# Patient Record
Sex: Female | Born: 1935
Health system: Southern US, Community
[De-identification: ages and names within clinical notes are randomized; demographics above are authoritative.]

## PROBLEM LIST (undated history)

## (undated) ENCOUNTER — Emergency Department (HOSPITAL_COMMUNITY): Payer: Medicare Other | Source: Home / Self Care

## (undated) DIAGNOSIS — N183 Chronic kidney disease, stage 3 unspecified: Secondary | ICD-10-CM

## (undated) DIAGNOSIS — I10 Essential (primary) hypertension: Secondary | ICD-10-CM

## (undated) DIAGNOSIS — I6523 Occlusion and stenosis of bilateral carotid arteries: Secondary | ICD-10-CM

## (undated) DIAGNOSIS — G3184 Mild cognitive impairment, so stated: Secondary | ICD-10-CM

## (undated) DIAGNOSIS — I2541 Coronary artery aneurysm: Secondary | ICD-10-CM

## (undated) DIAGNOSIS — I495 Sick sinus syndrome: Secondary | ICD-10-CM

## (undated) DIAGNOSIS — I6529 Occlusion and stenosis of unspecified carotid artery: Secondary | ICD-10-CM

## (undated) DIAGNOSIS — E039 Hypothyroidism, unspecified: Secondary | ICD-10-CM

## (undated) DIAGNOSIS — I739 Peripheral vascular disease, unspecified: Secondary | ICD-10-CM

## (undated) DIAGNOSIS — Z9889 Other specified postprocedural states: Secondary | ICD-10-CM

## (undated) DIAGNOSIS — Z953 Presence of xenogenic heart valve: Secondary | ICD-10-CM

## (undated) DIAGNOSIS — Z950 Presence of cardiac pacemaker: Secondary | ICD-10-CM

## (undated) DIAGNOSIS — I129 Hypertensive chronic kidney disease with stage 1 through stage 4 chronic kidney disease, or unspecified chronic kidney disease: Secondary | ICD-10-CM

## (undated) DIAGNOSIS — J4520 Mild intermittent asthma, uncomplicated: Secondary | ICD-10-CM

## (undated) DIAGNOSIS — E785 Hyperlipidemia, unspecified: Secondary | ICD-10-CM

## (undated) DIAGNOSIS — Z45010 Encounter for checking and testing of cardiac pacemaker pulse generator [battery]: Secondary | ICD-10-CM

## (undated) DIAGNOSIS — Z8679 Personal history of other diseases of the circulatory system: Secondary | ICD-10-CM

## (undated) HISTORY — DX: Encounter for checking and testing of cardiac pacemaker pulse generator (battery): Z45.010

## (undated) HISTORY — DX: Mild cognitive impairment, so stated: G31.84

## (undated) HISTORY — DX: Hypothyroidism, unspecified: E03.9

## (undated) HISTORY — DX: Sick sinus syndrome: I49.5

## (undated) HISTORY — DX: Chronic kidney disease, stage 3 (moderate): N18.3

## (undated) HISTORY — DX: Presence of cardiac pacemaker: Z95.0

## (undated) HISTORY — DX: Other specified postprocedural states: Z98.890

## (undated) HISTORY — DX: Personal history of other diseases of the circulatory system: Z86.79

## (undated) HISTORY — DX: Hypertensive chronic kidney disease with stage 1 through stage 4 chronic kidney disease, or unspecified chronic kidney disease: I12.9

## (undated) HISTORY — DX: Coronary artery aneurysm: I25.41

## (undated) HISTORY — DX: Peripheral vascular disease, unspecified: I73.9

## (undated) HISTORY — DX: Hyperlipidemia, unspecified: E78.5

## (undated) HISTORY — DX: Occlusion and stenosis of unspecified carotid artery: I65.29

## (undated) HISTORY — DX: Occlusion and stenosis of bilateral carotid arteries: I65.23

## (undated) HISTORY — DX: Presence of xenogenic heart valve: Z95.3

## (undated) HISTORY — DX: Mild intermittent asthma, uncomplicated: J45.20

## (undated) HISTORY — DX: Chronic kidney disease, stage 3 unspecified: N18.30

---
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
## 1957-02-13 HISTORY — PX: KIDNEY STONE SURGERY: SHX686

## 1964-02-14 HISTORY — PX: ABDOMINAL HYSTERECTOMY: SHX81

## 1987-12-15 HISTORY — PX: BREAST FIBROADENOMA SURGERY: SHX580

## 1997-08-19 ENCOUNTER — Ambulatory Visit (HOSPITAL_COMMUNITY): Admission: RE | Admit: 1997-08-19 | Discharge: 1997-08-19 | Payer: Self-pay | Admitting: Cardiology

## 1997-08-27 ENCOUNTER — Ambulatory Visit (HOSPITAL_COMMUNITY): Admission: RE | Admit: 1997-08-27 | Discharge: 1997-08-27 | Payer: Self-pay | Admitting: Cardiothoracic Surgery

## 1997-08-28 ENCOUNTER — Ambulatory Visit (HOSPITAL_COMMUNITY): Admission: RE | Admit: 1997-08-28 | Discharge: 1997-08-28 | Payer: Self-pay | Admitting: Cardiothoracic Surgery

## 1997-09-22 HISTORY — PX: CORONARY ARTERY BYPASS GRAFT: SHX141

## 1997-09-22 HISTORY — PX: CARDIAC SURGERY: SHX584

## 1997-09-22 HISTORY — PX: ASCENDING AORTIC ANEURYSM REPAIR: SHX1191

## 1999-09-11 ENCOUNTER — Emergency Department (HOSPITAL_COMMUNITY): Admission: EM | Admit: 1999-09-11 | Discharge: 1999-09-12 | Payer: Self-pay | Admitting: Emergency Medicine

## 1999-09-11 ENCOUNTER — Encounter: Payer: Self-pay | Admitting: Emergency Medicine

## 2001-08-26 ENCOUNTER — Encounter: Payer: Self-pay | Admitting: Cardiology

## 2001-08-26 ENCOUNTER — Ambulatory Visit (HOSPITAL_COMMUNITY): Admission: RE | Admit: 2001-08-26 | Discharge: 2001-08-27 | Payer: Self-pay | Admitting: Cardiology

## 2001-08-26 DIAGNOSIS — Z950 Presence of cardiac pacemaker: Secondary | ICD-10-CM

## 2001-08-26 HISTORY — DX: Presence of cardiac pacemaker: Z95.0

## 2001-08-26 HISTORY — PX: PACEMAKER INSERTION: SHX728

## 2001-08-27 ENCOUNTER — Encounter: Payer: Self-pay | Admitting: Cardiology

## 2002-03-25 ENCOUNTER — Ambulatory Visit (HOSPITAL_COMMUNITY): Admission: RE | Admit: 2002-03-25 | Discharge: 2002-03-25 | Payer: Self-pay | Admitting: Internal Medicine

## 2002-03-25 ENCOUNTER — Encounter: Payer: Self-pay | Admitting: Internal Medicine

## 2002-12-22 ENCOUNTER — Emergency Department (HOSPITAL_COMMUNITY): Admission: EM | Admit: 2002-12-22 | Discharge: 2002-12-22 | Payer: Self-pay | Admitting: Emergency Medicine

## 2002-12-27 ENCOUNTER — Emergency Department (HOSPITAL_COMMUNITY): Admission: EM | Admit: 2002-12-27 | Discharge: 2002-12-27 | Payer: Self-pay | Admitting: Emergency Medicine

## 2005-07-29 ENCOUNTER — Emergency Department (HOSPITAL_COMMUNITY): Admission: EM | Admit: 2005-07-29 | Discharge: 2005-07-30 | Payer: Self-pay | Admitting: Emergency Medicine

## 2008-04-28 DIAGNOSIS — I6529 Occlusion and stenosis of unspecified carotid artery: Secondary | ICD-10-CM

## 2008-04-28 HISTORY — DX: Occlusion and stenosis of unspecified carotid artery: I65.29

## 2008-05-05 ENCOUNTER — Encounter: Admission: RE | Admit: 2008-05-05 | Discharge: 2008-05-05 | Payer: Self-pay | Admitting: Internal Medicine

## 2008-11-18 ENCOUNTER — Ambulatory Visit (HOSPITAL_COMMUNITY): Admission: RE | Admit: 2008-11-18 | Discharge: 2008-11-18 | Payer: Self-pay | Admitting: Cardiology

## 2008-11-18 DIAGNOSIS — Z45010 Encounter for checking and testing of cardiac pacemaker pulse generator [battery]: Secondary | ICD-10-CM

## 2008-11-18 HISTORY — PX: PACEMAKER GENERATOR CHANGE: SHX5998

## 2008-11-18 HISTORY — DX: Encounter for checking and testing of cardiac pacemaker pulse generator (battery): Z45.010

## 2010-03-07 ENCOUNTER — Ambulatory Visit (HOSPITAL_COMMUNITY)
Admission: RE | Admit: 2010-03-07 | Discharge: 2010-03-08 | Payer: Self-pay | Source: Home / Self Care | Attending: Surgery | Admitting: Surgery

## 2010-03-07 LAB — COMPREHENSIVE METABOLIC PANEL
ALT: 13 U/L (ref 0–35)
AST: 17 U/L (ref 0–37)
Albumin: 3.4 g/dL — ABNORMAL LOW (ref 3.5–5.2)
Alkaline Phosphatase: 70 U/L (ref 39–117)
BUN: 24 mg/dL — ABNORMAL HIGH (ref 6–23)
CO2: 26 mEq/L (ref 19–32)
Calcium: 9.4 mg/dL (ref 8.4–10.5)
Chloride: 111 mEq/L (ref 96–112)
Creatinine, Ser: 1.5 mg/dL — ABNORMAL HIGH (ref 0.4–1.2)
GFR calc Af Amer: 40 mL/min — ABNORMAL LOW (ref >60)
GFR calc non Af Amer: 33 mL/min — ABNORMAL LOW (ref >60)
Glucose, Bld: 108 mg/dL — ABNORMAL HIGH (ref 70–99)
Potassium: 5 mEq/L (ref 3.5–5.1)
Sodium: 142 mEq/L (ref 135–145)
Total Bilirubin: 0.5 mg/dL (ref 0.3–1.2)
Total Protein: 6.4 g/dL (ref 6.0–8.3)

## 2010-03-07 LAB — SURGICAL PCR SCREEN
MRSA, PCR: NEGATIVE
Staphylococcus aureus: NEGATIVE

## 2010-03-07 LAB — DIFFERENTIAL
Basophils Absolute: 0 10*3/uL (ref 0.0–0.1)
Basophils Relative: 0 % (ref 0–1)
Eosinophils Absolute: 0 10*3/uL (ref 0.0–0.7)
Eosinophils Relative: 1 % (ref 0–5)
Lymphocytes Relative: 28 % (ref 12–46)
Lymphs Abs: 2 10*3/uL (ref 0.7–4.0)
Monocytes Absolute: 1 10*3/uL (ref 0.1–1.0)
Monocytes Relative: 14 % — ABNORMAL HIGH (ref 3–12)
Neutro Abs: 4 10*3/uL (ref 1.7–7.7)
Neutrophils Relative %: 57 % (ref 43–77)

## 2010-03-07 LAB — CBC
HCT: 34.3 % — ABNORMAL LOW (ref 36.0–46.0)
Hemoglobin: 10.8 g/dL — ABNORMAL LOW (ref 12.0–15.0)
MCH: 30 pg (ref 26.0–34.0)
MCHC: 31.5 g/dL (ref 30.0–36.0)
MCV: 95.3 fL (ref 78.0–100.0)
Platelets: 198 10*3/uL (ref 150–400)
RBC: 3.6 MIL/uL — ABNORMAL LOW (ref 3.87–5.11)
RDW: 13.2 % (ref 11.5–15.5)
WBC: 7 10*3/uL (ref 4.0–10.5)

## 2010-03-11 NOTE — Discharge Summary (Addendum)
  Holly Bolton, Holly Bolton                 ACCOUNT NO.:  [id number]  MEDICAL RECORD NO.:  [id number]          PATIENT TYPE:  INP  LOCATION:  5121                         FACILITY:  [facility]  PHYSICIAN:  Fenton Malling. Lucia Gaskins, M.D.  DATE OF BIRTH:  1922-07-06  DATE OF ADMISSION:  03/07/2010 DATE OF DISCHARGE:  08 Mar 2010                              DISCHARGE SUMMARY  DISCHARGE DIAGNOSES: 1. Right femoral hernia. 2. History of prior heart surgery with a pacemaker. 3. Hypertension. 4. On Ativan. 5. History of some dizziness.  OPERATIONS PERFORMED:  The patient had open repair of right femoral hernia on March 07, 2010 by Dr. Keturah Barre. Vella Colquitt.  HISTORY OF ILLNESS:  Ms. Stringfield is an 77 year old white female, patient of Dr. Jani Gravel.  From a cardiac standpoint, she was followed by Dr. Roe Rutherford until he retired and now seeing Dr. Elisabeth Cara of [facility].  She has had, I think, a mitral valve replacement in 1999, has had pacemakers twice. She has felt over the last several months some right groin pain with a bulge in the groin.  I saw her for this mass  which is  a right inguinal/femoral hernia.  On the day of admission, she was taken to the operating room where she underwent an opne right femoral hernia repair.  Because of her age and history of cardiac issues, I kept her overnight for observation, in which she did well.  She is now 1 day postop, eating, and ready for discharge.  DISCHARGE INSTRUCTIONS:   No driving for about 5 days.   She has no restrictions for her diet.   She can start showering tomorrow.   She is to call my office, and I would like to see her back in 2-3 weeks.   I am going to send her home with some Vicodin for pain, and she can resume her home medications which included Bystolic, then she has some eyedrops, some Tylenol, Benicar 40 mg daily, and then she takes lorazepam as needed.  Her discharge condition is good.    Fenton Malling. Lucia Gaskins, M.D.,  FACS   DHN/MEDQ  D:  03/08/2010  T:  03/08/2010  Job:  [id number]  cc:   Arlyss Repress, MD Tery Sanfilippo, MD  Electronically Signed by Alphonsa Overall M.D. on 03/11/2010 09:13:30 AM

## 2010-03-11 NOTE — Op Note (Addendum)
  NAMECHAMIRA, GOEGLEIN                 ACCOUNT NO.:  [id number]  MEDICAL RECORD NO.:  [id number]          PATIENT TYPE:  INP  LOCATION:  2550                         FACILITY:  [facility]  PHYSICIAN:  Fenton Malling. Lucia Gaskins, M.D.  DATE OF BIRTH:  1922/07/13  DATE OF PROCEDURE:  03/07/2010                              OPERATIVE REPORT   PREOPERATIVE DIAGNOSIS:  Right inguinal hernia.  POSTOPERATIVE DIAGNOSIS:  Right femoral hernia.  PROCEDURE:  Open repair of right femoral hernia with mesh.  SURGEON:  Fenton Malling. Lucia Gaskins, MD  FIRST ASSISTANT:  None.  ANESTHESIA:  General LMA with 30 mL of 0.25% Marcaine.  COMPLICATIONS:  None.  INDICATION FOR PROCEDURE:  Ms. Sittner is an 77 year old white female who sees Dr. Jani Gravel as her primary medical doctor, has seen Dr. Elisabeth Cara from [facility] from cardiac standpoint.  She has had increasing discomfort in the right groin with a mass that she has noticed for 6 months to a year.  This is consistent with a right inguinal hernia.  I discussed with her about repairing this hernia.  Discussed the indications and potential complications.  The potential complications include, but are not limited to, bleeding, infection, nerve injury, and recurrence of the hernia.  OPERATIVE NOTE:  The patient was placed in the supine position, given a general LMA anesthesia in room #2 at [facility].  Her abdomen was prepped with ChloraPrep, she was shaved and sterilely draped.  A time- out was held and the surgical checklist run.  I went through a right inguinal incision with sharp dissection and opened the external oblique fascia.  She did not have much of a round ligament to remove, but what she did have was a femoral hernia that protruded inferiorly through the pubo-iliac tract.  This hernia was probably protruded about 4-5 cm.  I reduced this hernia and imbricated it using 2-0 Vicryl suture.  I then carried out inguinal floor repair with a piece of  Ultrapro mesh that covers the left groin area.  I sewed this in place with interrupted 0 Novafil sutures.  The mesh was sewn to the pubic tubercle medially, the shelving edge  of the inguinal ligament inferiorly, and the internal oblique fascia superiorly.  The mesh lay flat.  The hernia defect was covered.  I then closed the external oblique fascia with interrupted 3-0 Vicryl suture.  I infiltrated the deep fascial layers, the superficial fascial layers, the subcutaneous layers with about 30 mL of 0.25% Marcaine.  I closed the subcutaneous tissues with 3-0 Vicryl sutures, the skin with a 5-0 Vicryl suture, painted with tincture, Benzoin, and steri-stripped it.  The patient tolerated the procedure well, was transported to the recovery room in good condition.  Sponge and needle counts were correct at the end of the case.   Fenton Malling. Lucia Gaskins, M.D., FACS   DHN/MEDQ  D:  03/07/2010  T:  03/07/2010  Job:  [id number]  cc:   Arlyss Repress, MD Tery Sanfilippo, MD  Electronically Signed by Alphonsa Overall M.D. on 03/11/2010 09:10:23 AM

## 2010-05-19 LAB — CBC
HCT: 31.6 % — ABNORMAL LOW (ref 36.0–46.0)
Hemoglobin: 10.8 g/dL — ABNORMAL LOW (ref 12.0–15.0)
MCHC: 34 g/dL (ref 30.0–36.0)
MCV: 95.9 fL (ref 78.0–100.0)
Platelets: 164 10*3/uL (ref 150–400)
RBC: 3.3 MIL/uL — ABNORMAL LOW (ref 3.87–5.11)
RDW: 13.8 % (ref 11.5–15.5)
WBC: 5.5 10*3/uL (ref 4.0–10.5)

## 2010-05-19 LAB — BASIC METABOLIC PANEL
BUN: 18 mg/dL (ref 6–23)
CO2: 26 mEq/L (ref 19–32)
Calcium: 8.7 mg/dL (ref 8.4–10.5)
Chloride: 109 mEq/L (ref 96–112)
Creatinine, Ser: 1.42 mg/dL — ABNORMAL HIGH (ref 0.4–1.2)
GFR calc Af Amer: 43 mL/min — ABNORMAL LOW (ref >60)
GFR calc non Af Amer: 35 mL/min — ABNORMAL LOW (ref >60)
Glucose, Bld: 101 mg/dL — ABNORMAL HIGH (ref 70–99)
Potassium: 4.2 mEq/L (ref 3.5–5.1)
Sodium: 141 mEq/L (ref 135–145)

## 2010-05-19 LAB — PROTIME-INR
INR: 1.14 (ref 0.00–1.49)
Prothrombin Time: 14.5 seconds (ref 11.6–15.2)

## 2010-05-19 LAB — APTT: aPTT: 30 seconds (ref 24–37)

## 2010-07-01 NOTE — Procedures (Signed)
[place]  Patient:    Holly Bolton, Holly Bolton Visit Number: [id number] MRN: [id number]          Service Type: CAT Location: 2000 2011 01 Attending Physician:  Lisbeth Renshaw Dictated by:   Minette Brine. Glade Lloyd, M.D. Proc. Date: 08/26/01 Admit Date:  08/26/2001 Discharge Date: 08/27/2001   CC:         Kyung Rudd C. Francina Ames., M.D.  Cardiac Catheterization Laboratory   Procedure Report  REFERRING PHYSICIAN: Theodoro Parma. Francina Ames., M.D.  PROCEDURE: Insertion of dual-chamber permanent transvenous pacemaker, DDD mode.  INDICATIONS FOR PROCEDURE: Sick sinus syndrome with tachycardia-bradycardia syndrome and syncope, pauses greater than 5 seconds.  DESCRIPTION OF PROCEDURE: After signing an informed consent, the patient was premedicated with 50 mg of Benadryl intravenously and brought to the cardiac catheterization lab.  Her right anterior chest and base of neck were prepped and draped in a sterile fashion and a right transverse subclavicular plane was anesthetized locally with 1% lidocaine.  An incision was made in this anesthetized plane with the incision being deepened into the fascial layer overlying the pectoralis muscle.  A pocket was then formed in this facial layer for later insertion of the pulse generator. Two 8 French Cook introducer sheaths were inserted percutaneously into the right subclavian vein.  A [employer] ventricular pacing lead was selected 1336T, serial #[id number] and after proper preparation inserted through the first [facility] introducer sheath. A second St. Jude lead was then selected. A screw-in lead for the atrial placement, model #1488T, serial [id number]. After properly preparing the screw-in lead, it was inserted through the second 8 French sheath in the usual fashion. Both sheaths were removed in the usual fashion. The ventricular lead was then advanced into the right atrium and right ventricle where the time  was positioned in the apex of the right ventricle. Very good pacing parameters were obtained with a minimum voltage threshold of 0.5 volts utilizing 0.9 mA of current. The resistance measured 514 ohms and the R wave sensitivity measured 5.4 mV. After obtaining these pacing parameters in the ventricle, the screw-in lead was advanced into the right atrium and a J-tip guide wire was inserted into this lead and the tip was then positioned medially in the right atrium. The appendage was no longer present as a consequence of her coronary artery bypass graft surgery. The lead was secured properly with the screw-in mechanism and good pacing parameters were obtained with a minimum voltage threshold of 1.2 volts utilizing 3.0 mA of current. The resistance measured 491 ohms, and the P wave sensitivity measured 2.5 mV. After obtaining these pacing parameters, both leads were secured properly at their insertion site. The wound was lavaged profusely with the kanamycin solution. We then selected a Medtronic pulse generator, model Kappa, model [id number], serial [id number]. After properly analyzing the pulse generator, it was attached to the pacing electrodes in the usual fashion. The pulse generator was then placed within the previously formed pocket and the wound was closed in layers using 2-0 Dexon. Final skin closure was obtained with a cutaneous layer of Steri-Strips. The patient tolerated the procedure well and no complications were noted. At the end of the procedure, a sterile bulky dressing was applied to the wound and she was returned to her wound in satisfactory condition.  MEDICATIONS GIVEN: None. The pacemaker was noted to be functioning normally the DDD mode. Dictated by:   Minette Brine. Glade Lloyd, M.D. Attending Physician:  Lisbeth Renshaw DD:  08/26/01 TD:  08/28/01 Job: [id number]

## 2011-02-23 DIAGNOSIS — E559 Vitamin D deficiency, unspecified: Secondary | ICD-10-CM | POA: Diagnosis not present

## 2011-03-02 DIAGNOSIS — I495 Sick sinus syndrome: Secondary | ICD-10-CM | POA: Diagnosis not present

## 2011-03-02 DIAGNOSIS — I10 Essential (primary) hypertension: Secondary | ICD-10-CM | POA: Diagnosis not present

## 2011-03-02 DIAGNOSIS — R079 Chest pain, unspecified: Secondary | ICD-10-CM | POA: Diagnosis not present

## 2011-03-16 DIAGNOSIS — I2510 Atherosclerotic heart disease of native coronary artery without angina pectoris: Secondary | ICD-10-CM | POA: Diagnosis not present

## 2011-03-30 DIAGNOSIS — I10 Essential (primary) hypertension: Secondary | ICD-10-CM | POA: Diagnosis not present

## 2011-04-12 DIAGNOSIS — Z45018 Encounter for adjustment and management of other part of cardiac pacemaker: Secondary | ICD-10-CM | POA: Diagnosis not present

## 2011-05-31 DIAGNOSIS — H40019 Open angle with borderline findings, low risk, unspecified eye: Secondary | ICD-10-CM | POA: Diagnosis not present

## 2011-06-01 ENCOUNTER — Emergency Department (HOSPITAL_COMMUNITY): Payer: Medicare Other

## 2011-06-01 ENCOUNTER — Encounter (HOSPITAL_COMMUNITY): Payer: Self-pay | Admitting: Emergency Medicine

## 2011-06-01 ENCOUNTER — Emergency Department (HOSPITAL_COMMUNITY)
Admission: EM | Admit: 2011-06-01 | Discharge: 2011-06-01 | Disposition: A | Discharge status: Home / Self Care | Payer: Medicare Other | Attending: Emergency Medicine | Admitting: Emergency Medicine

## 2011-06-01 DIAGNOSIS — I10 Essential (primary) hypertension: Secondary | ICD-10-CM | POA: Insufficient documentation

## 2011-06-01 DIAGNOSIS — S1093XA Contusion of unspecified part of neck, initial encounter: Secondary | ICD-10-CM | POA: Insufficient documentation

## 2011-06-01 DIAGNOSIS — R51 Headache: Secondary | ICD-10-CM | POA: Diagnosis not present

## 2011-06-01 DIAGNOSIS — S61409A Unspecified open wound of unspecified hand, initial encounter: Secondary | ICD-10-CM | POA: Insufficient documentation

## 2011-06-01 DIAGNOSIS — S0003XA Contusion of scalp, initial encounter: Secondary | ICD-10-CM | POA: Insufficient documentation

## 2011-06-01 DIAGNOSIS — Y9229 Other specified public building as the place of occurrence of the external cause: Secondary | ICD-10-CM | POA: Insufficient documentation

## 2011-06-01 DIAGNOSIS — W1809XA Striking against other object with subsequent fall, initial encounter: Secondary | ICD-10-CM | POA: Insufficient documentation

## 2011-06-01 DIAGNOSIS — M25449 Effusion, unspecified hand: Secondary | ICD-10-CM | POA: Diagnosis not present

## 2011-06-01 DIAGNOSIS — S60222A Contusion of left hand, initial encounter: Secondary | ICD-10-CM

## 2011-06-01 DIAGNOSIS — S41009A Unspecified open wound of unspecified shoulder, initial encounter: Secondary | ICD-10-CM | POA: Diagnosis not present

## 2011-06-01 DIAGNOSIS — T148XXA Other injury of unspecified body region, initial encounter: Secondary | ICD-10-CM | POA: Diagnosis not present

## 2011-06-01 DIAGNOSIS — M79609 Pain in unspecified limb: Secondary | ICD-10-CM | POA: Diagnosis not present

## 2011-06-01 DIAGNOSIS — S60229A Contusion of unspecified hand, initial encounter: Secondary | ICD-10-CM | POA: Insufficient documentation

## 2011-06-01 DIAGNOSIS — S0083XA Contusion of other part of head, initial encounter: Secondary | ICD-10-CM | POA: Diagnosis not present

## 2011-06-01 DIAGNOSIS — J45909 Unspecified asthma, uncomplicated: Secondary | ICD-10-CM | POA: Diagnosis not present

## 2011-06-01 DIAGNOSIS — G319 Degenerative disease of nervous system, unspecified: Secondary | ICD-10-CM | POA: Diagnosis not present

## 2011-06-01 DIAGNOSIS — I6789 Other cerebrovascular disease: Secondary | ICD-10-CM | POA: Diagnosis not present

## 2011-06-01 DIAGNOSIS — M7989 Other specified soft tissue disorders: Secondary | ICD-10-CM | POA: Diagnosis not present

## 2011-06-01 DIAGNOSIS — S0093XA Contusion of unspecified part of head, initial encounter: Secondary | ICD-10-CM

## 2011-06-01 DIAGNOSIS — M25539 Pain in unspecified wrist: Secondary | ICD-10-CM | POA: Diagnosis not present

## 2011-06-01 DIAGNOSIS — S6990XA Unspecified injury of unspecified wrist, hand and finger(s), initial encounter: Secondary | ICD-10-CM | POA: Diagnosis not present

## 2011-06-01 DIAGNOSIS — I6350 Cerebral infarction due to unspecified occlusion or stenosis of unspecified cerebral artery: Secondary | ICD-10-CM | POA: Diagnosis not present

## 2011-06-01 HISTORY — DX: Essential (primary) hypertension: I10

## 2011-06-01 NOTE — ED Provider Notes (Addendum)
History     CSN: [id number]  Arrival date & time 06/01/11  1700   First MD Initiated Contact with Patient 06/01/11 1710      Chief Complaint  Patient presents with  . Fall    (Consider location/radiation/quality/duration/timing/severity/associated sxs/prior treatment) HPI Comments: Patient presents after a fall at the [employer] car she started.  Patient was reaching in the freezer to get a package of [employer]ond[employer] bars and lost her balance and hit her head and her left hand.  She's been ambulating since that time.  She had no loss of consciousness.  Patient is on an aspirin but no other antiplatelet or anticoagulation agents.  She notes only mild pain at this time and does not wish to have even Tylenol for pain.  Her tetanus immunization is up-to-date.  Her primary pain is in her left hand where she does have a skin tear.  No chest pain or shortness of breath.  No palpitations.  No lightheadedness or dizziness.  Patient is a 78 y.o. female presenting with fall. The history is provided by the patient. No language interpreter was used.  Fall The accident occurred less than 1 hour ago. The fall occurred while walking. She landed on a hard floor. The volume of blood lost was minimal. The pain is mild. She was ambulatory at the scene. Associated symptoms include headaches. Pertinent negatives include no fever, no abdominal pain, no nausea and no vomiting.    Past Medical History  Diagnosis Date  . Hypertension   . Asthma     Past Surgical History  Procedure Date  . Cardiac surgery   . Pacemaker insertion     No family history on file.  History  Substance Use Topics  . Smoking status: Not on file  . Smokeless tobacco: Not on file  . Alcohol Use:     OB History    Grav Para Term Preterm Abortions TAB SAB Ect Mult Living                  Review of Systems  Constitutional: Negative.  Negative for fever and chills.  Eyes: Negative.  Negative for discharge and redness.    Respiratory: Negative.  Negative for cough and shortness of breath.   Cardiovascular: Negative.  Negative for chest pain.  Gastrointestinal: Negative.  Negative for nausea, vomiting, abdominal pain and diarrhea.  Genitourinary: Negative.  Negative for dysuria and vaginal discharge.  Musculoskeletal: Negative for back pain.  Skin: Positive for wound. Negative for color change and rash.  Neurological: Positive for headaches. Negative for syncope.  Hematological: Negative.  Negative for adenopathy.  Psychiatric/Behavioral: Negative.  Negative for confusion.  All other systems reviewed and are negative.    Allergies  Review of patient's allergies indicates no known allergies.  Home Medications  No current outpatient prescriptions on file.  BP 182/68  Pulse 100  Temp 97.7 F (36.5 C)  Resp 20  SpO2 100%  Physical Exam  Nursing note and vitals reviewed. Constitutional: She is oriented to person, place, and time. She appears well-developed and well-nourished.  Non-toxic appearance. She does not have a sickly appearance.  HENT:  Head: Normocephalic and atraumatic.    Eyes: Conjunctivae, EOM and lids are normal. Pupils are equal, round, and reactive to light. No scleral icterus.  Neck: Trachea normal and normal range of motion. Neck supple.  Cardiovascular: Normal rate, regular rhythm and normal heart sounds.   Pulmonary/Chest: Effort normal and breath sounds normal. No respiratory distress. She has  no wheezes. She has no rales. She exhibits no tenderness.  Abdominal: Soft. Normal appearance. There is no tenderness. There is no rebound, no guarding and no CVA tenderness.  Musculoskeletal: Normal range of motion. She exhibits tenderness.       Arms:      Cap refill and left fingertips is less than 2 seconds.  Sensation to light touch is intact.  Neurological: She is alert and oriented to person, place, and time. She has normal strength.  Skin: Skin is warm, dry and intact. No rash  noted.  Psychiatric: She has a normal mood and affect. Her behavior is normal. Judgment and thought content normal.    ED Course  Procedures (including critical care time)  Results for orders placed during the hospital encounter of 03/07/10  DIFFERENTIAL      Component Value Range   Neutrophils Relative 57  43 - 77 (%)   Neutro Abs 4.0  1.7 - 7.7 (K/uL)   Lymphocytes Relative 28  12 - 46 (%)   Lymphs Abs 2.0  0.7 - 4.0 (K/uL)   Monocytes Relative 14 (*) 3 - 12 (%)   Monocytes Absolute 1.0  0.1 - 1.0 (K/uL)   Eosinophils Relative 1  0 - 5 (%)   Eosinophils Absolute 0.0  0.0 - 0.7 (K/uL)   Basophils Relative 0  0 - 1 (%)   Basophils Absolute 0.0  0.0 - 0.1 (K/uL)  CBC      Component Value Range   WBC 7.0  4.0 - 10.5 (K/uL)   RBC 3.60 (*) 3.87 - 5.11 (MIL/uL)   Hemoglobin 10.8 (*) 12.0 - 15.0 (g/dL)   HCT 34.3 (*) 36.0 - 46.0 (%)   MCV 95.3  78.0 - 100.0 (fL)   MCH 30.0  26.0 - 34.0 (pg)   MCHC 31.5  30.0 - 36.0 (g/dL)   RDW 13.2  11.5 - 15.5 (%)   Platelets 198  150 - 400 (K/uL)  COMPREHENSIVE METABOLIC PANEL      Component Value Range   Sodium 142  135 - 145 (mEq/L)   Potassium 5.0  3.5 - 5.1 (mEq/L)   Chloride 111  96 - 112 (mEq/L)   CO2 26  19 - 32 (mEq/L)   Glucose, Bld 108 (*) 70 - 99 (mg/dL)   BUN 24 (*) 6 - 23 (mg/dL)   Creatinine, Ser 1.50 (*) 0.4 - 1.2 (mg/dL)   Calcium 9.4  8.4 - 10.5 (mg/dL)   Total Protein 6.4  6.0 - 8.3 (g/dL)   Albumin 3.4 (*) 3.5 - 5.2 (g/dL)   AST 17  0 - 37 (U/L)   ALT 13  0 - 35 (U/L)   Alkaline Phosphatase 70  39 - 117 (U/L)   Total Bilirubin 0.5  0.3 - 1.2 (mg/dL)   GFR calc non Af Amer 33 (*) >60 (mL/min)   GFR calc Af Amer   (*) >60 (mL/min)   Value: 40            The eGFR has been calculated     using the MDRD equation.     This calculation has not been     validated in all clinical     situations.     eGFR's persistently     <60 mL/min signify     possible Chronic Kidney Disease.  SURGICAL PCR SCREEN      Component  Value Range   MRSA, PCR NEGATIVE  NEGATIVE    Staphylococcus aureus  NEGATIVE    Value: NEGATIVE            The Xpert SA Assay (FDA     approved for NASAL specimens     only), is one component of     a comprehensive surveillance     program.  It is not intended     to diagnose infection nor to     guide or monitor treatment.   Dg Wrist Complete Left  06/01/2011  *RADIOLOGY REPORT*  Clinical Data: Fall.  Bruising and pain along the ulnar side of the wrist.  LEFT WRIST - COMPLETE 3+ VIEW  Comparison: None.  Findings: Mild soft tissue swelling along the ulnar wrist noted, without cortical discontinuity to suggest fracture.  Degenerative spurring at the first carpometacarpal articulation noted.  IMPRESSION:  1.  Mild degenerative findings at the first carpal metacarpal articulation.  No wrist fracture is observed. 2.  Mild ulnar sided soft tissue swelling.  Original Report Authenticated By: Carron Curie, M.D.   Ct Head Wo Contrast  06/01/2011  *RADIOLOGY REPORT*  Clinical Data: Fall.  Small contusion to left forehead.  The patient is on aspirin.  CT HEAD WITHOUT CONTRAST  Technique:  Contiguous axial images were obtained from the base of the skull through the vertex without contrast.  Comparison: CT of 05/05/2008  Findings: Remote infarct in the right frontal lobe is stable. Chronic microvascular ischemic changes and cerebral atrophy are stable.  Remote lacunar infarction in the right caudate head is stable.  The ventricles are normal in size.  Negative for hemorrhage, mass effect, mass lesion, or evidence of acute cortically based infarction.  The visualized paranasal sinuses, mastoid air cells, and middle ears are clear.  The skull is intact.  Soft tissues of the scalp are symmetric.  No discrete hematoma of the scalp is seen.  IMPRESSION:  1.  No acute intracranial abnormality. 2.  Remote right frontal lobe infarction and right caudate head lacunar infarction and chronic microvascular  ischemic changes.  Original Report Authenticated By: Curlene Dolphin, M.D.   Dg Hand Complete Left  06/01/2011  *RADIOLOGY REPORT*  Clinical Data: Fall.  Pain in hand.  LEFT HAND - COMPLETE 3+ VIEW  Comparison: None.  Findings: Osteoarthritis noted.  Soft tissue swelling projects over the dorsal metacarpal phalangeal joints.  No fracture is observed. Possible bone island noted in terminal phalanx of third finger.  IMPRESSION:  1.  Osteoarthritis. 2.  Dorsal soft tissue swelling overlying the metacarpal phalangeal joints.  Original Report Authenticated By: Carron Curie, M.D.     MDM  Patient presents with mechanical fall with no loss of consciousness.  Given patient's elevated age and aspirin use I will obtain a CT head since she has a very small contusion to her left upper for head.  I will obtain a left hand and wrist x-ray due to her pain, bruising and tenderness to palpation.  Her tetanus immunization is up-to-date.  Patient is declining pain medication at this time.  Lezlie Octave, MD 06/01/11 1723  Patient with no acute fractures on her x-rays.  She is in her normal mental status.  She had no loss of consciousness.  No signs of further acute injuries and I believe is safe for discharge home.  Lezlie Octave, MD 06/01/11 1840

## 2011-06-01 NOTE — ED Notes (Signed)
[id number] Expected date:06/01/11<BR> Expected time:<BR> Means of arrival:<BR> Comments:<BR> EMS 40 GC - fall/skin tear

## 2011-06-01 NOTE — Discharge Instructions (Signed)

## 2011-06-01 NOTE — ED Notes (Signed)
Pt. Reports losing her balance at [employer] and fell. Brought by [facility]. Pt. Has skin tear on left hand and bruising around. States pain in a 2 all in her R arm. Pt. Reports hitting her head and knee on the left side. Pt. Takes aspirin.

## 2011-06-30 DIAGNOSIS — Z45018 Encounter for adjustment and management of other part of cardiac pacemaker: Secondary | ICD-10-CM | POA: Diagnosis not present

## 2011-06-30 DIAGNOSIS — I2510 Atherosclerotic heart disease of native coronary artery without angina pectoris: Secondary | ICD-10-CM | POA: Diagnosis not present

## 2011-06-30 DIAGNOSIS — E782 Mixed hyperlipidemia: Secondary | ICD-10-CM | POA: Diagnosis not present

## 2011-06-30 DIAGNOSIS — Z951 Presence of aortocoronary bypass graft: Secondary | ICD-10-CM | POA: Diagnosis not present

## 2011-07-17 DIAGNOSIS — M542 Cervicalgia: Secondary | ICD-10-CM | POA: Diagnosis not present

## 2011-07-17 DIAGNOSIS — J45909 Unspecified asthma, uncomplicated: Secondary | ICD-10-CM | POA: Diagnosis not present

## 2011-07-17 DIAGNOSIS — I10 Essential (primary) hypertension: Secondary | ICD-10-CM | POA: Diagnosis not present

## 2011-10-10 DIAGNOSIS — Z45018 Encounter for adjustment and management of other part of cardiac pacemaker: Secondary | ICD-10-CM | POA: Diagnosis not present

## 2011-10-10 DIAGNOSIS — I495 Sick sinus syndrome: Secondary | ICD-10-CM | POA: Diagnosis not present

## 2011-10-18 DIAGNOSIS — I359 Nonrheumatic aortic valve disorder, unspecified: Secondary | ICD-10-CM | POA: Diagnosis not present

## 2011-10-18 DIAGNOSIS — Z954 Presence of other heart-valve replacement: Secondary | ICD-10-CM | POA: Diagnosis not present

## 2011-10-18 DIAGNOSIS — I059 Rheumatic mitral valve disease, unspecified: Secondary | ICD-10-CM | POA: Diagnosis not present

## 2011-10-18 DIAGNOSIS — Z951 Presence of aortocoronary bypass graft: Secondary | ICD-10-CM | POA: Diagnosis not present

## 2011-11-14 DIAGNOSIS — I10 Essential (primary) hypertension: Secondary | ICD-10-CM | POA: Diagnosis not present

## 2011-11-17 DIAGNOSIS — M542 Cervicalgia: Secondary | ICD-10-CM | POA: Diagnosis not present

## 2011-11-17 DIAGNOSIS — I10 Essential (primary) hypertension: Secondary | ICD-10-CM | POA: Diagnosis not present

## 2011-11-17 DIAGNOSIS — D649 Anemia, unspecified: Secondary | ICD-10-CM | POA: Diagnosis not present

## 2011-11-17 DIAGNOSIS — J45909 Unspecified asthma, uncomplicated: Secondary | ICD-10-CM | POA: Diagnosis not present

## 2011-11-30 DIAGNOSIS — Z1212 Encounter for screening for malignant neoplasm of rectum: Secondary | ICD-10-CM | POA: Diagnosis not present

## 2011-12-14 DIAGNOSIS — H409 Unspecified glaucoma: Secondary | ICD-10-CM | POA: Diagnosis not present

## 2011-12-14 DIAGNOSIS — H4011X0 Primary open-angle glaucoma, stage unspecified: Secondary | ICD-10-CM | POA: Diagnosis not present

## 2012-01-23 DIAGNOSIS — I495 Sick sinus syndrome: Secondary | ICD-10-CM | POA: Diagnosis not present

## 2012-01-23 DIAGNOSIS — Z951 Presence of aortocoronary bypass graft: Secondary | ICD-10-CM | POA: Diagnosis not present

## 2012-01-23 DIAGNOSIS — I10 Essential (primary) hypertension: Secondary | ICD-10-CM | POA: Diagnosis not present

## 2012-01-23 DIAGNOSIS — I2510 Atherosclerotic heart disease of native coronary artery without angina pectoris: Secondary | ICD-10-CM | POA: Diagnosis not present

## 2012-01-26 ENCOUNTER — Other Ambulatory Visit (HOSPITAL_COMMUNITY): Payer: Self-pay | Admitting: Cardiovascular Disease

## 2012-01-26 DIAGNOSIS — I739 Peripheral vascular disease, unspecified: Secondary | ICD-10-CM

## 2012-02-01 ENCOUNTER — Encounter (HOSPITAL_COMMUNITY): Payer: BLUE CROSS/BLUE SHIELD

## 2012-02-14 LAB — HM COLONOSCOPY

## 2012-02-15 DIAGNOSIS — I10 Essential (primary) hypertension: Secondary | ICD-10-CM | POA: Diagnosis not present

## 2012-02-15 DIAGNOSIS — D649 Anemia, unspecified: Secondary | ICD-10-CM | POA: Diagnosis not present

## 2012-02-15 DIAGNOSIS — N390 Urinary tract infection, site not specified: Secondary | ICD-10-CM | POA: Diagnosis not present

## 2012-02-15 DIAGNOSIS — E538 Deficiency of other specified B group vitamins: Secondary | ICD-10-CM | POA: Diagnosis not present

## 2012-02-20 DIAGNOSIS — I2510 Atherosclerotic heart disease of native coronary artery without angina pectoris: Secondary | ICD-10-CM | POA: Diagnosis not present

## 2012-02-20 DIAGNOSIS — Z23 Encounter for immunization: Secondary | ICD-10-CM | POA: Diagnosis not present

## 2012-02-20 DIAGNOSIS — D649 Anemia, unspecified: Secondary | ICD-10-CM | POA: Diagnosis not present

## 2012-02-20 DIAGNOSIS — I10 Essential (primary) hypertension: Secondary | ICD-10-CM | POA: Diagnosis not present

## 2012-02-28 DIAGNOSIS — I10 Essential (primary) hypertension: Secondary | ICD-10-CM | POA: Diagnosis not present

## 2012-02-28 DIAGNOSIS — N390 Urinary tract infection, site not specified: Secondary | ICD-10-CM | POA: Diagnosis not present

## 2012-04-23 DIAGNOSIS — I2510 Atherosclerotic heart disease of native coronary artery without angina pectoris: Secondary | ICD-10-CM | POA: Diagnosis not present

## 2012-04-23 DIAGNOSIS — I10 Essential (primary) hypertension: Secondary | ICD-10-CM | POA: Diagnosis not present

## 2012-04-23 DIAGNOSIS — Z951 Presence of aortocoronary bypass graft: Secondary | ICD-10-CM | POA: Diagnosis not present

## 2012-05-05 ENCOUNTER — Encounter: Payer: Self-pay | Admitting: *Deleted

## 2012-06-03 ENCOUNTER — Encounter: Payer: Self-pay | Admitting: Cardiovascular Disease

## 2012-07-25 ENCOUNTER — Other Ambulatory Visit: Payer: Self-pay | Admitting: Cardiovascular Disease

## 2012-07-25 DIAGNOSIS — I495 Sick sinus syndrome: Secondary | ICD-10-CM | POA: Diagnosis not present

## 2012-07-25 LAB — PACEMAKER DEVICE OBSERVATION

## 2012-08-02 DIAGNOSIS — T1500XA Foreign body in cornea, unspecified eye, initial encounter: Secondary | ICD-10-CM | POA: Diagnosis not present

## 2012-08-02 DIAGNOSIS — H5710 Ocular pain, unspecified eye: Secondary | ICD-10-CM | POA: Diagnosis not present

## 2012-08-09 ENCOUNTER — Encounter: Payer: Self-pay | Admitting: *Deleted

## 2012-08-09 LAB — REMOTE PACEMAKER DEVICE
AL AMPLITUDE: 1.3 mv
AL IMPEDENCE PM: 380 Ohm
AL THRESHOLD: 0.625 V
ATRIAL PACING PM: 96
BAMS-0001: 170 {beats}/min
BAMS-0003: 70 {beats}/min
BATTERY VOLTAGE: 2.93 V
DEVICE MODEL PM: 2298006
RV LEAD AMPLITUDE: 2.8 mv
RV LEAD IMPEDENCE PM: 490 Ohm
RV LEAD THRESHOLD: 0.625 V
VENTRICULAR PACING PM: 49

## 2012-09-02 DIAGNOSIS — H409 Unspecified glaucoma: Secondary | ICD-10-CM | POA: Diagnosis not present

## 2012-09-02 DIAGNOSIS — H4011X0 Primary open-angle glaucoma, stage unspecified: Secondary | ICD-10-CM | POA: Diagnosis not present

## 2012-09-18 DIAGNOSIS — Z954 Presence of other heart-valve replacement: Secondary | ICD-10-CM | POA: Diagnosis not present

## 2012-09-18 DIAGNOSIS — I079 Rheumatic tricuspid valve disease, unspecified: Secondary | ICD-10-CM | POA: Diagnosis not present

## 2012-09-18 DIAGNOSIS — I369 Nonrheumatic tricuspid valve disorder, unspecified: Secondary | ICD-10-CM | POA: Diagnosis not present

## 2012-09-18 DIAGNOSIS — I359 Nonrheumatic aortic valve disorder, unspecified: Secondary | ICD-10-CM | POA: Diagnosis not present

## 2012-09-18 DIAGNOSIS — I2789 Other specified pulmonary heart diseases: Secondary | ICD-10-CM | POA: Diagnosis not present

## 2012-10-28 ENCOUNTER — Other Ambulatory Visit: Payer: Self-pay | Admitting: Cardiovascular Disease

## 2012-10-28 DIAGNOSIS — I495 Sick sinus syndrome: Secondary | ICD-10-CM

## 2012-10-28 LAB — PACEMAKER DEVICE OBSERVATION

## 2012-11-05 ENCOUNTER — Encounter: Payer: Self-pay | Admitting: *Deleted

## 2012-11-07 LAB — REMOTE PACEMAKER DEVICE
AL AMPLITUDE: 0.9 mv
AL IMPEDENCE PM: 360 Ohm
AL THRESHOLD: 0.625 V
ATRIAL PACING PM: 97
BAMS-0001: 170 {beats}/min
BAMS-0003: 70 {beats}/min
BATTERY VOLTAGE: 2.93 V
DEVICE MODEL PM: 2298006
RV LEAD AMPLITUDE: 2.2 mv
RV LEAD IMPEDENCE PM: 480 Ohm
RV LEAD THRESHOLD: 0.625 V
VENTRICULAR PACING PM: 57

## 2012-11-14 DIAGNOSIS — Z23 Encounter for immunization: Secondary | ICD-10-CM | POA: Diagnosis not present

## 2012-11-19 ENCOUNTER — Telehealth: Payer: Self-pay | Admitting: *Deleted

## 2012-11-19 DIAGNOSIS — M79661 Pain in right lower leg: Secondary | ICD-10-CM

## 2012-11-19 NOTE — Telephone Encounter (Signed)
Patient states that she D/C'd her Amlodipine due to swelling of her lower extremities. Patient states that she has had this issue before with Norvasc, and did not realize that this was the same med. The swelling has since subsided.   Patient would also like to have the lower extremity arterial doppler that was ordered last year if it is still recommended. Will defer to [facility].

## 2012-11-20 NOTE — Telephone Encounter (Signed)
Increase Bystolic to 20 mg daily. Record BP and send [place] readings. Order the arterial Dopplers please. Follow up in 4-6 weeks, PA/ NP ok if schedule too full.

## 2012-11-22 NOTE — Telephone Encounter (Signed)
I informed pt of MC's recommendations. Patient voiced understanding, and agreed to proceed. Scheduling of tests and follow up will be deferred to schedulers.

## 2012-12-06 ENCOUNTER — Ambulatory Visit (HOSPITAL_COMMUNITY)
Admission: RE | Admit: 2012-12-06 | Discharge: 2012-12-06 | Disposition: A | Discharge status: Home / Self Care | Payer: Medicare Other | Source: Ambulatory Visit | Attending: Cardiovascular Disease | Admitting: Cardiovascular Disease

## 2012-12-06 DIAGNOSIS — M79609 Pain in unspecified limb: Secondary | ICD-10-CM | POA: Diagnosis not present

## 2012-12-06 DIAGNOSIS — M79661 Pain in right lower leg: Secondary | ICD-10-CM

## 2012-12-06 DIAGNOSIS — I70209 Unspecified atherosclerosis of native arteries of extremities, unspecified extremity: Secondary | ICD-10-CM | POA: Insufficient documentation

## 2012-12-06 NOTE — Progress Notes (Signed)
Lower Extremity Arterial Duplex Completed. °Brianna L Mazza,RVT °

## 2012-12-12 ENCOUNTER — Telehealth: Payer: Self-pay | Admitting: *Deleted

## 2012-12-12 DIAGNOSIS — I771 Stricture of artery: Secondary | ICD-10-CM

## 2012-12-12 NOTE — Telephone Encounter (Signed)
Order placed for yearly lower extremity bilateral doppler.

## 2012-12-12 NOTE — Telephone Encounter (Signed)
Message copied by Tressa Busman on Thu Dec 12, 2012  8:30 AM ------      Message from: Sanda Klein      Created: Thu Dec 12, 2012  7:53 AM       50-70% narrowing in left common iliac artery, does not need angioplasty/stent yet unless she has claudication. Recheck Doppler in 1 year ------

## 2013-01-02 ENCOUNTER — Telehealth: Payer: Self-pay | Admitting: Cardiovascular Disease

## 2013-01-02 MED ORDER — NEBIVOLOL HCL 10 MG PO TABS: 20.0000 mg | ORAL_TABLET | Freq: Every day | ORAL | Status: DC

## 2013-01-02 NOTE — Telephone Encounter (Signed)
Patient was placed on Bystolic 10 mg 2 daily.  Was given enough samples for 3 weeks.  Her pharmacy still does not have this medication in stock.  Do we have more samples for her?

## 2013-01-02 NOTE — Telephone Encounter (Signed)
Returned call and pt informed samples left at front desk.  Pt verbalized understanding and agreed w/ plan.    

## 2013-03-06 DIAGNOSIS — H409 Unspecified glaucoma: Secondary | ICD-10-CM | POA: Diagnosis not present

## 2013-03-06 DIAGNOSIS — H4011X0 Primary open-angle glaucoma, stage unspecified: Secondary | ICD-10-CM | POA: Diagnosis not present

## 2013-03-21 DIAGNOSIS — R059 Cough, unspecified: Secondary | ICD-10-CM | POA: Diagnosis not present

## 2013-03-21 DIAGNOSIS — I10 Essential (primary) hypertension: Secondary | ICD-10-CM | POA: Diagnosis not present

## 2013-03-21 DIAGNOSIS — R05 Cough: Secondary | ICD-10-CM | POA: Diagnosis not present

## 2013-04-16 DIAGNOSIS — I10 Essential (primary) hypertension: Secondary | ICD-10-CM | POA: Diagnosis not present

## 2013-05-06 DIAGNOSIS — I10 Essential (primary) hypertension: Secondary | ICD-10-CM | POA: Diagnosis not present

## 2013-05-07 DIAGNOSIS — I10 Essential (primary) hypertension: Secondary | ICD-10-CM | POA: Diagnosis not present

## 2013-05-23 ENCOUNTER — Encounter: Payer: Self-pay | Admitting: *Deleted

## 2013-06-02 ENCOUNTER — Telehealth: Payer: Self-pay | Admitting: *Deleted

## 2013-06-02 NOTE — Telephone Encounter (Signed)
Pt is confused to why she has not had a pacer check since December. She received a letter from [facility] that she has not had it checked. She has stated that is she confused.  [place]

## 2013-06-02 NOTE — Telephone Encounter (Signed)
Message forwarded to [place], CMA to advise

## 2013-06-02 NOTE — Telephone Encounter (Signed)
Informed patient of why letter was sent. Patient voiced opinion and understanding.

## 2013-07-04 ENCOUNTER — Ambulatory Visit (INDEPENDENT_AMBULATORY_CARE_PROVIDER_SITE_OTHER): Payer: Medicare Other | Admitting: Cardiovascular Disease

## 2013-07-04 ENCOUNTER — Encounter: Payer: Self-pay | Admitting: Cardiovascular Disease

## 2013-07-04 VITALS — BP 188/80 | HR 64 | Resp 16 | Ht 63.0 in | Wt 121.4 lb

## 2013-07-04 DIAGNOSIS — I495 Sick sinus syndrome: Secondary | ICD-10-CM | POA: Diagnosis not present

## 2013-07-04 DIAGNOSIS — I2510 Atherosclerotic heart disease of native coronary artery without angina pectoris: Secondary | ICD-10-CM

## 2013-07-04 DIAGNOSIS — E785 Hyperlipidemia, unspecified: Secondary | ICD-10-CM

## 2013-07-04 DIAGNOSIS — I10 Essential (primary) hypertension: Secondary | ICD-10-CM

## 2013-07-04 DIAGNOSIS — Z950 Presence of cardiac pacemaker: Secondary | ICD-10-CM

## 2013-07-04 DIAGNOSIS — Z9889 Other specified postprocedural states: Secondary | ICD-10-CM | POA: Diagnosis not present

## 2013-07-04 DIAGNOSIS — I441 Atrioventricular block, second degree: Secondary | ICD-10-CM

## 2013-07-04 LAB — PACEMAKER DEVICE OBSERVATION

## 2013-07-04 NOTE — Patient Instructions (Addendum)
Remote monitoring is used to monitor your pacemaker from home. This monitoring reduces the number of office visits required to check your device to one time per year. It allows [place] to keep an eye on the functioning of your device to ensure it is working properly. You are scheduled for a device check from home on 10-06-2013. You may send your transmission at any time that day. If you have a wireless device, the transmission will be sent automatically. After your physician reviews your transmission, you will receive a postcard with your next transmission date.  Your physician recommends that you schedule a follow-up appointment in: 12 months with Dr.Croitoru

## 2013-07-08 ENCOUNTER — Encounter: Payer: Self-pay | Admitting: Cardiovascular Disease

## 2013-07-08 DIAGNOSIS — Z9889 Other specified postprocedural states: Secondary | ICD-10-CM | POA: Insufficient documentation

## 2013-07-08 DIAGNOSIS — I495 Sick sinus syndrome: Secondary | ICD-10-CM | POA: Insufficient documentation

## 2013-07-08 DIAGNOSIS — Z950 Presence of cardiac pacemaker: Secondary | ICD-10-CM | POA: Insufficient documentation

## 2013-07-08 DIAGNOSIS — I2510 Atherosclerotic heart disease of native coronary artery without angina pectoris: Secondary | ICD-10-CM | POA: Insufficient documentation

## 2013-07-08 DIAGNOSIS — E785 Hyperlipidemia, unspecified: Secondary | ICD-10-CM | POA: Insufficient documentation

## 2013-07-08 DIAGNOSIS — I10 Essential (primary) hypertension: Secondary | ICD-10-CM | POA: Insufficient documentation

## 2013-07-08 DIAGNOSIS — I441 Atrioventricular block, second degree: Secondary | ICD-10-CM | POA: Insufficient documentation

## 2013-07-08 NOTE — Assessment & Plan Note (Signed)
Normal device function. No permanent reprogramming changes. I am concerned about the thinness of the skin overlying the device but at this point immediate repositioning of degenerative does not appear to be necessary. There is some evidence of collateral venous circulation overlying her right shoulder suggesting that the right subclavian vein may be occluded. It may be wise to reposition the generator if there is any evidence of further thinning of the skin.

## 2013-07-08 NOTE — Assessment & Plan Note (Signed)
No symptoms of aortic stenosis or insufficiency. Last echocardiogram performed in 2011 and we'll plan to reevaluate no later than next year, sooner if she develops any complaints.

## 2013-07-08 NOTE — Assessment & Plan Note (Signed)
Blood pressure is unusually high today. Typically at home her blood pressure is much better. No changes are made today. She'll keep in on her blood pressure and report elevations.

## 2013-07-08 NOTE — Progress Notes (Signed)
Patient ID: Holly Bolton, female   DOB: January 02, 1923, 80 y.o.   MRN: [id number]     Reason for office visit CAD status post CABG, s/p AVR & ascending aortic aneurysm repair, pacemaker  Holly Bolton is an 80 year old woman with multiple cardiovascular problems. She underwent aortic root replacement with a Medtronic Freestyle root, with coronary reimplantation and a single vessel LIMA to LAD bypass in 1999 (for bicuspid valve with severe stenosis and ascending aortic aneurysm). She received a dual-chamber permanent pacemaker for sinus node dysfunction and intermittent high-grade AV block. She had a pacemaker generator change in 2010 ([address] Jude accent). She has hyperlipidemia but prefers not to take statin medication.  She has not had any serious problems since her last appointment in March of 2014. Interrogation of her pacemaker today shows normal device function with an estimated longevity of almost 7 years. There have been no major arrhythmic events. There is 96% atrial pacing and 52% ventricular pacing. Rare episodes of very brief atrial tachycardia (maximum 8 seconds) have been reported.  Does not been troubled by chest pain, dyspnea, palpitations, syncope, edema or intermittent claudication.  The skin over her pacemaker is very thin and almost translucent but slides readily over the device without suggestion of imminent skin breakdown.   Allergies  Allergen Reactions  . Accupril [Quinapril Hcl]   . Amlodipine Swelling  . Benadryl [Diphenhydramine Hcl]   . Biaxin [Clarithromycin]   . Ciprofloxacin   . Codeine   . Diovan [Valsartan]   . Medrol [Methylprednisolone]   . Morphine And Related   . Neurontin [Gabapentin]   . Penicillins     Current Outpatient Prescriptions  Medication Sig Dispense Refill  . aspirin 81 MG tablet Take 81 mg by mouth daily.      . cholecalciferol (VITAMIN D) 1000 UNITS tablet Take 2,000 Units by mouth 2 (two) times daily.      . CO ENZYME Q-10 PO Take 1 capsule  by mouth daily.      . Magnesium 250 MG TABS Take 250 mg by mouth daily.      . nebivolol (BYSTOLIC) 10 MG tablet Take 2 tablets (20 mg total) by mouth daily.  56 tablet  0  . olmesartan (BENICAR) 40 MG tablet Take 40 mg by mouth daily.      . vitamin B-12 (CYANOCOBALAMIN) 1000 MCG tablet Take 1,000 mcg by mouth daily.       No current facility-administered medications for this visit.    Past Medical History  Diagnosis Date  . Hypertension   . Asthma   . Aneurysm (arteriovenous) of coronary vessels     ascending aorta requiring bypass of the LAD  . Presence of permanent cardiac pacemaker 08/26/01    Sinus node dysfunction-St.Jude  . Pacemaker generator end of life 11/18/08    Intermittent high-grade atrioventricular block  . Hyperlipemia   . Carotid stenosis 04/28/08    Doppler: <40% stenosis bilateral    Past Surgical History  Procedure Laterality Date  . Cardiac surgery    . Pacemaker insertion  08/26/01    St.Jude  . Pacemaker generator change  11/18/08    St.Jude  . Ascending aortic aneurysm repair  09/22/97    porcine aortic root  . Coronary artery bypass graft  09/22/97    LIMA to the LAD  . Abdominal hysterectomy  1966  . Kidney stone surgery  1959    left  . Nephrectomy  1958    right  . Ovarian cyst surgery  1948  . Appendectomy  1943  . Breast fibroadenoma surgery  11/89    Family History  Problem Relation Age of Onset  . Heart attack Mother   . Heart attack Father   . Heart attack Brother   . Hyperlipidemia Sister   . Hypertension Sister     History   Social History  . Marital Status: Widowed    Spouse Name: N/A    Number of Children: N/A  . Years of Education: N/A   Occupational History  . Not on file.   Social History Main Topics  . Smoking status: Never Smoker   . Smokeless tobacco: Not on file  . Alcohol Use: No  . Drug Use:   . Sexual Activity:    Other Topics Concern  . Not on file   Social History Narrative  . No narrative on file     Review of systems: The patient specifically denies any chest pain at rest or with exertion, dyspnea at rest or with exertion, orthopnea, paroxysmal nocturnal dyspnea, syncope, palpitations, focal neurological deficits, intermittent claudication, lower extremity edema, unexplained weight gain, cough, hemoptysis or wheezing.  The patient also denies abdominal pain, nausea, vomiting, dysphagia, diarrhea, constipation, polyuria, polydipsia, dysuria, hematuria, frequency, urgency, abnormal bleeding or bruising, fever, chills, unexpected weight changes, mood swings, change in skin or hair texture, change in voice quality, auditory or visual problems, allergic reactions or rashes, new musculoskeletal complaints other than usual "aches and pains".   PHYSICAL EXAM BP 188/80  Pulse 64  Resp 16  Ht $R'5\' 3"'DO$  (1.6 m)  Wt 121 lb 6.4 oz (55.067 kg)  BMI 21.51 kg/m2  General: Alert, oriented x3, no distress Head: no evidence of trauma, PERRL, EOMI, no exophtalmos or lid lag, no myxedema, no xanthelasma; normal ears, nose and oropharynx Neck: normal jugular venous pulsations and no hepatojugular reflux; brisk carotid pulses without delay and no carotid bruits Chest: clear to auscultation, no signs of consolidation by percussion or palpation, normal fremitus, symmetrical and full respiratory excursions. Pacemaker site in the right subclavian area shows very thin skin but no redness, swelling, warmth or other suggestion of skin breakdown. Healthy sternotomy scar Cardiovascular: normal position and quality of the apical impulse, regular rhythm, normal first and widely split second heart sounds, no rubs or gallops, early peaking grade 2/6 systolic ejection murmur Abdomen: no tenderness or distention, no masses by palpation, no abnormal pulsatility or arterial bruits, normal bowel sounds, no hepatosplenomegaly Extremities: no clubbing, cyanosis or edema; 2+ radial, ulnar and brachial pulses bilaterally; 2+ right  femoral, posterior tibial and dorsalis pedis pulses; 2+ left femoral, posterior tibial and dorsalis pedis pulses; no subclavian or femoral bruits Neurological: grossly nonfocal   EKG:  atrial paced ventricular sensed right bundle branch block  Lipid Panel  October 2013 total cholesterol 192, triglycerides 88, HDL 49, LDL 125    BMET    Component Value Date/Time   NA 142 03/02/2010 1503   K 5.0 03/02/2010 1503   CL 111 03/02/2010 1503   CO2 26 03/02/2010 1503   GLUCOSE 108* 03/02/2010 1503   BUN 24* 03/02/2010 1503   CREATININE 1.50* 03/02/2010 1503   CALCIUM 9.4 03/02/2010 1503   GFRNONAA 33* 03/02/2010 1503   GFRAA  Value: 40        The eGFR has been calculated using the MDRD equation. This calculation has not been validated in all clinical situations. eGFR's persistently <60 mL/min signify possible Chronic Kidney Disease.* 03/02/2010 1503     ASSESSMENT  AND PLAN Pacemaker - dual chamber St. Jude, 2000 Normal device function. No permanent reprogramming changes. I am concerned about the thinness of the skin overlying the device but at this point immediate repositioning of degenerative does not appear to be necessary. There is some evidence of collateral venous circulation overlying her right shoulder suggesting that the right subclavian vein may be occluded. It may be wise to reposition the generator if there is any evidence of further thinning of the skin.  CAD s/p CABG (LIMA to LAD) No angina pectoris. Normal nuclear perfusion study in 2011  Hyperlipidemia No statin per patient preference  Essential hypertension Blood pressure is unusually high today. Typically at home her blood pressure is much better. No changes are made today. She'll keep in on her blood pressure and report elevations.  History of aortic root repairand bioprosthetic AVR No symptoms of aortic stenosis or insufficiency. Last echocardiogram performed in 2011 and we'll plan to reevaluate no later than next year, sooner  if she develops any complaints.   Orders Placed This Encounter  Procedures  . EKG 12-Lead   Meds ordered this encounter  Medications  . aspirin 81 MG tablet    Sig: Take 81 mg by mouth daily.    Holly Bolton  Sanda Klein, MD, [facility] [phone] office [phone] pager

## 2013-07-08 NOTE — Assessment & Plan Note (Signed)
No statin per patient preference

## 2013-07-08 NOTE — Assessment & Plan Note (Signed)
No angina pectoris. Normal nuclear perfusion study in 2011

## 2013-07-10 LAB — MDC_IDC_ENUM_SESS_TYPE_INCLINIC
Battery Remaining Longevity: 81.6 mo
Battery Voltage: 2.92 V
Brady Statistic RA Percent Paced: 96 %
Brady Statistic RV Percent Paced: 52 %
Date Time Interrogation Session: 20150522160200
Implantable Pulse Generator Model: 2110
Implantable Pulse Generator Serial Number: 2298006
Lead Channel Impedance Value: 362.5 Ohm
Lead Channel Impedance Value: 475 Ohm
Lead Channel Pacing Threshold Amplitude: 0.625 V
Lead Channel Pacing Threshold Amplitude: 0.75 V
Lead Channel Pacing Threshold Pulse Width: 0.5 ms
Lead Channel Pacing Threshold Pulse Width: 0.5 ms
Lead Channel Sensing Intrinsic Amplitude: 1.6 mV
Lead Channel Sensing Intrinsic Amplitude: 2.3 mV
Lead Channel Setting Pacing Amplitude: 1 V
Lead Channel Setting Pacing Amplitude: 1.625
Lead Channel Setting Pacing Pulse Width: 0.5 ms
Lead Channel Setting Sensing Sensitivity: 1 mV

## 2013-08-01 ENCOUNTER — Encounter: Payer: Self-pay | Admitting: Cardiovascular Disease

## 2013-08-01 DIAGNOSIS — I10 Essential (primary) hypertension: Secondary | ICD-10-CM | POA: Diagnosis not present

## 2013-08-21 DIAGNOSIS — M810 Age-related osteoporosis without current pathological fracture: Secondary | ICD-10-CM | POA: Diagnosis not present

## 2013-08-21 DIAGNOSIS — I10 Essential (primary) hypertension: Secondary | ICD-10-CM | POA: Diagnosis not present

## 2013-09-04 DIAGNOSIS — H409 Unspecified glaucoma: Secondary | ICD-10-CM | POA: Diagnosis not present

## 2013-09-04 DIAGNOSIS — H4011X0 Primary open-angle glaucoma, stage unspecified: Secondary | ICD-10-CM | POA: Diagnosis not present

## 2013-10-06 ENCOUNTER — Ambulatory Visit (INDEPENDENT_AMBULATORY_CARE_PROVIDER_SITE_OTHER): Payer: Medicare Other | Admitting: *Deleted

## 2013-10-06 DIAGNOSIS — I495 Sick sinus syndrome: Secondary | ICD-10-CM

## 2013-10-06 LAB — MDC_IDC_ENUM_SESS_TYPE_REMOTE
Battery Remaining Longevity: 62 mo
Battery Remaining Percentage: 53 %
Battery Voltage: 2.9 V
Brady Statistic AP VP Percent: 66 %
Brady Statistic AP VS Percent: 31 %
Brady Statistic AS VP Percent: 1 %
Brady Statistic AS VS Percent: 2.4 %
Brady Statistic RA Percent Paced: 96 %
Brady Statistic RV Percent Paced: 66 %
Date Time Interrogation Session: 20150824142649
Implantable Pulse Generator Model: 2110
Implantable Pulse Generator Serial Number: 2298006
Lead Channel Impedance Value: 350 Ohm
Lead Channel Impedance Value: 460 Ohm
Lead Channel Pacing Threshold Amplitude: 0.625 V
Lead Channel Pacing Threshold Amplitude: 0.875 V
Lead Channel Pacing Threshold Pulse Width: 0.5 ms
Lead Channel Pacing Threshold Pulse Width: 0.5 ms
Lead Channel Sensing Intrinsic Amplitude: 0.9 mV
Lead Channel Sensing Intrinsic Amplitude: 2.2 mV
Lead Channel Setting Pacing Amplitude: 1.125
Lead Channel Setting Pacing Amplitude: 1.625
Lead Channel Setting Pacing Pulse Width: 0.5 ms
Lead Channel Setting Sensing Sensitivity: 1 mV

## 2013-10-06 NOTE — Progress Notes (Signed)
Remote pacemaker transmission.   

## 2013-10-14 ENCOUNTER — Encounter: Payer: Self-pay | Admitting: Cardiology

## 2013-10-17 ENCOUNTER — Encounter: Payer: Self-pay | Admitting: Cardiovascular Disease

## 2013-11-07 ENCOUNTER — Encounter (HOSPITAL_COMMUNITY): Payer: Self-pay | Admitting: *Deleted

## 2013-11-20 DIAGNOSIS — Z23 Encounter for immunization: Secondary | ICD-10-CM | POA: Diagnosis not present

## 2013-12-10 ENCOUNTER — Ambulatory Visit (HOSPITAL_COMMUNITY)
Admission: RE | Admit: 2013-12-10 | Discharge: 2013-12-10 | Disposition: A | Discharge status: Home / Self Care | Payer: Medicare Other | Source: Ambulatory Visit | Attending: Cardiovascular Disease | Admitting: Cardiovascular Disease

## 2013-12-10 DIAGNOSIS — I771 Stricture of artery: Secondary | ICD-10-CM

## 2013-12-10 NOTE — Progress Notes (Signed)
Lower Extremity Arterial Duplex Completed. °Brianna L Mazza,RVT °

## 2013-12-16 ENCOUNTER — Telehealth: Payer: Self-pay | Admitting: *Deleted

## 2013-12-16 DIAGNOSIS — I739 Peripheral vascular disease, unspecified: Secondary | ICD-10-CM

## 2013-12-16 NOTE — Telephone Encounter (Signed)
LE doppler results called to patient.  Order placed for repeat study in one year.  Patient voiced understanding.

## 2013-12-16 NOTE — Telephone Encounter (Signed)
-----   Message from Sanda Klein, MD sent at 12/15/2013  4:43 PM EST ----- Iliac artery blockage unchanged. Repeat in 12 months

## 2013-12-31 DIAGNOSIS — I10 Essential (primary) hypertension: Secondary | ICD-10-CM | POA: Diagnosis not present

## 2014-01-05 DIAGNOSIS — H4011X1 Primary open-angle glaucoma, mild stage: Secondary | ICD-10-CM | POA: Diagnosis not present

## 2014-01-05 DIAGNOSIS — H524 Presbyopia: Secondary | ICD-10-CM | POA: Diagnosis not present

## 2014-01-07 ENCOUNTER — Ambulatory Visit (INDEPENDENT_AMBULATORY_CARE_PROVIDER_SITE_OTHER): Payer: Medicare Other | Admitting: *Deleted

## 2014-01-07 DIAGNOSIS — I495 Sick sinus syndrome: Secondary | ICD-10-CM | POA: Diagnosis not present

## 2014-01-07 NOTE — Progress Notes (Signed)
Remote pacemaker transmission.   

## 2014-01-10 LAB — MDC_IDC_ENUM_SESS_TYPE_REMOTE
Battery Remaining Longevity: 56 mo
Battery Remaining Percentage: 48 %
Battery Voltage: 2.89 V
Brady Statistic AP VP Percent: 68 %
Brady Statistic AP VS Percent: 29 %
Brady Statistic AS VP Percent: 1 %
Brady Statistic AS VS Percent: 2.3 %
Brady Statistic RA Percent Paced: 96 %
Brady Statistic RV Percent Paced: 69 %
Date Time Interrogation Session: 20151125152246
Implantable Pulse Generator Model: 2110
Implantable Pulse Generator Serial Number: 2298006
Lead Channel Impedance Value: 340 Ohm
Lead Channel Impedance Value: 440 Ohm
Lead Channel Pacing Threshold Amplitude: 0.5 V
Lead Channel Pacing Threshold Amplitude: 0.875 V
Lead Channel Pacing Threshold Pulse Width: 0.5 ms
Lead Channel Pacing Threshold Pulse Width: 0.5 ms
Lead Channel Sensing Intrinsic Amplitude: 0.9 mV
Lead Channel Sensing Intrinsic Amplitude: 2.2 mV
Lead Channel Setting Pacing Amplitude: 1.125
Lead Channel Setting Pacing Amplitude: 1.5 V
Lead Channel Setting Pacing Pulse Width: 0.5 ms
Lead Channel Setting Sensing Sensitivity: 1 mV

## 2014-01-14 ENCOUNTER — Encounter: Payer: Self-pay | Admitting: Cardiology

## 2014-01-19 ENCOUNTER — Encounter: Payer: Self-pay | Admitting: Cardiovascular Disease

## 2014-02-12 DIAGNOSIS — S0083XA Contusion of other part of head, initial encounter: Secondary | ICD-10-CM | POA: Diagnosis not present

## 2014-02-17 DIAGNOSIS — N390 Urinary tract infection, site not specified: Secondary | ICD-10-CM | POA: Diagnosis not present

## 2014-02-17 DIAGNOSIS — M810 Age-related osteoporosis without current pathological fracture: Secondary | ICD-10-CM | POA: Diagnosis not present

## 2014-02-17 DIAGNOSIS — I10 Essential (primary) hypertension: Secondary | ICD-10-CM | POA: Diagnosis not present

## 2014-02-20 DIAGNOSIS — E0590 Thyrotoxicosis, unspecified without thyrotoxic crisis or storm: Secondary | ICD-10-CM | POA: Diagnosis not present

## 2014-02-20 DIAGNOSIS — D649 Anemia, unspecified: Secondary | ICD-10-CM | POA: Diagnosis not present

## 2014-02-20 DIAGNOSIS — N390 Urinary tract infection, site not specified: Secondary | ICD-10-CM | POA: Diagnosis not present

## 2014-02-20 DIAGNOSIS — I10 Essential (primary) hypertension: Secondary | ICD-10-CM | POA: Diagnosis not present

## 2014-02-27 DIAGNOSIS — N390 Urinary tract infection, site not specified: Secondary | ICD-10-CM | POA: Diagnosis not present

## 2014-03-16 DIAGNOSIS — N390 Urinary tract infection, site not specified: Secondary | ICD-10-CM | POA: Diagnosis not present

## 2014-03-16 DIAGNOSIS — I10 Essential (primary) hypertension: Secondary | ICD-10-CM | POA: Diagnosis not present

## 2014-04-13 ENCOUNTER — Ambulatory Visit (INDEPENDENT_AMBULATORY_CARE_PROVIDER_SITE_OTHER): Payer: Medicare Other | Admitting: *Deleted

## 2014-04-13 DIAGNOSIS — I495 Sick sinus syndrome: Secondary | ICD-10-CM

## 2014-04-13 DIAGNOSIS — E0590 Thyrotoxicosis, unspecified without thyrotoxic crisis or storm: Secondary | ICD-10-CM | POA: Diagnosis not present

## 2014-04-13 LAB — MDC_IDC_ENUM_SESS_TYPE_REMOTE
Battery Remaining Longevity: 61 mo
Battery Remaining Percentage: 53 %
Battery Voltage: 2.9 V
Brady Statistic AP VP Percent: 55 %
Brady Statistic AP VS Percent: 36 %
Brady Statistic AS VP Percent: 1 %
Brady Statistic AS VS Percent: 8.4 %
Brady Statistic RA Percent Paced: 90 %
Brady Statistic RV Percent Paced: 55 %
Date Time Interrogation Session: 20160229153006
Implantable Pulse Generator Model: 2110
Implantable Pulse Generator Serial Number: 2298006
Lead Channel Impedance Value: 340 Ohm
Lead Channel Impedance Value: 450 Ohm
Lead Channel Pacing Threshold Amplitude: 0.5 V
Lead Channel Pacing Threshold Amplitude: 0.875 V
Lead Channel Pacing Threshold Pulse Width: 0.5 ms
Lead Channel Pacing Threshold Pulse Width: 0.5 ms
Lead Channel Sensing Intrinsic Amplitude: 0.9 mV
Lead Channel Sensing Intrinsic Amplitude: 1.9 mV
Lead Channel Setting Pacing Amplitude: 1.125
Lead Channel Setting Pacing Amplitude: 1.5 V
Lead Channel Setting Pacing Pulse Width: 0.5 ms
Lead Channel Setting Sensing Sensitivity: 1 mV

## 2014-04-14 NOTE — Progress Notes (Signed)
Remote pacemaker transmission.   

## 2014-04-23 ENCOUNTER — Encounter: Payer: Self-pay | Admitting: *Deleted

## 2014-04-24 ENCOUNTER — Encounter: Payer: Self-pay | Admitting: Cardiovascular Disease

## 2014-05-18 DIAGNOSIS — I10 Essential (primary) hypertension: Secondary | ICD-10-CM | POA: Diagnosis not present

## 2014-05-18 DIAGNOSIS — E0590 Thyrotoxicosis, unspecified without thyrotoxic crisis or storm: Secondary | ICD-10-CM | POA: Diagnosis not present

## 2014-05-21 DIAGNOSIS — E0590 Thyrotoxicosis, unspecified without thyrotoxic crisis or storm: Secondary | ICD-10-CM | POA: Diagnosis not present

## 2014-05-21 DIAGNOSIS — I10 Essential (primary) hypertension: Secondary | ICD-10-CM | POA: Diagnosis not present

## 2014-06-25 DIAGNOSIS — H3531 Nonexudative age-related macular degeneration: Secondary | ICD-10-CM | POA: Diagnosis not present

## 2014-07-07 ENCOUNTER — Encounter: Payer: Self-pay | Admitting: Cardiovascular Disease

## 2014-07-07 ENCOUNTER — Ambulatory Visit (INDEPENDENT_AMBULATORY_CARE_PROVIDER_SITE_OTHER): Payer: Medicare Other | Admitting: Cardiovascular Disease

## 2014-07-07 VITALS — BP 132/68 | HR 68 | Resp 16 | Ht 63.0 in | Wt 113.4 lb

## 2014-07-07 DIAGNOSIS — Z954 Presence of other heart-valve replacement: Secondary | ICD-10-CM | POA: Diagnosis not present

## 2014-07-07 DIAGNOSIS — I495 Sick sinus syndrome: Secondary | ICD-10-CM

## 2014-07-07 DIAGNOSIS — Z952 Presence of prosthetic heart valve: Secondary | ICD-10-CM

## 2014-07-07 DIAGNOSIS — I2510 Atherosclerotic heart disease of native coronary artery without angina pectoris: Secondary | ICD-10-CM

## 2014-07-07 DIAGNOSIS — I441 Atrioventricular block, second degree: Secondary | ICD-10-CM

## 2014-07-07 DIAGNOSIS — Z950 Presence of cardiac pacemaker: Secondary | ICD-10-CM

## 2014-07-07 LAB — CUP PACEART INCLINIC DEVICE CHECK
Battery Remaining Longevity: 67.2 mo
Battery Voltage: 2.89 V
Brady Statistic RA Percent Paced: 88 %
Brady Statistic RV Percent Paced: 52 %
Date Time Interrogation Session: 20160524134657
Lead Channel Impedance Value: 362.5 Ohm
Lead Channel Impedance Value: 475 Ohm
Lead Channel Pacing Threshold Amplitude: 0.5 V
Lead Channel Pacing Threshold Amplitude: 0.625 V
Lead Channel Pacing Threshold Pulse Width: 0.5 ms
Lead Channel Pacing Threshold Pulse Width: 0.5 ms
Lead Channel Sensing Intrinsic Amplitude: 1.5 mV
Lead Channel Sensing Intrinsic Amplitude: 2.2 mV
Lead Channel Setting Pacing Amplitude: 0.875
Lead Channel Setting Pacing Amplitude: 1.5 V
Lead Channel Setting Pacing Pulse Width: 0.5 ms
Lead Channel Setting Sensing Sensitivity: 1 mV
Pulse Gen Model: 2110
Pulse Gen Serial Number: 2298006

## 2014-07-07 NOTE — Patient Instructions (Addendum)
   Your physician has requested that you have an echocardiogram. Echocardiography is a painless test that uses sound waves to create images of your heart. It provides your doctor with information about the size and shape of your heart and how well your heart's chambers and valves are working. This procedure takes approximately one hour. There are no restrictions for this procedure.    Remote monitoring is used to monitor your Pacemaker or ICD from home. This monitoring reduces the number of office visits required to check your device to one time per year. It allows [place] to monitor the functioning of your device to ensure it is working properly. You are scheduled for a device check from home on October 08, 2014. You may send your transmission at any time that day. If you have a wireless device, the transmission will be sent automatically. After your physician reviews your transmission, you will receive a postcard with your next transmission date.  Dr. Sallyanne Kuster recommends that you schedule a follow-up appointment in: ONE YEAR.

## 2014-07-07 NOTE — Progress Notes (Signed)
Patient ID: Holly Bolton, female   DOB: 02-Jan-1923, 81 y.o.   MRN: [id number]     Cardiology Office Note   Date:  07/07/2014   ID:  Holly Bolton, DOB 1923/02/11, MRN [id number]  PCP:  Jani Gravel, MD  Cardiologist:   Sanda Klein, MD   Chief Complaint  Patient presents with  . Follow-up    No complaints of chest pain, SOB, edema or dizziness.  Cough x 2 months with clear sputum.        History of Present Illness: Holly Bolton is a 81 y.o. female who presents for CAD status post CABG, s/p AVR & ascending aortic aneurysm repair, pacemaker for SSS  Holly Bolton is an 81 year old woman with multiple cardiovascular problems. She underwent aortic root replacement with a Medtronic Freestyle root, with coronary reimplantation and a single vessel LIMA to LAD bypass in 1999 (for bicuspid valve with severe stenosis and ascending aortic aneurysm). She received a dual-chamber permanent pacemaker for sinus node dysfunction and intermittent high-grade AV block. She had a pacemaker generator change in 2010 ([address] Jude accent). She has hyperlipidemia but prefers not to take statin medication.  She had been following up with her surgeon at [facility] yearly, but has not seen him or had an echo for the last 2 years.  She has not had any serious problems since her last appointment in May of 2015, but has slowly lost weight and complains of a pain across her upper abdomen that sometimes wakes her at night. It is not exertional. Has not been troubled by dyspnea, palpitations, syncope, edema or intermittent claudication. Generally does not feel well.  Interrogation of her pacemaker today shows normal device function with an estimated longevity of 5.5 years. There have been no major arrhythmic events. There is 88% atrial pacing and 52% ventricular pacing. Rare episodes of very brief atrial tachycardia (mostly 4-8 seconds, a single episode lasting 2-3 minutes in February) have been recorded. As before, she has rather  low voltage R waves (2.0-2.5 mV).  The skin over her pacemaker is very thin and almost translucent but slides readily over the device without suggestion of imminent skin breakdown.    Past Medical History  Diagnosis Date  . Hypertension   . Asthma   . Aneurysm (arteriovenous) of coronary vessels     ascending aorta requiring bypass of the LAD  . Presence of permanent cardiac pacemaker 08/26/01    Sinus node dysfunction-St.Jude  . Pacemaker generator end of life 11/18/08    Intermittent high-grade atrioventricular block  . Hyperlipemia   . Carotid stenosis 04/28/08    Doppler: <40% stenosis bilateral    Past Surgical History  Procedure Laterality Date  . Cardiac surgery    . Pacemaker insertion  08/26/01    St.Jude  . Pacemaker generator change  11/18/08    St.Jude  . Ascending aortic aneurysm repair  09/22/97    porcine aortic root  . Coronary artery bypass graft  09/22/97    LIMA to the LAD  . Abdominal hysterectomy  1966  . Kidney stone surgery  1959    left  . Nephrectomy  1958    right  . Ovarian cyst surgery  1948  . Appendectomy  1943  . Breast fibroadenoma surgery  11/89     Current Outpatient Prescriptions  Medication Sig Dispense Refill  . aspirin 81 MG tablet Take 81 mg by mouth daily.    . cholecalciferol (VITAMIN D) 1000 UNITS tablet Take 2,000 Units by  mouth 2 (two) times daily.    . CO ENZYME Q-10 PO Take 1 capsule by mouth daily.    Marland Kitchen losartan (COZAAR) 50 MG tablet Take 50 mg by mouth daily.  3  . Magnesium 250 MG TABS Take 250 mg by mouth daily.    . metoprolol tartrate (LOPRESSOR) 25 MG tablet Take 25 mg by mouth daily.  3  . vitamin B-12 (CYANOCOBALAMIN) 1000 MCG tablet Take 1,000 mcg by mouth daily.     No current facility-administered medications for this visit.    Allergies:   Accupril; Amlodipine; Benadryl; Biaxin; Ciprofloxacin; Codeine; Diovan; Medrol; Morphine and related; Neurontin; and Penicillins    Social History:  The patient  reports  that she has never smoked. She does not have any smokeless tobacco history on file. She reports that she does not drink alcohol.   Family History:  The patient's family history includes Heart attack in her brother, father, and mother; Hyperlipidemia in her sister; Hypertension in her sister.    ROS:  Please see the history of present illness.    Otherwise, review of systems positive for muscle cramps.   All other systems are reviewed and negative.    PHYSICAL EXAM: VS:  BP 132/68 mmHg  Pulse 68  Resp 16  Ht 5\' 3"  (1.6 m)  Wt 51.438 kg (113 lb 6.4 oz)  BMI 20.09 kg/m2 , BMI Body mass index is 20.09 kg/(m^2).  General: Alert, oriented x3, no distress Head: no evidence of trauma, PERRL, EOMI, no exophtalmos or lid lag, no myxedema, no xanthelasma; normal ears, nose and oropharynx Neck: normal jugular venous pulsations and no hepatojugular reflux; brisk carotid pulses without delay and no carotid bruits Chest: clear to auscultation, no signs of consolidation by percussion or palpation, normal fremitus, symmetrical and full respiratory excursions. Pacemaker site in the right subclavian area shows very thin skin but no redness, swelling, warmth or other suggestion of skin breakdown. Healthy sternotomy scar Cardiovascular: normal position and quality of the apical impulse, regular rhythm, normal first and widely split second heart sounds, no rubs or gallops, early peaking grade 2/6 systolic ejection murmur Abdomen: no tenderness or distention, no masses by palpation, no abnormal pulsatility or arterial bruits, normal bowel sounds, no hepatosplenomegaly Extremities: no clubbing, cyanosis or edema; 2+ radial, ulnar and brachial pulses bilaterally; 2+ right femoral, posterior tibial and dorsalis pedis pulses; 2+ left femoral, posterior tibial and dorsalis pedis pulses; no subclavian or femoral bruits Neurological: grossly nonfocal Psych: euthymic mood, full affect  EKG:  EKG is ordered today. The  ekg ordered today demonstrates NSR/atrial paced, long AV delay, RBBB, LAFB    Wt Readings from Last 3 Encounters:  07/07/14 51.438 kg (113 lb 6.4 oz)  07/04/13 55.067 kg (121 lb 6.4 oz)     ASSESSMENT AND PLAN:  Pacemaker - dual chamber St. Jude, gen change 2010 Normal device function. No permanent reprogramming changes. She is not pacemaker dependent. Not much change at the surgical site. I am concerned about the thinness of the skin overlying the device but at this point immediate repositioning of the generator does not appear to be necessary. There is some evidence of collateral venous circulation overlying her right shoulder suggesting that the right subclavian vein may be occluded. It may be wise to reposition the generator if there is any evidence of further thinning of the skin.  CAD s/p CABG (LIMA to LAD) She is now 17 years status post single-vessel LIMA to LAD. No clear angina pectoris. Normal  nuclear perfusion study in 2011, no evaluation since. I'm not sure whether her epigastric discomfort is anginal. It occurs at rest. She is reluctant to undergo any perfusion study since she had such a bad experience with dipyridamole 5 years ago. I advised that we are now using a different agent with a lower side effect profile but she is still reluctant. Will agree to undergo perfusion study if her echocardiogram shows regional wall motion abnormalities or depressed left ventricular ejection fraction  Hyperlipidemia No statin per patient preference.  Essential hypertension Blood pressure is unusually high today when she first checked in, but normalized by the time I examined her. No changes are made today. She'll keep in on her blood pressure and report elevations.  History of aortic root repair and bioprosthetic AVR No symptoms of aortic stenosis or insufficiency, but she has atypical epigastric discomfort.. Last echocardiogram performed in our center in 2011 and we'll plan to reevaluate  first available. I'm not sure what the protocol for follow-up of her ascending aorta should be at age almost 68 and 77 years status post aortic root repair.   Current medicines are reviewed at length with the patient today.  The patient has concerns regarding medicines. Wonders if the generic versions of her meds may be the reason she feels unwell (has trouble describing this feeling). I told her that I think this is doubtful  The following changes have been made:  no change  Labs/ tests ordered today include:   Orders Placed This Encounter  Procedures  . Implantable device check  . EKG 12-Lead  . ECHOCARDIOGRAM COMPLETE   Patient Instructions    Your physician has requested that you have an echocardiogram. Echocardiography is a painless test that uses sound waves to create images of your heart. It provides your doctor with information about the size and shape of your heart and how well your heart's chambers and valves are working. This procedure takes approximately one hour. There are no restrictions for this procedure.    Remote monitoring is used to monitor your Pacemaker or ICD from home. This monitoring reduces the number of office visits required to check your device to one time per year. It allows [place] to monitor the functioning of your device to ensure it is working properly. You are scheduled for a device check from home on October 08, 2014. You may send your transmission at any time that day. If you have a wireless device, the transmission will be sent automatically. After your physician reviews your transmission, you will receive a postcard with your next transmission date.  Dr. Sallyanne Kuster recommends that you schedule a follow-up appointment in: ONE YEAR.       Mikael Spray, MD  07/07/2014 3:06 PM    Sanda Klein, MD, [facility] [phone] office [phone] pager

## 2014-07-09 ENCOUNTER — Ambulatory Visit (HOSPITAL_COMMUNITY)
Admission: RE | Admit: 2014-07-09 | Discharge: 2014-07-09 | Disposition: A | Discharge status: Home / Self Care | Payer: Medicare Other | Source: Ambulatory Visit | Attending: Cardiology | Admitting: Cardiology

## 2014-07-09 DIAGNOSIS — I359 Nonrheumatic aortic valve disorder, unspecified: Secondary | ICD-10-CM | POA: Diagnosis present

## 2014-07-09 DIAGNOSIS — Z954 Presence of other heart-valve replacement: Secondary | ICD-10-CM | POA: Diagnosis not present

## 2014-07-09 DIAGNOSIS — I071 Rheumatic tricuspid insufficiency: Secondary | ICD-10-CM | POA: Insufficient documentation

## 2014-07-09 DIAGNOSIS — Z952 Presence of prosthetic heart valve: Secondary | ICD-10-CM

## 2014-07-14 ENCOUNTER — Telehealth: Payer: Self-pay

## 2014-07-14 MED ORDER — FUROSEMIDE 20 MG PO TABS: 20.0000 mg | ORAL_TABLET | Freq: Every day | ORAL | Status: DC

## 2014-07-14 NOTE — Telephone Encounter (Signed)
-----   Message from Sanda Klein, MD sent at 07/14/2014  9:20 AM EDT ----- Please add furosemide 20 mg daily. If no improvement in 4-5 days, would again recommend that she have a [employer]

## 2014-07-14 NOTE — Telephone Encounter (Signed)
Called patient to advise of medication changes, patient understood and agreed with plan. Prescription sent to pharmacy electronically.  Patient questioned how would she know if she would feel better. Patient doesn't report any shortness of breath, does report weakness. Advised her to start medication to see how she feels. Will defer to [facility] regarding symptoms.

## 2014-07-16 ENCOUNTER — Telehealth: Payer: Self-pay | Admitting: Cardiovascular Disease

## 2014-07-16 NOTE — Telephone Encounter (Signed)
Please call,concerning her medicine.Pt have some concerns about the Furosemide.

## 2014-07-16 NOTE — Telephone Encounter (Signed)
Questions patient had concerning starting furosemide were addressed on the phone.   She just got prescription. She wanted to know if switching back to Benicar from Cozaar was advised - thinks she did better on Benicar. Was switched by PCP. I don't see notes on this. Advised probably good idea to start on furosemide if she has it, see if med helps -advised start of furosemide, BP log, call back in ~2 weeks w/ report of BPs, other concerns - she verbalized understanding/agreement w/ plan.

## 2014-07-27 ENCOUNTER — Telehealth: Payer: Self-pay | Admitting: Cardiovascular Disease

## 2014-07-27 DIAGNOSIS — Z79899 Other long term (current) drug therapy: Secondary | ICD-10-CM

## 2014-07-27 DIAGNOSIS — R531 Weakness: Secondary | ICD-10-CM | POA: Diagnosis not present

## 2014-07-27 DIAGNOSIS — I509 Heart failure, unspecified: Secondary | ICD-10-CM | POA: Diagnosis not present

## 2014-07-27 NOTE — Telephone Encounter (Signed)
Called patient to discuss her symptoms.  Patient reports ongoing muscle cramps (mainly in legs), "really weak" x several days. No dyspnea, no syncope, no other pain.  At the request of her son, this morning she sought workup at PCP - [facility]. She was seen by Vassie Moment PA.  She requested notes/reports from that visit be sent to [place].  Bloodwork was drawn. Concern was possible hypokalemia - she started on her lasix following visit w/ Dr. Loletha Grayer about 2 weeks ago. She is not on supplementary oral potassium, was not started today. PA did cut her dose of furosemide to 10mg  daily. I recommended introduction of bananas, potatoes, other potassium rich foods, see if this helps.  Informed pt I would f/u w/ staff at [facility] in AM to see if results from bloodwork available - we have not, as of 5:30pm 6/13, received fax from their office.   Routing to Dr. Sallyanne Kuster for any additional advice and to myself for AM f/u. Will contact pt back tomorrow.

## 2014-07-27 NOTE — Telephone Encounter (Signed)
Holly Bolton, is calling because she is on lasik and cannot get around . Stating that she is real weak .Please call    Thanks

## 2014-07-28 ENCOUNTER — Encounter: Payer: Self-pay | Admitting: Cardiovascular Disease

## 2014-07-28 ENCOUNTER — Telehealth: Payer: Self-pay | Admitting: Cardiovascular Disease

## 2014-07-28 MED ORDER — FUROSEMIDE 20 MG PO TABS: 20.0000 mg | ORAL_TABLET | ORAL | Status: DC

## 2014-07-28 NOTE — Telephone Encounter (Signed)
Advised pt on instruction given by Dr. Sallyanne Kuster - she voiced understanding. Order submitted for repeat BMET. Med list updated.

## 2014-07-28 NOTE — Telephone Encounter (Addendum)
Outgoing call to patient. She has not received instruction/results from [facility]. Advised her to call, see if able to speak to nurse.  Did give her our fax number. Will wait on report then route back to Dr. Sallyanne Kuster when new information received from either patient or other practice.  Pt does report feeling improvement today w/ symptoms. Acknowledged lasix dose change.

## 2014-07-28 NOTE — Telephone Encounter (Signed)
Please stop taking furosemide as a daily medicine. Skip for next three days, then take only twice weekly, for example, Friday and Monday. Recheck BMET in 2 weeks.

## 2014-07-28 NOTE — Telephone Encounter (Signed)
Any labs yet?

## 2014-07-28 NOTE — Telephone Encounter (Signed)
Results from BMP received from [place]. Currently being scanned by Medical Records.  Gluc    101 Creat   2.1 BUN  47 eGRF 23.53 NA 138 K 4.9 CL 102 CO2 26 CA 9.3   Note electrolytes, glucose WNR. No additional labwork. Pt wanted me to call back w/ advice.  Dr. Sallyanne Kuster - do you want to see her sooner than her f/u visit 8/15? Any recommendations for her.

## 2014-07-28 NOTE — Telephone Encounter (Signed)
Received records from [facility] for appointment with Dr Sallyanne Kuster on 09/28/14.  Records given to [facility] (medical records) for Dr Croitoru's schedule on 09/28/14.

## 2014-07-28 NOTE — Telephone Encounter (Signed)
Left message w/ [facility] nurse to return call.

## 2014-07-30 ENCOUNTER — Encounter: Payer: Self-pay | Admitting: Cardiovascular Disease

## 2014-08-07 ENCOUNTER — Telehealth: Payer: Self-pay | Admitting: Cardiovascular Disease

## 2014-08-07 NOTE — Telephone Encounter (Signed)
Pt's son was calling in because his mother had requested that her results from her Echo be mailed to her house and she had not received them yet. Can these be mailed again. The pt's address is still current.   Thanks

## 2014-08-07 NOTE — Telephone Encounter (Signed)
Copy of recent echo placed in the mailed to patients home address.

## 2014-08-10 ENCOUNTER — Other Ambulatory Visit: Payer: Self-pay

## 2014-08-13 DIAGNOSIS — I10 Essential (primary) hypertension: Secondary | ICD-10-CM | POA: Diagnosis not present

## 2014-08-13 DIAGNOSIS — E0590 Thyrotoxicosis, unspecified without thyrotoxic crisis or storm: Secondary | ICD-10-CM | POA: Diagnosis not present

## 2014-08-20 DIAGNOSIS — E0590 Thyrotoxicosis, unspecified without thyrotoxic crisis or storm: Secondary | ICD-10-CM | POA: Diagnosis not present

## 2014-08-20 DIAGNOSIS — I10 Essential (primary) hypertension: Secondary | ICD-10-CM | POA: Diagnosis not present

## 2014-08-20 DIAGNOSIS — R05 Cough: Secondary | ICD-10-CM | POA: Diagnosis not present

## 2014-08-20 DIAGNOSIS — R040 Epistaxis: Secondary | ICD-10-CM | POA: Diagnosis not present

## 2014-08-25 DIAGNOSIS — R040 Epistaxis: Secondary | ICD-10-CM | POA: Diagnosis not present

## 2014-09-28 ENCOUNTER — Ambulatory Visit (INDEPENDENT_AMBULATORY_CARE_PROVIDER_SITE_OTHER): Payer: Medicare Other | Admitting: Cardiovascular Disease

## 2014-09-28 ENCOUNTER — Encounter: Payer: Self-pay | Admitting: Cardiovascular Disease

## 2014-09-28 VITALS — BP 182/56 | HR 78 | Resp 16 | Ht 62.0 in | Wt 110.0 lb

## 2014-09-28 DIAGNOSIS — I495 Sick sinus syndrome: Secondary | ICD-10-CM

## 2014-09-28 DIAGNOSIS — Z9889 Other specified postprocedural states: Secondary | ICD-10-CM

## 2014-09-28 DIAGNOSIS — I2510 Atherosclerotic heart disease of native coronary artery without angina pectoris: Secondary | ICD-10-CM

## 2014-09-28 DIAGNOSIS — Z950 Presence of cardiac pacemaker: Secondary | ICD-10-CM | POA: Diagnosis not present

## 2014-09-28 DIAGNOSIS — I441 Atrioventricular block, second degree: Secondary | ICD-10-CM

## 2014-09-28 DIAGNOSIS — R5381 Other malaise: Secondary | ICD-10-CM

## 2014-09-28 DIAGNOSIS — I10 Essential (primary) hypertension: Secondary | ICD-10-CM

## 2014-09-28 DIAGNOSIS — Z8679 Personal history of other diseases of the circulatory system: Secondary | ICD-10-CM

## 2014-09-28 HISTORY — DX: Personal history of other diseases of the circulatory system: Z86.79

## 2014-09-28 NOTE — Patient Instructions (Signed)
A referral has been sent for out patient Physical Therapy to help you build up strength.  Remote monitoring is used to monitor your Pacemaker from home. This monitoring reduces the number of office visits required to check your device to one time per year. It allows [place] to monitor the functioning of your device to ensure it is working properly. You are scheduled for a device check from home on December 30, 2014. You may send your transmission at any time that day. If you have a wireless device, the transmission will be sent automatically. After your physician reviews your transmission, you will receive a postcard with your next transmission date.  Dr. Sallyanne Kuster recommends that you schedule a follow-up appointment in: One year.

## 2014-09-28 NOTE — Progress Notes (Signed)
Patient ID: Holly Bolton, female   DOB: 10-24-1922, 81 y.o.   MRN: [id number]     Cardiology Office Note   Date:  09/30/2014   ID:  Holly Bolton, DOB November 17, 1922, MRN [id number]  PCP:  Holly Gravel, MD  Cardiologist:   Sanda Klein, MD   Chief Complaint  Patient presents with  . Follow-up    dizzy sometimes  . Edema    ankles  . Shortness of Breath    sometimes      History of Present Illness: Holly Bolton is a 81 y.o. female who presents for CAD status post CABG, s/p AVR & ascending aortic aneurysm repair, pacemaker for SSS  [name] has multiple chronic cardiovascular problems. She is accompanied today by her son. She underwent aortic root replacement with a Medtronic Freestyle root, with coronary reimplantation and a single vessel LIMA to LAD bypass in 1999 (for bicuspid valve with severe stenosis and ascending aortic aneurysm). She received a dual-chamber permanent pacemaker for sinus node dysfunction and intermittent high-grade AV block. She had a pacemaker generator change in 2010 ([address] Jude accent). She has hyperlipidemia but prefers not to take statin medication.  She continues to complain of fatigue and generally "not feeling well" but denies exertional dyspnea or angina. She does not like to be physically active, despite the encouragement of her son.  Repeat echocardiogram performed a couple of months ago showed normal function of her aortic bioprosthesis and normal left ventricular systolic function. The aorta does not appear to be dilated.  Interrogation of her dual-chamber permanent pacemaker shows normal device function with an estimated generator longevity of roughly 5 years. There is a 9% atrial pacing with appropriate heart rate histogram distribution. There is also 46% ventricular pacing. Very rare episodes of brief atrial tachycardia are seen lasting no more than 6 seconds in duration.    Past Medical History  Diagnosis Date  . Hypertension   . Asthma   .  Aneurysm (arteriovenous) of coronary vessels     ascending aorta requiring bypass of the LAD  . Presence of permanent cardiac pacemaker 08/26/01    Sinus node dysfunction-St.Jude  . Pacemaker generator end of life 11/18/08    Intermittent high-grade atrioventricular block  . Hyperlipemia   . Carotid stenosis 04/28/08    Doppler: <40% stenosis bilateral  . Status post ascending aortic aneurysm repair/AVR -  Medtronic Freestyle root 09/28/2014    ascending aorta requiring bypass of the LAD     Past Surgical History  Procedure Laterality Date  . Cardiac surgery    . Pacemaker insertion  08/26/01    St.Jude  . Pacemaker generator change  11/18/08    St.Jude  . Ascending aortic aneurysm repair  09/22/97    porcine aortic root  . Coronary artery bypass graft  09/22/97    LIMA to the LAD  . Abdominal hysterectomy  1966  . Kidney stone surgery  1959    left  . Nephrectomy  1958    right  . Ovarian cyst surgery  1948  . Appendectomy  1943  . Breast fibroadenoma surgery  11/89     Current Outpatient Prescriptions  Medication Sig Dispense Refill  . ALPHAGAN P 0.1 % SOLN Place 1 application into both eyes 2 (two) times daily.  1  . aspirin 81 MG tablet Take 81 mg by mouth daily.    . cholecalciferol (VITAMIN D) 1000 UNITS tablet Take 2,000 Units by mouth 2 (two) times daily.    Marland Kitchen  CO ENZYME Q-10 PO Take 1 capsule by mouth daily.    . furosemide (LASIX) 20 MG tablet Take 1 tablet (20 mg total) by mouth 2 (two) times a week. 30 tablet 11  . losartan (COZAAR) 50 MG tablet Take 50 mg by mouth daily.  3  . Magnesium 250 MG TABS Take 250 mg by mouth daily.    . metoprolol tartrate (LOPRESSOR) 25 MG tablet Take 25 mg by mouth daily.  3  . vitamin B-12 (CYANOCOBALAMIN) 1000 MCG tablet Take 1,000 mcg by mouth daily.     No current facility-administered medications for this visit.    Allergies:   Accupril; Amlodipine; Benadryl; Biaxin; Ciprofloxacin; Codeine; Diovan; Medrol; Morphine and related;  Neurontin; and Penicillins    Social History:  The patient  reports that she has never smoked. She does not have any smokeless tobacco history on file. She reports that she does not drink alcohol.   Family History:  The patient's family history includes Heart attack in her brother, father, and mother; Hyperlipidemia in her sister; Hypertension in her sister.    ROS:  Please see the history of present illness.    Otherwise, review of systems positive for none.   All other systems are reviewed and negative.    PHYSICAL EXAM: VS:  BP 182/56 mmHg  Pulse 78  Resp 16  Ht 5\' 2"  (1.575 m)  Wt 110 lb (49.896 kg)  BMI 20.11 kg/m2 , BMI Body mass index is 20.11 kg/(m^2). Recheck 140/61 mm Hg  General: Alert, oriented x3, no distress Head: no evidence of trauma, PERRL, EOMI, no exophtalmos or lid lag, no myxedema, no xanthelasma; normal ears, nose and oropharynx Neck: normal jugular venous pulsations and no hepatojugular reflux; brisk carotid pulses without delay and no carotid bruits Chest: clear to auscultation, no signs of consolidation by percussion or palpation, normal fremitus, symmetrical and full respiratory excursions. Pacemaker site in the right subclavian area shows very thin skin but no redness, swelling, warmth or other suggestion of skin breakdown. Healthy sternotomy scar Cardiovascular: normal position and quality of the apical impulse, regular rhythm, normal first and widely split second heart sounds, no rubs or gallops, early peaking grade 2/6 systolic ejection murmur Abdomen: no tenderness or distention, no masses by palpation, no abnormal pulsatility or arterial bruits, normal bowel sounds, no hepatosplenomegaly Extremities: no clubbing, cyanosis or edema; 2+ radial, ulnar and brachial pulses bilaterally; 2+ right femoral, posterior tibial and dorsalis pedis pulses; 2+ left femoral, posterior tibial and dorsalis pedis pulses; no subclavian or femoral bruits Neurological: grossly  nonfocal Psych: euthymic mood, full affect   EKG:  EKG is not ordered today.   Recent Labs: No results found for requested labs within last 365 days.    Lipid Panel No results found for: CHOL, TRIG, HDL, CHOLHDL, VLDL, LDLCALC, LDLDIRECT    Wt Readings from Last 3 Encounters:  09/28/14 110 lb (49.896 kg)  07/07/14 113 lb 6.4 oz (51.438 kg)  07/04/13 121 lb 6.4 oz (55.067 kg)      ASSESSMENT AND PLAN:  Pacemaker - dual chamber St. Jude, gen change 2010 Normal device function. No permanent reprogramming changes. She is not pacemaker dependent. Not much change at the surgical site. I am concerned about the thinness of the skin overlying the device but at this point immediate repositioning of the generator does not appear to be necessary. There is some evidence of collateral venous circulation overlying her right shoulder suggesting that the right subclavian vein may be occluded.  It may be wise to reposition the generator if there is any evidence of further thinning of the skin.  CAD s/p CABG (LIMA to LAD) She is now 19 years status post single-vessel LIMA to LAD. No clear angina pectoris. Normal nuclear perfusion study in 2011. Her atypical epigastric discomfort has not recurred. She has normal left ventricular systolic function. Her only bypass was a internal thoracic artery which is likely to have great long-term results. Will not proceed (at her request) with further coronary evaluation.  Hyperlipidemia No statin per patient preference.  Essential hypertension As before, Blood pressure is unusually high when she first checked in, but normalized by the time I examined her. No changes are made today. She'll keep in on her blood pressure and report elevations.  History of aortic root repair and bioprosthetic AVR No symptoms of aortic stenosis or insufficiency, but she has atypical epigastric discomfort. It is probably not necessary to perform CT angiogram follow-up of her ascending  aorta (age almost 61 and 74 years status post aortic root repair). I don't think she would be considered a candidate for repeat thoracic surgery, nor does she wish this.  I agree with her son that her current very sedentary lifestyle is this advantageous and is likely to contribute to further functional deterioration. We'll try to get a physical therapy evaluation and see if there are any exercise programs that she could benefit from.   Current medicines are reviewed at length with the patient today.  The patient does not have concerns regarding medicines.  The following changes have been made:  no change  Labs/ tests ordered today include:   Orders Placed This Encounter  Procedures  . Ambulatory referral to Physical Therapy    Patient Instructions  A referral has been sent for out patient Physical Therapy to help you build up strength.  Remote monitoring is used to monitor your Pacemaker from home. This monitoring reduces the number of office visits required to check your device to one time per year. It allows [place] to monitor the functioning of your device to ensure it is working properly. You are scheduled for a device check from home on December 30, 2014. You may send your transmission at any time that day. If you have a wireless device, the transmission will be sent automatically. After your physician reviews your transmission, you will receive a postcard with your next transmission date.  Dr. Sallyanne Kuster recommends that you schedule a follow-up appointment in: One year.         Mikael Spray, MD  09/30/2014 2:14 PM    Sanda Klein, MD, [facility] [phone] office [phone] pager

## 2014-10-02 LAB — CUP PACEART INCLINIC DEVICE CHECK
Battery Remaining Longevity: 5
Battery Remaining Percentage: 51 %
Battery Voltage: 2.87 V
Brady Statistic RA Percent Paced: 89 %
Brady Statistic RV Percent Paced: 46 %
Date Time Interrogation Session: 20160819152349
Lead Channel Impedance Value: 360 Ohm
Lead Channel Impedance Value: 460 Ohm
Lead Channel Pacing Threshold Amplitude: 0.5 V
Lead Channel Pacing Threshold Amplitude: 0.625 V
Lead Channel Pacing Threshold Pulse Width: 0.5 ms
Lead Channel Pacing Threshold Pulse Width: 0.5 ms
Lead Channel Sensing Intrinsic Amplitude: 0.9 mV
Lead Channel Sensing Intrinsic Amplitude: 2 mV
Lead Channel Setting Pacing Amplitude: 1.25 V
Lead Channel Setting Pacing Amplitude: 1.5 V
Lead Channel Setting Pacing Pulse Width: 0.5 ms
Lead Channel Setting Sensing Sensitivity: 1 mV
Pulse Gen Model: 2110
Pulse Gen Serial Number: 2298006

## 2014-10-09 DIAGNOSIS — E0590 Thyrotoxicosis, unspecified without thyrotoxic crisis or storm: Secondary | ICD-10-CM | POA: Diagnosis not present

## 2014-10-13 ENCOUNTER — Other Ambulatory Visit (HOSPITAL_COMMUNITY): Payer: Self-pay | Admitting: Endocrinology

## 2014-10-13 DIAGNOSIS — E0590 Thyrotoxicosis, unspecified without thyrotoxic crisis or storm: Secondary | ICD-10-CM

## 2014-10-15 ENCOUNTER — Telehealth: Payer: Self-pay | Admitting: Cardiovascular Disease

## 2014-10-15 NOTE — Telephone Encounter (Signed)
Patient could not remember what the doctor told her about taking her Lasix  I told her Dr. Sallyanne Kuster on the OV of 09/29/14 that she is to take it twice a week.  She said she really did not want a thyroid scan that Dr. Maudie Mercury ordered  I told her to call Dr. Maudie Mercury so she can discuss her concerns with him and her desire not to have test; he might have a compelling reason he would like to do it now  Concerned about dyes because of her sensitivity.  Instructed her to always tell who ever is ordering the test and prior to receiving any dyes about her sensitivities so that can alter her plan of care as necessary

## 2014-10-15 NOTE — Telephone Encounter (Signed)
Pt called in wanting to speak with Dr. Lurline Del nurse about a couple of things. 1st she wanted to know if Dr. Loletha Grayer wanted her to continue taking Furosemide and 2nd she states that Dr. Maudie Mercury wanted her to have her thyroid checked and she isnt sure if she should proceed with the procedure since a dye will be used. She would like to be advised by Dr. Loletha Grayer about this . Please call  Thanks

## 2014-10-20 ENCOUNTER — Ambulatory Visit (HOSPITAL_COMMUNITY): Payer: Medicare Other

## 2014-10-21 ENCOUNTER — Other Ambulatory Visit (HOSPITAL_COMMUNITY): Payer: Medicare Other

## 2014-10-22 ENCOUNTER — Ambulatory Visit: Payer: Medicare Other | Attending: Cardiovascular Disease

## 2014-10-22 DIAGNOSIS — R269 Unspecified abnormalities of gait and mobility: Secondary | ICD-10-CM | POA: Diagnosis not present

## 2014-10-22 DIAGNOSIS — R531 Weakness: Secondary | ICD-10-CM | POA: Diagnosis not present

## 2014-10-22 DIAGNOSIS — R5381 Other malaise: Secondary | ICD-10-CM | POA: Insufficient documentation

## 2014-10-22 DIAGNOSIS — R2681 Unsteadiness on feet: Secondary | ICD-10-CM | POA: Insufficient documentation

## 2014-10-22 NOTE — Therapy (Signed)
[facility] [address] [place], [place], [address] Phone: [phone]   Fax:  [phone]  Physical Therapy Evaluation  Patient Details  Name: Holly Bolton MRN: [id number] Date of Birth: [birth year] Referring Provider:  Sanda Klein, MD  Encounter Date: 10/22/2014      PT End of Session - 10/22/14 1523    Visit Number 1   Number of Visits 17   Date for PT Re-Evaluation 12/21/14   Authorization Type G-code every 10th visit.   PT Start Time 1104   PT Stop Time 1149   PT Time Calculation (min) 45 min   Equipment Utilized During Treatment Gait belt   Activity Tolerance Patient tolerated treatment well   Behavior During Therapy WFL for tasks assessed/performed      Past Medical History  Diagnosis Date  . Hypertension   . Asthma   . Aneurysm (arteriovenous) of coronary vessels     ascending aorta requiring bypass of the LAD  . Presence of permanent cardiac pacemaker 08/26/01    Sinus node dysfunction-St.Jude  . Pacemaker generator end of life 11/18/08    Intermittent high-grade atrioventricular block  . Hyperlipemia   . Carotid stenosis 04/28/08    Doppler: <40% stenosis bilateral  . Status post ascending aortic aneurysm repair/AVR -  Medtronic Freestyle root 09/28/2014    ascending aorta requiring bypass of the LAD     Past Surgical History  Procedure Laterality Date  . Cardiac surgery    . Pacemaker insertion  08/26/01    St.Jude  . Pacemaker generator change  11/18/08    St.Jude  . Ascending aortic aneurysm repair  09/22/97    porcine aortic root  . Coronary artery bypass graft  09/22/97    LIMA to the LAD  . Abdominal hysterectomy  1966  . Kidney stone surgery  1959    left  . Nephrectomy  1958    right  . Ovarian cyst surgery  1948  . Appendectomy  1943  . Breast fibroadenoma surgery  11/89    There were no vitals filed for this visit.  Visit Diagnosis:  Abnormality of gait - Plan: PT plan of  care cert/re-cert  Physical deconditioning - Plan: PT plan of care cert/re-cert  General weakness - Plan: PT plan of care cert/re-cert  Unsteadiness - Plan: PT plan of care cert/re-cert      Subjective Assessment - 10/22/14 1112    Subjective Pt's son, Holly Bolton, present during session. Pt reports she has been experiencing B LE weakness, that has become progressively worse over the last 4-5 years. Pt reported  MD performed US of B LEs and it was negative. Pt also reports she has L LE pain, and MD believes this could be due to spine curvature, which has been getting progressively worse over the last 10 years..  Pt has not fallen, but is becoming more worried about walking due to impaired balance.   Patient is accompained by: Family member  Holly Bolton-son   Pertinent History HTN, aortic valve replacemtn, aortic aneurysm, CAD, pacemaker   Patient Stated Goals Walk with confidence    Currently in Pain? Yes   Pain Score 2    Pain Location Leg   Pain Orientation Left  L lateral calf   Pain Descriptors / Indicators Cramping   Pain Type Chronic pain   Pain Radiating Towards ankle   Pain Onset More than a month ago   Pain Frequency Intermittent   Aggravating Factors  worse in  the morning (pt sleeps in supine with head propped up) and after "being active"   Pain Relieving Factors rest            [facility] PT Assessment - 10/22/14 1116    Assessment   Medical Diagnosis Physical deconditioning   Onset Date/Surgical Date 10/14/12   Prior Therapy none   Precautions   Precautions Fall   Precaution Comments based on BERG score   Restrictions   Weight Bearing Restrictions No   Balance Screen   Has the patient fallen in the past 6 months No  pt fell in her yard a few years ago   Has the patient had a decrease in activity level because of a fear of falling?  Yes   Is the patient reluctant to leave their home because of a fear of falling?  No   Home Environment   Living Environment Private residence    Living Arrangements Children  Holly Bolton-son   Available Help at Discharge Family   Type of [place] to enter   Entrance Stairs-Number of Steps 3   Entrance Stairs-Rails Can reach both   [place] Other (Comment);Two level  with a basement (freezer in the basement)   Alternate Level Stairs-Number of Steps 12   Alternate Level Stairs-Rails Can reach both   [place] - single point   Prior Function   Level of Independence Independent   Vocation Retired   Leisure walking, go to [employer] school   Cognition   Overall Cognitive Status --  pt reports poor short term Therapist, sports Appears Intact   Posture/Postural Control   Posture/Postural Control Postural limitations   Postural Limitations Rounded Shoulders;Forward head;Increased thoracic kyphosis   ROM / Strength   AROM / PROM / Strength AROM;Strength   AROM   Overall AROM  Within functional limits for tasks performed   Overall AROM Comments B UE/LE   Strength   Overall Strength Deficits   Overall Strength Comments B UE WFL. R hip flex: 4/5, R knee ext: 4/5, R knee flex: 3+/5, R DF: 4/5. L hip flex: 3+/5, L knee ext: 4/5 with pain, L knee flex: 3+/5, L DF: 4/5.   Transfers   Transfers Stand to Sit;Sit to Stand   Sit to Stand 5: Supervision;With upper extremity assist;From chair/3-in-1   Stand to Sit 5: Supervision;With upper extremity assist;To chair/3-in-1   Ambulation/Gait   Ambulation/Gait Yes   Ambulation/Gait Assistance 5: Supervision;4: Min guard   Ambulation/Gait Assistance Details Pt experienced increased posterior postural sway during 180 degree turns.   Ambulation Distance (Feet) 75 Feet   Assistive device None   Gait Pattern Step-through pattern;Decreased stride length;Decreased dorsiflexion - right;Decreased dorsiflexion - left  decreased hip rotation   Ambulation Surface Level;Indoor   Gait velocity 2.61ft/sec.  with AD   Standardized Balance Assessment    Standardized Balance Assessment Berg Balance Test;Timed Up and Go Test   Berg Balance Test   Sit to Stand Able to stand  independently using hands   Standing Unsupported Able to stand 2 minutes with supervision   Sitting with Back Unsupported but Feet Supported on Floor or Stool Able to sit safely and securely 2 minutes   Stand to Sit Sits safely with minimal use of hands   Transfers Able to transfer safely, definite need of hands   Standing Unsupported with Eyes Closed Able to stand 10 seconds with supervision   Standing Ubsupported with Feet  Together Able to place feet together independently and stand for 1 minute with supervision   From Standing, Reach Forward with Outstretched Arm Can reach forward >5 cm safely (2")  4"   From Standing Position, Pick up Object from [place] to pick up shoe, needs supervision   From Standing Position, Turn to Look Behind Over each Shoulder Looks behind one side only/other side shows less weight shift   Turn 360 Degrees Needs close supervision or verbal cueing   Standing Unsupported, Alternately Place Feet on Step/Stool Able to complete >2 steps/needs minimal assist  3 steps before LOB   Standing Unsupported, One Foot in [place] to plae foot ahead of the other independently and hold 30 seconds   Standing on One Leg Tries to lift leg/unable to hold 3 seconds but remains standing independently  2 seconds   Total Score 37   Timed Up and Go Test   TUG Normal TUG   Normal TUG (seconds) 14.63  no AD                           PT Education - 10/22/14 1523    Education provided Yes   Education Details PT duration/frequency and results of outcome measures. PT also explained that pt should ambulate with an AD at all times to improve safety, based on BERG score. PT asked pt to bring [facility] next visit and we'll trial a rollator.   Person(s) Educated Patient;Child(ren)   Methods Explanation   Comprehension Verbalized understanding           PT Short Term Goals - 10/22/14 1534    PT SHORT TERM GOAL #1   Title Pt will be ind. in HEP to improve balance, strength, and endurance. Target date: 11/19/14.   Status New   PT SHORT TERM GOAL #2   Title Pt will improve BERG score to >/=41/56 to decrease falls risk. Target date :11/19/14.   Status New   PT SHORT TERM GOAL #3   Title Pt will improve gait speed to >/=2.62ft/sec. with LRAD to amb. safely in the community. Target date: 11/19/14.   Status New   PT SHORT TERM GOAL #4   Title Pt will perform TUG with LRAD in </=13.5 seconds to decrease falls risk. Target date: 11/19/14.   Status New   PT SHORT TERM GOAL #5   Title Pt will amb. 300' over even terrain with LRAD at MOD I level to improve functional mobilty. Target date: 11/19/14.   Status New           PT Long Term Goals - 10/22/14 1536    PT LONG TERM GOAL #1   Title Pt will verbalize understanding of fall prevention strategies to decrease risk of falls. Target date: 12/17/14.   Status New   PT LONG TERM GOAL #2   Title Pt will improve BERG score to >/=45/56 to decrease falls risk. Target dae: 12/17/14.   Status New   PT LONG TERM GOAL #3   Title Pt will amb. 600' over even/uneven terrain with LRAD at MOD I level to improve functional mobility. Target date: 12/17/14.   Status New   PT LONG TERM GOAL #4   Title Pt will traverse 12 steps with B handrails at MOD I level to safely negotiate stairs at home. Target date: 12/17/14.   Status New   PT LONG TERM GOAL #5   Title Perform 6 minute walk test (MWT) and write  goal if approriate. Target date: 12/17/14.   Status New               Plan - 2014/10/28 1524    Clinical Impression Statement Pt is a pleasant 81y/o female presenting to OPPT neuro with impaired balance, decreased strength, gait deviations, and decreased endurance. Pt's BERG score and TUG time indicate she is at risk for falls. Pt's gait speed indicate she is not able to amb. in the community safely.  PT will  not directly address L LE pain, but will contiue to monitor.    Pt will benefit from skilled therapeutic intervention in order to improve on the following deficits Abnormal gait;Pain;Postural dysfunction;Decreased mobility;Decreased balance;Decreased knowledge of use of DME;Decreased strength   Rehab Potential Good   PT Frequency 2x / week   PT Duration 8 weeks   PT Treatment/Interventions ADLs/Self Care Home Management;Neuromuscular re-education;Biofeedback;DME Instruction;Gait training;Stair training;Balance training;Therapeutic exercise;Therapeutic activities;Functional mobility training;Patient/family education   PT Next Visit Plan 6 MWT, initiate OTAGO and walking program.   Consulted and Agree with Plan of Care Patient;Family member/caregiver   Family Member Consulted pt's on-Holly Bolton          G-Codes - 28-Oct-2014 1539    Functional Assessment Tool Used BERG: 43/56; TUG no AD: 14.63; gait speed: 2.80ft/sec. no AD   Functional Limitation Mobility: Walking and moving around   Mobility: Walking and Moving Around Current Status [phone]) At least 40 percent but less than 60 percent impaired, limited or restricted   Mobility: Walking and Moving Around Goal Status [phone]) At least 1 percent but less than 20 percent impaired, limited or restricted       Problem List Patient Active Problem List   Diagnosis Date Noted  . Status post ascending aortic aneurysm repair/AVR -  Medtronic Freestyle root 09/28/2014  . CAD s/p CABG (LIMA to LAD) 07/08/2013  . History of aortic root repairand bioprosthetic AVR 07/08/2013  . Hyperlipidemia 07/08/2013  . SSS (sick sinus syndrome) 07/08/2013  . Second degree AV block 07/08/2013  . Pacemaker, dual chamber St. Jude 2010 07/08/2013  . Essential hypertension 07/08/2013    Jakeim Sedore L 10-28-2014, 3:41 PM  [facility] [address] [place], [place], [address] Phone: [phone]   Fax:   [phone]    Geoffry Paradise, PT,DPT 10-28-14 3:41 PM Phone: [phone] Fax: [phone]

## 2014-10-22 NOTE — Telephone Encounter (Signed)
Can this encounter be closed?

## 2014-10-26 ENCOUNTER — Encounter: Payer: Self-pay | Admitting: Cardiovascular Disease

## 2014-10-26 ENCOUNTER — Telehealth: Payer: Self-pay | Admitting: Cardiovascular Disease

## 2014-10-26 NOTE — Telephone Encounter (Signed)
Does the foot look pale or dusky purple? Agree most likely a pinched nerve, but she does have a moderate iliac artery stenosis on that side. If leg is cool or discolored, would bring in for a Doppler

## 2014-10-26 NOTE — Telephone Encounter (Signed)
Pt does notice some increase in discoloration that seems to be unilateral on left leg and recent. Pt apprised of Dr. Victorino December considerations regarding this - amenable to proceeding w/ repeat yearly doppler ahead of routine schedule. Will send request for scheduler to have this test set up.

## 2014-10-26 NOTE — Telephone Encounter (Signed)
Called patient. She states she was started in physical therapy on Thursday, had one session. Day after, she reported pain in left leg, "terrific hurting", also radiating into back. She used heating pad for relief. This improved somewhat. She is still having residual soreness. Today pain "not severe, feels tight". Gave reassurance - may be muscle pull or pinched nerve. Inquired if she could f/u w/ PCP. She did not answer this.  Noted also she had bilateral dopplers last year and year prior around [place]. Patient wanted to see if any concern r/t arterial disease and if return study could be moved up.  Routing to Dr. Sallyanne Kuster to advise.

## 2014-10-26 NOTE — Telephone Encounter (Signed)
Pt called in stating that over the weekend she started having some cramping and numbness in her leg and she wanted to know if she needs to come in and be seen or continue to go to physical therapy. Please call  Thanks

## 2014-10-27 ENCOUNTER — Ambulatory Visit: Payer: Medicare Other

## 2014-10-28 NOTE — Telephone Encounter (Signed)
Can this encounter be closed?

## 2014-10-29 ENCOUNTER — Ambulatory Visit: Payer: Medicare Other | Admitting: Physical Therapy

## 2014-10-29 DIAGNOSIS — R2681 Unsteadiness on feet: Secondary | ICD-10-CM | POA: Diagnosis not present

## 2014-10-29 DIAGNOSIS — R531 Weakness: Secondary | ICD-10-CM | POA: Diagnosis not present

## 2014-10-29 DIAGNOSIS — R5381 Other malaise: Secondary | ICD-10-CM | POA: Diagnosis not present

## 2014-10-29 DIAGNOSIS — R269 Unspecified abnormalities of gait and mobility: Secondary | ICD-10-CM | POA: Diagnosis not present

## 2014-10-29 NOTE — Therapy (Signed)
[facility] [address] [place], [place], [address] Phone: [phone]   Fax:  [phone]  Physical Therapy Treatment  Patient Details  Name: Holly Bolton MRN: [id number] Date of Birth: October 31, 1922 Referring Provider:  Jani Gravel, MD  Encounter Date: 10/29/2014      PT End of Session - 10/29/14 1052    Visit Number 2   Number of Visits 17   Date for PT Re-Evaluation 12/21/14   Authorization Type G-code every 10th visit.   PT Start Time 1021   PT Stop Time 1105   PT Time Calculation (min) 44 min   Activity Tolerance Patient tolerated treatment well   Behavior During Therapy WFL for tasks assessed/performed      Past Medical History  Diagnosis Date  . Hypertension   . Asthma   . Aneurysm (arteriovenous) of coronary vessels     ascending aorta requiring bypass of the LAD  . Presence of permanent cardiac pacemaker 08/26/01    Sinus node dysfunction-St.Jude  . Pacemaker generator end of life 11/18/08    Intermittent high-grade atrioventricular block  . Hyperlipemia   . Carotid stenosis 04/28/08    Doppler: <40% stenosis bilateral  . Status post ascending aortic aneurysm repair/AVR -  Medtronic Freestyle root 09/28/2014    ascending aorta requiring bypass of the LAD     Past Surgical History  Procedure Laterality Date  . Cardiac surgery    . Pacemaker insertion  08/26/01    St.Jude  . Pacemaker generator change  11/18/08    St.Jude  . Ascending aortic aneurysm repair  09/22/97    porcine aortic root  . Coronary artery bypass graft  09/22/97    LIMA to the LAD  . Abdominal hysterectomy  1966  . Kidney stone surgery  1959    left  . Nephrectomy  1958    right  . Ovarian cyst surgery  1948  . Appendectomy  1943  . Breast fibroadenoma surgery  11/89    There were no vitals filed for this visit.  Visit Diagnosis:  Physical deconditioning      Subjective Assessment - 10/29/14 1023    Subjective After PT  eval, pt noted LLE pain and in back.  She reports she could hardly move after that.  She contacted doctor who has ordered an ultrasound next week.   Currently in Pain? Yes   Pain Score 5    Pain Location Leg   Pain Orientation Left   Pain Descriptors / Indicators Aching;Tightness   Pain Type Chronic pain;Acute pain  feels it's gotten worse   Pain Onset More than a month ago   Pain Frequency Intermittent   Aggravating Factors  worse since PT eval-unsure what specifically might have aggravated   Pain Relieving Factors heating pad            Observations per Clinical parameter for DVT:  Pt scores 1 for localized tenderness along distribution of deep venous system, 1 for collateral superficial veins, -2 for alternative diagnosis (question muscle overuse or strain following PT eval).  Score = 0.             [facility] Adult PT Treatment/Exercise - 10/29/14 1057    Transfers   Transfers Sit to Stand;Stand to Sit   Sit to Stand 5: Supervision   Stand to Sit 5: Supervision   Ambulation/Gait   Ambulation/Gait Yes   Ambulation/Gait Assistance 5: Supervision   Ambulation Distance (Feet) 230 Feet  in 2  minutes, 40 seconds   Assistive device None         Self Care, continued:       PT Education - 10/29/14 1050    Education provided Yes   Education Details Fall prevention, DVT education/prevention information provided   Person(s) Educated Patient   Methods Explanation;Handout   Comprehension Verbalized understanding          PT Short Term Goals - 10/22/14 1534    PT SHORT TERM GOAL #1   Title Pt will be ind. in HEP to improve balance, strength, and endurance. Target date: 11/19/14.   Status New   PT SHORT TERM GOAL #2   Title Pt will improve BERG score to >/=41/56 to decrease falls risk. Target date :11/19/14.   Status New   PT SHORT TERM GOAL #3   Title Pt will improve gait speed to >/=2.24ft/sec. with LRAD to amb. safely in the community. Target date: 11/19/14.    Status New   PT SHORT TERM GOAL #4   Title Pt will perform TUG with LRAD in </=13.5 seconds to decrease falls risk. Target date: 11/19/14.   Status New   PT SHORT TERM GOAL #5   Title Pt will amb. 300' over even terrain with LRAD at MOD I level to improve functional mobilty. Target date: 11/19/14.   Status New           PT Long Term Goals - 10/22/14 1536    PT LONG TERM GOAL #1   Title Pt will verbalize understanding of fall prevention strategies to decrease risk of falls. Target date: 12/17/14.   Status New   PT LONG TERM GOAL #2   Title Pt will improve BERG score to >/=45/56 to decrease falls risk. Target dae: 12/17/14.   Status New   PT LONG TERM GOAL #3   Title Pt will amb. 600' over even/uneven terrain with LRAD at MOD I level to improve functional mobility. Target date: 12/17/14.   Status New   PT LONG TERM GOAL #4   Title Pt will traverse 12 steps with B handrails at MOD I level to safely negotiate stairs at home. Target date: 12/17/14.   Status New   PT LONG TERM GOAL #5   Title Perform 6 minute walk test (MWT) and write goal if approriate. Target date: 12/17/14.   Status New               Plan - 10/29/14 1243    Clinical Impression Statement Pt concerned because of LLE pain since evaluation last week.  Per Clinical parameter score, pt appears not to be at significant risk for DVT; however pt is scheduled per physician for doppler next week.  Discussed possiblity of potential muscle strain in LLE; also noted pt's posterior pelvic tilt/posture in sitting position.  Provided education for majority of visit; attempted 6 minute walk test, but pt only able to ambulate 2 minutes 40 seconds prior to needing to sit due to fatigue.  Will follow POC as patient is able.   Pt will benefit from skilled therapeutic intervention in order to improve on the following deficits Abnormal gait;Pain;Postural dysfunction;Decreased mobility;Decreased balance;Decreased knowledge of use of  DME;Decreased strength   Rehab Potential Good   PT Frequency 2x / week   PT Duration 8 weeks   PT Treatment/Interventions ADLs/Self Care Home Management;Neuromuscular re-education;Biofeedback;DME Instruction;Gait training;Stair training;Balance training;Therapeutic exercise;Therapeutic activities;Functional mobility training;Patient/family education   PT Next Visit Plan  initiate OTAGO and walking program (pt to have doppler/ultrasound  next week for LLE)   Consulted and Agree with Plan of Care Patient;Family member/caregiver   Family Member Consulted pt's on-Ed        Problem List Patient Active Problem List   Diagnosis Date Noted  . Status post ascending aortic aneurysm repair/AVR -  Medtronic Freestyle root 09/28/2014  . CAD s/p CABG (LIMA to LAD) 07/08/2013  . History of aortic root repairand bioprosthetic AVR 07/08/2013  . Hyperlipidemia 07/08/2013  . SSS (sick sinus syndrome) 07/08/2013  . Second degree AV block 07/08/2013  . Pacemaker, dual chamber St. Jude 2010 07/08/2013  . Essential hypertension 07/08/2013    Finnlee Guarnieri W. 10/29/2014, 12:49 PM  [facility]raz[facility] Butt., PT [facility] [address] [place], [place], [address] Phone: [phone]   Fax:  [phone]

## 2014-10-29 NOTE — Patient Instructions (Signed)

## 2014-11-03 ENCOUNTER — Ambulatory Visit: Payer: Medicare Other

## 2014-11-03 DIAGNOSIS — R531 Weakness: Secondary | ICD-10-CM

## 2014-11-03 DIAGNOSIS — R5381 Other malaise: Secondary | ICD-10-CM | POA: Diagnosis not present

## 2014-11-03 DIAGNOSIS — R2681 Unsteadiness on feet: Secondary | ICD-10-CM

## 2014-11-03 DIAGNOSIS — R269 Unspecified abnormalities of gait and mobility: Secondary | ICD-10-CM

## 2014-11-03 NOTE — Therapy (Signed)
[facility] [address] [place], [place], [address] Phone: [phone]   Fax:  [phone]  Physical Therapy Treatment  Patient Details  Name: Holly Bolton MRN: [id number] Date of Birth: 03/30/1922 Referring Provider:  Jani Gravel, MD  Encounter Date: 11/03/2014      PT End of Session - 11/03/14 1243    Visit Number 3   Number of Visits 17   Date for PT Re-Evaluation 12/21/14   Authorization Type G-code every 10th visit.   PT Start Time 1146   PT Stop Time 1228   PT Time Calculation (min) 42 min   Equipment Utilized During Treatment Gait belt   Activity Tolerance Patient tolerated treatment well   Behavior During Therapy WFL for tasks assessed/performed      Past Medical History  Diagnosis Date  . Hypertension   . Asthma   . Aneurysm (arteriovenous) of coronary vessels     ascending aorta requiring bypass of the LAD  . Presence of permanent cardiac pacemaker 08/26/01    Sinus node dysfunction-St.Jude  . Pacemaker generator end of life 11/18/08    Intermittent high-grade atrioventricular block  . Hyperlipemia   . Carotid stenosis 04/28/08    Doppler: <40% stenosis bilateral  . Status post ascending aortic aneurysm repair/AVR -  Medtronic Freestyle root 09/28/2014    ascending aorta requiring bypass of the LAD     Past Surgical History  Procedure Laterality Date  . Cardiac surgery    . Pacemaker insertion  08/26/01    St.Jude  . Pacemaker generator change  11/18/08    St.Jude  . Ascending aortic aneurysm repair  09/22/97    porcine aortic root  . Coronary artery bypass graft  09/22/97    LIMA to the LAD  . Abdominal hysterectomy  1966  . Kidney stone surgery  1959    left  . Nephrectomy  1958    right  . Ovarian cyst surgery  1948  . Appendectomy  1943  . Breast fibroadenoma surgery  11/89    There were no vitals filed for this visit.  Visit Diagnosis:  Abnormality of  gait  Unsteadiness  General weakness      Subjective Assessment - 11/03/14 1149    Subjective Pt reported she feels better today, her LLE pain is more of a discomfort today and not a sharp pain. She has an [place] scheduled for tomorrow.    Pertinent History HTN, aortic valve replacemtn, aortic aneurysm, CAD, pacemaker   Patient Stated Goals Walk with confidence    Currently in Pain? Yes   Pain Score --  2-3/10   Pain Location Leg   Pain Orientation Left   Pain Descriptors / Indicators Discomfort;Tightness;Squeezing   Pain Type Chronic pain   Pain Onset More than a month ago   Pain Frequency Intermittent   Aggravating Factors  movement   Pain Relieving Factors heating pad                              Balance Exercises - 11/03/14 1153    OTAGO PROGRAM   Head Movements Standing;5 reps   Neck Movements Sitting;Other reps (comment);5 reps  lying supine   Back Extension Standing;5 reps   Trunk Movements Standing;5 reps   Ankle Movements Standing;10 reps   Knee Extensor Weight (comment);20 reps  x10 with yellowband and x10 without band   Knee Flexor 10 reps  unable to perform  with LLE due to hamstring pain   Hip ABductor 5 reps   Ankle Plantorflexors 20 reps, support   Ankle Dorsiflexors 20 reps, support   Overall OTAGO Comments Pt unable to perform cervical retraction in seated position, despite extensive cues and demonstration, so PT had perform in supine with pillows. Pt reported L lateral hamstring pain during standing L knee flexion, indicating L LE pain is likely from a lateral hamstring strain. Pt required frequent standing/seated rest breaks 2/2 fatigue.           PT Education - 11/03/14 1242    Education provided Yes   Education Details OTAGO HEP initiated.   Person(s) Educated Patient   Methods Explanation;Demonstration;Tactile cues;Verbal cues;Handout   Comprehension Verbalized understanding;Returned demonstration;Need further instruction           PT Short Term Goals - 11/03/14 1245    PT SHORT TERM GOAL #1   Title Pt will be ind. in HEP to improve balance, strength, and endurance. Target date: 11/19/14.   Status On-going   PT SHORT TERM GOAL #2   Title Pt will improve BERG score to >/=41/56 to decrease falls risk. Target date :11/19/14.   Status On-going   PT SHORT TERM GOAL #3   Title Pt will improve gait speed to >/=2.62ft/sec. with LRAD to amb. safely in the community. Target date: 11/19/14.   Status On-going   PT SHORT TERM GOAL #4   Title Pt will perform TUG with LRAD in </=13.5 seconds to decrease falls risk. Target date: 11/19/14.   Status On-going   PT SHORT TERM GOAL #5   Title Pt will amb. 300' over even terrain with LRAD at MOD I level to improve functional mobilty. Target date: 11/19/14.   Status On-going           PT Long Term Goals - 11/03/14 1245    PT LONG TERM GOAL #1   Title Pt will verbalize understanding of fall prevention strategies to decrease risk of falls. Target date: 12/17/14.   Status On-going   PT LONG TERM GOAL #2   Title Pt will improve BERG score to >/=45/56 to decrease falls risk. Target dae: 12/17/14.   Status On-going   PT LONG TERM GOAL #3   Title Pt will amb. 600' over even/uneven terrain with LRAD at MOD I level to improve functional mobility. Target date: 12/17/14.   Status On-going   PT LONG TERM GOAL #4   Title Pt will traverse 12 steps with B handrails at MOD I level to safely negotiate stairs at home. Target date: 12/17/14.   Status On-going   PT LONG TERM GOAL #5   Title Perform 6 minute walk test (MWT) and write goal if approriate. Target date: 12/17/14.   Status On-going               Plan - 11/03/14 1243    Clinical Impression Statement Pt required frequent standing and/or seated rest breaks 2/2 fatigue. Pt's LLE pain appears to be from a L lat. hamstring strain, as she reported active knee flexion increased pain and rest eased pain. PT will await doppler results  and provide gentle hamstring stretches to decr. pain as indicated. Continue with POC.   Pt will benefit from skilled therapeutic intervention in order to improve on the following deficits Abnormal gait;Pain;Postural dysfunction;Decreased mobility;Decreased balance;Decreased knowledge of use of DME;Decreased strength   Rehab Potential Good   PT Frequency 2x / week   PT Duration 8 weeks   PT  Treatment/Interventions ADLs/Self Care Home Management;Neuromuscular re-education;Biofeedback;DME Instruction;Gait training;Stair training;Balance training;Therapeutic exercise;Therapeutic activities;Functional mobility training;Patient/family education   PT Next Visit Plan Finish OTAGO and add walking program (pt to have doppler/ultrasound on 9/21 for LLE pain). Provide postural education   PT Home Exercise Plan OTAGO   Consulted and Agree with Plan of Care Patient        Problem List Patient Active Problem List   Diagnosis Date Noted  . Status post ascending aortic aneurysm repair/AVR -  Medtronic Freestyle root 09/28/2014  . CAD s/p CABG (LIMA to LAD) 07/08/2013  . History of aortic root repairand bioprosthetic AVR 07/08/2013  . Hyperlipidemia 07/08/2013  . SSS (sick sinus syndrome) 07/08/2013  . Second degree AV block 07/08/2013  . Pacemaker, dual chamber St. Jude 2010 07/08/2013  . Essential hypertension 07/08/2013    Josphine Laffey L 11/03/2014, 12:46 PM  [facility] [address] [place], [place], [address] Phone: [phone]   Fax:  [phone]     Geoffry Paradise, PT,DPT 11/03/2014 12:46 PM Phone: [phone] Fax: [phone]

## 2014-11-04 ENCOUNTER — Other Ambulatory Visit: Payer: Self-pay | Admitting: Cardiovascular Disease

## 2014-11-04 ENCOUNTER — Ambulatory Visit (HOSPITAL_COMMUNITY)
Admission: RE | Admit: 2014-11-04 | Discharge: 2014-11-04 | Disposition: A | Discharge status: Home / Self Care | Payer: Medicare Other | Source: Ambulatory Visit | Attending: Cardiovascular Disease | Admitting: Cardiovascular Disease

## 2014-11-04 DIAGNOSIS — I2510 Atherosclerotic heart disease of native coronary artery without angina pectoris: Secondary | ICD-10-CM | POA: Diagnosis not present

## 2014-11-04 DIAGNOSIS — I739 Peripheral vascular disease, unspecified: Secondary | ICD-10-CM

## 2014-11-04 DIAGNOSIS — E785 Hyperlipidemia, unspecified: Secondary | ICD-10-CM | POA: Diagnosis not present

## 2014-11-04 DIAGNOSIS — I10 Essential (primary) hypertension: Secondary | ICD-10-CM | POA: Diagnosis not present

## 2014-11-05 ENCOUNTER — Ambulatory Visit: Payer: Medicare Other | Admitting: Physical Therapy

## 2014-11-05 DIAGNOSIS — R269 Unspecified abnormalities of gait and mobility: Secondary | ICD-10-CM | POA: Diagnosis not present

## 2014-11-05 DIAGNOSIS — R2681 Unsteadiness on feet: Secondary | ICD-10-CM | POA: Diagnosis not present

## 2014-11-05 DIAGNOSIS — R5381 Other malaise: Secondary | ICD-10-CM | POA: Diagnosis not present

## 2014-11-05 DIAGNOSIS — R531 Weakness: Secondary | ICD-10-CM

## 2014-11-05 NOTE — Therapy (Signed)
[facility] [address] [place], [place], [address] Phone: [phone]   Fax:  [phone]  Physical Therapy Treatment  Patient Details  Name: Holly Bolton MRN: [id number] Date of Birth: Mar 23, 1922 Referring Provider:  Jani Gravel, MD  Encounter Date: 11/05/2014      PT End of Session - 11/05/14 1231    Visit Number 4   Number of Visits 17   Date for PT Re-Evaluation 12/21/14   Authorization Type G-code every 10th visit.   PT Start Time 1019   PT Stop Time 1103   PT Time Calculation (min) 44 min   Equipment Utilized During Treatment Gait belt   Activity Tolerance Patient tolerated treatment well  needed frequent rest breaks   Behavior During Therapy WFL for tasks assessed/performed      Past Medical History  Diagnosis Date  . Hypertension   . Asthma   . Aneurysm (arteriovenous) of coronary vessels     ascending aorta requiring bypass of the LAD  . Presence of permanent cardiac pacemaker 08/26/01    Sinus node dysfunction-St.Jude  . Pacemaker generator end of life 11/18/08    Intermittent high-grade atrioventricular block  . Hyperlipemia   . Carotid stenosis 04/28/08    Doppler: <40% stenosis bilateral  . Status post ascending aortic aneurysm repair/AVR -  Medtronic Freestyle root 09/28/2014    ascending aorta requiring bypass of the LAD     Past Surgical History  Procedure Laterality Date  . Cardiac surgery    . Pacemaker insertion  08/26/01    St.Jude  . Pacemaker generator change  11/18/08    St.Jude  . Ascending aortic aneurysm repair  09/22/97    porcine aortic root  . Coronary artery bypass graft  09/22/97    LIMA to the LAD  . Abdominal hysterectomy  1966  . Kidney stone surgery  1959    left  . Nephrectomy  1958    right  . Ovarian cyst surgery  1948  . Appendectomy  1943  . Breast fibroadenoma surgery  11/89    There were no vitals filed for this visit.  Visit Diagnosis:  General  weakness  Unsteadiness      Subjective Assessment - 11/05/14 1025    Subjective Had [place] yesterday but technician told her it would be a week before she got results.  Denies falls or changes.   Pertinent History HTN, aortic valve replacemtn, aortic aneurysm, CAD, pacemaker   Patient Stated Goals Walk with confidence    Currently in Pain? Other (Comment)  weakness in Bil LE's but denies pain   Pain Score --  weakness today but denies pain                              Balance Exercises - 11/05/14 1035    OTAGO PROGRAM   Head Movements Sitting;5 reps   Neck Movements Other reps (comment);5 reps  supine   Back Extension Standing;5 reps   Trunk Movements Standing;5 reps   Ankle Movements Sitting;10 reps   Knee Extensor 10 reps;Weight (comment)  with yellow theraband   Knee Flexor 10 reps  R LE only   Hip ABductor 10 reps   Ankle Plantorflexors 20 reps, support   Ankle Dorsiflexors 20 reps, support   Knee Bends 10 reps, support  5 reps x 2 instructed for home use secondary to fatigue   Backwards Walking Support   Overall OTAGO  Comments Pt continues with difficulty with neck retraction even with supine position.            PT Education - 11/05/14 1229    Education provided Yes   Education Details OTAGO, different types of canes (straight vs hurry cane)   Person(s) Educated Patient   Methods Explanation;Demonstration;Tactile cues;Verbal cues;Handout   Comprehension Verbalized understanding;Need further instruction;Returned demonstration          PT Short Term Goals - 11/03/14 1245    PT SHORT TERM GOAL #1   Title Pt will be ind. in HEP to improve balance, strength, and endurance. Target date: 11/19/14.   Status On-going   PT SHORT TERM GOAL #2   Title Pt will improve BERG score to >/=41/56 to decrease falls risk. Target date :11/19/14.   Status On-going   PT SHORT TERM GOAL #3   Title Pt will improve gait speed to >/=2.48ft/sec. with LRAD to amb.  safely in the community. Target date: 11/19/14.   Status On-going   PT SHORT TERM GOAL #4   Title Pt will perform TUG with LRAD in </=13.5 seconds to decrease falls risk. Target date: 11/19/14.   Status On-going   PT SHORT TERM GOAL #5   Title Pt will amb. 300' over even terrain with LRAD at MOD I level to improve functional mobilty. Target date: 11/19/14.   Status On-going           PT Long Term Goals - 11/03/14 1245    PT LONG TERM GOAL #1   Title Pt will verbalize understanding of fall prevention strategies to decrease risk of falls. Target date: 12/17/14.   Status On-going   PT LONG TERM GOAL #2   Title Pt will improve BERG score to >/=45/56 to decrease falls risk. Target dae: 12/17/14.   Status On-going   PT LONG TERM GOAL #3   Title Pt will amb. 600' over even/uneven terrain with LRAD at MOD I level to improve functional mobility. Target date: 12/17/14.   Status On-going   PT LONG TERM GOAL #4   Title Pt will traverse 12 steps with B handrails at MOD I level to safely negotiate stairs at home. Target date: 12/17/14.   Status On-going   PT LONG TERM GOAL #5   Title Perform 6 minute walk test (MWT) and write goal if approriate. Target date: 12/17/14.   Status On-going               Plan - 11/05/14 1232    Clinical Impression Statement Pt continues to require frequent seated rest breaks due to fatigue.  No results yet from LE [place].  Continue PT per POC.   Pt will benefit from skilled therapeutic intervention in order to improve on the following deficits Abnormal gait;Pain;Postural dysfunction;Decreased mobility;Decreased balance;Decreased knowledge of use of DME;Decreased strength   Rehab Potential Good   PT Frequency 2x / week   PT Duration 8 weeks   PT Treatment/Interventions ADLs/Self Care Home Management;Neuromuscular re-education;Biofeedback;DME Instruction;Gait training;Stair training;Balance training;Therapeutic exercise;Therapeutic activities;Functional mobility  training;Patient/family education   PT Next Visit Plan Finish OTAGO and add walking program (pt to have doppler/ultrasound on 9/21 for LLE pain). Provide postural education   PT Home Exercise Plan OTAGO   Consulted and Agree with Plan of Care Patient        Problem List Patient Active Problem List   Diagnosis Date Noted  . Status post ascending aortic aneurysm repair/AVR -  Medtronic Freestyle root 09/28/2014  . CAD s/p CABG (  LIMA to LAD) 07/08/2013  . History of aortic root repairand bioprosthetic AVR 07/08/2013  . Hyperlipidemia 07/08/2013  . SSS (sick sinus syndrome) 07/08/2013  . Second degree AV block 07/08/2013  . Pacemaker, dual chamber St. Jude 2010 07/08/2013  . Essential hypertension 07/08/2013    Narda Bonds 11/05/2014, 12:39 PM  [facility] [address] [place], [place], [address] Phone: [phone]   Fax:  [place] 11/05/2014 12:39 PM Phone: [phone] Fax: [phone]

## 2014-11-10 ENCOUNTER — Ambulatory Visit: Payer: Medicare Other | Admitting: Physical Therapy

## 2014-11-10 DIAGNOSIS — R5381 Other malaise: Secondary | ICD-10-CM | POA: Diagnosis not present

## 2014-11-10 DIAGNOSIS — R2681 Unsteadiness on feet: Secondary | ICD-10-CM | POA: Diagnosis not present

## 2014-11-10 DIAGNOSIS — R531 Weakness: Secondary | ICD-10-CM | POA: Diagnosis not present

## 2014-11-10 DIAGNOSIS — R269 Unspecified abnormalities of gait and mobility: Secondary | ICD-10-CM | POA: Diagnosis not present

## 2014-11-11 NOTE — Therapy (Signed)
[facility] [address] [place], [place], [address] Phone: [phone]   Fax:  [phone]  Physical Therapy Treatment  Patient Details  Name: Holly Bolton MRN: [id number] Date of Birth: 1922-07-31 Referring Provider:  Jani Gravel, MD  Encounter Date: 11/10/2014      PT End of Session - 11/11/14 1240    Visit Number 5   Number of Visits 17   Date for PT Re-Evaluation 12/21/14   Authorization Type G-code every 10th visit.   PT Start Time 1148   PT Stop Time 1231   PT Time Calculation (min) 43 min   Equipment Utilized During Treatment Gait belt   Activity Tolerance Patient tolerated treatment well  FREQUENT REST BREAKS   Behavior During Therapy WFL for tasks assessed/performed      Past Medical History  Diagnosis Date  . Hypertension   . Asthma   . Aneurysm (arteriovenous) of coronary vessels     ascending aorta requiring bypass of the LAD  . Presence of permanent cardiac pacemaker 08/26/01    Sinus node dysfunction-St.Jude  . Pacemaker generator end of life 11/18/08    Intermittent high-grade atrioventricular block  . Hyperlipemia   . Carotid stenosis 04/28/08    Doppler: <40% stenosis bilateral  . Status post ascending aortic aneurysm repair/AVR -  Medtronic Freestyle root 09/28/2014    ascending aorta requiring bypass of the LAD     Past Surgical History  Procedure Laterality Date  . Cardiac surgery    . Pacemaker insertion  08/26/01    St.Jude  . Pacemaker generator change  11/18/08    St.Jude  . Ascending aortic aneurysm repair  09/22/97    porcine aortic root  . Coronary artery bypass graft  09/22/97    LIMA to the LAD  . Abdominal hysterectomy  1966  . Kidney stone surgery  1959    left  . Nephrectomy  1958    right  . Ovarian cyst surgery  1948  . Appendectomy  1943  . Breast fibroadenoma surgery  11/89    There were no vitals filed for this visit.  Visit Diagnosis:   Unsteadiness  General weakness      Subjective Assessment - 11/11/14 0844    Subjective Denies falls or changes;states MD stated she would need to see cardiologist about decreased blood flow in L LE (per [place])   Pertinent History HTN, aortic valve replacemtn, aortic aneurysm, CAD, pacemaker   Patient Stated Goals Walk with confidence    Currently in Pain? No/denies                         [facility] Adult PT Treatment/Exercise - 11/11/14 0001    Transfers   Transfers Sit to Stand;Stand to Sit   Sit to Stand 5: Supervision   Stand to Sit 5: Supervision   Ambulation/Gait   Ambulation/Gait Yes   Ambulation/Gait Assistance 5: Supervision   Ambulation Distance (Feet) 120 Feet  three times   Assistive device Straight cane   Gait Pattern Step-through pattern;Decreased stride length;Decreased dorsiflexion - right;Decreased dorsiflexion - left   Ambulation Surface Level;Indoor   Posture/Postural Control   Posture/Postural Control Postural limitations   Postural Limitations Rounded Shoulders;Forward head;Increased thoracic kyphosis   Posture Comments Performed seated at wall for forward head x 1 minute hold x 2 and standing at door frame x 1 minute x 2;cues for head/neck retraction  Balance Exercises - 11/11/14 1248    OTAGO PROGRAM   Ankle Plantorflexors 20 reps, support   Ankle Dorsiflexors 20 reps, support   Tandem Stance 10 seconds, support   One Leg Stand 10 seconds, support           PT Education - 11/11/14 0845    Education provided Yes   Education Details need for exercises outside of therapy along with household activities          PT Short Term Goals - 11/03/14 1245    PT SHORT TERM GOAL #1   Title Pt will be ind. in HEP to improve balance, strength, and endurance. Target date: 11/19/14.   Status On-going   PT SHORT TERM GOAL #2   Title Pt will improve BERG score to >/=41/56 to decrease falls risk. Target date :11/19/14.   Status  On-going   PT SHORT TERM GOAL #3   Title Pt will improve gait speed to >/=2.22ft/sec. with LRAD to amb. safely in the community. Target date: 11/19/14.   Status On-going   PT SHORT TERM GOAL #4   Title Pt will perform TUG with LRAD in </=13.5 seconds to decrease falls risk. Target date: 11/19/14.   Status On-going   PT SHORT TERM GOAL #5   Title Pt will amb. 300' over even terrain with LRAD at MOD I level to improve functional mobilty. Target date: 11/19/14.   Status On-going           PT Long Term Goals - 11/03/14 1245    PT LONG TERM GOAL #1   Title Pt will verbalize understanding of fall prevention strategies to decrease risk of falls. Target date: 12/17/14.   Status On-going   PT LONG TERM GOAL #2   Title Pt will improve BERG score to >/=45/56 to decrease falls risk. Target dae: 12/17/14.   Status On-going   PT LONG TERM GOAL #3   Title Pt will amb. 600' over even/uneven terrain with LRAD at MOD I level to improve functional mobility. Target date: 12/17/14.   Status On-going   PT LONG TERM GOAL #4   Title Pt will traverse 12 steps with B handrails at MOD I level to safely negotiate stairs at home. Target date: 12/17/14.   Status On-going   PT LONG TERM GOAL #5   Title Perform 6 minute walk test (MWT) and write goal if approriate. Target date: 12/17/14.   Status On-going               Plan - 11/11/14 1242    Clinical Impression Statement Pt continues with decreased endurance and need for frequent rest breaks during therapy yet reports lots of household activity(working in yard, cleaning house, etc...).  Needs encouragement to  perform HEP.  Continue PT per POC.   Pt will benefit from skilled therapeutic intervention in order to improve on the following deficits Abnormal gait;Pain;Postural dysfunction;Decreased mobility;Decreased balance;Decreased knowledge of use of DME;Decreased strength   Rehab Potential Good   PT Frequency 2x / week   PT Duration 8 weeks   PT  Treatment/Interventions ADLs/Self Care Home Management;Neuromuscular re-education;Biofeedback;DME Instruction;Gait training;Stair training;Balance training;Therapeutic exercise;Therapeutic activities;Functional mobility training;Patient/family education   PT Next Visit Plan Finish OTAGO and add walking program. Provide postural education   PT Home Exercise Plan OTAGO   Consulted and Agree with Plan of Care Patient        Problem List Patient Active Problem List   Diagnosis Date Noted  . Status post ascending aortic aneurysm  repair/AVR -  Medtronic Freestyle root 09/28/2014  . CAD s/p CABG (LIMA to LAD) 07/08/2013  . History of aortic root repairand bioprosthetic AVR 07/08/2013  . Hyperlipidemia 07/08/2013  . SSS (sick sinus syndrome) 07/08/2013  . Second degree AV block 07/08/2013  . Pacemaker, dual chamber St. Jude 2010 07/08/2013  . Essential hypertension 07/08/2013    Narda Bonds 11/11/2014, 12:50 PM  [facility] [address] [place], [place], [address] Phone: [phone]   Fax:  [place], [place] 11/11/2014 12:50 PM Phone: [phone] Fax: [phone]

## 2014-11-12 ENCOUNTER — Ambulatory Visit: Payer: Medicare Other

## 2014-11-12 VITALS — BP 152/61 | HR 76

## 2014-11-12 DIAGNOSIS — R531 Weakness: Secondary | ICD-10-CM | POA: Diagnosis not present

## 2014-11-12 DIAGNOSIS — R5381 Other malaise: Secondary | ICD-10-CM | POA: Diagnosis not present

## 2014-11-12 DIAGNOSIS — R269 Unspecified abnormalities of gait and mobility: Secondary | ICD-10-CM | POA: Diagnosis not present

## 2014-11-12 DIAGNOSIS — R2681 Unsteadiness on feet: Secondary | ICD-10-CM

## 2014-11-12 NOTE — Therapy (Signed)
[facility] [address] [place], [place], [address] Phone: [phone]   Fax:  [phone]  Physical Therapy Treatment  Patient Details  Name: Holly Bolton MRN: [id number] Date of Birth: [birth year] Referring Provider:  Jani Gravel, MD  Encounter Date: 11/12/2014      PT End of Session - 11/12/14 1120    Visit Number 6   Number of Visits 17   Date for PT Re-Evaluation 12/21/14   Authorization Type G-code every 10th visit.   PT Start Time [id number]   PT Stop Time 1014   PT Time Calculation (min) 40 min   Equipment Utilized During Treatment Gait belt   Activity Tolerance Patient tolerated treatment well   Behavior During Therapy WFL for tasks assessed/performed      Past Medical History  Diagnosis Date  . Hypertension   . Asthma   . Aneurysm (arteriovenous) of coronary vessels     ascending aorta requiring bypass of the LAD  . Presence of permanent cardiac pacemaker 08/26/01    Sinus node dysfunction-St.Jude  . Pacemaker generator end of life 11/18/08    Intermittent high-grade atrioventricular block  . Hyperlipemia   . Carotid stenosis 04/28/08    Doppler: <40% stenosis bilateral  . Status post ascending aortic aneurysm repair/AVR -  Medtronic Freestyle root 09/28/2014    ascending aorta requiring bypass of the LAD     Past Surgical History  Procedure Laterality Date  . Cardiac surgery    . Pacemaker insertion  08/26/01    St.Jude  . Pacemaker generator change  11/18/08    St.Jude  . Ascending aortic aneurysm repair  09/22/97    porcine aortic root  . Coronary artery bypass graft  09/22/97    LIMA to the LAD  . Abdominal hysterectomy  1966  . Kidney stone surgery  1959    left  . Nephrectomy  1958    right  . Ovarian cyst surgery  1948  . Appendectomy  1943  . Breast fibroadenoma surgery  11/89    Filed Vitals:   11/12/14 0940  BP: 152/61  Pulse: 76    Visit Diagnosis:  Abnormality of  gait  Unsteadiness  General weakness      Subjective Assessment - 11/12/14 0936    Subjective Pt reported she had an "episode" last Saturday. She reported her L arm began to hurt and her hand was shaky for about 30 minutes and her son reported pt looked pale.    Pertinent History HTN, aortic valve replacemtn, aortic aneurysm, CAD, pacemaker   Patient Stated Goals Walk with confidence    Currently in Pain? No/denies                              Balance Exercises - 11/12/14 0948    OTAGO PROGRAM   Walking and Turning Around Assistive device  required frequent cues   Sideways Walking Assistive device   Tandem Stance 10 seconds, support   Tandem Walk Support   One Leg Stand 10 seconds, support   Heel Walking Support   Toe Walk Support   Heel Toe Walking Backward --  with support   Sit to Stand --  x10 with one hand support   Stair Walking n/a           PT Education - 11/12/14 1120    Education provided Yes   Education Details Finished OTAGO and reviewed previous OTAGO  HEP, and reiterated the importance of performing HEP at home. PT educated pt on the importance of informing MD if pt experiences L arm pain again, especially with her history of heart disease.   Person(s) Educated Patient   Methods Explanation;Demonstration;Tactile cues;Verbal cues;Handout   Comprehension Verbalized understanding;Returned demonstration;Need further instruction          PT Short Term Goals - 11/03/14 1245    PT SHORT TERM GOAL #1   Title Pt will be ind. in HEP to improve balance, strength, and endurance. Target date: 11/19/14.   Status On-going   PT SHORT TERM GOAL #2   Title Pt will improve BERG score to >/=41/56 to decrease falls risk. Target date :11/19/14.   Status On-going   PT SHORT TERM GOAL #3   Title Pt will improve gait speed to >/=2.97ft/sec. with LRAD to amb. safely in the community. Target date: 11/19/14.   Status On-going   PT SHORT TERM GOAL #4    Title Pt will perform TUG with LRAD in </=13.5 seconds to decrease falls risk. Target date: 11/19/14.   Status On-going   PT SHORT TERM GOAL #5   Title Pt will amb. 300' over even terrain with LRAD at MOD I level to improve functional mobilty. Target date: 11/19/14.   Status On-going           PT Long Term Goals - 11/03/14 1245    PT LONG TERM GOAL #1   Title Pt will verbalize understanding of fall prevention strategies to decrease risk of falls. Target date: 12/17/14.   Status On-going   PT LONG TERM GOAL #2   Title Pt will improve BERG score to >/=45/56 to decrease falls risk. Target dae: 12/17/14.   Status On-going   PT LONG TERM GOAL #3   Title Pt will amb. 600' over even/uneven terrain with LRAD at MOD I level to improve functional mobility. Target date: 12/17/14.   Status On-going   PT LONG TERM GOAL #4   Title Pt will traverse 12 steps with B handrails at MOD I level to safely negotiate stairs at home. Target date: 12/17/14.   Status On-going   PT LONG TERM GOAL #5   Title Perform 6 minute walk test (MWT) and write goal if approriate. Target date: 12/17/14.   Status On-going               Plan - 11/12/14 1121    Clinical Impression Statement Pt demonstrated progress, as she required 2 seated rest breaks today 2/2 fatigue vs. frequent breaks during last session. Pt did require standing breaks during OTAGO, in order to allow time for processing. Pt would continue to benefit from skilled PT to improve safety during functional mobility.    Pt will benefit from skilled therapeutic intervention in order to improve on the following deficits Abnormal gait;Pain;Postural dysfunction;Decreased mobility;Decreased balance;Decreased knowledge of use of DME;Decreased strength   Rehab Potential Good   PT Frequency 2x / week   PT Duration 8 weeks   PT Treatment/Interventions ADLs/Self Care Home Management;Neuromuscular re-education;Biofeedback;DME Instruction;Gait training;Stair  training;Balance training;Therapeutic exercise;Therapeutic activities;Functional mobility training;Patient/family education   PT Next Visit Plan Add walking program. Provide postural education and begin to assess STGs.   PT Home Exercise Plan OTAGO   Consulted and Agree with Plan of Care Patient        Problem List Patient Active Problem List   Diagnosis Date Noted  . Status post ascending aortic aneurysm repair/AVR -  Medtronic Freestyle root 09/28/2014  .  CAD s/p CABG (LIMA to LAD) 07/08/2013  . History of aortic root repairand bioprosthetic AVR 07/08/2013  . Hyperlipidemia 07/08/2013  . SSS (sick sinus syndrome) 07/08/2013  . Second degree AV block 07/08/2013  . Pacemaker, dual chamber St. Jude 2010 07/08/2013  . Essential hypertension 07/08/2013    Kierra Jezewski L 11/12/2014, 11:25 AM  [facility] [address] [place], [place], [address] Phone: [phone]   Fax:  [phone]     Geoffry Paradise, PT,DPT 11/12/2014 11:25 AM Phone: [phone] Fax: [phone]

## 2014-11-16 DIAGNOSIS — I10 Essential (primary) hypertension: Secondary | ICD-10-CM | POA: Diagnosis not present

## 2014-11-16 DIAGNOSIS — E0590 Thyrotoxicosis, unspecified without thyrotoxic crisis or storm: Secondary | ICD-10-CM | POA: Diagnosis not present

## 2014-11-16 DIAGNOSIS — Z23 Encounter for immunization: Secondary | ICD-10-CM | POA: Diagnosis not present

## 2014-11-16 DIAGNOSIS — M810 Age-related osteoporosis without current pathological fracture: Secondary | ICD-10-CM | POA: Diagnosis not present

## 2014-11-17 ENCOUNTER — Ambulatory Visit: Payer: Medicare Other | Attending: Cardiovascular Disease

## 2014-11-17 DIAGNOSIS — R269 Unspecified abnormalities of gait and mobility: Secondary | ICD-10-CM | POA: Insufficient documentation

## 2014-11-17 DIAGNOSIS — R2681 Unsteadiness on feet: Secondary | ICD-10-CM | POA: Diagnosis not present

## 2014-11-17 DIAGNOSIS — R531 Weakness: Secondary | ICD-10-CM | POA: Insufficient documentation

## 2014-11-17 NOTE — Therapy (Signed)
[facility] [address] [place], [place], [address] Phone: [phone]   Fax:  [phone]  Physical Therapy Treatment  Patient Details  Name: Holly Bolton MRN: [id number] Date of Birth: 1922/10/10 Referring Provider:  Jani Gravel, MD  Encounter Date: 11/17/2014      PT End of Session - 11/17/14 1323    Visit Number 7   Number of Visits 17   Date for PT Re-Evaluation 12/21/14   Authorization Type G-code every 10th visit.   PT Start Time 1101   PT Stop Time 1144   PT Time Calculation (min) 43 min   Equipment Utilized During Treatment Gait belt   Activity Tolerance Patient tolerated treatment well   Behavior During Therapy WFL for tasks assessed/performed      Past Medical History  Diagnosis Date  . Hypertension   . Asthma   . Aneurysm (arteriovenous) of coronary vessels     ascending aorta requiring bypass of the LAD  . Presence of permanent cardiac pacemaker 08/26/01    Sinus node dysfunction-St.Jude  . Pacemaker generator end of life 11/18/08    Intermittent high-grade atrioventricular block  . Hyperlipemia   . Carotid stenosis 04/28/08    Doppler: <40% stenosis bilateral  . Status post ascending aortic aneurysm repair/AVR -  Medtronic Freestyle root 09/28/2014    ascending aorta requiring bypass of the LAD     Past Surgical History  Procedure Laterality Date  . Cardiac surgery    . Pacemaker insertion  08/26/01    St.Jude  . Pacemaker generator change  11/18/08    St.Jude  . Ascending aortic aneurysm repair  09/22/97    porcine aortic root  . Coronary artery bypass graft  09/22/97    LIMA to the LAD  . Abdominal hysterectomy  1966  . Kidney stone surgery  1959    left  . Nephrectomy  1958    right  . Ovarian cyst surgery  1948  . Appendectomy  1943  . Breast fibroadenoma surgery  11/89    There were no vitals filed for this visit.  Visit Diagnosis:  Abnormality of gait  Unsteadiness       Subjective Assessment - 11/17/14 1107    Subjective Pt denied falls since last visit. Pt reported she worked outside in the yard on Saturday and her L LE hurt on Sunday. Pt reported L foot feels like it could fall asleep.   Pertinent History HTN, aortic valve replacemtn, aortic aneurysm, CAD, pacemaker   Patient Stated Goals Walk with confidence    Currently in Pain? Yes   Pain Score 5    Pain Location Leg   Pain Orientation Left   Pain Descriptors / Indicators Aching;Squeezing;Pressure   Pain Type Chronic pain   Pain Onset In the past 7 days   Pain Frequency Constant   Aggravating Factors  palpation   Pain Relieving Factors aspirin and heating pad                         [facility] Adult PT Treatment/Exercise - 11/17/14 1116    Ambulation/Gait   Ambulation/Gait Yes   Ambulation/Gait Assistance 5: Supervision;6: Modified independent (Device/Increase time)   Ambulation/Gait Assistance Details Pt required cues to improve sequencing with SPC. Pt demonstrated improved posture, stride length, and gait speed.    Ambulation Distance (Feet) --  24' and 87' with SPC and 76' with rollator   Assistive device Straight cane;Rollator  Gait Pattern Step-through pattern;Decreased stride length;Decreased dorsiflexion - right;Decreased dorsiflexion - left   Ambulation Surface Level;Indoor   Standardized Balance Assessment   Standardized Balance Assessment Berg Balance Test;Timed Up and Go Test   Berg Balance Test   Sit to Stand Able to stand without using hands and stabilize independently   Standing Unsupported Able to stand safely 2 minutes   Sitting with Back Unsupported but Feet Supported on Floor or Stool Able to sit safely and securely 2 minutes   Stand to Sit Sits safely with minimal use of hands   Transfers Able to transfer safely, definite need of hands   Standing Unsupported with Eyes Closed Able to stand 10 seconds safely   Standing Ubsupported with Feet Together Able to  place feet together independently and stand for 1 minute with supervision   From Standing, Reach Forward with Outstretched Arm Can reach forward >12 cm safely (5")   From Standing Position, Pick up Object from Floor Able to pick up shoe, needs supervision   From Standing Position, Turn to Look Behind Over each Shoulder Looks behind one side only/other side shows less weight shift   Turn 360 Degrees Able to turn 360 degrees safely but slowly   Standing Unsupported, Alternately Place Feet on Step/Stool Able to complete 4 steps without aid or supervision   Standing Unsupported, One Foot in Front Able to take small step independently and hold 30 seconds   Standing on One Leg Able to lift leg independently and hold 5-10 seconds  6 seconds   Total Score 44   Timed Up and Go Test   TUG Normal TUG   Normal TUG (seconds) --  15.8sec. with SPC, 14.7sec. without SPC                PT Education - 11/17/14 1322    Education provided Yes   Education Details Discuss outcome measure results. PT also educated pt on the benefits of using rollator vs. SPC. Pt is unsure about rollator, as it makes her feel "old".   Person(s) Educated Patient   Methods Explanation;Demonstration   Comprehension Verbalized understanding;Need further instruction          PT Short Term Goals - 11/17/14 1325    PT SHORT TERM GOAL #1   Title Pt will be ind. in HEP to improve balance, strength, and endurance. Target date: 11/19/14.   Status On-going   PT SHORT TERM GOAL #2   Title Pt will improve BERG score to >/=41/56 to decrease falls risk. Target date :11/19/14.   Status Achieved   PT SHORT TERM GOAL #3   Title Pt will improve gait speed to >/=2.97f/sec. with LRAD to amb. safely in the community. Target date: 11/19/14.   Status On-going   PT SHORT TERM GOAL #4   Title Pt will perform TUG with LRAD in </=13.5 seconds to decrease falls risk. Target date: 11/19/14.   Status Not Met   PT SHORT TERM GOAL #5   Title  Pt will amb. 300' over even terrain with LRAD at MOD I level to improve functional mobilty. Target date: 11/19/14.   Status Partially Met           PT Long Term Goals - 11/17/14 1325    PT LONG TERM GOAL #1   Title Pt will verbalize understanding of fall prevention strategies to decrease risk of falls. Target date: 12/17/14.   Status On-going   PT LONG TERM GOAL #2   Title Pt will improve  BERG score to >/=48/56 to decrease falls risk. Target dae: 12/17/14.   Baseline Revised on 11/17/14 due to pt's score of 44/56 will assessing STGs.   Status Revised   PT LONG TERM GOAL #3   Title Pt will amb. 600' over even/uneven terrain with LRAD at MOD I level to improve functional mobility. Target date: 12/17/14.   Status On-going   PT LONG TERM GOAL #4   Title Pt will traverse 12 steps with B handrails at MOD I level to safely negotiate stairs at home. Target date: 12/17/14.   Status On-going   PT LONG TERM GOAL #5   Title Perform 6 minute walk test (MWT) and write goal if approriate. Target date: 12/17/14.   Status On-going               Plan - 11/17/14 1323    Clinical Impression Statement Pt met STG 2, and PT revised LTG 2 due to pt progress. Pt partially met STG 5, as she progressed to MOD I level with rollator during last 100' of gait but does require superivision and cues when using SPC. PT will finish assessing STGs next session. PT palpated L ant. tibialis and peroneal LE muscles, with increased pain upon palpation of distal ant. tibialis, which likely indicates pain is muscle related. However, PT encouraged pt to notify MD if pain increases, as pt does have history of PAD. Pt reports she has an appointment with Dr. Gwenlyn Found regarding PAD on 12/08/14. Continue with POC.   Pt will benefit from skilled therapeutic intervention in order to improve on the following deficits Abnormal gait;Pain;Postural dysfunction;Decreased mobility;Decreased balance;Decreased knowledge of use of DME;Decreased  strength   Rehab Potential Good   PT Frequency 2x / week   PT Duration 8 weeks   PT Treatment/Interventions ADLs/Self Care Home Management;Neuromuscular re-education;Biofeedback;DME Instruction;Gait training;Stair training;Balance training;Therapeutic exercise;Therapeutic activities;Functional mobility training;Patient/family education   PT Next Visit Plan Assess STG 1 and 3. Gait training with rollator and SPC (sequencing).   PT Home Exercise Plan OTAGO   Consulted and Agree with Plan of Care Patient        Problem List Patient Active Problem List   Diagnosis Date Noted  . Status post ascending aortic aneurysm repair/AVR -  Medtronic Freestyle root 09/28/2014  . CAD s/p CABG (LIMA to LAD) 07/08/2013  . History of aortic root repairand bioprosthetic AVR 07/08/2013  . Hyperlipidemia 07/08/2013  . SSS (sick sinus syndrome) ([place]) 07/08/2013  . Second degree AV block 07/08/2013  . Pacemaker, dual chamber St. Jude 2010 07/08/2013  . Essential hypertension 07/08/2013    Erikson Danzy L 11/17/2014, 1:28 PM  [facility] [address] [place], [place], [address] Phone: [phone]   Fax:  [phone]     Geoffry Paradise, PT,DPT 11/17/2014 1:28 PM Phone: [phone] Fax: [phone]

## 2014-11-19 ENCOUNTER — Ambulatory Visit: Payer: Medicare Other

## 2014-11-19 DIAGNOSIS — R531 Weakness: Secondary | ICD-10-CM | POA: Diagnosis not present

## 2014-11-19 DIAGNOSIS — M810 Age-related osteoporosis without current pathological fracture: Secondary | ICD-10-CM | POA: Diagnosis not present

## 2014-11-19 DIAGNOSIS — R2681 Unsteadiness on feet: Secondary | ICD-10-CM | POA: Diagnosis not present

## 2014-11-19 DIAGNOSIS — E0590 Thyrotoxicosis, unspecified without thyrotoxic crisis or storm: Secondary | ICD-10-CM | POA: Diagnosis not present

## 2014-11-19 DIAGNOSIS — R269 Unspecified abnormalities of gait and mobility: Secondary | ICD-10-CM | POA: Diagnosis not present

## 2014-11-19 DIAGNOSIS — I10 Essential (primary) hypertension: Secondary | ICD-10-CM | POA: Diagnosis not present

## 2014-11-19 DIAGNOSIS — D649 Anemia, unspecified: Secondary | ICD-10-CM | POA: Diagnosis not present

## 2014-11-19 NOTE — Therapy (Signed)
[facility] [address] [place], [place], [address] Phone: [phone]   Fax:  [phone]  Physical Therapy Treatment  Patient Details  Name: Holly Bolton MRN: [id number] Date of Birth: 07/31/1922 Referring Provider:  Jani Gravel, MD  Encounter Date: 11/19/2014      PT End of Session - 11/19/14 1159    Visit Number 8   Number of Visits 17   Date for PT Re-Evaluation 12/21/14   Authorization Type G-code every 10th visit.   PT Start Time 1101   PT Stop Time 1144   PT Time Calculation (min) 43 min   Equipment Utilized During Treatment Gait belt   Activity Tolerance Patient tolerated treatment well   Behavior During Therapy WFL for tasks assessed/performed      Past Medical History  Diagnosis Date  . Hypertension   . Asthma   . Aneurysm (arteriovenous) of coronary vessels     ascending aorta requiring bypass of the LAD  . Presence of permanent cardiac pacemaker 08/26/01    Sinus node dysfunction-St.Jude  . Pacemaker generator end of life 11/18/08    Intermittent high-grade atrioventricular block  . Hyperlipemia   . Carotid stenosis 04/28/08    Doppler: <40% stenosis bilateral  . Status post ascending aortic aneurysm repair/AVR -  Medtronic Freestyle root 09/28/2014    ascending aorta requiring bypass of the LAD     Past Surgical History  Procedure Laterality Date  . Cardiac surgery    . Pacemaker insertion  08/26/01    St[employer]Jude  . Pacemaker generator change  11/18/08    [employer]Jude  . Ascending aortic aneurysm repair  09/22/97    porcine aortic root  . Coronary artery bypass graft  09/22/97    LIMA to the LAD  . Abdominal hysterectomy  1966  . Kidney stone surgery  1959    left  . Nephrectomy  1958    right  . Ovarian cyst surgery  1948  . Appendectomy  1943  . Breast fibroadenoma surgery  11/89    There were no vitals filed for this visit.  Visit Diagnosis:  Abnormality of gait      Subjective  Assessment - 11/19/14 1106    Subjective Pt denied falls since last visit. Pt reported she has an appt. with MD at 2:30pm today, for a regular check-up.   Pertinent History HTN, aortic valve replacemtn, aortic aneurysm, CAD, pacemaker   Patient Stated Goals Walk with confidence    Currently in Pain? Yes   Pain Score --  1-2/10   Pain Location Leg   Pain Orientation Left   Pain Descriptors / Indicators Aching;Pressure;Squeezing   Pain Type Chronic pain   Pain Radiating Towards ankle   Pain Onset In the past 7 days   Pain Frequency Constant   Aggravating Factors  palpation   Pain Relieving Factors aspirin and heating pad                         [facility] Adult PT Treatment/Exercise - 11/19/14 1108    Ambulation/Gait   Ambulation/Gait Yes   Ambulation/Gait Assistance 5: Supervision;6: Modified independent (Device/Increase time)   Ambulation/Gait Assistance Details Pt ambulated over even/uneven terrain with rollator, and even terrain with SPC and no AD. Pt required tactile, VC's and demonstration for sequencing with SPC. Cues to improve stride length, decr. narrow BOS, and improve B heel strike. Pt required seated rest breaks 2/2 fatigue.   Ambulation Distance (  Feet) 500 Feet  outdoors with rollator, 67' and 117' with SPC, 50' no AD   Assistive device Straight cane;Rollator;None   Gait Pattern Step-through pattern;Decreased stride length;Decreased dorsiflexion - right;Decreased dorsiflexion - left   Ambulation Surface Level;Unlevel;Indoor;Outdoor;Paved   Gait velocity 2.70f/sec with SPC, 2.157fsec.; 2.62109fec with rollator.                PT Education - 11/19/14 1157    Education provided Yes   Education Details PT reiterated the importance of performing HEP as prescribed, as pt reported she is having difficulty finding time to perform. PT suggested performing HEP in segments vs. all exercises at one time. PT also educated pt on improved safety and improve gait  when using rollator vs. using SPC/no AD. Pt educated pt on pursed lip breathing and decr. SaO2.   Person(s) Educated Patient   Methods Explanation   Comprehension Verbalized understanding;Returned demonstration          PT Short Term Goals - 11/19/14 1203    PT SHORT TERM GOAL #1   Title Pt will be ind. in HEP to improve balance, strength, and endurance. Target date: 11/19/14.   Status Partially Met   PT SHORT TERM GOAL #2   Title Pt will improve BERG score to >/=41/56 to decrease falls risk. Target date :11/19/14.   Status Achieved   PT SHORT TERM GOAL #3   Title Pt will improve gait speed to >/=2.27f43fc. with LRAD to amb. safely in the community. Target date: 11/19/14.   Baseline achieved with rollator but not SPC   Status Achieved   PT SHORT TERM GOAL #4   Title Pt will perform TUG with LRAD in </=13.5 seconds to decrease falls risk. Target date: 11/19/14.   Status Not Met   PT SHORT TERM GOAL #5   Title Pt will amb. 300' over even terrain with LRAD at MOD I level to improve functional mobilty. Target date: 11/19/14.   Status Partially Met           PT Long Term Goals - 11/17/14 1325    PT LONG TERM GOAL #1   Title Pt will verbalize understanding of fall prevention strategies to decrease risk of falls. Target date: 12/17/14.   Status On-going   PT LONG TERM GOAL #2   Title Pt will improve BERG score to >/=48/56 to decrease falls risk. Target dae: 12/17/14.   Baseline Revised on 11/17/14 due to pt's score of 44/56 will assessing STGs.   Status Revised   PT LONG TERM GOAL #3   Title Pt will amb. 600' over even/uneven terrain with LRAD at MOD I level to improve functional mobility. Target date: 12/17/14.   Status On-going   PT LONG TERM GOAL #4   Title Pt will traverse 12 steps with B handrails at MOD I level to safely negotiate stairs at home. Target date: 12/17/14.   Status On-going   PT LONG TERM GOAL #5   Title Perform 6 minute walk test (MWT) and write goal if approriate.  Target date: 12/17/14.   Status On-going               Plan - 11/19/14 1159    Clinical Impression Statement Pt demonstrated progress as she met STG 3. PT partially met STG 1, as she reported she's not performing as prescribed due to time constraints. Pt demonstrated improve safey and gait speed when ambulating with rollator vs. SPC. Pt's SaO2 on room air was 87% after amb. and systolic  BP was elevated after amb: 157/76. SaO2 improved to 92% with seated rest break and pursed lip breathing. PT will notify MD, as pt's sees PCP today. Continue with POC.   Pt will benefit from skilled therapeutic intervention in order to improve on the following deficits Abnormal gait;Pain;Postural dysfunction;Decreased mobility;Decreased balance;Decreased knowledge of use of DME;Decreased strength   Rehab Potential Good   PT Frequency 2x / week   PT Duration 8 weeks   PT Treatment/Interventions ADLs/Self Care Home Management;Neuromuscular re-education;Biofeedback;DME Instruction;Gait training;Stair training;Balance training;Therapeutic exercise;Therapeutic activities;Functional mobility training;Patient/family education   PT Next Visit Plan Review HEP to ensure pt is performing OTAGO correctly. Gait training with rollator over uneven terrain and SPC (sequencing).   PT Home Exercise Plan OTAGO   Consulted and Agree with Plan of Care Patient        Problem List Patient Active Problem List   Diagnosis Date Noted  . Status post ascending aortic aneurysm repair/AVR -  Medtronic Freestyle root 09/28/2014  . CAD s/p CABG (LIMA to LAD) 07/08/2013  . History of aortic root repairand bioprosthetic AVR 07/08/2013  . Hyperlipidemia 07/08/2013  . SSS (sick sinus syndrome) ([place]) 07/08/2013  . Second degree AV block 07/08/2013  . Pacemaker, dual chamber St. Jude 2010 07/08/2013  . Essential hypertension 07/08/2013    Miller,Jennifer L 11/19/2014, 12:04 PM  [facility] [address] [place], [place], [address] Phone: [phone]   Fax:  [phone]     Geoffry Paradise, PT,DPT 11/19/2014 12:04 PM Phone: [phone] Fax: [phone]

## 2014-11-24 ENCOUNTER — Ambulatory Visit: Payer: Medicare Other

## 2014-11-24 VITALS — HR 70

## 2014-11-24 DIAGNOSIS — R531 Weakness: Secondary | ICD-10-CM

## 2014-11-24 DIAGNOSIS — R269 Unspecified abnormalities of gait and mobility: Secondary | ICD-10-CM

## 2014-11-24 DIAGNOSIS — R2681 Unsteadiness on feet: Secondary | ICD-10-CM

## 2014-11-24 NOTE — Therapy (Signed)
[facility] [address] [place], [place], [address] Phone: [phone]   Fax:  [phone]  Physical Therapy Treatment  Patient Details  Name: Holly Bolton MRN: [id number] Date of Birth: 12/29/1922 Referring Provider:  Jani Gravel, MD  Encounter Date: 11/24/2014      PT End of Session - 11/24/14 1017    Visit Number 9   Number of Visits 17   Date for PT Re-Evaluation 12/21/14   Authorization Type G-code every 10th visit.   PT Start Time [phone]   PT Stop Time 1014   PT Time Calculation (min) 41 min   Equipment Utilized During Treatment Gait belt   Activity Tolerance Patient tolerated treatment well   Behavior During Therapy WFL for tasks assessed/performed      Past Medical History  Diagnosis Date  . Hypertension   . Asthma   . Aneurysm (arteriovenous) of coronary vessels     ascending aorta requiring bypass of the LAD  . Presence of permanent cardiac pacemaker 08/26/01    Sinus node dysfunction-St.Jude  . Pacemaker generator end of life 11/18/08    Intermittent high-grade atrioventricular block  . Hyperlipemia   . Carotid stenosis 04/28/08    Doppler: <40% stenosis bilateral  . Status post ascending aortic aneurysm repair/AVR -  Medtronic Freestyle root 09/28/2014    ascending aorta requiring bypass of the LAD     Past Surgical History  Procedure Laterality Date  . Cardiac surgery    . Pacemaker insertion  08/26/01    [employer]Jude  . Pacemaker generator change  11/18/08    [employer]Jude  . Ascending aortic aneurysm repair  09/22/97    porcine aortic root  . Coronary artery bypass graft  09/22/97    LIMA to the LAD  . Abdominal hysterectomy  1966  . Kidney stone surgery  1959    left  . Nephrectomy  1958    right  . Ovarian cyst surgery  1948  . Appendectomy  1943  . Breast fibroadenoma surgery  11/89    Filed Vitals:   11/24/14 1008  Pulse: 70  SpO2: 95%    Visit Diagnosis:  Abnormality of  gait  General weakness  Unsteadiness      Subjective Assessment - 11/24/14 0936    Subjective Pt denied falls since last visit. Pt reported she has performed only 1 HEP exercise since last visit due to time constraints. Pt reported her L ankle area continues to hurt after Sunday school/church on Sundays, she sees Dr. Gwenlyn Found this month to check circulation.   Pertinent History HTN, aortic valve replacemtn, aortic aneurysm, CAD, pacemaker   Patient Stated Goals Walk with confidence    Currently in Pain? No/denies                              Balance Exercises - 11/24/14 0937    OTAGO PROGRAM   Head Movements Standing;5 reps   Back Extension 5 reps;Standing   Trunk Movements Standing;5 reps   Ankle Movements 10 reps;Sitting   Knee Extensor 10 reps;Weight (comment)  yellow band   Knee Flexor 10 reps   Hip ABductor 5 reps   Ankle Plantorflexors 20 reps, support   Ankle Dorsiflexors 20 reps, support   Knee Bends 10 reps, support  2x5reps   Backwards Walking Support   Walking and Turning Around --  n/a   Sideways Walking Assistive device  support at counter  Tandem Stance 10 seconds, support  lightheaded after this    Tandem Walk Support   One Leg Stand 10 seconds, support   Heel Walking Support   Toe Walk Support   Heel Toe Walking Backward --  n/a   Stair Walking n/a   Overall OTAGO Comments Cues and demonstration for technique. Pt required 3 seated rest breaks 2/2 fatigue.           PT Education - 11/24/14 1016    Education provided Yes   Education Details Reviewed and performed OTAGO, as pt reports she hasn't had time to perform at home. PT asked pt to perform at least 2x/week during the next week (not on PT days), vs. 3x/week. PT educated pt on the importance of performing HEP to improve strength, balance, and safety.   Person(s) Educated Patient   Methods Explanation;Demonstration;Tactile cues;Verbal cues;Handout   Comprehension Returned  demonstration;Verbalized understanding;Need further instruction          PT Short Term Goals - 11/19/14 1203    PT SHORT TERM GOAL #1   Title Pt will be ind. in HEP to improve balance, strength, and endurance. Target date: 11/19/14.   Status Partially Met   PT SHORT TERM GOAL #2   Title Pt will improve BERG score to >/=41/56 to decrease falls risk. Target date :11/19/14.   Status Achieved   PT SHORT TERM GOAL #3   Title Pt will improve gait speed to >/=2.59f/sec. with LRAD to amb. safely in the community. Target date: 11/19/14.   Baseline achieved with rollator but not SPC   Status Achieved   PT SHORT TERM GOAL #4   Title Pt will perform TUG with LRAD in </=13.5 seconds to decrease falls risk. Target date: 11/19/14.   Status Not Met   PT SHORT TERM GOAL #5   Title Pt will amb. 300' over even terrain with LRAD at MOD I level to improve functional mobilty. Target date: 11/19/14.   Status Partially Met           PT Long Term Goals - 11/17/14 1325    PT LONG TERM GOAL #1   Title Pt will verbalize understanding of fall prevention strategies to decrease risk of falls. Target date: 12/17/14.   Status On-going   PT LONG TERM GOAL #2   Title Pt will improve BERG score to >/=48/56 to decrease falls risk. Target dae: 12/17/14.   Baseline Revised on 11/17/14 due to pt's score of 44/56 will assessing STGs.   Status Revised   PT LONG TERM GOAL #3   Title Pt will amb. 600' over even/uneven terrain with LRAD at MOD I level to improve functional mobility. Target date: 12/17/14.   Status On-going   PT LONG TERM GOAL #4   Title Pt will traverse 12 steps with B handrails at MOD I level to safely negotiate stairs at home. Target date: 12/17/14.   Status On-going   PT LONG TERM GOAL #5   Title Perform 6 minute walk test (MWT) and write goal if approriate. Target date: 12/17/14.   Status On-going               Plan - 11/24/14 1018    Clinical Impression Statement Pt continues to require rest  breaks 2/2 fatigue. Pt demonstrating progress, as she was able to perform SLS with less UE support. Continue with POC.   Pt will benefit from skilled therapeutic intervention in order to improve on the following deficits Abnormal gait;Pain;Postural dysfunction;Decreased mobility;Decreased balance;Decreased  knowledge of use of DME;Decreased strength   Rehab Potential Good   PT Frequency 2x / week   PT Duration 8 weeks   PT Treatment/Interventions ADLs/Self Care Home Management;Neuromuscular re-education;Biofeedback;DME Instruction;Gait training;Stair training;Balance training;Therapeutic exercise;Therapeutic activities;Functional mobility training;Patient/family education   PT Next Visit Plan G-CODE. Gait training with SPC and rollator.   PT Home Exercise Plan OTAGO   Consulted and Agree with Plan of Care Patient        Problem List Patient Active Problem List   Diagnosis Date Noted  . Status post ascending aortic aneurysm repair/AVR -  Medtronic Freestyle root 09/28/2014  . CAD s/p CABG (LIMA to LAD) 07/08/2013  . History of aortic root repairand bioprosthetic AVR 07/08/2013  . Hyperlipidemia 07/08/2013  . SSS (sick sinus syndrome) ([place]) 07/08/2013  . Second degree AV block 07/08/2013  . Pacemaker, dual chamber St. Jude 2010 07/08/2013  . Essential hypertension 07/08/2013    Noelly Lasseigne L 11/24/2014, 10:19 AM  [facility] [address] [place], [place], [address] Phone: [phone]   Fax:  [phone]     Geoffry Paradise, PT,DPT 11/24/2014 10:19 AM Phone: [phone] Fax: [phone]

## 2014-11-26 ENCOUNTER — Ambulatory Visit: Payer: Medicare Other

## 2014-11-26 NOTE — Therapy (Signed)
[facility] [address] [place], [place], [address] Phone: [phone]   Fax:  [phone]  Patient Details  Name: Holly Bolton MRN: [id number] Date of Birth: [birth year] Referring Provider:  No ref. provider found  Encounter Date: 02-Dec-2014  PHYSICAL THERAPY DISCHARGE SUMMARY  Visits from Start of Care: 9  Current functional level related to goals / functional outcomes:     PT Short Term Goals - 11/19/14 1203    PT SHORT TERM GOAL #1   Title Pt will be ind. in HEP to improve balance, strength, and endurance. Target date: 11/19/14.   Status Partially Met   PT SHORT TERM GOAL #2   Title Pt will improve BERG score to >/=41/56 to decrease falls risk. Target date :11/19/14.   Status Achieved   PT SHORT TERM GOAL #3   Title Pt will improve gait speed to >/=2.17f/sec. with LRAD to amb. safely in the community. Target date: 11/19/14.   Baseline achieved with rollator but not SPC   Status Achieved   PT SHORT TERM GOAL #4   Title Pt will perform TUG with LRAD in </=13.5 seconds to decrease falls risk. Target date: 11/19/14.   Status Not Met   PT SHORT TERM GOAL #5   Title Pt will amb. 300' over even terrain with LRAD at MOD I level to improve functional mobilty. Target date: 11/19/14.   Status Partially Met         PT Long Term Goals - 11/17/14 1325    PT LONG TERM GOAL #1   Title Pt will verbalize understanding of fall prevention strategies to decrease risk of falls. Target date: 12/17/14.   Status On-going   PT LONG TERM GOAL #2   Title Pt will improve BERG score to >/=48/56 to decrease falls risk. Target dae: 12/17/14.   Baseline Revised on 11/17/14 due to pt's score of 44/56 will assessing STGs.   Status Revised   PT LONG TERM GOAL #3   Title Pt will amb. 600' over even/uneven terrain with LRAD at MOD I level to improve functional mobility. Target date: 12/17/14.   Status On-going   PT LONG TERM GOAL #4   Title Pt  will traverse 12 steps with B handrails at MOD I level to safely negotiate stairs at home. Target date: 12/17/14.   Status On-going   PT LONG TERM GOAL #5   Title Perform 6 minute walk test (MWT) and write goal if approriate. Target date: 12/17/14.   Status On-going        Remaining deficits: Impaired balance, gait deviations and decreased strength and endurance. Pt called and cancelled remaining visits due to increased L LE pain, pt has an appt. With Dr. BGwenlyn Foundto assess vascular system. Pt wished to d/c from PT.   Education / Equipment: HEP and educated pt that ambulating with an AD is safest, and encouraged pt to use rollator vs. SPC.  Plan: Patient agrees to discharge.  Patient goals were partially met. Patient is being discharged due to the patient's request.  ?????       JGeoffry Paradise PT,DPT 12016/10/199:45 AM Phone: [phone]: [phone]     G-Codes - 110/19/160944    Functional Assessment Tool Used BERG: 44/56; TUG no AD: 15.7 sec. and 14.7 sec. with SPC; gait speed: 2.167fsec. no AD, 2.3440fec. with SPC and 2.29f58fc. with rollator   Functional Limitation Mobility: Walking and moving around   Mobility: Walking and Moving Around Goal Status (  [id number]) At least 1 percent but less than 20 percent impaired, limited or restricted   Mobility: Walking and Moving Around Discharge Status [phone]) At least 20 percent but less than 40 percent impaired, limited or restricted        Holly Bolton L 11/26/2014, 9:42 AM  [facility] [address] [place], [place], [address] Phone: [phone]   Fax:  [phone]

## 2014-12-01 ENCOUNTER — Ambulatory Visit: Payer: Medicare Other

## 2014-12-03 ENCOUNTER — Ambulatory Visit: Payer: Medicare Other

## 2014-12-08 ENCOUNTER — Ambulatory Visit (INDEPENDENT_AMBULATORY_CARE_PROVIDER_SITE_OTHER): Payer: Medicare Other | Admitting: Cardiovascular Disease

## 2014-12-08 ENCOUNTER — Encounter: Payer: Self-pay | Admitting: Cardiovascular Disease

## 2014-12-08 ENCOUNTER — Ambulatory Visit: Payer: Medicare Other

## 2014-12-08 VITALS — BP 130/58 | HR 69 | Ht 60.0 in | Wt 107.0 lb

## 2014-12-08 DIAGNOSIS — I739 Peripheral vascular disease, unspecified: Secondary | ICD-10-CM | POA: Diagnosis not present

## 2014-12-08 DIAGNOSIS — I2510 Atherosclerotic heart disease of native coronary artery without angina pectoris: Secondary | ICD-10-CM

## 2014-12-08 NOTE — Patient Instructions (Signed)
Medication Instructions:  Your physician recommends that you continue on your current medications as directed. Please refer to the Current Medication list given to you today.   Labwork: none  Testing/Procedures: none  Follow-Up: Follow up with Dr. Berry as needed.   Any Other Special Instructions Will Be Listed Below (If Applicable).     If you need a refill on your cardiac medications before your next appointment, please call your pharmacy.   

## 2014-12-08 NOTE — Assessment & Plan Note (Addendum)
The patient was referred to me by Dr. Sallyanne Kuster for evaluation of PAD. She does have a history of CAD. She's had progressive left lower external claudication of the last year with a decline in her left ABI from 0.94 -.78 although her left iliac velocities are not significantly elevated. Her symptoms are fairly classic. She does have a 2+ left common femoral pulse without a significant bruit. Her serum creatinine is 1.5 with a creatinine clearance in the 35 mm range making a CT scan problematic and angiography as well especially in light of the fact that she has a solitary kidney. Based on all of these factors I'm inclined not to pursue revascularization at this time.

## 2014-12-08 NOTE — Progress Notes (Signed)
12/08/2014 [place]   [birth year]  [id number]  Primary Physician Jani Gravel, MD Primary Cardiologist: Lorretta Harp MD Renae Gloss   HPI:  Holly Bolton is a 81 year old thin and frail-appearing Caucasian female accompanied by her son today. She was referred to me by Dr. Sallyanne Kuster for peripheral vascular evaluation because of lifestyle limiting claudication especially in her left leg. She has a history of coronary artery bypass grafting as well as aortic valve replacement and aortic root replacement 1999. She had lower extremity arterial Doppler studies performed one year ago revealed a right ABI of 0.94 which is declined 0.78 on her recent Dopplers performed one month ago. Her velocities however are not significantly elevated and her serum creatinine recently drawn was 1.5 giving her a clearance of 30 mL/m.   Current Outpatient Prescriptions  Medication Sig Dispense Refill  . ALPHAGAN P 0.1 % SOLN Place 1 application into both eyes 2 (two) times daily.  1  . aspirin 81 MG tablet Take 81 mg by mouth daily.    . cholecalciferol (VITAMIN D) 1000 UNITS tablet Take 2,000 Units by mouth 2 (two) times daily.    . CO ENZYME Q-10 PO Take 1 capsule by mouth daily.    . furosemide (LASIX) 20 MG tablet Take 1 tablet (20 mg total) by mouth 2 (two) times a week. 30 tablet 11  . losartan (COZAAR) 50 MG tablet Take 50 mg by mouth daily.  3  . Magnesium 250 MG TABS Take 250 mg by mouth daily.    . metoprolol tartrate (LOPRESSOR) 25 MG tablet Take 25 mg by mouth daily.  3  . vitamin B-12 (CYANOCOBALAMIN) 1000 MCG tablet Take 1,000 mcg by mouth daily.     No current facility-administered medications for this visit.    Allergies  Allergen Reactions  . Accupril [Quinapril Hcl]   . Amlodipine Swelling  . Benadryl [Diphenhydramine Hcl]   . Biaxin [Clarithromycin]   . Ciprofloxacin   . Codeine   . Diovan [Valsartan]   . Medrol [Methylprednisolone]   . Morphine And Related   .  Neurontin [Gabapentin]   . Penicillins     Social History   Social History  . Marital Status: Widowed    Spouse Name: N/A  . Number of Children: N/A  . Years of Education: N/A   Occupational History  . Not on file.   Social History Main Topics  . Smoking status: Never Smoker   . Smokeless tobacco: Not on file  . Alcohol Use: No  . Drug Use: Not on file  . Sexual Activity: Not on file   Other Topics Concern  . Not on file   Social History Narrative     Review of Systems: General: negative for chills, fever, night sweats or weight changes.  Cardiovascular: negative for chest pain, dyspnea on exertion, edema, orthopnea, palpitations, paroxysmal nocturnal dyspnea or shortness of breath Dermatological: negative for rash Respiratory: negative for cough or wheezing Urologic: negative for hematuria Abdominal: negative for nausea, vomiting, diarrhea, bright red blood per rectum, melena, or hematemesis Neurologic: negative for visual changes, syncope, or dizziness All other systems reviewed and are otherwise negative except as noted above.    Blood pressure 130/58, pulse 69, height 5' (1.524 m), weight 107 lb (48.535 kg).  General appearance: alert and no distress Neck: no adenopathy, no carotid bruit, no JVD, supple, symmetrical, trachea midline and thyroid not enlarged, symmetric, no tenderness/mass/nodules Lungs: clear to auscultation bilaterally Heart: regular rate and  rhythm, S1, S2 normal, no murmur, click, rub or gallop Extremities: extremities normal, atraumatic, no cyanosis or edema and 2+ femoral pulses without bruits  EKG not performed today  ASSESSMENT AND PLAN:   Peripheral arterial disease ([place]) The patient was referred to me by Dr. Sallyanne Kuster for evaluation of PAD. She does have a history of CAD. She's had progressive left lower external claudication of the last year with a decline in her left ABI from 0.94 -.78 although her left iliac velocities are not  significantly elevated. Her symptoms are fairly classic. She does have a 2+ left common femoral pulse without a significant bruit. Her serum creatinine is 1.5 with a creatinine clearance in the 35 mm range making a CT scan problematic and angiography as well especially in light of the fact that she has a solitary kidney. Based on all of these factors I'm inclined not to pursue revascularization at this time.      Lorretta Harp MD FACP,FACC,FAHA, [facility] 12/08/2014 3:21 PM

## 2014-12-08 NOTE — Progress Notes (Signed)
Thanks, Roderic Palau

## 2014-12-10 ENCOUNTER — Ambulatory Visit: Payer: Medicare Other

## 2014-12-15 ENCOUNTER — Ambulatory Visit: Payer: Medicare Other

## 2014-12-17 ENCOUNTER — Ambulatory Visit: Payer: Medicare Other

## 2014-12-17 DIAGNOSIS — R0781 Pleurodynia: Secondary | ICD-10-CM | POA: Diagnosis not present

## 2014-12-17 DIAGNOSIS — R2681 Unsteadiness on feet: Secondary | ICD-10-CM | POA: Diagnosis not present

## 2014-12-17 DIAGNOSIS — M546 Pain in thoracic spine: Secondary | ICD-10-CM | POA: Diagnosis not present

## 2014-12-30 ENCOUNTER — Ambulatory Visit (INDEPENDENT_AMBULATORY_CARE_PROVIDER_SITE_OTHER): Payer: Medicare Other | Admitting: *Deleted

## 2014-12-30 DIAGNOSIS — I495 Sick sinus syndrome: Secondary | ICD-10-CM | POA: Diagnosis not present

## 2014-12-30 NOTE — Progress Notes (Signed)
Remote pacemaker transmission.   

## 2014-12-31 ENCOUNTER — Telehealth: Payer: Self-pay | Admitting: Cardiovascular Disease

## 2014-12-31 NOTE — Telephone Encounter (Signed)
Pt had 2 charts -no information in this chart

## 2014-12-31 NOTE — Telephone Encounter (Signed)
Please call,pts having cramps

## 2014-12-31 NOTE — Telephone Encounter (Signed)
Please call,been having cramps in her left leg.

## 2014-12-31 NOTE — Telephone Encounter (Signed)
Spoke with pt she stated that she took ASA and applied heat to left leg they got better.  This afternoon it started again and has since decreased in severity.  Pt stated that it starts mid-calf and radiates down to her ankle. She does not have any tenderness to touch or redness to the area that is abnormal.  Pt stated she wants to know if there is anything else she can do. Pt instructed that she can elevated leg along with wear some compression stockings.  Told pt that i would forward her concerns to Dr. Gwenlyn Found to review and call her back if he suggests anything more.  Pt verbalized understanding, no questions at this time.

## 2015-01-03 NOTE — Telephone Encounter (Signed)
It appears most of her disease is tibial. At her age and with her moderate renal insuff I don't think that there are interventional options. I suggest analgesics. I would reserve angio for CLI

## 2015-01-12 DIAGNOSIS — I129 Hypertensive chronic kidney disease with stage 1 through stage 4 chronic kidney disease, or unspecified chronic kidney disease: Secondary | ICD-10-CM | POA: Diagnosis not present

## 2015-01-12 DIAGNOSIS — I2510 Atherosclerotic heart disease of native coronary artery without angina pectoris: Secondary | ICD-10-CM | POA: Diagnosis not present

## 2015-01-12 DIAGNOSIS — N183 Chronic kidney disease, stage 3 (moderate): Secondary | ICD-10-CM | POA: Diagnosis not present

## 2015-01-12 DIAGNOSIS — J45909 Unspecified asthma, uncomplicated: Secondary | ICD-10-CM | POA: Diagnosis not present

## 2015-01-12 DIAGNOSIS — M546 Pain in thoracic spine: Secondary | ICD-10-CM | POA: Diagnosis not present

## 2015-01-12 DIAGNOSIS — R2681 Unsteadiness on feet: Secondary | ICD-10-CM | POA: Diagnosis not present

## 2015-01-14 DIAGNOSIS — H353110 Nonexudative age-related macular degeneration, right eye, stage unspecified: Secondary | ICD-10-CM | POA: Diagnosis not present

## 2015-01-14 DIAGNOSIS — H401131 Primary open-angle glaucoma, bilateral, mild stage: Secondary | ICD-10-CM | POA: Diagnosis not present

## 2015-01-14 DIAGNOSIS — H524 Presbyopia: Secondary | ICD-10-CM | POA: Diagnosis not present

## 2015-01-15 LAB — CUP PACEART REMOTE DEVICE CHECK
Battery Remaining Longevity: 54 mo
Battery Remaining Percentage: 46 %
Battery Voltage: 2.86 V
Brady Statistic AP VP Percent: 63 %
Brady Statistic AP VS Percent: 26 %
Brady Statistic AS VP Percent: 1.6 %
Brady Statistic AS VS Percent: 9.1 %
Brady Statistic RA Percent Paced: 88 %
Brady Statistic RV Percent Paced: 65 %
Date Time Interrogation Session: 20161116151317
Implantable Lead Implant Date: 20030714
Implantable Lead Implant Date: 20030714
Implantable Lead Location: 753859
Implantable Lead Location: 753860
Lead Channel Impedance Value: 360 Ohm
Lead Channel Impedance Value: 460 Ohm
Lead Channel Pacing Threshold Amplitude: 0.5 V
Lead Channel Pacing Threshold Amplitude: 0.625 V
Lead Channel Pacing Threshold Pulse Width: 0.5 ms
Lead Channel Pacing Threshold Pulse Width: 0.5 ms
Lead Channel Sensing Intrinsic Amplitude: 1.3 mV
Lead Channel Sensing Intrinsic Amplitude: 1.9 mV
Lead Channel Setting Pacing Amplitude: 0.875
Lead Channel Setting Pacing Amplitude: 1.5 V
Lead Channel Setting Pacing Pulse Width: 0.5 ms
Lead Channel Setting Sensing Sensitivity: 1 mV
Pulse Gen Model: 2110
Pulse Gen Serial Number: 2298006

## 2015-01-18 ENCOUNTER — Encounter: Payer: Self-pay | Admitting: Cardiology

## 2015-02-17 DIAGNOSIS — E0590 Thyrotoxicosis, unspecified without thyrotoxic crisis or storm: Secondary | ICD-10-CM | POA: Diagnosis not present

## 2015-02-17 DIAGNOSIS — I10 Essential (primary) hypertension: Secondary | ICD-10-CM | POA: Diagnosis not present

## 2015-02-22 DIAGNOSIS — I10 Essential (primary) hypertension: Secondary | ICD-10-CM | POA: Diagnosis not present

## 2015-02-22 DIAGNOSIS — D649 Anemia, unspecified: Secondary | ICD-10-CM | POA: Diagnosis not present

## 2015-02-22 DIAGNOSIS — E0590 Thyrotoxicosis, unspecified without thyrotoxic crisis or storm: Secondary | ICD-10-CM | POA: Diagnosis not present

## 2015-02-24 DIAGNOSIS — E0590 Thyrotoxicosis, unspecified without thyrotoxic crisis or storm: Secondary | ICD-10-CM | POA: Diagnosis not present

## 2015-02-26 ENCOUNTER — Encounter: Payer: Self-pay | Admitting: Cardiovascular Disease

## 2015-03-09 DIAGNOSIS — I10 Essential (primary) hypertension: Secondary | ICD-10-CM | POA: Diagnosis not present

## 2015-03-09 DIAGNOSIS — I739 Peripheral vascular disease, unspecified: Secondary | ICD-10-CM | POA: Diagnosis not present

## 2015-03-31 ENCOUNTER — Telehealth: Payer: Self-pay | Admitting: Cardiology

## 2015-03-31 ENCOUNTER — Ambulatory Visit (INDEPENDENT_AMBULATORY_CARE_PROVIDER_SITE_OTHER): Payer: Medicare Other | Admitting: *Deleted

## 2015-03-31 DIAGNOSIS — I495 Sick sinus syndrome: Secondary | ICD-10-CM

## 2015-03-31 NOTE — Telephone Encounter (Signed)
Attempted to confirm remote transmission with pt. No answer and was unable to leave a message.   

## 2015-03-31 NOTE — Progress Notes (Signed)
Remote pacemaker transmission.   

## 2015-04-08 DIAGNOSIS — I739 Peripheral vascular disease, unspecified: Secondary | ICD-10-CM | POA: Diagnosis not present

## 2015-04-08 DIAGNOSIS — Z1389 Encounter for screening for other disorder: Secondary | ICD-10-CM | POA: Diagnosis not present

## 2015-04-09 LAB — CUP PACEART REMOTE DEVICE CHECK
Battery Remaining Longevity: 52 mo
Battery Remaining Percentage: 46 %
Battery Voltage: 2.86 V
Brady Statistic AP VP Percent: 64 %
Brady Statistic AP VS Percent: 22 %
Brady Statistic AS VP Percent: 4.5 %
Brady Statistic AS VS Percent: 9.1 %
Brady Statistic RA Percent Paced: 85 %
Brady Statistic RV Percent Paced: 69 %
Date Time Interrogation Session: 20170215153703
Implantable Lead Implant Date: 20030714
Implantable Lead Implant Date: 20030714
Implantable Lead Location: 753859
Implantable Lead Location: 753860
Lead Channel Impedance Value: 350 Ohm
Lead Channel Impedance Value: 450 Ohm
Lead Channel Pacing Threshold Amplitude: 0.5 V
Lead Channel Pacing Threshold Amplitude: 0.875 V
Lead Channel Pacing Threshold Pulse Width: 0.5 ms
Lead Channel Pacing Threshold Pulse Width: 0.5 ms
Lead Channel Sensing Intrinsic Amplitude: 1.2 mV
Lead Channel Sensing Intrinsic Amplitude: 1.8 mV
Lead Channel Setting Pacing Amplitude: 1.125
Lead Channel Setting Pacing Amplitude: 1.5 V
Lead Channel Setting Pacing Pulse Width: 0.5 ms
Lead Channel Setting Sensing Sensitivity: 1 mV
Pulse Gen Model: 2110
Pulse Gen Serial Number: 2298006

## 2015-04-13 ENCOUNTER — Telehealth: Payer: Self-pay | Admitting: Cardiovascular Disease

## 2015-04-13 NOTE — Telephone Encounter (Signed)
Pt says her primary doctor have suggested her taking Plavix for the blockage in her leg. What does Dr C think about this?

## 2015-04-13 NOTE — Telephone Encounter (Signed)
Plavix would have a slight edge over aspirin in statistically preventing serious acute events, but will also be associated with a slightly higher risk of bleeding.  One important point to note is the fact that it was not have any impact on symptoms.  It would also not prevent worsening of the blockage. All in all, I'm not sure that switching to clopidogrel would make a big difference, especially at age 82.

## 2015-04-13 NOTE — Telephone Encounter (Signed)
Advice communicated. Pt voiced understanding.  Scheduled for Dr. Loletha Grayer for a May appt at pt request.

## 2015-04-13 NOTE — Telephone Encounter (Signed)
Returned call to patient. This is in regards to PV issues eval'd by Dr. Gwenlyn Found last fall. No intervention recommended at that time. Routed to Dr. Sallyanne Kuster for further guidance.

## 2015-04-14 ENCOUNTER — Encounter: Payer: Self-pay | Admitting: Cardiology

## 2015-04-29 DIAGNOSIS — I739 Peripheral vascular disease, unspecified: Secondary | ICD-10-CM | POA: Diagnosis not present

## 2015-04-29 DIAGNOSIS — E0590 Thyrotoxicosis, unspecified without thyrotoxic crisis or storm: Secondary | ICD-10-CM | POA: Diagnosis not present

## 2015-05-03 DIAGNOSIS — E0590 Thyrotoxicosis, unspecified without thyrotoxic crisis or storm: Secondary | ICD-10-CM | POA: Diagnosis not present

## 2015-06-16 ENCOUNTER — Ambulatory Visit: Payer: BLUE CROSS/BLUE SHIELD | Admitting: Cardiovascular Disease

## 2015-06-18 ENCOUNTER — Encounter: Payer: Self-pay | Admitting: Cardiovascular Disease

## 2015-06-18 ENCOUNTER — Ambulatory Visit (INDEPENDENT_AMBULATORY_CARE_PROVIDER_SITE_OTHER): Payer: Medicare Other | Admitting: Cardiovascular Disease

## 2015-06-18 VITALS — BP 146/60 | HR 72 | Ht 63.0 in | Wt 109.2 lb

## 2015-06-18 DIAGNOSIS — I739 Peripheral vascular disease, unspecified: Secondary | ICD-10-CM | POA: Diagnosis not present

## 2015-06-18 DIAGNOSIS — I441 Atrioventricular block, second degree: Secondary | ICD-10-CM | POA: Diagnosis not present

## 2015-06-18 DIAGNOSIS — I495 Sick sinus syndrome: Secondary | ICD-10-CM | POA: Diagnosis not present

## 2015-06-18 DIAGNOSIS — Z950 Presence of cardiac pacemaker: Secondary | ICD-10-CM | POA: Diagnosis not present

## 2015-06-18 DIAGNOSIS — Z8679 Personal history of other diseases of the circulatory system: Secondary | ICD-10-CM

## 2015-06-18 DIAGNOSIS — I2510 Atherosclerotic heart disease of native coronary artery without angina pectoris: Secondary | ICD-10-CM

## 2015-06-18 DIAGNOSIS — Z9889 Other specified postprocedural states: Secondary | ICD-10-CM

## 2015-06-18 MED ORDER — CILOSTAZOL 50 MG PO TABS: 50.0000 mg | ORAL_TABLET | Freq: Two times a day (BID) | ORAL | Status: DC

## 2015-06-18 NOTE — Patient Instructions (Addendum)
Medication Instructions:  START Cliostazol 50mg  take 1 tablet by mouth Once a Day   Labwork: NONE  Testing/Procedures: Your physician has requested that you have an echocardiogram. Echocardiography is a painless test that uses sound waves to create images of your heart. It provides your doctor with information about the size and shape of your heart and how well your heart's chambers and valves are working. This procedure takes approximately one hour. There are no restrictions for this procedure.   Follow-Up: 6 Months with Dr Sallyanne Kuster  Jun 28, 2015 You have an appointment for a Remote Download  Any Other Special Instructions Will Be Listed Below (If Applicable).     If you need a refill on your cardiac medications before your next appointment, please call your pharmacy.

## 2015-06-18 NOTE — Progress Notes (Signed)
Patient ID: Holly Bolton, female   DOB: 1922-03-15, 82 y.o.   MRN: [id number]    Cardiology Office Note    Date:  06/18/2015   ID:  Holly Bolton, DOB Sep 08, 1922, MRN [id number]  PCP:  Jani Gravel, MD  Cardiologist:   Sanda Klein, MD   Chief Complaint  Patient presents with  . Follow-up    no chest pain, occassional shortness of breath,has little edema in ankles, has leg tightness in legs, no lightheaded or dizziness    History of Present Illness:  Holly Bolton is a 82 y.o. female for follow up for PAD, CAD status post CABG, s/p AVR & ascending aortic aneurysm repair, pacemaker for SSS and 2nd deg AV block.  She denies angina or dyspnea on exertion but has increasingly frequent problems with leg cramps. She does seem to describe some degree of intermittent claudication, but some of her symptoms occur at rest (after sitting in church for a long time) and there seems to be a component of neurogenic claudication. She does not have limb pain when she lies in bed, but often has generalized muscle cramps when she wakes up in the morning. She has not seen any sores or ulcers on her feet. She has seen Dr. Gwenlyn Found to discuss revascularization, but at that point it was felt that the risk to her renal function outweighed potential benefit. Her creatinine clearance is around 30 mL/minute. Over the last year, she's had a decline in her left ABI from 0.94 to 0.78 although her left iliac velocities are not significantly elevated.  She did not have good blood pressure control on losartan and is back on Benicar. Her typical blood pressure at home is in the 130s/60s.  She denies palpitations, dizziness, syncope. He has not had any focal neurological events and denies bleeding problems.   Pacemaker interrogation in February shows normal device function with estimated generator longevity of another 4 years or so (dual-chamber St. Jude Accent DR non-RF device). She is not pacemaker dependent. She has 85 % atrial  pacing and 69% ventricular pacing. She has had rare and very brief episodes of atrial mode switch (longest one and a half minutes, most related to competitive sensor pacing).  She underwent aortic root replacement with a Medtronic Freestyle root, with coronary reimplantation and a single vessel LIMA to LAD bypass in 1999 (for bicuspid valve with severe stenosis and ascending aortic aneurysm). She received a dual-chamber permanent pacemaker for sinus node dysfunction and intermittent high-grade AV block. She had a pacemaker generator change in 2010 ([address] Jude accent). She has hyperlipidemia but prefers not to take statin medication.    Past Medical History  Diagnosis Date  . Hypertension   . Asthma   . Aneurysm (arteriovenous) of coronary vessels     ascending aorta requiring bypass of the LAD  . Presence of permanent cardiac pacemaker 08/26/01    Sinus node dysfunction-St.Jude  . Pacemaker generator end of life 11/18/08    Intermittent high-grade atrioventricular block  . Hyperlipemia   . Carotid stenosis 04/28/08    Doppler: <40% stenosis bilateral  . Status post ascending aortic aneurysm repair/AVR -  Medtronic Freestyle root 09/28/2014    ascending aorta requiring bypass of the LAD   . Peripheral arterial disease (HCC)     left ABI of 0.78    Past Surgical History  Procedure Laterality Date  . Cardiac surgery    . Pacemaker insertion  08/26/01    [employer]Jude  . Pacemaker generator change  11/18/08    [employer]Jude  . Ascending aortic aneurysm repair  09/22/97    porcine aortic root  . Coronary artery bypass graft  09/22/97    LIMA to the LAD  . Abdominal hysterectomy  1966  . Kidney stone surgery  1959    left  . Nephrectomy  1958    right  . Ovarian cyst surgery  1948  . Appendectomy  1943  . Breast fibroadenoma surgery  11/89    Current Medications: Outpatient Prescriptions Prior to Visit  Medication Sig Dispense Refill  . ALPHAGAN P 0.1 % SOLN Place 1 application into both eyes 2  (two) times daily.  1  . aspirin 81 MG tablet Take 81 mg by mouth daily.    . CO ENZYME Q-10 PO Take 1 capsule by mouth daily.    . furosemide (LASIX) 20 MG tablet Take 1 tablet (20 mg total) by mouth 2 (two) times a week. 30 tablet 11  . vitamin B-12 (CYANOCOBALAMIN) 1000 MCG tablet Take 1,000 mcg by mouth daily.    . cholecalciferol (VITAMIN D) 1000 UNITS tablet Take 2,000 Units by mouth 2 (two) times daily. Reported on 06/18/2015    . losartan (COZAAR) 50 MG tablet Take 50 mg by mouth daily. Reported on 06/18/2015  3  . Magnesium 250 MG TABS Take 250 mg by mouth daily. Reported on 06/18/2015    . metoprolol tartrate (LOPRESSOR) 25 MG tablet Take 25 mg by mouth daily. Reported on 06/18/2015  3   No facility-administered medications prior to visit.     Allergies:   Accupril; Amlodipine; Benadryl; Biaxin; Ciprofloxacin; Codeine; Diovan; Medrol; Morphine and related; Neurontin; and Penicillins   Social History   Social History  . Marital Status: Widowed    Spouse Name: N/A  . Number of Children: N/A  . Years of Education: N/A   Social History Main Topics  . Smoking status: Never Smoker   . Smokeless tobacco: None  . Alcohol Use: No  . Drug Use: None  . Sexual Activity: Not Asked   Other Topics Concern  . None   Social History Narrative     Family History:  The patient's family history includes Heart attack in her brother, father, and mother; Hyperlipidemia in her sister; Hypertension in her sister.   ROS:   Please see the history of present illness.    ROS All other systems reviewed and are negative.   PHYSICAL EXAM:   VS:  BP 146/60 mmHg  Pulse 72  Ht 5\' 3"  (1.6 m)  Wt 49.555 kg (109 lb 4 oz)  BMI 19.36 kg/m2   GEN: Well nourished, well developed, in no acute distress HEENT: normal Neck: no JVD, carotid bruits, or masses Cardiac: Paradoxically split second heart sound, RRR; no murmurs, rubs, or gallops,no edema  Respiratory:  clear to auscultation bilaterally, normal work  of breathing GI: soft, nontender, nondistended, + BS MS: no deformity or atrophy Skin: warm and dry, no rash Neuro:  Alert and Oriented x 3, Strength and sensation are intact Psych: euthymic mood, full affect  Wt Readings from Last 3 Encounters:  06/18/15 49.555 kg (109 lb 4 oz)  12/08/14 48.535 kg (107 lb)  09/28/14 49.896 kg (110 lb)      Studies/Labs Reviewed:   EKG:  EKG is ordered today.  The ekg ordered today demonstrates Atrioventricular sequential pacing  ASSESSMENT:    1. Peripheral arterial disease ([place])   2. Second degree AV block   3. SSS (sick sinus  syndrome) ([place])   4. Pacemaker, dual chamber St. Jude 2010   5. Coronary artery disease involving native coronary artery of native heart without angina pectoris   6. Status post ascending aortic aneurysm repair/AVR -  Medtronic Freestyle root   7. Hyperlipidemia      PLAN:  In order of problems listed above:  1. PAD: Will try a period of medical therapy with cilostazol. Discussed the need for walking to the limit of claudication to promote the formation of collateral circulation. Depending on symptom severity or any evidence of limb threat, may revisit the options for angiography and percutaneous revascularization. 2. 2nd deg AV block: Fairly high percentage of ventricular pacing, without evidence of congestive heart failure 3. SSS: Heart rate histogram distribution shows adequate sensor settings for her level of physical activity 4. PPM: Normal device function by recent check. There was some question of a need for software update to her transmitter. She is due her next transmission in a couple of weeks and will make sure that we are receiving it correctly. Her device is not RF enabled and does not do automatic downloads, but can perform manual downloads with a wand. 5. CAD: I don't think she had much in the way of coronary disease. She primarily had coronary reimplantation at the time of her Bentall procedure. She does  not want to take statins 6. S/P Asc Ao repair and bioAVR (Medtronic Freestyle): She was getting routine echo follow-up at [facility] but has not been there since 2014. We'll repeat an echo before her next appointment.    Medication Adjustments/Labs and Tests Ordered: Current medicines are reviewed at length with the patient today.  Concerns regarding medicines are outlined above.  Medication changes, Labs and Tests ordered today are listed in the Patient Instructions below. Patient Instructions  Medication Instructions:  START Cliostazol 50mg  take 1 tablet by mouth Once a Day   Labwork: NONE  Testing/Procedures: Your physician has requested that you have an echocardiogram. Echocardiography is a painless test that uses sound waves to create images of your heart. It provides your doctor with information about the size and shape of your heart and how well your heart's chambers and valves are working. This procedure takes approximately one hour. There are no restrictions for this procedure.   Follow-Up: 6 Months with Dr Sallyanne Kuster  Jun 28, 2015 You have an appointment for a Remote Download  Any Other Special Instructions Will Be Listed Below (If Applicable).     If you need a refill on your cardiac medications before your next appointment, please call your pharmacy.      Mikael Spray, MD  06/18/2015 6:41 PM    [facility] [place], [place], [place]  [address] Phone: [phone]; Fax: [phone]

## 2015-06-28 DIAGNOSIS — H401131 Primary open-angle glaucoma, bilateral, mild stage: Secondary | ICD-10-CM | POA: Diagnosis not present

## 2015-06-28 DIAGNOSIS — H353131 Nonexudative age-related macular degeneration, bilateral, early dry stage: Secondary | ICD-10-CM | POA: Diagnosis not present

## 2015-06-28 DIAGNOSIS — B0239 Other herpes zoster eye disease: Secondary | ICD-10-CM | POA: Diagnosis not present

## 2015-06-30 DIAGNOSIS — B0239 Other herpes zoster eye disease: Secondary | ICD-10-CM | POA: Diagnosis not present

## 2015-06-30 DIAGNOSIS — H5711 Ocular pain, right eye: Secondary | ICD-10-CM | POA: Diagnosis not present

## 2015-06-30 DIAGNOSIS — H401131 Primary open-angle glaucoma, bilateral, mild stage: Secondary | ICD-10-CM | POA: Diagnosis not present

## 2015-07-01 ENCOUNTER — Inpatient Hospital Stay (HOSPITAL_COMMUNITY): Admission: RE | Admit: 2015-07-01 | Payer: BLUE CROSS/BLUE SHIELD | Source: Ambulatory Visit

## 2015-07-05 DIAGNOSIS — H5711 Ocular pain, right eye: Secondary | ICD-10-CM | POA: Diagnosis not present

## 2015-07-05 DIAGNOSIS — B0239 Other herpes zoster eye disease: Secondary | ICD-10-CM | POA: Diagnosis not present

## 2015-07-05 DIAGNOSIS — H401131 Primary open-angle glaucoma, bilateral, mild stage: Secondary | ICD-10-CM | POA: Diagnosis not present

## 2015-07-13 ENCOUNTER — Ambulatory Visit (HOSPITAL_COMMUNITY)
Admission: RE | Admit: 2015-07-13 | Discharge: 2015-07-13 | Disposition: A | Discharge status: Home / Self Care | Payer: Medicare Other | Source: Ambulatory Visit | Attending: Cardiology | Admitting: Cardiology

## 2015-07-13 DIAGNOSIS — I119 Hypertensive heart disease without heart failure: Secondary | ICD-10-CM | POA: Insufficient documentation

## 2015-07-13 DIAGNOSIS — I071 Rheumatic tricuspid insufficiency: Secondary | ICD-10-CM | POA: Insufficient documentation

## 2015-07-13 DIAGNOSIS — Z9889 Other specified postprocedural states: Secondary | ICD-10-CM | POA: Diagnosis not present

## 2015-07-13 DIAGNOSIS — E785 Hyperlipidemia, unspecified: Secondary | ICD-10-CM | POA: Diagnosis not present

## 2015-07-13 DIAGNOSIS — Z8679 Personal history of other diseases of the circulatory system: Secondary | ICD-10-CM | POA: Diagnosis not present

## 2015-07-13 DIAGNOSIS — I351 Nonrheumatic aortic (valve) insufficiency: Secondary | ICD-10-CM | POA: Diagnosis not present

## 2015-07-13 DIAGNOSIS — I2510 Atherosclerotic heart disease of native coronary artery without angina pectoris: Secondary | ICD-10-CM | POA: Diagnosis not present

## 2015-07-13 DIAGNOSIS — I359 Nonrheumatic aortic valve disorder, unspecified: Secondary | ICD-10-CM | POA: Diagnosis present

## 2015-07-16 DIAGNOSIS — B0239 Other herpes zoster eye disease: Secondary | ICD-10-CM | POA: Diagnosis not present

## 2015-07-16 DIAGNOSIS — H5711 Ocular pain, right eye: Secondary | ICD-10-CM | POA: Diagnosis not present

## 2015-07-16 DIAGNOSIS — H401131 Primary open-angle glaucoma, bilateral, mild stage: Secondary | ICD-10-CM | POA: Diagnosis not present

## 2015-07-29 DIAGNOSIS — H524 Presbyopia: Secondary | ICD-10-CM | POA: Diagnosis not present

## 2015-07-29 DIAGNOSIS — H401131 Primary open-angle glaucoma, bilateral, mild stage: Secondary | ICD-10-CM | POA: Diagnosis not present

## 2015-08-03 DIAGNOSIS — I10 Essential (primary) hypertension: Secondary | ICD-10-CM | POA: Diagnosis not present

## 2015-08-03 DIAGNOSIS — E0590 Thyrotoxicosis, unspecified without thyrotoxic crisis or storm: Secondary | ICD-10-CM | POA: Diagnosis not present

## 2015-08-03 DIAGNOSIS — I739 Peripheral vascular disease, unspecified: Secondary | ICD-10-CM | POA: Diagnosis not present

## 2015-08-12 DIAGNOSIS — E0590 Thyrotoxicosis, unspecified without thyrotoxic crisis or storm: Secondary | ICD-10-CM | POA: Diagnosis not present

## 2015-08-12 DIAGNOSIS — D649 Anemia, unspecified: Secondary | ICD-10-CM | POA: Diagnosis not present

## 2015-08-12 DIAGNOSIS — I10 Essential (primary) hypertension: Secondary | ICD-10-CM | POA: Diagnosis not present

## 2015-08-12 DIAGNOSIS — M810 Age-related osteoporosis without current pathological fracture: Secondary | ICD-10-CM | POA: Diagnosis not present

## 2015-09-15 ENCOUNTER — Other Ambulatory Visit: Payer: Self-pay | Admitting: Cardiovascular Disease

## 2015-09-15 NOTE — Telephone Encounter (Signed)
Rx request sent to pharmacy.  

## 2015-10-05 DIAGNOSIS — E0590 Thyrotoxicosis, unspecified without thyrotoxic crisis or storm: Secondary | ICD-10-CM | POA: Diagnosis not present

## 2015-11-03 DIAGNOSIS — E0590 Thyrotoxicosis, unspecified without thyrotoxic crisis or storm: Secondary | ICD-10-CM | POA: Diagnosis not present

## 2015-11-09 DIAGNOSIS — Z23 Encounter for immunization: Secondary | ICD-10-CM | POA: Diagnosis not present

## 2015-11-09 DIAGNOSIS — E0590 Thyrotoxicosis, unspecified without thyrotoxic crisis or storm: Secondary | ICD-10-CM | POA: Diagnosis not present

## 2015-12-02 DIAGNOSIS — H401131 Primary open-angle glaucoma, bilateral, mild stage: Secondary | ICD-10-CM | POA: Diagnosis not present

## 2015-12-15 ENCOUNTER — Encounter: Payer: Self-pay | Admitting: Cardiovascular Disease

## 2015-12-15 ENCOUNTER — Ambulatory Visit (INDEPENDENT_AMBULATORY_CARE_PROVIDER_SITE_OTHER): Payer: Medicare Other | Admitting: Cardiovascular Disease

## 2015-12-15 VITALS — BP 158/62 | HR 93 | Ht 63.0 in | Wt 105.0 lb

## 2015-12-15 DIAGNOSIS — I10 Essential (primary) hypertension: Secondary | ICD-10-CM | POA: Diagnosis not present

## 2015-12-15 DIAGNOSIS — I495 Sick sinus syndrome: Secondary | ICD-10-CM

## 2015-12-15 DIAGNOSIS — I739 Peripheral vascular disease, unspecified: Secondary | ICD-10-CM | POA: Diagnosis not present

## 2015-12-15 DIAGNOSIS — Z8679 Personal history of other diseases of the circulatory system: Secondary | ICD-10-CM

## 2015-12-15 DIAGNOSIS — Z9889 Other specified postprocedural states: Secondary | ICD-10-CM

## 2015-12-15 DIAGNOSIS — I2510 Atherosclerotic heart disease of native coronary artery without angina pectoris: Secondary | ICD-10-CM

## 2015-12-15 DIAGNOSIS — I441 Atrioventricular block, second degree: Secondary | ICD-10-CM | POA: Diagnosis not present

## 2015-12-15 DIAGNOSIS — Z950 Presence of cardiac pacemaker: Secondary | ICD-10-CM | POA: Diagnosis not present

## 2015-12-15 MED ORDER — CARVEDILOL 25 MG PO TABS: 25.0000 mg | ORAL_TABLET | Freq: Two times a day (BID) | ORAL | 11 refills | Status: DC

## 2015-12-15 MED ORDER — MAGNESIUM 250 MG PO TABS: 1.0000 | ORAL_TABLET | Freq: Two times a day (BID) | ORAL | 11 refills | Status: DC

## 2015-12-15 NOTE — Progress Notes (Signed)
Patient ID: Holly Bolton, female   DOB: [birth year], 82 y.o.   MRN: [id number]    Cardiology Office Note    Date:  12/15/2015   ID:  Holly Bolton, DOB 1922/07/02, MRN [id number]  PCP:  Jani Gravel, MD  Cardiologist:   Sanda Klein, MD   Chief Complaint  Patient presents with  . Follow-up  . Edema    In her legs as well as cramping.  . Dizziness  . Shortness of Breath    History of Present Illness:  Holly Bolton is a 82 y.o. female for follow up for PAD, CAD status post CABG, s/p AVR & ascending aortic aneurysm repair, pacemaker for SSS and 2nd deg AV block.  She still complains of frequent problems with leg cramps. She does seem to describe some degree of intermittent claudication, but some of her symptoms occur at rest (after sitting in church for a long time) and there seems to be a component of neurogenic claudication. She has not seen any sores or ulcers on her feet. She has seen Dr. Gwenlyn Found to discuss revascularization, but at that point it was felt that the risk to her renal function outweighed potential benefit. Her creatinine clearance is around 30 mL/minute. Over the last year, she's had a decline in her left ABI from 0.94 to 0.78 although her left iliac velocities are not significantly elevated.  I offered treatment with cilostazol, but she declined due to the concern of worsening heart failure.  Her blood pressure has been mostly elevated, typically in the 150s/60-70s. Very rarely her systolic blood pressure will be down in the high 130s  She denies palpitations, dizziness, syncope. He has not had any focal neurological events and denies bleeding problems.   Pacemaker interrogation in February shows normal device function with estimated generator longevity of another 2.3 years or so (dual-chamber St. Jude Accent DR non-RF device). She is not pacemaker dependent. She has 81 % atrial pacing and 81% ventricular pacing. She has had rare and very brief episodes of atrial mode switch  (longest one and a half minutes, most related to competitive sensor pacing). Since July, the longest episode has been well under a minute in duration.  Ventricular sensing has slowly deteriorated in her R waves are right at 2.0 mV. Sensitivity was taken down to 0.5 mV today. Otherwise lead impedance and pacing threshold remain excellent.  She underwent aortic root replacement with a Medtronic Freestyle root, with coronary reimplantation and a single vessel LIMA to LAD bypass in 1999 (for bicuspid valve with severe stenosis and ascending aortic aneurysm). She received a dual-chamber permanent pacemaker for sinus node dysfunction and intermittent high-grade AV block. She had a pacemaker generator change in 2010 ([address] Jude accent). She has hyperlipidemia but prefers not to take statin medication.    Past Medical History:  Diagnosis Date  . Aneurysm (arteriovenous) of coronary vessels    ascending aorta requiring bypass of the LAD  . Asthma   . Carotid stenosis 04/28/08   Doppler: <40% stenosis bilateral  . Hyperlipemia   . Hypertension   . Pacemaker generator end of life 11/18/08   Intermittent high-grade atrioventricular block  . Peripheral arterial disease (HCC)    left ABI of 0.78  . Presence of permanent cardiac pacemaker 08/26/01   Sinus node dysfunction-St.Jude  . Status post ascending aortic aneurysm repair/AVR -  Medtronic Freestyle root 09/28/2014   ascending aorta requiring bypass of the LAD     Past Surgical History:  Procedure  Laterality Date  . ABDOMINAL HYSTERECTOMY  1966  . APPENDECTOMY  1943  . ASCENDING AORTIC ANEURYSM REPAIR  09/22/97   porcine aortic root  . BREAST FIBROADENOMA SURGERY  11/89  . CARDIAC SURGERY    . CORONARY ARTERY BYPASS GRAFT  09/22/97   LIMA to the LAD  . [place]   left  . NEPHRECTOMY  1958   right  . OVARIAN CYST SURGERY  1948  . PACEMAKER GENERATOR CHANGE  11/18/08   St.Jude  . PACEMAKER INSERTION  08/26/01   St.Jude     Current Medications: Outpatient Medications Prior to Visit  Medication Sig Dispense Refill  . ALPHAGAN P 0.1 % SOLN Place 1 application into both eyes 2 (two) times daily.  1  . aspirin 81 MG tablet Take 81 mg by mouth daily.    Marland Kitchen BENICAR 40 MG tablet Take 1 tablet by mouth daily.  4  . Cholecalciferol (VITAMIN D) 2000 units CAPS Take 1 capsule by mouth daily.    . cilostazol (PLETAL) 50 MG tablet Take 1 tablet (50 mg total) by mouth every 12 (twelve) hours. 30 tablet 11  . CO ENZYME Q-10 PO Take 1 capsule by mouth daily.    . furosemide (LASIX) 20 MG tablet Take 1 tablet (20 mg total) by mouth 2 (two) times a week. 30 tablet 11  . vitamin B-12 (CYANOCOBALAMIN) 1000 MCG tablet Take 1,000 mcg by mouth daily.    . Magnesium 250 MG TABS Take 1 tablet by mouth daily.    . furosemide (LASIX) 20 MG tablet TAKE 1 TABLET(20 MG) BY MOUTH DAILY 30 tablet 6   No facility-administered medications prior to visit.      Allergies:   Accupril [quinapril hcl]; Amlodipine; Benadryl [diphenhydramine hcl]; Biaxin [clarithromycin]; Ciprofloxacin; Codeine; Diovan [valsartan]; Medrol [methylprednisolone]; Morphine and related; Neurontin [gabapentin]; and Penicillins   Social History   Social History  . Marital status: Widowed    Spouse name: N/A  . Number of children: N/A  . Years of education: N/A   Social History Main Topics  . Smoking status: Never Smoker  . Smokeless tobacco: Never Used  . Alcohol use No  . Drug use: Unknown  . Sexual activity: Not Asked   Other Topics Concern  . None   Social History Narrative  . None     Family History:  The patient's family history includes Heart attack in her brother, father, and mother; Hyperlipidemia in her sister; Hypertension in her sister.   ROS:   Please see the history of present illness.    ROS All other systems reviewed and are negative.   PHYSICAL EXAM:   VS:  BP (!) 158/62   Pulse 93   Ht 5\' 3"  (1.6 m)   Wt 105 lb (47.6 kg)    BMI 18.60 kg/m    GEN: Well nourished, well developed, in no acute distress  HEENT: normal  Neck: no JVD, carotid bruits, or masses Cardiac: Paradoxically split second heart sound, RRR; no murmurs, rubs, or gallops,no edema  Respiratory:  clear to auscultation bilaterally, normal work of breathing GI: soft, nontender, nondistended, + BS MS: no deformity or atrophy  Skin: warm and dry, no rash Neuro:  Alert and Oriented x 3, Strength and sensation are intact Psych: euthymic mood, full affect  Wt Readings from Last 3 Encounters:  12/15/15 105 lb (47.6 kg)  06/18/15 109 lb 4 oz (49.6 kg)  12/08/14 107 lb (48.5 kg)  Studies/Labs Reviewed:   EKG:  EKG is ordered today.  The ekg ordered today demonstrates Atrial sensed, ventricular paced rhythm. There is atrial bigeminy. During pacemaker interrogation the rhythm was mostly normal sinus with ventricular pacing.  ASSESSMENT:    1. Peripheral arterial disease ([place])   2. Second degree AV block   3. SSS (sick sinus syndrome) ([place])   4. Pacemaker, dual chamber St. Jude 2010   5. Essential hypertension   6. Coronary artery disease involving native coronary artery of native heart without angina pectoris   7. Status post ascending aortic aneurysm repair/AVR -  Medtronic Freestyle root      PLAN:  In order of problems listed above:  1. PAD: Her symptoms mostly occur at rest and I don't think they represent claudication for the most part.  Discussed the need for walking to the limit of claudication to promote the formation of collateral circulation. Depending on symptom severity or any evidence of limb threat, may revisit the options for angiography and percutaneous revascularization. Try to increase intake of water and increase magnesium to twice daily 2. 2nd deg AV block: high percentage of ventricular pacing, without evidence of congestive heart failure 3. SSS: Heart rate histogram distribution shows adequate sensor settings for her  level of physical activity 4. PPM: Normal device function by recent check. Adjusted ventricular lead sensitivity today. 5. HTN: Increase carvedilol to 25 mg twice a day. Target systolic blood pressure 510 or less  6. CAD: I don't think she had much in the way of coronary disease. She primarily had coronary reimplantation at the time of her Bentall procedure. She does not want to take statins 7. S/P Asc Ao repair and bioAVR (Medtronic Freestyle): She was getting routine echo follow-up at [facility] but has not been there since 2014. We'll repeat an echo before her next appointment.    Medication Adjustments/Labs and Tests Ordered: Current medicines are reviewed at length with the patient today.  Concerns regarding medicines are outlined above.  Medication changes, Labs and Tests ordered today are listed in the Patient Instructions below. Patient Instructions  Dr Sallyanne Kuster has recommended making the following medication changes: 1. INCREASE Carvedilol to 25 mg twice daily 2. INCREASE Magnesium to 1 tablet twice daily  Remote monitoring is used to monitor your Pacemaker of ICD from home. This monitoring reduces the number of office visits required to check your device to one time per year. It allows [place] to keep an eye on the functioning of your device to ensure it is working properly. You are scheduled for a device check from home on Thursday, January 31st, 2018. You may send your transmission at any time that day. If you have a wireless device, the transmission will be sent automatically. After your physician reviews your transmission, you will receive a postcard with your next transmission date.  Dr Sallyanne Kuster recommends that you schedule a follow-up appointment in 6 months with a pacemaker check. You will receive a reminder letter in the mail two months in advance. If you don't receive a letter, please call our office to schedule the follow-up appointment.  If you need a refill on your cardiac  medications before your next appointment, please call your pharmacy.    Signed, Sanda Klein, MD  12/15/2015 2:33 PM    [facility] [address], [place], [place]  [address] Phone: [phone]; Fax: [phone]

## 2015-12-15 NOTE — Patient Instructions (Signed)
Dr Sallyanne Kuster has recommended making the following medication changes: 1. INCREASE Carvedilol to 25 mg twice daily 2. INCREASE Magnesium to 1 tablet twice daily  Remote monitoring is used to monitor your Pacemaker of ICD from home. This monitoring reduces the number of office visits required to check your device to one time per year. It allows [place] to keep an eye on the functioning of your device to ensure it is working properly. You are scheduled for a device check from home on Thursday, January 31st, 2018. You may send your transmission at any time that day. If you have a wireless device, the transmission will be sent automatically. After your physician reviews your transmission, you will receive a postcard with your next transmission date.  Dr Sallyanne Kuster recommends that you schedule a follow-up appointment in 6 months with a pacemaker check. You will receive a reminder letter in the mail two months in advance. If you don't receive a letter, please call our office to schedule the follow-up appointment.  If you need a refill on your cardiac medications before your next appointment, please call your pharmacy.

## 2016-02-11 DIAGNOSIS — E538 Deficiency of other specified B group vitamins: Secondary | ICD-10-CM | POA: Diagnosis not present

## 2016-02-11 DIAGNOSIS — E0590 Thyrotoxicosis, unspecified without thyrotoxic crisis or storm: Secondary | ICD-10-CM | POA: Diagnosis not present

## 2016-02-11 DIAGNOSIS — Z79899 Other long term (current) drug therapy: Secondary | ICD-10-CM | POA: Diagnosis not present

## 2016-02-11 DIAGNOSIS — D649 Anemia, unspecified: Secondary | ICD-10-CM | POA: Diagnosis not present

## 2016-02-11 DIAGNOSIS — I10 Essential (primary) hypertension: Secondary | ICD-10-CM | POA: Diagnosis not present

## 2016-02-18 DIAGNOSIS — I739 Peripheral vascular disease, unspecified: Secondary | ICD-10-CM | POA: Diagnosis not present

## 2016-02-18 DIAGNOSIS — I10 Essential (primary) hypertension: Secondary | ICD-10-CM | POA: Diagnosis not present

## 2016-02-18 DIAGNOSIS — E7800 Pure hypercholesterolemia, unspecified: Secondary | ICD-10-CM | POA: Diagnosis not present

## 2016-03-04 LAB — CUP PACEART INCLINIC DEVICE CHECK
Date Time Interrogation Session: 20180120131825
Implantable Lead Implant Date: 20030714
Implantable Lead Implant Date: 20030714
Implantable Lead Location: 753859
Implantable Lead Location: 753860
Implantable Pulse Generator Implant Date: 20101006
Pulse Gen Model: 2110
Pulse Gen Serial Number: 2298006

## 2016-03-15 ENCOUNTER — Telehealth: Payer: Self-pay | Admitting: Cardiology

## 2016-03-15 ENCOUNTER — Ambulatory Visit (INDEPENDENT_AMBULATORY_CARE_PROVIDER_SITE_OTHER): Payer: Medicare Other | Admitting: *Deleted

## 2016-03-15 DIAGNOSIS — I495 Sick sinus syndrome: Secondary | ICD-10-CM

## 2016-03-15 NOTE — Progress Notes (Signed)
Remote pacemaker transmission.   

## 2016-03-15 NOTE — Telephone Encounter (Signed)
Spoke with pt and reminded pt of remote transmission that is due today. Pt verbalized understanding.   

## 2016-03-16 ENCOUNTER — Encounter: Payer: Self-pay | Admitting: Cardiology

## 2016-03-27 LAB — CUP PACEART REMOTE DEVICE CHECK
Battery Remaining Longevity: 22 mo
Battery Remaining Percentage: 19 %
Battery Voltage: 2.77 V
Brady Statistic AP VP Percent: 70 %
Brady Statistic AP VS Percent: 12 %
Brady Statistic AS VP Percent: 11 %
Brady Statistic AS VS Percent: 7.4 %
Brady Statistic RA Percent Paced: 80 %
Brady Statistic RV Percent Paced: 81 %
Date Time Interrogation Session: 20180131171109
Implantable Lead Implant Date: 20030714
Implantable Lead Implant Date: 20030714
Implantable Lead Location: 753859
Implantable Lead Location: 753860
Implantable Pulse Generator Implant Date: 20101006
Lead Channel Impedance Value: 340 Ohm
Lead Channel Impedance Value: 460 Ohm
Lead Channel Pacing Threshold Amplitude: 0.5 V
Lead Channel Pacing Threshold Amplitude: 0.625 V
Lead Channel Pacing Threshold Pulse Width: 0.5 ms
Lead Channel Pacing Threshold Pulse Width: 0.5 ms
Lead Channel Sensing Intrinsic Amplitude: 1.3 mV
Lead Channel Sensing Intrinsic Amplitude: 1.6 mV
Lead Channel Setting Pacing Amplitude: 0.875
Lead Channel Setting Pacing Amplitude: 1.5 V
Lead Channel Setting Pacing Pulse Width: 0.5 ms
Lead Channel Setting Sensing Sensitivity: 0.5 mV
Pulse Gen Model: 2110
Pulse Gen Serial Number: 2298006

## 2016-05-01 DIAGNOSIS — E0590 Thyrotoxicosis, unspecified without thyrotoxic crisis or storm: Secondary | ICD-10-CM | POA: Diagnosis not present

## 2016-05-08 ENCOUNTER — Other Ambulatory Visit (HOSPITAL_COMMUNITY): Payer: Self-pay | Admitting: Endocrinology

## 2016-05-08 DIAGNOSIS — E0590 Thyrotoxicosis, unspecified without thyrotoxic crisis or storm: Secondary | ICD-10-CM

## 2016-05-15 DIAGNOSIS — I10 Essential (primary) hypertension: Secondary | ICD-10-CM | POA: Diagnosis not present

## 2016-05-15 DIAGNOSIS — E039 Hypothyroidism, unspecified: Secondary | ICD-10-CM | POA: Diagnosis not present

## 2016-05-18 DIAGNOSIS — Z0000 Encounter for general adult medical examination without abnormal findings: Secondary | ICD-10-CM | POA: Diagnosis not present

## 2016-05-18 DIAGNOSIS — E0590 Thyrotoxicosis, unspecified without thyrotoxic crisis or storm: Secondary | ICD-10-CM | POA: Diagnosis not present

## 2016-05-18 DIAGNOSIS — I10 Essential (primary) hypertension: Secondary | ICD-10-CM | POA: Diagnosis not present

## 2016-05-18 DIAGNOSIS — D649 Anemia, unspecified: Secondary | ICD-10-CM | POA: Diagnosis not present

## 2016-05-18 DIAGNOSIS — M810 Age-related osteoporosis without current pathological fracture: Secondary | ICD-10-CM | POA: Diagnosis not present

## 2016-05-23 ENCOUNTER — Encounter (HOSPITAL_COMMUNITY): Payer: Medicare Other

## 2016-05-24 ENCOUNTER — Other Ambulatory Visit (HOSPITAL_COMMUNITY): Payer: Medicare Other

## 2016-05-31 DIAGNOSIS — R2681 Unsteadiness on feet: Secondary | ICD-10-CM | POA: Diagnosis not present

## 2016-05-31 DIAGNOSIS — D649 Anemia, unspecified: Secondary | ICD-10-CM | POA: Diagnosis not present

## 2016-05-31 DIAGNOSIS — Z79899 Other long term (current) drug therapy: Secondary | ICD-10-CM | POA: Diagnosis not present

## 2016-05-31 DIAGNOSIS — I10 Essential (primary) hypertension: Secondary | ICD-10-CM | POA: Diagnosis not present

## 2016-05-31 DIAGNOSIS — I739 Peripheral vascular disease, unspecified: Secondary | ICD-10-CM | POA: Diagnosis not present

## 2016-05-31 DIAGNOSIS — R040 Epistaxis: Secondary | ICD-10-CM | POA: Diagnosis not present

## 2016-06-01 DIAGNOSIS — H524 Presbyopia: Secondary | ICD-10-CM | POA: Diagnosis not present

## 2016-06-01 DIAGNOSIS — H401131 Primary open-angle glaucoma, bilateral, mild stage: Secondary | ICD-10-CM | POA: Diagnosis not present

## 2016-06-05 DIAGNOSIS — E0590 Thyrotoxicosis, unspecified without thyrotoxic crisis or storm: Secondary | ICD-10-CM | POA: Diagnosis not present

## 2016-06-05 DIAGNOSIS — D649 Anemia, unspecified: Secondary | ICD-10-CM | POA: Diagnosis not present

## 2016-06-05 DIAGNOSIS — I10 Essential (primary) hypertension: Secondary | ICD-10-CM | POA: Diagnosis not present

## 2016-06-05 DIAGNOSIS — I739 Peripheral vascular disease, unspecified: Secondary | ICD-10-CM | POA: Diagnosis not present

## 2016-06-12 ENCOUNTER — Encounter: Payer: Self-pay | Admitting: Neurology

## 2016-06-12 ENCOUNTER — Ambulatory Visit (INDEPENDENT_AMBULATORY_CARE_PROVIDER_SITE_OTHER): Payer: Medicare Other | Admitting: Neurology

## 2016-06-12 VITALS — BP 161/67 | HR 75 | Ht 63.0 in | Wt 107.5 lb

## 2016-06-12 DIAGNOSIS — R252 Cramp and spasm: Secondary | ICD-10-CM | POA: Diagnosis not present

## 2016-06-12 NOTE — Patient Instructions (Signed)
   Call me in 2 weeks if the leg cramps continue, we will try another medication for the cramps.

## 2016-06-12 NOTE — Progress Notes (Signed)
Reason for visit: Left leg pain  Referring physician: Dr. Ennis Forts is a 83 y.o. female  History of present illness:  Ms. Momon is a 83 year old right-handed white female with a history of left leg greater than right leg muscle cramps that will occur early in the morning. She has had problems with this issue for 15 years, but over the last year that frequency and severity of the cramps has worsened. The patient does have chronic renal insufficiency, she had been on a diuretic twice a week up until last week when this was discontinued. The patient indicates that she will get to sleep at night, when she wakes up in the morning she may note onset of a cramp in the left calf muscle. The pain will last for about 10 minutes and then go away. The patient will have some achy sensations in the calf throughout the day following the cramps. The patient will occasionally have some cramps on the right leg as well. The patient does have peripheral vascular disease that does affect the left leg. She denies any back pain or pain down the leg. She denies any cramping in the arms. She has not had any falls, she uses a cane for ambulation. She reports no numbness of the extremities. She feels "weak all over. She is sent to this office for further evaluation. She does take magnesium supplementation.  Past Medical History:  Diagnosis Date  . Aneurysm (arteriovenous) of coronary vessels    ascending aorta requiring bypass of the LAD  . Asthma   . Carotid stenosis 04/28/08   Doppler: <40% stenosis bilateral  . Hyperlipemia   . Hypertension   . Pacemaker generator end of life 11/18/08   Intermittent high-grade atrioventricular block  . Peripheral arterial disease (HCC)    left ABI of 0.78  . Presence of permanent cardiac pacemaker 08/26/01   Sinus node dysfunction-[employer]  . Status post ascending aortic aneurysm repair/AVR -  Medtronic Freestyle root 09/28/2014   ascending aorta requiring bypass of the  LAD     Past Surgical History:  Procedure Laterality Date  . ABDOMINAL HYSTERECTOMY  1966  . APPENDECTOMY  1943  . ASCENDING AORTIC ANEURYSM REPAIR  09/22/97   porcine aortic root  . BREAST FIBROADENOMA SURGERY  11/89  . CARDIAC SURGERY    . CORONARY ARTERY BYPASS GRAFT  09/22/97   LIMA to the LAD  . [place]   left  . NEPHRECTOMY  1958   right  . OVARIAN CYST SURGERY  1948  . PACEMAKER GENERATOR CHANGE  11/18/08   [employer]  . PACEMAKER INSERTION  08/26/01   [employer]    Family History  Problem Relation Age of Onset  . Heart attack Mother   . Heart attack Father   . Heart attack Brother   . Hyperlipidemia Sister   . Hypertension Sister     Social history:  reports that she has never smoked. She has never used smokeless tobacco. She reports that she does not drink alcohol or use drugs.  Medications:  Prior to Admission medications   Medication Sig Start Date End Date Taking? Authorizing Provider  ALPHAGAN P 0.1 % SOLN Place 1 application into both eyes 2 (two) times daily. 09/20/14  Yes Historical Provider, MD  aspirin 81 MG tablet Take 81 mg by mouth daily.   Yes Historical Provider, MD  brimonidine (ALPHAGAN) 0.2 % ophthalmic solution instill ONE DROP IN [facility] EYE TWICE DAILY 03/31/16  Yes Historical Provider, MD  carvedilol (COREG) 3.125 MG tablet Take 3.125 mg by mouth 2 (two) times daily with a meal. 06/05/16  Yes Historical Provider, MD  Cholecalciferol (VITAMIN D) 2000 units CAPS Take 1 capsule by mouth daily.   Yes Historical Provider, MD  CO ENZYME Q-10 PO Take 1 capsule by mouth daily.   Yes Historical Provider, MD  Magnesium 250 MG TABS Take 1 tablet (250 mg total) by mouth 2 (two) times daily. 12/15/15  Yes Mihai Croitoru, MD  olmesartan (BENICAR) 20 MG tablet Take 20 mg by mouth 2 (two) times daily.   Yes Historical Provider, MD  vitamin B-12 (CYANOCOBALAMIN) 1000 MCG tablet Take 1,000 mcg by mouth daily.   Yes Historical Provider, MD      Allergies    Allergen Reactions  . Accupril [Quinapril Hcl]   . Amlodipine Swelling  . Benadryl [Diphenhydramine Hcl]   . Biaxin [Clarithromycin]   . Ciprofloxacin   . Codeine   . Diovan [Valsartan]   . Medrol [Methylprednisolone]   . Morphine And Related   . Neurontin [Gabapentin]   . Penicillins     ROS:  Out of a complete 14 system review of symptoms, the patient complains only of the following symptoms, and all other reviewed systems are negative.  Fatigue Easy bruising Achy muscles Mild memory loss Restless legs Occasional dizziness  Blood pressure (!) 161/67, pulse 75, height 5\' 3"  (1.6 m), weight 107 lb 8 oz (48.8 kg).  Physical Exam  General: The patient is alert and cooperative at the time of the examination.  Eyes: Pupils are equal, round, and reactive to light. Discs are flat bilaterally.  Neck: The neck is supple, no carotid bruits are noted.  Respiratory: The respiratory examination is clear.  Cardiovascular: The cardiovascular examination reveals a regular rate and rhythm, no obvious murmurs or rubs are noted.  Skin: Extremities are without significant edema.  Neurologic Exam  Mental status: The patient is alert and oriented x 3 at the time of the examination. The patient has apparent normal recent and remote memory, with an apparently normal attention span and concentration ability.  Cranial nerves: Facial symmetry is present. There is good sensation of the face to pinprick and soft touch bilaterally. The strength of the facial muscles and the muscles to head turning and shoulder shrug are normal bilaterally. Speech is well enunciated, no aphasia or dysarthria is noted. Extraocular movements are full. Visual fields are full. The tongue is midline, and the patient has symmetric elevation of the soft palate. No obvious hearing deficits are noted.  Motor: The motor testing reveals 5 over 5 strength of all 4 extremities. Good symmetric motor tone is noted  throughout.  Sensory: Sensory testing is intact to pinprick, soft touch, vibration sensation, and position sense on all 4 extremities. No evidence of extinction is noted.  Coordination: Cerebellar testing reveals good finger-nose-finger and heel-to-shin bilaterally.  Gait and station: Gait is slightly wide-based, the patient uses a cane for ambulation. Tandem gait was not attempted.. Romberg is negative. No drift is seen.  Reflexes: Deep tendon reflexes are symmetric, but are depressed bilaterally. Toes are downgoing bilaterally.   Assessment/Plan:  1. Leg cramps, left greater than right  The patient is on magnesium supplementation. She has just recently been taken off of her diuretic. She is getting leg cramps on average twice a week mainly involving the left leg but can occur on the right as well. The patient will call me if the leg cramps  continue over the next couple weeks off of the diuretic, if that is the case we will start low-dose baclofen in the evening hours. She will follow-up in 6 months.  Jill Alexanders MD 06/12/2016 11:42 AM  [facility] [address] [place], [place] [address]  Phone [phone] Fax [phone]

## 2016-07-19 ENCOUNTER — Other Ambulatory Visit: Payer: Self-pay | Admitting: Internal Medicine

## 2016-07-19 DIAGNOSIS — N184 Chronic kidney disease, stage 4 (severe): Secondary | ICD-10-CM

## 2016-07-21 ENCOUNTER — Ambulatory Visit
Admission: RE | Admit: 2016-07-21 | Discharge: 2016-07-21 | Disposition: A | Discharge status: Home / Self Care | Payer: Medicare Other | Source: Ambulatory Visit | Attending: Internal Medicine | Admitting: Internal Medicine

## 2016-07-21 DIAGNOSIS — N2889 Other specified disorders of kidney and ureter: Secondary | ICD-10-CM | POA: Diagnosis not present

## 2016-07-21 DIAGNOSIS — N184 Chronic kidney disease, stage 4 (severe): Secondary | ICD-10-CM

## 2016-07-27 ENCOUNTER — Other Ambulatory Visit: Payer: Medicare Other

## 2016-08-08 DIAGNOSIS — E0590 Thyrotoxicosis, unspecified without thyrotoxic crisis or storm: Secondary | ICD-10-CM | POA: Diagnosis not present

## 2016-08-09 ENCOUNTER — Other Ambulatory Visit (HOSPITAL_COMMUNITY): Payer: Self-pay | Admitting: Endocrinology

## 2016-08-09 DIAGNOSIS — E0590 Thyrotoxicosis, unspecified without thyrotoxic crisis or storm: Secondary | ICD-10-CM

## 2016-08-14 ENCOUNTER — Ambulatory Visit (HOSPITAL_COMMUNITY): Payer: Medicare Other

## 2016-08-14 ENCOUNTER — Encounter (HOSPITAL_COMMUNITY): Payer: Self-pay

## 2016-08-15 ENCOUNTER — Encounter (HOSPITAL_COMMUNITY): Payer: Medicare Other

## 2016-08-28 DIAGNOSIS — M810 Age-related osteoporosis without current pathological fracture: Secondary | ICD-10-CM | POA: Diagnosis not present

## 2016-08-28 DIAGNOSIS — E538 Deficiency of other specified B group vitamins: Secondary | ICD-10-CM | POA: Diagnosis not present

## 2016-08-28 DIAGNOSIS — D649 Anemia, unspecified: Secondary | ICD-10-CM | POA: Diagnosis not present

## 2016-08-28 DIAGNOSIS — I10 Essential (primary) hypertension: Secondary | ICD-10-CM | POA: Diagnosis not present

## 2016-09-04 DIAGNOSIS — E538 Deficiency of other specified B group vitamins: Secondary | ICD-10-CM | POA: Diagnosis not present

## 2016-09-04 DIAGNOSIS — E0590 Thyrotoxicosis, unspecified without thyrotoxic crisis or storm: Secondary | ICD-10-CM | POA: Diagnosis not present

## 2016-09-04 DIAGNOSIS — I10 Essential (primary) hypertension: Secondary | ICD-10-CM | POA: Diagnosis not present

## 2016-09-28 ENCOUNTER — Other Ambulatory Visit: Payer: Self-pay | Admitting: Internal Medicine

## 2016-09-28 DIAGNOSIS — N2889 Other specified disorders of kidney and ureter: Secondary | ICD-10-CM

## 2016-10-09 DIAGNOSIS — N189 Chronic kidney disease, unspecified: Secondary | ICD-10-CM | POA: Diagnosis not present

## 2016-10-09 DIAGNOSIS — D3002 Benign neoplasm of left kidney: Secondary | ICD-10-CM | POA: Diagnosis not present

## 2016-10-17 DIAGNOSIS — R93422 Abnormal radiologic findings on diagnostic imaging of left kidney: Secondary | ICD-10-CM | POA: Diagnosis not present

## 2016-10-17 DIAGNOSIS — N2889 Other specified disorders of kidney and ureter: Secondary | ICD-10-CM | POA: Diagnosis not present

## 2016-10-17 DIAGNOSIS — D3000 Benign neoplasm of unspecified kidney: Secondary | ICD-10-CM | POA: Diagnosis not present

## 2016-10-25 ENCOUNTER — Ambulatory Visit (INDEPENDENT_AMBULATORY_CARE_PROVIDER_SITE_OTHER): Payer: Medicare Other | Admitting: *Deleted

## 2016-10-25 DIAGNOSIS — I441 Atrioventricular block, second degree: Secondary | ICD-10-CM | POA: Diagnosis not present

## 2016-10-26 NOTE — Progress Notes (Signed)
Pacemaker check in clinic. Normal device function. Thresholds, sensing, impedances consistent with previous measurements. Device programmed to maximize longevity. AMS~ AT. No high ventricular rates noted. Device programmed at appropriate safety margins. Histogram distribution appropriate for patient activity level. Device programmed to optimize intrinsic conduction. Estimated longevity 9.9 months. Remote 01/24/2017. ROV w/ [facility] 01/2017. Cell adapter given to pt. Patient education completed.

## 2016-11-06 DIAGNOSIS — Z905 Acquired absence of kidney: Secondary | ICD-10-CM | POA: Diagnosis not present

## 2016-11-06 DIAGNOSIS — N1330 Unspecified hydronephrosis: Secondary | ICD-10-CM | POA: Diagnosis not present

## 2016-11-06 DIAGNOSIS — N189 Chronic kidney disease, unspecified: Secondary | ICD-10-CM | POA: Diagnosis not present

## 2016-11-14 ENCOUNTER — Other Ambulatory Visit: Payer: Self-pay | Admitting: Cardiovascular Disease

## 2016-12-05 DIAGNOSIS — R634 Abnormal weight loss: Secondary | ICD-10-CM | POA: Diagnosis not present

## 2016-12-05 DIAGNOSIS — I10 Essential (primary) hypertension: Secondary | ICD-10-CM | POA: Diagnosis not present

## 2016-12-05 DIAGNOSIS — E0590 Thyrotoxicosis, unspecified without thyrotoxic crisis or storm: Secondary | ICD-10-CM | POA: Diagnosis not present

## 2016-12-18 ENCOUNTER — Ambulatory Visit: Payer: Medicare Other | Admitting: Neurology

## 2017-01-18 DIAGNOSIS — H401131 Primary open-angle glaucoma, bilateral, mild stage: Secondary | ICD-10-CM | POA: Diagnosis not present

## 2017-01-18 DIAGNOSIS — H524 Presbyopia: Secondary | ICD-10-CM | POA: Diagnosis not present

## 2017-01-24 ENCOUNTER — Encounter: Payer: Medicare Other | Admitting: Cardiovascular Disease

## 2017-01-24 ENCOUNTER — Ambulatory Visit (INDEPENDENT_AMBULATORY_CARE_PROVIDER_SITE_OTHER): Payer: Medicare Other | Admitting: *Deleted

## 2017-01-24 DIAGNOSIS — I495 Sick sinus syndrome: Secondary | ICD-10-CM | POA: Diagnosis not present

## 2017-01-24 NOTE — Progress Notes (Signed)
Remote pacemaker transmission.   

## 2017-01-30 ENCOUNTER — Encounter: Payer: Self-pay | Admitting: Cardiology

## 2017-02-14 ENCOUNTER — Ambulatory Visit (INDEPENDENT_AMBULATORY_CARE_PROVIDER_SITE_OTHER): Payer: Self-pay | Admitting: *Deleted

## 2017-02-14 DIAGNOSIS — I495 Sick sinus syndrome: Secondary | ICD-10-CM

## 2017-02-15 DIAGNOSIS — I10 Essential (primary) hypertension: Secondary | ICD-10-CM | POA: Diagnosis not present

## 2017-02-15 DIAGNOSIS — E0590 Thyrotoxicosis, unspecified without thyrotoxic crisis or storm: Secondary | ICD-10-CM | POA: Diagnosis not present

## 2017-02-15 DIAGNOSIS — E538 Deficiency of other specified B group vitamins: Secondary | ICD-10-CM | POA: Diagnosis not present

## 2017-02-15 LAB — LIPID PANEL
Cholesterol: 159 (ref 0–200)
HDL: 63 (ref 35–70)
LDL Cholesterol: 85
Triglycerides: 57 (ref 40–160)

## 2017-02-15 LAB — CUP PACEART REMOTE DEVICE CHECK
Battery Remaining Longevity: 1 mo
Battery Remaining Percentage: 1 %
Battery Voltage: 2.63 V
Brady Statistic AP VP Percent: 86 %
Brady Statistic AP VS Percent: 1.4 %
Brady Statistic AS VP Percent: 6.7 %
Brady Statistic AS VS Percent: 6.1 %
Brady Statistic RA Percent Paced: 87 %
Brady Statistic RV Percent Paced: 92 %
Date Time Interrogation Session: 20181212154025
Implantable Lead Implant Date: 20030714
Implantable Lead Implant Date: 20030714
Implantable Lead Location: 753859
Implantable Lead Location: 753860
Implantable Pulse Generator Implant Date: 20101006
Lead Channel Impedance Value: 350 Ohm
Lead Channel Impedance Value: 450 Ohm
Lead Channel Pacing Threshold Amplitude: 0.625 V
Lead Channel Pacing Threshold Amplitude: 1.125 V
Lead Channel Pacing Threshold Pulse Width: 0.5 ms
Lead Channel Pacing Threshold Pulse Width: 0.5 ms
Lead Channel Sensing Intrinsic Amplitude: 0.7 mV
Lead Channel Sensing Intrinsic Amplitude: 2.3 mV
Lead Channel Setting Pacing Amplitude: 1.375
Lead Channel Setting Pacing Amplitude: 1.625
Lead Channel Setting Pacing Pulse Width: 0.5 ms
Lead Channel Setting Sensing Sensitivity: 0.5 mV
Pulse Gen Model: 2110
Pulse Gen Serial Number: 2298006

## 2017-02-15 LAB — TSH: TSH: 0.11 — AB (ref <=5.90)

## 2017-02-15 LAB — VITAMIN B12: Vitamin B-12: 2436

## 2017-02-15 NOTE — Progress Notes (Signed)
Remote pacemaker transmission.   

## 2017-02-16 ENCOUNTER — Encounter: Payer: Self-pay | Admitting: Cardiology

## 2017-02-22 DIAGNOSIS — E039 Hypothyroidism, unspecified: Secondary | ICD-10-CM | POA: Diagnosis not present

## 2017-02-22 DIAGNOSIS — D649 Anemia, unspecified: Secondary | ICD-10-CM | POA: Diagnosis not present

## 2017-02-22 DIAGNOSIS — Z0000 Encounter for general adult medical examination without abnormal findings: Secondary | ICD-10-CM | POA: Diagnosis not present

## 2017-03-02 LAB — CUP PACEART REMOTE DEVICE CHECK
Battery Remaining Longevity: 1 mo
Battery Remaining Percentage: 1 %
Battery Voltage: 2.63 V
Brady Statistic AP VP Percent: 85 %
Brady Statistic AP VS Percent: 1.4 %
Brady Statistic AS VP Percent: 7.1 %
Brady Statistic AS VS Percent: 6.8 %
Brady Statistic RA Percent Paced: 86 %
Brady Statistic RV Percent Paced: 92 %
Date Time Interrogation Session: 20190102163107
Implantable Lead Implant Date: 20030714
Implantable Lead Implant Date: 20030714
Implantable Lead Location: 753859
Implantable Lead Location: 753860
Implantable Pulse Generator Implant Date: 20101006
Lead Channel Impedance Value: 340 Ohm
Lead Channel Impedance Value: 450 Ohm
Lead Channel Pacing Threshold Amplitude: 0.625 V
Lead Channel Pacing Threshold Amplitude: 0.75 V
Lead Channel Pacing Threshold Pulse Width: 0.5 ms
Lead Channel Pacing Threshold Pulse Width: 0.5 ms
Lead Channel Sensing Intrinsic Amplitude: 0.5 mV
Lead Channel Sensing Intrinsic Amplitude: 2.1 mV
Lead Channel Setting Pacing Amplitude: 1 V
Lead Channel Setting Pacing Amplitude: 1.625
Lead Channel Setting Pacing Pulse Width: 0.5 ms
Lead Channel Setting Sensing Sensitivity: 0.5 mV
Pulse Gen Model: 2110
Pulse Gen Serial Number: 2298006

## 2017-03-19 ENCOUNTER — Ambulatory Visit (INDEPENDENT_AMBULATORY_CARE_PROVIDER_SITE_OTHER): Payer: Medicare Other | Admitting: *Deleted

## 2017-03-19 DIAGNOSIS — I495 Sick sinus syndrome: Secondary | ICD-10-CM

## 2017-03-20 ENCOUNTER — Encounter: Payer: Self-pay | Admitting: Cardiology

## 2017-03-20 NOTE — Progress Notes (Signed)
Remote pacemaker transmission.   

## 2017-04-14 LAB — CUP PACEART REMOTE DEVICE CHECK
Battery Remaining Longevity: 1 mo
Battery Remaining Percentage: 0.5 %
Battery Voltage: 2.6 V
Brady Statistic AP VP Percent: 85 %
Brady Statistic AP VS Percent: 1.3 %
Brady Statistic AS VP Percent: 7.2 %
Brady Statistic AS VS Percent: 6.4 %
Brady Statistic RA Percent Paced: 86 %
Brady Statistic RV Percent Paced: 92 %
Date Time Interrogation Session: 20190204151603
Implantable Lead Implant Date: 20030714
Implantable Lead Implant Date: 20030714
Implantable Lead Location: 753859
Implantable Lead Location: 753860
Implantable Pulse Generator Implant Date: 20101006
Lead Channel Impedance Value: 350 Ohm
Lead Channel Impedance Value: 480 Ohm
Lead Channel Pacing Threshold Amplitude: 0.625 V
Lead Channel Pacing Threshold Amplitude: 1 V
Lead Channel Pacing Threshold Pulse Width: 0.5 ms
Lead Channel Pacing Threshold Pulse Width: 0.5 ms
Lead Channel Sensing Intrinsic Amplitude: 0.4 mV
Lead Channel Sensing Intrinsic Amplitude: 2.3 mV
Lead Channel Setting Pacing Amplitude: 1.25 V
Lead Channel Setting Pacing Amplitude: 1.625
Lead Channel Setting Pacing Pulse Width: 0.5 ms
Lead Channel Setting Sensing Sensitivity: 0.5 mV
Pulse Gen Model: 2110
Pulse Gen Serial Number: 2298006

## 2017-04-17 ENCOUNTER — Encounter: Payer: Self-pay | Admitting: Cardiovascular Disease

## 2017-04-17 ENCOUNTER — Ambulatory Visit (INDEPENDENT_AMBULATORY_CARE_PROVIDER_SITE_OTHER): Payer: Medicare Other | Admitting: Cardiovascular Disease

## 2017-04-17 VITALS — BP 160/72 | HR 135 | Ht 63.0 in | Wt 104.0 lb

## 2017-04-17 DIAGNOSIS — Z950 Presence of cardiac pacemaker: Secondary | ICD-10-CM | POA: Diagnosis not present

## 2017-04-17 DIAGNOSIS — I739 Peripheral vascular disease, unspecified: Secondary | ICD-10-CM | POA: Diagnosis not present

## 2017-04-17 DIAGNOSIS — I10 Essential (primary) hypertension: Secondary | ICD-10-CM | POA: Diagnosis not present

## 2017-04-17 DIAGNOSIS — Z953 Presence of xenogenic heart valve: Secondary | ICD-10-CM | POA: Diagnosis not present

## 2017-04-17 DIAGNOSIS — I441 Atrioventricular block, second degree: Secondary | ICD-10-CM

## 2017-04-17 DIAGNOSIS — I495 Sick sinus syndrome: Secondary | ICD-10-CM

## 2017-04-17 DIAGNOSIS — I2510 Atherosclerotic heart disease of native coronary artery without angina pectoris: Secondary | ICD-10-CM

## 2017-04-17 NOTE — H&P (View-Only) (Signed)
Patient ID: Holly Bolton, female   DOB: Aug 31, 1922, 84 y.o.   MRN: [id number]    Cardiology Office Note    Date:  04/18/2017   ID:  Holly Bolton, DOB 1922/11/09, MRN [id number]  PCP:  Holly Gravel, MD  Cardiologist:   Holly Klein, MD   Chief Complaint  Patient presents with  . Follow-up    no concerns     History of Present Illness:  Holly Bolton is a 84 y.o. female for follow up for PAD, CAD status post CABG, s/p AVR & ascending aortic aneurysm repair, pacemaker for SSS and 2nd deg AV block.  She has not had any new cardiac events.  She still complains of leg cramps at night when she stretches in bed.  She does not have intermittent claudication, but she is also very sedentary.  Her legs have become less steady.  Although her blood pressure was rather high today, at home SBP  is consistently around 135 mmHg she does have some orthostatic dizziness.  She denies palpitations, dizziness, syncope and  has not had any focal neurological events and denies bleeding problems.   Pacemaker interrogation in February shows normal device function, but her battery is very close to ERI.  Both tissue there is 2.59 V, but the device has not get spontaneously triggered the 2.60 V ERI indicator.  (dual-chamber [employer] DR non-RF device). She is not pacemaker dependent. She has 86 % atrial pacing and 93% ventricular pacing.  As before, she has had brief episodes of mode switch generally under 45 seconds, consistent with brief paroxysmal atrial tachycardia, but no true atrial fibrillation.  Her sensing parameters have steadily deteriorated since her initial device implantation, but the other lead parameters remain good.  P waves 0.5 mV, R waves 2.2 mV.  Atrial lead impedance 340 ohms, threshold 0.625V at 0.5 ms, ventricular impedance 440 ohms, threshold 0.75 V at 0.5 ms.  The sensing problems have not really related to any issues with normal device function.  She underwent aortic root replacement with  a Medtronic Freestyle root, with coronary reimplantation and a single vessel LIMA to LAD bypass in 1999 (for bicuspid valve with severe stenosis and ascending aortic aneurysm). She received a dual-chamber permanent pacemaker for sinus node dysfunction and intermittent high-grade AV block. She had a pacemaker generator change in 2010 ([address] Jude accent). She has hyperlipidemia but prefers not to take statin medication.    Past Medical History:  Diagnosis Date  . Aneurysm (arteriovenous) of coronary vessels    ascending aorta requiring bypass of the LAD  . Asthma   . Carotid stenosis 04/28/08   Doppler: <40% stenosis bilateral  . Hyperlipemia   . Hypertension   . Pacemaker generator end of life 11/18/08   Intermittent high-grade atrioventricular block  . Peripheral arterial disease (HCC)    left ABI of 0.78  . Presence of permanent cardiac pacemaker 08/26/01   Sinus node dysfunction-[facility]ude  . Status post ascending aortic aneurysm repair/AVR -  Medtronic Freestyle root 09/28/2014   ascending aorta requiring bypass of the LAD     Past Surgical History:  Procedure Laterality Date  . ABDOMINAL HYSTERECTOMY  1966  . APPENDECTOMY  1943  . ASCENDING AORTIC ANEURYSM REPAIR  09/22/97   porcine aortic root  . BREAST FIBROADENOMA SURGERY  11/89  . CARDIAC SURGERY    . CORONARY ARTERY BYPASS GRAFT  09/22/97   LIMA to the LAD  . [place]  left  . NEPHRECTOMY  1958   right  . OVARIAN CYST SURGERY  1948  . PACEMAKER GENERATOR CHANGE  11/18/08   [facility]  . PACEMAKER INSERTION  08/26/01   [facility]    Current Medications: Outpatient Medications Prior to Visit  Medication Sig Dispense Refill  . aspirin 81 MG tablet Take 81 mg by mouth daily.    . brimonidine (ALPHAGAN) 0.2 % ophthalmic solution instill ONE DROP IN EACH EYE TWICE DAILY  2  . carvedilol (COREG) 3.125 MG tablet Take 3.125 mg by mouth 2 (two) times daily with a meal.  6  . Cholecalciferol (VITAMIN D) 2000 units CAPS  Take 1 capsule by mouth daily.    . CO ENZYME Q-10 PO Take 1 capsule by mouth daily.    . furosemide (LASIX) 20 MG tablet Take 20 mg by mouth 2 (two) times a week.    . Magnesium 250 MG TABS Take 1 tablet (250 mg total) by mouth 2 (two) times daily. 60 tablet 11  . olmesartan (BENICAR) 20 MG tablet Take 20 mg by mouth 2 (two) times daily.    . vitamin B-12 (CYANOCOBALAMIN) 1000 MCG tablet Take 1,000 mcg by mouth daily.    . ALPHAGAN P 0.1 % SOLN Place 1 application into both eyes 2 (two) times daily.  1   No facility-administered medications prior to visit.      Allergies:   Accupril [quinapril hcl]; Amlodipine; Benadryl [diphenhydramine hcl]; Biaxin [clarithromycin]; Ciprofloxacin; Codeine; Diovan [valsartan]; Medrol [methylprednisolone]; Morphine and related; Neurontin [gabapentin]; and Penicillins   Social History   Socioeconomic History  . Marital status: Widowed    Spouse name: None  . Number of children: 1  . Years of education: 32  . Highest education level: None  Social Needs  . Financial resource strain: None  . Food insecurity - worry: None  . Food insecurity - inability: None  . Transportation needs - medical: None  . Transportation needs - non-medical: None  Occupational History  . Occupation: Retired  Tobacco Use  . Smoking status: Never Smoker  . Smokeless tobacco: Never Used  Substance and Sexual Activity  . Alcohol use: No  . Drug use: No  . Sexual activity: None  Other Topics Concern  . None  Social History Narrative   Lives with son, Holly Bolton   Caffeine use: 1 cup coffee per day   Right handed     Family History:  The patient's family history includes Heart attack in her brother, father, and mother; Hyperlipidemia in her sister; Hypertension in her sister.   ROS:   Please see the history of present illness.    ROS All other systems reviewed and are negative.   PHYSICAL EXAM:   VS:  BP (!) 160/72   Pulse (!) 135   Ht 5\' 3"  (1.6 m)   Wt 104  lb (47.2 kg)   BMI 18.42 kg/m    GEN: Well nourished, well developed, in no acute distress  HEENT: normal  Neck: no JVD, carotid bruits, or masses Cardiac: Paradoxically split second heart sound, RRR; no murmurs, rubs, or gallops,no edema  Respiratory:  clear to auscultation bilaterally, normal work of breathing GI: soft, nontender, nondistended, + BS MS: no deformity or atrophy  Skin: warm and dry, no rash Neuro:  Alert and Oriented x 3, Strength and sensation are intact Psych: euthymic mood, full affect  Wt Readings from Last 3 Encounters:  04/17/17 104 lb (47.2 kg)  06/12/16 107 lb 8 oz (  48.8 kg)  12/15/15 105 lb (47.6 kg)      Studies/Labs Reviewed:   EKG:  EKG is ordered today.  Atrial paced-ventricular sensed and atrial sensed (sinus), ventricular sensed beats, right bundle branch block left anterior fascicular block on the native ventricular beats.  Study date: 07/13/2015.  - Left ventricle: Abnormal septal motion likely from pacing The   cavity size was normal. Systolic function was normal. The   estimated ejection fraction was in the range of 55% to 60%. Wall   motion was normal; there were no regional wall motion   abnormalities. Doppler parameters are consistent with abnormal   left ventricular relaxation (grade 1 diastolic dysfunction).   Doppler parameters are consistent with both elevated ventricular   end-diastolic filling pressure and elevated left atrial filling   pressure. - Aortic valve: There was trivial regurgitation. Valve area (VTI):   2 cm^2. Valve area (Vmax): 1.83 cm^2. Valve area (Vmean): 1.81   cm^2. - Left atrium: The atrium was mildly dilated. - Atrial septum: No defect or patent foramen ovale was identified. - Tricuspid valve: There was mild-moderate regurgitation.  Lipid Panel  Total cholesterol 170, HDL 63, LDL 99, triglycerides 57 Hemoglobin 10.1, creatinine 2.0, potassium 4.7  ASSESSMENT:    1. Coronary artery disease involving native  coronary artery of native heart without angina pectoris   2. Second degree AV block   3. SSS (sick sinus syndrome) ([place])   4. Pacemaker   5. Essential hypertension   6. History of aortic valve replacement with bioprosthetic valve   7. PAD (peripheral artery disease) (HCC)      PLAN:  In order of problems listed above:   1. 2nd deg AV block: high percentage of ventricular pacing, without evidence of congestive heart failure.  She has evidence of infrahisian disease with bifascicular block 2. SSS: She is quite sedentary, taking this into consideration her current heart rate histogram distribution is quite appropriate 3. PPM: Her generator is approaching elective replacement indicator.  Atrial and ventricular sensitivity are suboptimal, but not interfering with normal device function.  She has about 90% atrial pacing 90% ventricular pacing. Pacing thresholds remain excellent. I do not think it would be justified to place a new leads and this elderly lady due to the increased risk.  When the time comes I think she will just have a generator change out.  Discussed the general plan for change out with the patient and her son and the fact that this may become an opportunity anytime in the next few days or next couple of months. This procedure has been fully reviewed with the patient and written informed consent has been obtained. 4. HTN: Targeting a blood pressure of systolic under 563, due to orthostatic hypotension.  No changes made to her medications today. 5. CAD: She had coronary reimplantation of the time of aortic valve/aortic root surgery, but has never really had significant coronary obstruction that I am aware of.  She declines to take a statin. 6. S/P Asc Ao repair and bioAVR (Medtronic Freestyle): echo 2017 with normal findings. 7. PAD: Not particularly symptomatic but also very sedentary    Medication Adjustments/Labs and Tests Ordered: Current medicines are reviewed at length with the  patient today.  Concerns regarding medicines are outlined above.  Medication changes, Labs and Tests ordered today are listed in the Patient Instructions below. Patient Instructions  Dr Sallyanne Kuster recommends that you continue on your current medications as directed. Please refer to the Current Medication list given  to you today.  Remote monitoring is used to monitor your Pacemaker of ICD from home. This monitoring reduces the number of office visits required to check your device to one time per year. It allows [place] to keep an eye on the functioning of your device to ensure it is working properly. You are scheduled for a device check from home on Wednesday, March 13th, 2019. You may send your transmission at any time that day. If you have a wireless device, the transmission will be sent automatically. After your physician reviews your transmission, you will receive a postcard with your next transmission date.  Dr Sallyanne Kuster recommends that you schedule a follow-up appointment in 6 months with a pacemaker check. You will receive a reminder letter in the mail two months in advance. If you don't receive a letter, please call our office to schedule the follow-up appointment.  If you need a refill on your cardiac medications before your next appointment, please call your pharmacy.    Signed, Holly Klein, MD  04/18/2017 5:32 PM    [place], [place], [place]  [address] Phone: [phone]; Fax: [phone]

## 2017-04-17 NOTE — Progress Notes (Signed)
Patient ID: Holly Bolton, female   DOB: 1922/08/08, 84 y.o.   MRN: [id number]    Cardiology Office Note    Date:  04/18/2017   ID:  Holly Bolton, DOB [birth year], MRN [id number]  PCP:  Jani Gravel, MD  Cardiologist:   Sanda Klein, MD   Chief Complaint  Patient presents with  . Follow-up    no concerns     History of Present Illness:  Holly Bolton is a 84 y.o. female for follow up for PAD, CAD status post CABG, s/p AVR & ascending aortic aneurysm repair, pacemaker for SSS and 2nd deg AV block.  She has not had any new cardiac events.  She still complains of leg cramps at night when she stretches in bed.  She does not have intermittent claudication, but she is also very sedentary.  Her legs have become less steady.  Although her blood pressure was rather high today, at home SBP  is consistently around 135 mmHg she does have some orthostatic dizziness.  She denies palpitations, dizziness, syncope and  has not had any focal neurological events and denies bleeding problems.   Pacemaker interrogation in February shows normal device function, but her battery is very close to ERI.  Both tissue there is 2.59 V, but the device has not get spontaneously triggered the 2.60 V ERI indicator.  (dual-chamber St[employer] Jude Accent DR non-RF device). She is not pacemaker dependent. She has 86 % atrial pacing and 93% ventricular pacing.  As before, she has had brief episodes of mode switch generally under 45 seconds, consistent with brief paroxysmal atrial tachycardia, but no true atrial fibrillation.  Her sensing parameters have steadily deteriorated since her initial device implantation, but the other lead parameters remain good.  P waves 0.5 mV, R waves 2.2 mV.  Atrial lead impedance 340 ohms, threshold 0.625V at 0.5 ms, ventricular impedance 440 ohms, threshold 0.75 V at 0.5 ms.  The sensing problems have not really related to any issues with normal device function.  She underwent aortic root replacement with  a Medtronic Freestyle root, with coronary reimplantation and a single vessel LIMA to LAD bypass in 1999 (for bicuspid valve with severe stenosis and ascending aortic aneurysm). She received a dual-chamber permanent pacemaker for sinus node dysfunction and intermittent high-grade AV block. She had a pacemaker generator change in 2010 ([address] Jude accent). She has hyperlipidemia but prefers not to take statin medication.    Past Medical History:  Diagnosis Date  . Aneurysm (arteriovenous) of coronary vessels    ascending aorta requiring bypass of the LAD  . Asthma   . Carotid stenosis 04/28/08   Doppler: <40% stenosis bilateral  . Hyperlipemia   . Hypertension   . Pacemaker generator end of life 11/18/08   Intermittent high-grade atrioventricular block  . Peripheral arterial disease (HCC)    left ABI of 0.78  . Presence of permanent cardiac pacemaker 08/26/01   Sinus node dysfunction-St.Jude  . Status post ascending aortic aneurysm repair/AVR -  Medtronic Freestyle root 09/28/2014   ascending aorta requiring bypass of the LAD     Past Surgical History:  Procedure Laterality Date  . ABDOMINAL HYSTERECTOMY  1966  . APPENDECTOMY  1943  . ASCENDING AORTIC ANEURYSM REPAIR  09/22/97   porcine aortic root  . BREAST FIBROADENOMA SURGERY  11/89  . CARDIAC SURGERY    . CORONARY ARTERY BYPASS GRAFT  09/22/97   LIMA to the LAD  . [place]  left  . NEPHRECTOMY  1958   right  . OVARIAN CYST SURGERY  1948  . PACEMAKER GENERATOR CHANGE  11/18/08   St.Jude  . PACEMAKER INSERTION  08/26/01   St.Jude    Current Medications: Outpatient Medications Prior to Visit  Medication Sig Dispense Refill  . aspirin 81 MG tablet Take 81 mg by mouth daily.    . brimonidine (ALPHAGAN) 0.2 % ophthalmic solution instill ONE DROP IN EACH EYE TWICE DAILY  2  . carvedilol (COREG) 3.125 MG tablet Take 3.125 mg by mouth 2 (two) times daily with a meal.  6  . Cholecalciferol (VITAMIN D) 2000 units CAPS  Take 1 capsule by mouth daily.    . CO ENZYME Q-10 PO Take 1 capsule by mouth daily.    . furosemide (LASIX) 20 MG tablet Take 20 mg by mouth 2 (two) times a week.    . Magnesium 250 MG TABS Take 1 tablet (250 mg total) by mouth 2 (two) times daily. 60 tablet 11  . olmesartan (BENICAR) 20 MG tablet Take 20 mg by mouth 2 (two) times daily.    . vitamin B-12 (CYANOCOBALAMIN) 1000 MCG tablet Take 1,000 mcg by mouth daily.    . ALPHAGAN P 0.1 % SOLN Place 1 application into both eyes 2 (two) times daily.  1   No facility-administered medications prior to visit.      Allergies:   Accupril [quinapril hcl]; Amlodipine; Benadryl [diphenhydramine hcl]; Biaxin [clarithromycin]; Ciprofloxacin; Codeine; Diovan [valsartan]; Medrol [methylprednisolone]; Morphine and related; Neurontin [gabapentin]; and Penicillins   Social History   Socioeconomic History  . Marital status: Widowed    Spouse name: None  . Number of children: 1  . Years of education: 61  . Highest education level: None  Social Needs  . Financial resource strain: None  . Food insecurity - worry: None  . Food insecurity - inability: None  . Transportation needs - medical: None  . Transportation needs - non-medical: None  Occupational History  . Occupation: Retired  Tobacco Use  . Smoking status: Never Smoker  . Smokeless tobacco: Never Used  Substance and Sexual Activity  . Alcohol use: No  . Drug use: No  . Sexual activity: None  Other Topics Concern  . None  Social History Narrative   Lives with son, Holly Bolton   Caffeine use: 1 cup coffee per day   Right handed     Family History:  The patient's family history includes Heart attack in her brother, father, and mother; Hyperlipidemia in her sister; Hypertension in her sister.   ROS:   Please see the history of present illness.    ROS All other systems reviewed and are negative.   PHYSICAL EXAM:   VS:  BP (!) 160/72   Pulse (!) 135   Ht 5\' 3"  (1.6 m)   Wt 104  lb (47.2 kg)   BMI 18.42 kg/m    GEN: Well nourished, well developed, in no acute distress  HEENT: normal  Neck: no JVD, carotid bruits, or masses Cardiac: Paradoxically split second heart sound, RRR; no murmurs, rubs, or gallops,no edema  Respiratory:  clear to auscultation bilaterally, normal work of breathing GI: soft, nontender, nondistended, + BS MS: no deformity or atrophy  Skin: warm and dry, no rash Neuro:  Alert and Oriented x 3, Strength and sensation are intact Psych: euthymic mood, full affect  Wt Readings from Last 3 Encounters:  04/17/17 104 lb (47.2 kg)  06/12/16 107 lb 8 oz (  48.8 kg)  12/15/15 105 lb (47.6 kg)      Studies/Labs Reviewed:   EKG:  EKG is ordered today.  Atrial paced-ventricular sensed and atrial sensed (sinus), ventricular sensed beats, right bundle branch block left anterior fascicular block on the native ventricular beats.  Study date: 07/13/2015.  - Left ventricle: Abnormal septal motion likely from pacing The   cavity size was normal. Systolic function was normal. The   estimated ejection fraction was in the range of 55% to 60%. Wall   motion was normal; there were no regional wall motion   abnormalities. Doppler parameters are consistent with abnormal   left ventricular relaxation (grade 1 diastolic dysfunction).   Doppler parameters are consistent with both elevated ventricular   end-diastolic filling pressure and elevated left atrial filling   pressure. - Aortic valve: There was trivial regurgitation. Valve area (VTI):   2 cm^2. Valve area (Vmax): 1.83 cm^2. Valve area (Vmean): 1.81   cm^2. - Left atrium: The atrium was mildly dilated. - Atrial septum: No defect or patent foramen ovale was identified. - Tricuspid valve: There was mild-moderate regurgitation.  Lipid Panel  Total cholesterol 170, HDL 63, LDL 99, triglycerides 57 Hemoglobin 10.1, creatinine 2.0, potassium 4.7  ASSESSMENT:    1. Coronary artery disease involving native  coronary artery of native heart without angina pectoris   2. Second degree AV block   3. SSS (sick sinus syndrome) (Pace)   4. Pacemaker   5. Essential hypertension   6. History of aortic valve replacement with bioprosthetic valve   7. PAD (peripheral artery disease) (HCC)      PLAN:  In order of problems listed above:   1. 2nd deg AV block: high percentage of ventricular pacing, without evidence of congestive heart failure.  She has evidence of infrahisian disease with bifascicular block 2. SSS: She is quite sedentary, taking this into consideration her current heart rate histogram distribution is quite appropriate 3. PPM: Her generator is approaching elective replacement indicator.  Atrial and ventricular sensitivity are suboptimal, but not interfering with normal device function.  She has about 90% atrial pacing 90% ventricular pacing. Pacing thresholds remain excellent. I do not think it would be justified to place a new leads and this elderly lady due to the increased risk.  When the time comes I think she will just have a generator change out.  Discussed the general plan for change out with the patient and her son and the fact that this may become an opportunity anytime in the next few days or next couple of months. This procedure has been fully reviewed with the patient and written informed consent has been obtained. 4. HTN: Targeting a blood pressure of systolic under 081, due to orthostatic hypotension.  No changes made to her medications today. 5. CAD: She had coronary reimplantation of the time of aortic valve/aortic root surgery, but has never really had significant coronary obstruction that I am aware of.  She declines to take a statin. 6. S/P Asc Ao repair and bioAVR (Medtronic Freestyle): echo 2017 with normal findings. 7. PAD: Not particularly symptomatic but also very sedentary    Medication Adjustments/Labs and Tests Ordered: Current medicines are reviewed at length with the  patient today.  Concerns regarding medicines are outlined above.  Medication changes, Labs and Tests ordered today are listed in the Patient Instructions below. Patient Instructions  Dr Sallyanne Kuster recommends that you continue on your current medications as directed. Please refer to the Current Medication list given  to you today.  Remote monitoring is used to monitor your Pacemaker of ICD from home. This monitoring reduces the number of office visits required to check your device to one time per year. It allows [place] to keep an eye on the functioning of your device to ensure it is working properly. You are scheduled for a device check from home on Wednesday, March 13th, 2019. You may send your transmission at any time that day. If you have a wireless device, the transmission will be sent automatically. After your physician reviews your transmission, you will receive a postcard with your next transmission date.  Dr Sallyanne Kuster recommends that you schedule a follow-up appointment in 6 months with a pacemaker check. You will receive a reminder letter in the mail two months in advance. If you don't receive a letter, please call our office to schedule the follow-up appointment.  If you need a refill on your cardiac medications before your next appointment, please call your pharmacy.    Signed, Sanda Klein, MD  04/18/2017 5:32 PM    [address], [place], [place]  [address] Phone: [phone]; Fax: [phone]

## 2017-04-17 NOTE — Patient Instructions (Signed)
Dr Sallyanne Kuster recommends that you continue on your current medications as directed. Please refer to the Current Medication list given to you today.  Remote monitoring is used to monitor your Pacemaker of ICD from home. This monitoring reduces the number of office visits required to check your device to one time per year. It allows [place] to keep an eye on the functioning of your device to ensure it is working properly. You are scheduled for a device check from home on Wednesday, March 13th, 2019. You may send your transmission at any time that day. If you have a wireless device, the transmission will be sent automatically. After your physician reviews your transmission, you will receive a postcard with your next transmission date.  Dr Sallyanne Kuster recommends that you schedule a follow-up appointment in 6 months with a pacemaker check. You will receive a reminder letter in the mail two months in advance. If you don't receive a letter, please call our office to schedule the follow-up appointment.  If you need a refill on your cardiac medications before your next appointment, please call your pharmacy.

## 2017-04-25 ENCOUNTER — Ambulatory Visit (INDEPENDENT_AMBULATORY_CARE_PROVIDER_SITE_OTHER): Payer: Medicare Other | Admitting: *Deleted

## 2017-04-25 ENCOUNTER — Telehealth: Payer: Self-pay | Admitting: Cardiology

## 2017-04-25 DIAGNOSIS — I495 Sick sinus syndrome: Secondary | ICD-10-CM | POA: Diagnosis not present

## 2017-04-25 LAB — CUP PACEART INCLINIC DEVICE CHECK
Date Time Interrogation Session: 20190313134839
Implantable Lead Implant Date: 20030714
Implantable Lead Implant Date: 20030714
Implantable Lead Location: 753859
Implantable Lead Location: 753860
Implantable Pulse Generator Implant Date: 20101006
Lead Channel Setting Pacing Amplitude: 1.25 V
Lead Channel Setting Pacing Amplitude: 1.625
Lead Channel Setting Pacing Pulse Width: 0.5 ms
Lead Channel Setting Sensing Sensitivity: 0.5 mV
Pulse Gen Model: 2110
Pulse Gen Serial Number: 2298006

## 2017-04-25 NOTE — Telephone Encounter (Signed)
Spoke with pt and reminded pt of remote transmission that is due today. Pt verbalized understanding.   

## 2017-04-26 ENCOUNTER — Encounter: Payer: Self-pay | Admitting: Cardiology

## 2017-04-26 NOTE — Progress Notes (Signed)
Remote pacemaker transmission.   

## 2017-05-13 LAB — CUP PACEART REMOTE DEVICE CHECK
Battery Remaining Longevity: 1 mo
Battery Remaining Percentage: 0.5 %
Battery Voltage: 2.59 V
Brady Statistic AP VP Percent: 86 %
Brady Statistic AP VS Percent: 1.1 %
Brady Statistic AS VP Percent: 7.6 %
Brady Statistic AS VS Percent: 5.5 %
Brady Statistic RA Percent Paced: 86 %
Brady Statistic RV Percent Paced: 93 %
Date Time Interrogation Session: 20190313191214
Implantable Lead Implant Date: 20030714
Implantable Lead Implant Date: 20030714
Implantable Lead Location: 753859
Implantable Lead Location: 753860
Implantable Pulse Generator Implant Date: 20101006
Lead Channel Impedance Value: 350 Ohm
Lead Channel Impedance Value: 460 Ohm
Lead Channel Pacing Threshold Amplitude: 0.625 V
Lead Channel Pacing Threshold Amplitude: 0.75 V
Lead Channel Pacing Threshold Pulse Width: 0.5 ms
Lead Channel Pacing Threshold Pulse Width: 0.5 ms
Lead Channel Sensing Intrinsic Amplitude: 0.5 mV
Lead Channel Sensing Intrinsic Amplitude: 2.3 mV
Lead Channel Setting Pacing Amplitude: 1 V
Lead Channel Setting Pacing Amplitude: 1.625
Lead Channel Setting Pacing Pulse Width: 0.5 ms
Lead Channel Setting Sensing Sensitivity: 0.5 mV
Pulse Gen Model: 2110
Pulse Gen Serial Number: 2298006

## 2017-05-14 ENCOUNTER — Encounter: Payer: Self-pay | Admitting: *Deleted

## 2017-05-14 ENCOUNTER — Other Ambulatory Visit: Payer: Self-pay

## 2017-05-14 DIAGNOSIS — Z45010 Encounter for checking and testing of cardiac pacemaker pulse generator [battery]: Secondary | ICD-10-CM

## 2017-05-14 DIAGNOSIS — I441 Atrioventricular block, second degree: Secondary | ICD-10-CM

## 2017-05-14 DIAGNOSIS — Z01812 Encounter for preprocedural laboratory examination: Secondary | ICD-10-CM

## 2017-05-14 DIAGNOSIS — I495 Sick sinus syndrome: Secondary | ICD-10-CM

## 2017-05-14 DIAGNOSIS — Z950 Presence of cardiac pacemaker: Secondary | ICD-10-CM

## 2017-05-14 NOTE — Addendum Note (Signed)
Addended by: Diana Eves on: 05/14/2017 02:30 PM   Modules accepted: Orders

## 2017-05-14 NOTE — Progress Notes (Signed)
[facility] - can we do this Wednesday 04/03? Preferable, because next option is 4/24. MCr

## 2017-05-14 NOTE — Progress Notes (Signed)
Patient notified. PPM gen change scheduled for Wednesday, 05/16/17 at 1:30p. Patient gave verbal okay to speak with son. Recommendations reviewed. Son verbalized understanding and agreed with plan.   Patient is scheduled for labs tomorrow at Dr Julianne Rice office for routine blood work.  Instructions briefly reviewed with patient and son. Son will stop by this afternoon to pick up surgical scrub and instruction letter. Will also send STAT lab orders for pre-op blood work.

## 2017-05-14 NOTE — Progress Notes (Signed)
Reviewed most recent remote check with Karilyn Cota (St. Jude). Device ERI per magnet rate (ERI is 86.3bpm, she is 84.4bpm). Ms. Whan made aware that she will receive a call to arrange generator replacement.  Routed to Dr. Sallyanne Kuster for notification.

## 2017-05-15 DIAGNOSIS — E039 Hypothyroidism, unspecified: Secondary | ICD-10-CM | POA: Diagnosis not present

## 2017-05-15 DIAGNOSIS — Z01818 Encounter for other preprocedural examination: Secondary | ICD-10-CM | POA: Diagnosis not present

## 2017-05-15 DIAGNOSIS — E0590 Thyrotoxicosis, unspecified without thyrotoxic crisis or storm: Secondary | ICD-10-CM | POA: Diagnosis not present

## 2017-05-15 DIAGNOSIS — D649 Anemia, unspecified: Secondary | ICD-10-CM | POA: Diagnosis not present

## 2017-05-15 LAB — CBC AND DIFFERENTIAL
HCT: 34 — AB (ref 36–46)
Hemoglobin: 11 — AB (ref 12.0–16.0)
Platelets: 224 (ref 150–399)
WBC: 6.6

## 2017-05-15 LAB — BASIC METABOLIC PANEL
BUN: 27 — AB (ref 4–21)
Creatinine: 1.4 — AB (ref 0.5–1.1)
Glucose: 94
Potassium: 4.9 (ref 3.4–5.3)
Sodium: 142 (ref 137–147)

## 2017-05-15 LAB — HEPATIC FUNCTION PANEL
ALT: 16 (ref 7–35)
AST: 21 (ref 13–35)
Alkaline Phosphatase: 101 (ref 25–125)
Bilirubin, Total: 0.9

## 2017-05-15 LAB — PROTIME-INR

## 2017-05-16 ENCOUNTER — Ambulatory Visit (HOSPITAL_COMMUNITY)
Admission: RE | Admit: 2017-05-16 | Discharge: 2017-05-16 | Disposition: A | Discharge status: Home / Self Care | Payer: Medicare Other | Source: Ambulatory Visit | Attending: Cardiovascular Disease | Admitting: Cardiovascular Disease

## 2017-05-16 ENCOUNTER — Encounter (HOSPITAL_COMMUNITY)
Admission: RE | Disposition: A | Discharge status: Home / Self Care | Payer: Self-pay | Source: Ambulatory Visit | Attending: Cardiovascular Disease

## 2017-05-16 DIAGNOSIS — I495 Sick sinus syndrome: Secondary | ICD-10-CM | POA: Diagnosis not present

## 2017-05-16 DIAGNOSIS — Z880 Allergy status to penicillin: Secondary | ICD-10-CM | POA: Diagnosis not present

## 2017-05-16 DIAGNOSIS — Z7982 Long term (current) use of aspirin: Secondary | ICD-10-CM | POA: Insufficient documentation

## 2017-05-16 DIAGNOSIS — Z951 Presence of aortocoronary bypass graft: Secondary | ICD-10-CM | POA: Insufficient documentation

## 2017-05-16 DIAGNOSIS — Z950 Presence of cardiac pacemaker: Secondary | ICD-10-CM

## 2017-05-16 DIAGNOSIS — Z953 Presence of xenogenic heart valve: Secondary | ICD-10-CM | POA: Insufficient documentation

## 2017-05-16 DIAGNOSIS — I2510 Atherosclerotic heart disease of native coronary artery without angina pectoris: Secondary | ICD-10-CM | POA: Diagnosis not present

## 2017-05-16 DIAGNOSIS — Z888 Allergy status to other drugs, medicaments and biological substances status: Secondary | ICD-10-CM | POA: Diagnosis not present

## 2017-05-16 DIAGNOSIS — I441 Atrioventricular block, second degree: Secondary | ICD-10-CM | POA: Diagnosis not present

## 2017-05-16 DIAGNOSIS — Z45010 Encounter for checking and testing of cardiac pacemaker pulse generator [battery]: Secondary | ICD-10-CM | POA: Diagnosis not present

## 2017-05-16 DIAGNOSIS — J45909 Unspecified asthma, uncomplicated: Secondary | ICD-10-CM | POA: Diagnosis not present

## 2017-05-16 DIAGNOSIS — I739 Peripheral vascular disease, unspecified: Secondary | ICD-10-CM | POA: Diagnosis not present

## 2017-05-16 DIAGNOSIS — Z881 Allergy status to other antibiotic agents status: Secondary | ICD-10-CM | POA: Diagnosis not present

## 2017-05-16 DIAGNOSIS — Z885 Allergy status to narcotic agent status: Secondary | ICD-10-CM | POA: Insufficient documentation

## 2017-05-16 DIAGNOSIS — Z79899 Other long term (current) drug therapy: Secondary | ICD-10-CM | POA: Insufficient documentation

## 2017-05-16 DIAGNOSIS — E785 Hyperlipidemia, unspecified: Secondary | ICD-10-CM | POA: Insufficient documentation

## 2017-05-16 DIAGNOSIS — I10 Essential (primary) hypertension: Secondary | ICD-10-CM | POA: Insufficient documentation

## 2017-05-16 DIAGNOSIS — Z45018 Encounter for adjustment and management of other part of cardiac pacemaker: Secondary | ICD-10-CM | POA: Diagnosis not present

## 2017-05-16 HISTORY — PX: PPM GENERATOR CHANGEOUT: EP1233

## 2017-05-16 LAB — BASIC METABOLIC PANEL
Anion gap: 9 (ref 5–15)
BUN: 30 mg/dL — ABNORMAL HIGH (ref 6–20)
CO2: 22 mmol/L (ref 22–32)
Calcium: 9.1 mg/dL (ref 8.9–10.3)
Chloride: 107 mmol/L (ref 101–111)
Creatinine, Ser: 1.73 mg/dL — ABNORMAL HIGH (ref 0.44–1.00)
GFR calc Af Amer: 28 mL/min — ABNORMAL LOW (ref >60)
GFR calc non Af Amer: 24 mL/min — ABNORMAL LOW (ref >60)
Glucose, Bld: 109 mg/dL — ABNORMAL HIGH (ref 65–99)
Potassium: 4.9 mmol/L (ref 3.5–5.1)
Sodium: 138 mmol/L (ref 135–145)

## 2017-05-16 LAB — SURGICAL PCR SCREEN
MRSA, PCR: NEGATIVE
Staphylococcus aureus: NEGATIVE

## 2017-05-16 LAB — CBC
HCT: 32.1 % — ABNORMAL LOW (ref 36.0–46.0)
Hemoglobin: 10.2 g/dL — ABNORMAL LOW (ref 12.0–15.0)
MCH: 30.9 pg (ref 26.0–34.0)
MCHC: 31.8 g/dL (ref 30.0–36.0)
MCV: 97.3 fL (ref 78.0–100.0)
Platelets: 209 10*3/uL (ref 150–400)
RBC: 3.3 MIL/uL — ABNORMAL LOW (ref 3.87–5.11)
RDW: 15.8 % — ABNORMAL HIGH (ref 11.5–15.5)
WBC: 6 10*3/uL (ref 4.0–10.5)

## 2017-05-16 SURGERY — PPM GENERATOR CHANGEOUT
Anesthesia: LOCAL

## 2017-05-16 MED ORDER — MUPIROCIN 2 % EX OINT
1.0000 "application " | TOPICAL_OINTMENT | Freq: Once | CUTANEOUS | Status: AC
  Administered 2017-05-16: 1 via TOPICAL

## 2017-05-16 MED ORDER — MUPIROCIN 2 % EX OINT
TOPICAL_OINTMENT | CUTANEOUS | Status: AC
  Administered 2017-05-16: 1 via TOPICAL
  Filled 2017-05-16: qty 22

## 2017-05-16 MED ORDER — SODIUM CHLORIDE 0.9 % IR SOLN
80.0000 mg | Status: AC
  Administered 2017-05-16: 80 mg

## 2017-05-16 MED ORDER — VANCOMYCIN HCL IN DEXTROSE 1-5 GM/200ML-% IV SOLN
INTRAVENOUS | Status: AC
  Filled 2017-05-16: qty 200

## 2017-05-16 MED ORDER — CHLORHEXIDINE GLUCONATE 4 % EX LIQD
60.0000 mL | Freq: Once | CUTANEOUS | Status: DC
  Filled 2017-05-16: qty 60

## 2017-05-16 MED ORDER — ONDANSETRON HCL 4 MG/2ML IJ SOLN: 4.0000 mg | Freq: Four times a day (QID) | INTRAMUSCULAR | Status: DC | PRN

## 2017-05-16 MED ORDER — ACETAMINOPHEN 325 MG PO TABS: 325.0000 mg | ORAL_TABLET | ORAL | Status: DC | PRN

## 2017-05-16 MED ORDER — LIDOCAINE HCL (PF) 1 % IJ SOLN
INTRAMUSCULAR | Status: AC
  Filled 2017-05-16: qty 60

## 2017-05-16 MED ORDER — VANCOMYCIN HCL IN DEXTROSE 1-5 GM/200ML-% IV SOLN
1000.0000 mg | INTRAVENOUS | Status: AC
  Administered 2017-05-16: 1000 mg via INTRAVENOUS

## 2017-05-16 MED ORDER — MIDAZOLAM HCL 5 MG/5ML IJ SOLN
INTRAMUSCULAR | Status: AC
  Filled 2017-05-16: qty 5

## 2017-05-16 MED ORDER — SODIUM CHLORIDE 0.9 % IV SOLN
INTRAVENOUS | Status: DC
  Administered 2017-05-16: 12:00:00 via INTRAVENOUS

## 2017-05-16 MED ORDER — SODIUM CHLORIDE 0.9 % IR SOLN
Status: AC
  Filled 2017-05-16: qty 2

## 2017-05-16 MED ORDER — LIDOCAINE HCL (PF) 1 % IJ SOLN
INTRAMUSCULAR | Status: DC | PRN
  Administered 2017-05-16: 60 mL

## 2017-05-16 MED ORDER — MIDAZOLAM HCL 5 MG/5ML IJ SOLN
INTRAMUSCULAR | Status: DC | PRN
  Administered 2017-05-16: 1 mg via INTRAVENOUS

## 2017-05-16 SURGICAL SUPPLY — 4 items
CABLE SURGICAL S-101-97-12 (CABLE) ×1 IMPLANT
PACEMAKER ASSURITY DR-RF (Pacemaker) ×1 IMPLANT
PAD DEFIB LIFELINK (PAD) ×1 IMPLANT
TRAY PACEMAKER INSERTION (PACKS) ×1 IMPLANT

## 2017-05-16 NOTE — Interval H&P Note (Signed)
History and Physical Interval Note:  05/16/2017 1:02 PM  Holly Bolton  has presented today for surgery, with the diagnosis of ERI  The various methods of treatment have been discussed with the patient and family. After consideration of risks, benefits and other options for treatment, the patient has consented to  Procedure(s): PPM GENERATOR CHANGEOUT (N/A) as a surgical intervention .  The patient's history has been reviewed, patient examined, no change in status, stable for surgery.  I have reviewed the patient's chart and labs.  Questions were answered to the patient's satisfaction.     Fayne Mcguffee

## 2017-05-16 NOTE — Discharge Instructions (Signed)
Supplemental Discharge Instructions for  Pacemaker/Defibrillator Patients  Activity No restrictions after tomorrow. DO wear your seatbelt, even if it crosses over the pacemaker site.  WOUND CARE - Keep the wound area clean and dry.  Remove the dressing the day after you return home (usually 48 hours after the procedure). - DO NOT SUBMERGE UNDER WATER UNTIL FULLY HEALED (no tub baths, hot tubs, swimming pools, etc.).  - You  may shower or take a sponge bath after the dressing is removed. DO NOT SOAK the area and do not allow the shower to directly spray on the site. - If you have staples, these will be removed in the office in 7-14 days. - If you have tape/steri-strips on your wound, these will fall off; do not pull them off prematurely.   - No bandage is needed on the site.  DO  NOT apply any creams, oils, or ointments to the wound area. - If you notice any drainage or discharge from the wound, any swelling, excessive redness or bruising at the site, or if you develop a fever > 101? F after you are discharged home, call the office at once.  Special Instructions - You are still able to use cellular telephones.  Avoid carrying your cellular phone near your device. - When traveling through airports, show security personnel your identification card to avoid being screened in the metal detectors.  - Avoid arc welding equipment, MRI testing (magnetic resonance imaging), TENS units (transcutaneous nerve stimulators).  Call the office for questions about other devices. - Avoid electrical appliances that are in poor condition or are not properly grounded. - Microwave ovens are safe to be near or to operate.   Pacemaker Battery Change, Care After This sheet gives you information about how to care for yourself after your procedure. Your health care provider may also give you more specific instructions. If you have problems or questions, contact your health care provider. What can I expect after the  procedure? After your procedure, it is common to have:  Pain or soreness at the site where the pacemaker was inserted.  Swelling at the site where the pacemaker was inserted.  Follow these instructions at home: Incision care  Keep the incision clean and dry. ? Do not take baths, swim, or use a hot tub until your health care provider approves. ? You may shower the day after your procedure, or as directed by your health care provider. ? Pat the area dry with a clean towel. Do not rub the area. This may cause bleeding.  Follow instructions from your health care provider about how to take care of your incision. Make sure you: ? Wash your hands with soap and water before you change your bandage (dressing). If soap and water are not available, use hand sanitizer. ? Change your dressing as told by your health care provider. ? Leave stitches (sutures), skin glue, or adhesive strips in place. These skin closures may need to stay in place for 2 weeks or longer. If adhesive strip edges start to loosen and curl up, you may trim the loose edges. Do not remove adhesive strips completely unless your health care provider tells you to do that.  Check your incision area every day for signs of infection. Check for: ? More redness, swelling, or pain. ? More fluid or blood. ? Warmth. ? Pus or a bad smell. Activity  Do not lift anything that is heavier than 10 lb (4.5 kg) until your health care provider says it  is okay to do so.  For the first 2 weeks, or as long as told by your health care provider: ? Avoid lifting your left arm higher than your shoulder. ? Be gentle when you move your arms over your head. It is okay to raise your arm to comb your hair. ? Avoid strenuous exercise.  Ask your health care provider when it is okay to: ? Resume your normal activities. ? Return to work or school. ? Resume sexual activity. Eating and drinking  Eat a heart-healthy diet. This should include plenty of fresh  fruits and vegetables, whole grains, low-fat dairy products, and lean protein like chicken and fish.  Limit alcohol intake to no more than 1 drink a day for non-pregnant women and 2 drinks a day for men. One drink equals 12 oz of beer, 5 oz of wine, or 1 oz of hard liquor.  Check ingredients and nutrition facts on packaged foods and beverages. Avoid the following types of food: ? Food that is high in salt (sodium). ? Food that is high in saturated fat, like full-fat dairy or red meat. ? Food that is high in trans fat, like fried food. ? Food and drinks that are high in sugar. Lifestyle  Do not use any products that contain nicotine or tobacco, such as cigarettes and e-cigarettes. If you need help quitting, ask your health care provider.  Take steps to manage and control your weight.  Get regular exercise. Aim for 150 minutes of moderate-intensity exercise (such as walking or yoga) or 75 minutes of vigorous exercise (such as running or swimming) each week.  Manage other health problems, such as diabetes or high blood pressure. Ask your health care provider how you can manage these conditions. General instructions  Do not drive for 24 hours after your procedure if you were given a medicine to help you relax (sedative).  Take over-the-counter and prescription medicines only as told by your health care provider.  Avoid putting pressure on the area where the pacemaker was placed.  If you need an MRI after your pacemaker has been placed, be sure to tell the health care provider who orders the MRI that you have a pacemaker.  Avoid close and prolonged exposure to electrical devices that have strong magnetic fields. These include: ? Cell phones. Avoid keeping them in a pocket near the pacemaker, and try using the ear opposite the pacemaker. ? MP3 players. ? Household appliances, like microwaves. ? Metal detectors. ? Electric generators. ? High-tension wires.  Keep all follow-up visits as  directed by your health care provider. This is important. Contact a health care provider if:  You have pain at the incision site that is not relieved by over-the-counter or prescription medicines.  You have any of these around your incision site or coming from it: ? More redness, swelling, or pain. ? Fluid or blood. ? Warmth to the touch. ? Pus or a bad smell.  You have a fever.  You feel brief, occasional palpitations, light-headedness, or any symptoms that you think might be related to your heart. Get help right away if:  You experience chest pain that is different from the pain at the pacemaker site.  You develop a red streak that extends above or below the incision site.  You experience shortness of breath.  You have palpitations or an irregular heartbeat.  You have light-headedness that does not go away quickly.  You faint or have dizzy spells.  Your pulse suddenly drops or increases  rapidly and does not return to normal.  You begin to gain weight and your legs and ankles swell. Summary  After your procedure, it is common to have pain, soreness, and some swelling where the pacemaker was inserted.  Make sure to keep your incision clean and dry. Follow instructions from your health care provider about how to take care of your incision.  Check your incision every day for signs of infection, such as more pain or swelling, pus or a bad smell, warmth, or leaking fluid and blood.  Avoid strenuous exercise and lifting your left arm higher than your shoulder for 2 weeks, or as long as told by your health care provider. This information is not intended to replace advice given to you by your health care provider. Make sure you discuss any questions you have with your health care provider. Document Released: 11/20/2012 Document Revised: 12/23/2015 Document Reviewed: 12/23/2015 Elsevier Interactive Patient Education  2017 [employer].

## 2017-05-16 NOTE — Op Note (Addendum)
Procedure report  Procedure performed:  1. Dual chamber pacemaker generator changeout  2. Light sedation  Reason for procedure:  1. Device generator at elective replacement interval  Procedure performed by:  Sanda Klein, MD  Complications:  None  Estimated blood loss:  <5 mL  Medications administered during procedure:  Vancomycin 1 g intravenously, lidocaine 1% 30 mL locally,Versed 1 mg intravenously During this procedure the patient is administered a total of Versed 1 mg  to achieve and maintain moderate conscious sedation.  The patient's heart rate, blood pressure, and oxygen saturation are monitored continuously during the procedure. The period of conscious sedation is 32 minutes, of which I was present face-to-face 100% of this time.  Device details:   Theatre stage manager number St. Jude Assurity, model number [id number], serial number [id number] Right atrial lead (chronic) St. Jude [id number], serial number [id number] (implanted 08/26/2001) Right ventricular lead (chronic)  St.Jude 1336T-52, serial number [id number] (implanted 08/26/2001)  Explanted generator St. Jude Accent DR 2110, serial number  [id number] (implanted 11/18/2008)  Procedure details:  After the risks and benefits of the procedure were discussed the patient provided informed consent. She was brought to the cardiac catheter lab in the fasting state. The patient was prepped and draped in usual sterile fashion. Local anesthesia with 1% lidocaine was administered to to the left infraclavicular area. A 5-6cm horizontal incision was made parallel with and 2-3 cm caudal to the right clavicle, in the area of an old scar. Using minimal  sharp and blunt dissection the prepectoral pocket was opened carefully to avoid injury to the loops of chronic leads. The skin was extremely thin. Extensive dissection was not necessary. The device was explanted. The pocket was carefully inspected for hemostasis and flushed with copious amounts of antibiotic  solution.  The leads were disconnected from the old generator and testing of the lead parameters later showed excellent values. The new generator was connected to the chronic leads, with appropriate pacing noted.   The entire system was then carefully inserted in the pocket with care been taking that the leads and device assumed a comfortable position without pressure on the incision. Great care was taken that the leads be located deep to the generator. The pocket was then closed in layers using 2 layers of 2-0 Vicryl and 3-0 Vicryl after which SteriStrips and a sterile dressing was applied.   At the end of the procedure the following lead parameters were encountered:   Right atrial lead sensed P waves 1.8 mV, impedance 402 ohms, threshold 0.7 at 0.5 ms pulse width.  Right ventricular lead sensed R waves  3.1 mV, impedance 474 ohms, threshold 0.8 at 0.5 ms pulse width.  Sanda Klein, MD, [facility] [phone] office [phone] pager

## 2017-05-17 ENCOUNTER — Telehealth: Payer: Self-pay | Admitting: Cardiovascular Disease

## 2017-05-17 ENCOUNTER — Encounter (HOSPITAL_COMMUNITY): Payer: Self-pay | Admitting: Cardiovascular Disease

## 2017-05-17 DIAGNOSIS — Z45010 Encounter for checking and testing of cardiac pacemaker pulse generator [battery]: Secondary | ICD-10-CM

## 2017-05-17 NOTE — Telephone Encounter (Signed)
Follow Up:    Pt had her battery replaced in her pacemaker yesterday.Son said they did not get any follow up instructions.

## 2017-05-17 NOTE — Telephone Encounter (Signed)
Spoke with pt and pts son informed them that they could removed the clear tegaderm dressing tomorrow and keep the incision site dry for 7 days and after that she could shower with soap and water avoid lotions oils or ointments on site. Also explained to pt and son that if site starts draining or bleeding to call and also call for fever or chills, pts son voiced understanding. Informed pts son that someone would be calling to make a wound check apt.

## 2017-05-22 DIAGNOSIS — E0590 Thyrotoxicosis, unspecified without thyrotoxic crisis or storm: Secondary | ICD-10-CM | POA: Diagnosis not present

## 2017-05-22 DIAGNOSIS — E538 Deficiency of other specified B group vitamins: Secondary | ICD-10-CM | POA: Diagnosis not present

## 2017-05-22 DIAGNOSIS — I10 Essential (primary) hypertension: Secondary | ICD-10-CM | POA: Diagnosis not present

## 2017-05-25 ENCOUNTER — Ambulatory Visit (INDEPENDENT_AMBULATORY_CARE_PROVIDER_SITE_OTHER): Payer: Medicare Other | Admitting: *Deleted

## 2017-05-25 ENCOUNTER — Encounter: Payer: Self-pay | Admitting: Cardiovascular Disease

## 2017-05-25 DIAGNOSIS — I495 Sick sinus syndrome: Secondary | ICD-10-CM | POA: Diagnosis not present

## 2017-05-25 NOTE — Progress Notes (Signed)
Wound check appointment. Steri-strips removed. Wound without redness or edema. Incision edges approximated, wound well healed. Normal device function. Thresholds, sensing, and impedances consistent with implant measurements. Device programmed at chronic values s/p gen change. Histogram distribution appropriate for patient and level of activity. No mode switches or high ventricular rates noted. ROV with [place] 7/10

## 2017-07-05 DIAGNOSIS — R58 Hemorrhage, not elsewhere classified: Secondary | ICD-10-CM | POA: Diagnosis not present

## 2017-07-05 DIAGNOSIS — W19XXXA Unspecified fall, initial encounter: Secondary | ICD-10-CM | POA: Diagnosis not present

## 2017-07-26 DIAGNOSIS — H401131 Primary open-angle glaucoma, bilateral, mild stage: Secondary | ICD-10-CM | POA: Diagnosis not present

## 2017-07-26 DIAGNOSIS — H524 Presbyopia: Secondary | ICD-10-CM | POA: Diagnosis not present

## 2017-08-10 ENCOUNTER — Telehealth: Payer: Self-pay | Admitting: Cardiovascular Disease

## 2017-08-10 MED ORDER — FUROSEMIDE 20 MG PO TABS: 20.0000 mg | ORAL_TABLET | ORAL | 3 refills | Status: DC

## 2017-08-10 NOTE — Telephone Encounter (Signed)
New message    *STAT* If patient is at the pharmacy, call can be transferred to refill team.   1. Which medications need to be refilled? (please list name of each medication and dose if known) furosemide (LASIX) 20 MG tablet  2. Which pharmacy/location (including street and city if local pharmacy) is medication to be sent to? [place], [place], [place] 3. Do they need a 30 day or 90 day supply? [place]

## 2017-08-10 NOTE — Telephone Encounter (Signed)
Rx(s) sent to pharmacy electronically.  

## 2017-08-13 DIAGNOSIS — R202 Paresthesia of skin: Secondary | ICD-10-CM | POA: Diagnosis not present

## 2017-08-13 DIAGNOSIS — W19XXXA Unspecified fall, initial encounter: Secondary | ICD-10-CM | POA: Diagnosis not present

## 2017-08-13 DIAGNOSIS — I959 Hypotension, unspecified: Secondary | ICD-10-CM | POA: Diagnosis not present

## 2017-08-13 DIAGNOSIS — R52 Pain, unspecified: Secondary | ICD-10-CM | POA: Diagnosis not present

## 2017-08-13 DIAGNOSIS — R609 Edema, unspecified: Secondary | ICD-10-CM | POA: Diagnosis not present

## 2017-08-14 ENCOUNTER — Emergency Department (HOSPITAL_COMMUNITY): Payer: Medicare Other

## 2017-08-14 ENCOUNTER — Emergency Department (HOSPITAL_COMMUNITY)
Admission: EM | Admit: 2017-08-14 | Discharge: 2017-08-14 | Disposition: A | Discharge status: Home / Self Care | Payer: Medicare Other | Source: Home / Self Care | Attending: Emergency Medicine | Admitting: Emergency Medicine

## 2017-08-14 ENCOUNTER — Other Ambulatory Visit: Payer: Self-pay

## 2017-08-14 ENCOUNTER — Encounter (HOSPITAL_COMMUNITY): Payer: Self-pay

## 2017-08-14 DIAGNOSIS — S42352B Displaced comminuted fracture of shaft of humerus, left arm, initial encounter for open fracture: Secondary | ICD-10-CM | POA: Diagnosis not present

## 2017-08-14 DIAGNOSIS — W19XXXA Unspecified fall, initial encounter: Secondary | ICD-10-CM

## 2017-08-14 DIAGNOSIS — W010XXA Fall on same level from slipping, tripping and stumbling without subsequent striking against object, initial encounter: Secondary | ICD-10-CM | POA: Insufficient documentation

## 2017-08-14 DIAGNOSIS — I119 Hypertensive heart disease without heart failure: Secondary | ICD-10-CM

## 2017-08-14 DIAGNOSIS — S42322B Displaced transverse fracture of shaft of humerus, left arm, initial encounter for open fracture: Secondary | ICD-10-CM | POA: Diagnosis not present

## 2017-08-14 DIAGNOSIS — Z23 Encounter for immunization: Secondary | ICD-10-CM

## 2017-08-14 DIAGNOSIS — Z7982 Long term (current) use of aspirin: Secondary | ICD-10-CM

## 2017-08-14 DIAGNOSIS — J90 Pleural effusion, not elsewhere classified: Secondary | ICD-10-CM | POA: Diagnosis not present

## 2017-08-14 DIAGNOSIS — Z950 Presence of cardiac pacemaker: Secondary | ICD-10-CM | POA: Insufficient documentation

## 2017-08-14 DIAGNOSIS — Y999 Unspecified external cause status: Secondary | ICD-10-CM

## 2017-08-14 DIAGNOSIS — Y929 Unspecified place or not applicable: Secondary | ICD-10-CM

## 2017-08-14 DIAGNOSIS — S42352A Displaced comminuted fracture of shaft of humerus, left arm, initial encounter for closed fracture: Secondary | ICD-10-CM | POA: Diagnosis not present

## 2017-08-14 DIAGNOSIS — Y9389 Activity, other specified: Secondary | ICD-10-CM | POA: Insufficient documentation

## 2017-08-14 DIAGNOSIS — J45909 Unspecified asthma, uncomplicated: Secondary | ICD-10-CM | POA: Insufficient documentation

## 2017-08-14 DIAGNOSIS — S41112A Laceration without foreign body of left upper arm, initial encounter: Secondary | ICD-10-CM

## 2017-08-14 DIAGNOSIS — I10 Essential (primary) hypertension: Secondary | ICD-10-CM | POA: Diagnosis not present

## 2017-08-14 DIAGNOSIS — S42392A Other fracture of shaft of left humerus, initial encounter for closed fracture: Secondary | ICD-10-CM | POA: Diagnosis not present

## 2017-08-14 DIAGNOSIS — S3993XA Unspecified injury of pelvis, initial encounter: Secondary | ICD-10-CM | POA: Diagnosis not present

## 2017-08-14 DIAGNOSIS — I452 Bifascicular block: Secondary | ICD-10-CM | POA: Diagnosis not present

## 2017-08-14 DIAGNOSIS — N179 Acute kidney failure, unspecified: Secondary | ICD-10-CM | POA: Diagnosis not present

## 2017-08-14 DIAGNOSIS — I130 Hypertensive heart and chronic kidney disease with heart failure and stage 1 through stage 4 chronic kidney disease, or unspecified chronic kidney disease: Secondary | ICD-10-CM | POA: Diagnosis not present

## 2017-08-14 DIAGNOSIS — I4510 Unspecified right bundle-branch block: Secondary | ICD-10-CM | POA: Diagnosis not present

## 2017-08-14 DIAGNOSIS — Z681 Body mass index (BMI) 19 or less, adult: Secondary | ICD-10-CM | POA: Diagnosis not present

## 2017-08-14 DIAGNOSIS — M25552 Pain in left hip: Secondary | ICD-10-CM | POA: Diagnosis not present

## 2017-08-14 DIAGNOSIS — S42322A Displaced transverse fracture of shaft of humerus, left arm, initial encounter for closed fracture: Secondary | ICD-10-CM | POA: Diagnosis not present

## 2017-08-14 DIAGNOSIS — S42302B Unspecified fracture of shaft of humerus, left arm, initial encounter for open fracture: Secondary | ICD-10-CM | POA: Diagnosis not present

## 2017-08-14 LAB — URINALYSIS, ROUTINE W REFLEX MICROSCOPIC
Bilirubin Urine: NEGATIVE
Glucose, UA: NEGATIVE mg/dL
Hgb urine dipstick: NEGATIVE
Ketones, ur: NEGATIVE mg/dL
Leukocytes, UA: NEGATIVE
Nitrite: NEGATIVE
Protein, ur: NEGATIVE mg/dL
Specific Gravity, Urine: 1.009 (ref 1.005–1.030)
pH: 5 (ref 5.0–8.0)

## 2017-08-14 LAB — BASIC METABOLIC PANEL
Anion gap: 8 (ref 5–15)
BUN: 24 mg/dL — ABNORMAL HIGH (ref 8–23)
CO2: 24 mmol/L (ref 22–32)
Calcium: 8.8 mg/dL — ABNORMAL LOW (ref 8.9–10.3)
Chloride: 107 mmol/L (ref 98–111)
Creatinine, Ser: 1.66 mg/dL — ABNORMAL HIGH (ref 0.44–1.00)
GFR calc Af Amer: 30 mL/min — ABNORMAL LOW (ref >60)
GFR calc non Af Amer: 26 mL/min — ABNORMAL LOW (ref >60)
Glucose, Bld: 171 mg/dL — ABNORMAL HIGH (ref 70–99)
Potassium: 4.3 mmol/L (ref 3.5–5.1)
Sodium: 139 mmol/L (ref 135–145)

## 2017-08-14 LAB — CBC WITH DIFFERENTIAL/PLATELET
Abs Immature Granulocytes: 0.1 10*3/uL (ref 0.0–0.1)
Basophils Absolute: 0 10*3/uL (ref 0.0–0.1)
Basophils Relative: 0 %
Eosinophils Absolute: 0 10*3/uL (ref 0.0–0.7)
Eosinophils Relative: 0 %
HCT: 31.6 % — ABNORMAL LOW (ref 36.0–46.0)
Hemoglobin: 9.9 g/dL — ABNORMAL LOW (ref 12.0–15.0)
Immature Granulocytes: 1 %
Lymphocytes Relative: 5 %
Lymphs Abs: 0.7 10*3/uL (ref 0.7–4.0)
MCH: 31.4 pg (ref 26.0–34.0)
MCHC: 31.3 g/dL (ref 30.0–36.0)
MCV: 100.3 fL — ABNORMAL HIGH (ref 78.0–100.0)
Monocytes Absolute: 1.3 10*3/uL — ABNORMAL HIGH (ref 0.1–1.0)
Monocytes Relative: 9 %
Neutro Abs: 12.3 10*3/uL — ABNORMAL HIGH (ref 1.7–7.7)
Neutrophils Relative %: 85 %
Platelets: 228 10*3/uL (ref 150–400)
RBC: 3.15 MIL/uL — ABNORMAL LOW (ref 3.87–5.11)
RDW: 16.8 % — ABNORMAL HIGH (ref 11.5–15.5)
WBC: 14.3 10*3/uL — ABNORMAL HIGH (ref 4.0–10.5)

## 2017-08-14 MED ORDER — TETANUS-DIPHTH-ACELL PERTUSSIS 5-2.5-18.5 LF-MCG/0.5 IM SUSP
0.5000 mL | Freq: Once | INTRAMUSCULAR | Status: AC
  Administered 2017-08-14: 0.5 mL via INTRAMUSCULAR
  Filled 2017-08-14: qty 0.5

## 2017-08-14 MED ORDER — HYDROCODONE-ACETAMINOPHEN 5-325 MG PO TABS: 1.0000 | ORAL_TABLET | Freq: Four times a day (QID) | ORAL | 0 refills | Status: DC | PRN

## 2017-08-14 MED ORDER — ONDANSETRON HCL 4 MG/2ML IJ SOLN
4.0000 mg | Freq: Once | INTRAMUSCULAR | Status: AC
  Administered 2017-08-14: 4 mg via INTRAVENOUS
  Filled 2017-08-14: qty 2

## 2017-08-14 MED ORDER — FENTANYL CITRATE (PF) 100 MCG/2ML IJ SOLN
50.0000 ug | Freq: Once | INTRAMUSCULAR | Status: AC
  Administered 2017-08-14: 50 ug via INTRAVENOUS
  Filled 2017-08-14: qty 2

## 2017-08-14 NOTE — ED Provider Notes (Signed)
[place] EMERGENCY DEPARTMENT Provider Note   CSN: [id number] Arrival date & time: 08/14/17  0001     History   Chief Complaint Chief Complaint  Patient presents with  . Fall  . Arm Injury    HPI Holly Bolton is a 84 y.o. female.  HPI  This is a 84 year old female with a history of hypertension, hyperlipidemia, pacemaker, ascending aortic aneurysm status post repair who presents following a fall.  Patient reports that she had a mechanical fall at home around 8 PM.  She was carrying a bag of leaves when "my legs gave out."  She fell onto her left side.  She was found by her son at 11:30 PM.  She is complaining of left arm pain.  Denies numbness or tingling in the hand.  She is right-handed.  She states that she was unable to get up on her own.  Son and patient report fairly frequent falls.  Son states that at baseline she is fairly unsteady on her feet.  She denies any chest pain or shortness of breath prior to falling.  She denies syncope.  She is not on any blood thinners.  She denies hitting her head or loss of consciousness.  Unknown last tetanus shot.  She rates her pain at 10 out of 10.  She was given something by EMS which did seem to help.  Review of chart reveals she was given 150 mcg of fentanyl.  Past Medical History:  Diagnosis Date  . Aneurysm (arteriovenous) of coronary vessels    ascending aorta requiring bypass of the LAD  . Asthma   . Carotid stenosis 04/28/08   Doppler: <40% stenosis bilateral  . Hyperlipemia   . Hypertension   . Pacemaker generator end of life 11/18/08   Intermittent high-grade atrioventricular block  . Peripheral arterial disease (HCC)    left ABI of 0.78  . Presence of permanent cardiac pacemaker 08/26/01   Sinus node dysfunction-St.Jude  . Status post ascending aortic aneurysm repair/AVR -  Medtronic Freestyle root 09/28/2014   ascending aorta requiring bypass of the LAD     Patient Active Problem List   Diagnosis Date  Noted  . Pacemaker battery depletion 05/17/2017  . Leg cramps 06/12/2016  . Peripheral arterial disease ([place]) 12/08/2014  . Status post ascending aortic aneurysm repair/AVR -  Medtronic Freestyle root 09/28/2014  . CAD s/p CABG (LIMA to LAD) 07/08/2013  . History of aortic root repairand bioprosthetic AVR 07/08/2013  . Hyperlipidemia 07/08/2013  . SSS (sick sinus syndrome) ([place]) 07/08/2013  . Second degree AV block 07/08/2013  . Pacemaker, dual chamber St. Jude 2010 07/08/2013  . Essential hypertension 07/08/2013    Past Surgical History:  Procedure Laterality Date  . ABDOMINAL HYSTERECTOMY  1966  . APPENDECTOMY  1943  . ASCENDING AORTIC ANEURYSM REPAIR  09/22/97   porcine aortic root  . BREAST FIBROADENOMA SURGERY  11/89  . CARDIAC SURGERY    . CORONARY ARTERY BYPASS GRAFT  09/22/97   LIMA to the LAD  . [place]   left  . NEPHRECTOMY  1958   right  . OVARIAN CYST SURGERY  1948  . PACEMAKER GENERATOR CHANGE  11/18/08   St.Jude  . PACEMAKER INSERTION  08/26/01   St.Jude  . PPM GENERATOR CHANGEOUT N/A 05/16/2017   Procedure: PPM GENERATOR CHANGEOUT;  Surgeon: Sanda Klein, MD;  Location: [place] CV LAB;  Service: Cardiovascular;  Laterality: N/A;     OB History  None      Home Medications    Prior to Admission medications   Medication Sig Start Date End Date Taking? Authorizing Provider  aspirin 81 MG tablet Take 81 mg by mouth 4 (four) times a week.     [provider]  brimonidine (ALPHAGAN) 0.2 % ophthalmic solution instill ONE DROP IN [facility] EYE TWICE DAILY 03/31/16   [provider]  carvedilol (COREG) 3.125 MG tablet Take 3.125 mg by mouth 2 (two) times daily with a meal. 06/05/16   [provider]  Coenzyme Q10 (COQ10) 200 MG CAPS Take 200 mg by mouth daily.    [provider]  furosemide (LASIX) 20 MG tablet Take 1 tablet (20 mg total) by mouth 2 (two) times a week. Mondays and Fridays 08/13/17   Croitoru,  Mihai, MD  HYDROcodone-acetaminophen (NORCO/VICODIN) 5-325 MG tablet Take 1 tablet by mouth every 6 (six) hours as needed. 08/14/17   Horton, Barbette Hair, MD  Magnesium 250 MG TABS Take 1 tablet (250 mg total) by mouth 2 (two) times daily. 12/15/15   Croitoru, Mihai, MD  olmesartan (BENICAR) 40 MG tablet Take 40 mg by mouth daily.    [provider]  trolamine salicylate (ASPERCREME) 10 % cream Apply 1 application topically as needed for muscle pain.    [provider]  vitamin B-12 (CYANOCOBALAMIN) 1000 MCG tablet Take 1,000 mcg by mouth daily.    [provider]    Family History Family History  Problem Relation Age of Onset  . Heart attack Mother   . Heart attack Father   . Heart attack Brother   . Hyperlipidemia Sister   . Hypertension Sister     Social History Social History   Tobacco Use  . Smoking status: Never Smoker  . Smokeless tobacco: Never Used  Substance Use Topics  . Alcohol use: No  . Drug use: No     Allergies   Accupril [quinapril hcl]; Amlodipine; Benadryl [diphenhydramine hcl]; Biaxin [clarithromycin]; Ciprofloxacin; Codeine; Diovan [valsartan]; Medrol [methylprednisolone]; Morphine and related; Neurontin [gabapentin]; and Penicillins   Review of Systems Review of Systems  Constitutional: Negative for fever.  Respiratory: Negative for shortness of breath.   Cardiovascular: Negative for chest pain.  Gastrointestinal: Negative for abdominal pain.  Musculoskeletal:       Left arm pain  Neurological: Negative for dizziness and syncope.  Psychiatric/Behavioral: Negative for confusion.  All other systems reviewed and are negative.    Physical Exam Updated Vital Signs BP (!) 163/91   Pulse 76   Temp (!) 97.5 F (36.4 C) (Axillary)   Resp 18   SpO2 100%   Physical Exam  Constitutional: She is oriented to person, place, and time.  Elderly but nontoxic-appearing  HENT:  Head: Normocephalic and atraumatic.  Eyes: Pupils are  equal, round, and reactive to light.  Neck: Normal range of motion. Neck supple.  No midline C-spine tenderness to palpation  Cardiovascular: Normal rate and regular rhythm.  Murmur heard. Pacemaker palpated right upper chest  Pulmonary/Chest: Effort normal. No respiratory distress. She has no wheezes.  Abdominal: Soft. Bowel sounds are normal. There is no tenderness.  Musculoskeletal:  Obvious deformity of left humerus, skin tear noted with overlying hematoma, no obvious open wound, 2+ radial pulse, no forearm or wrist tenderness or deformity noted, pain with range of motion of the left hip, 2+ DP pulse  Neurological: She is alert and oriented to person, place, and time.  Skin: Skin is warm and dry.  Small  skin tear left proximal arm  Psychiatric: She has a normal mood and affect.  Nursing note and vitals reviewed.    ED Treatments / Results  Labs (all labs ordered are listed, but only abnormal results are displayed) Labs Reviewed  CBC WITH DIFFERENTIAL/PLATELET - Abnormal; Notable for the following components:      Result Value   WBC 14.3 (*)    RBC 3.15 (*)    Hemoglobin 9.9 (*)    HCT 31.6 (*)    MCV 100.3 (*)    RDW 16.8 (*)    Neutro Abs 12.3 (*)    Monocytes Absolute 1.3 (*)    All other components within normal limits  BASIC METABOLIC PANEL - Abnormal; Notable for the following components:   Glucose, Bld 171 (*)    BUN 24 (*)    Creatinine, Ser 1.66 (*)    Calcium 8.8 (*)    GFR calc non Af Amer 26 (*)    GFR calc Af Amer 30 (*)    All other components within normal limits  URINALYSIS, ROUTINE W REFLEX MICROSCOPIC    EKG EKG Interpretation  Date/Time:  Tuesday August 14 2017 01:36:08 EDT Ventricular Rate:  81 PR Interval:    QRS Duration: 155 QT Interval:  456 QTC Calculation: 530 R Axis:   -77 Text Interpretation:  Paced ventricular rhythm Left bundle branch block Confirmed by Thayer Jew [phone]) on 08/14/2017 2:33:42 AM   Radiology Dg Pelvis 1-2  Views  Result Date: 08/14/2017 CLINICAL DATA:  Left hip pain after fall EXAM: PELVIS - 1-2 VIEW COMPARISON:  None. FINDINGS: There is no evidence of pelvic fracture or diastasis. No pelvic bone lesions are seen. Mild bilateral hip joint space narrowing. IMPRESSION: Negative. Electronically Signed   By: Ulyses Jarred M.D.   On: 08/14/2017 01:52   Dg Humerus Left  Result Date: 08/14/2017 CLINICAL DATA:  Fall with hematoma EXAM: LEFT HUMERUS - 2+ VIEW COMPARISON:  None. FINDINGS: Acute fracture midshaft of the humerus with 1/2 bone with of posterior and lateral displacement. About 2 cm of overriding. The left humeral head is normally position. IMPRESSION: Acute displaced and overriding fracture midshaft of the humerus Electronically Signed   By: Donavan Foil M.D.   On: 08/14/2017 01:00    Procedures Procedures (including critical care time)  Medications Ordered in ED Medications  Tdap (BOOSTRIX) injection 0.5 mL (0.5 mLs Intramuscular Given 08/14/17 0114)  fentaNYL (SUBLIMAZE) injection 50 mcg (50 mcg Intravenous Given 08/14/17 0114)  ondansetron (ZOFRAN) injection 4 mg (4 mg Intravenous Given 08/14/17 0204)     Initial Impression / Assessment and Plan / ED Course  I have reviewed the triage vital signs and the nursing notes.  Pertinent labs & imaging results that were available during my care of the patient were reviewed by me and considered in my medical decision making (see chart for details).     Patient presents following a fall.  Reports mechanical fall; however, she has multiple medical problems and reports frequent falls at home.  Will do a modified fall work-up to ensure no secondary cause.  She has an obvious deformity to left humerus.  She has overlying skin tear and hematoma.  This was wrapped.  No other obvious injury.  She does have some pain with range of motion left hip.  X-rays obtained.  She has a midshaft humerus fracture.  No hip fracture.  EKG without arrhythmia.  Basic lab  work-up is largely reassuring.  She does have an  nonspecific leukocytosis.  Urinalysis is clean.  May be related to acute trauma.  Patient was able to ambulate her baseline with a cane without any additional pain.  I discussed at length with the patient and her son that she needs to be careful when ambulating as she is a fall risk.  She may take Tylenol for pain.  If she needs something for breakthrough pain, I will give a short course of Norco.  However, she was advised that this also has Tylenol in it and she should limit Tylenol to less than 4 g/day.  Patient stated understanding.  She was placed in a sling immobilizer.  Follow-up provided with Dr. Alvan Dame.  After history, exam, and medical workup I feel the patient has been appropriately medically screened and is safe for discharge home. Pertinent diagnoses were discussed with the patient. Patient was given return precautions.   Final Clinical Impressions(s) / ED Diagnoses   Final diagnoses:  Displaced comminuted fracture of shaft of humerus, left arm, initial encounter for closed fracture  Skin tear of left upper arm without complication, initial encounter    ED Discharge Orders        Ordered    HYDROcodone-acetaminophen (NORCO/VICODIN) 5-325 MG tablet  Every 6 hours PRN     08/14/17 0310       Merryl Hacker, MD 08/14/17 [phone]

## 2017-08-14 NOTE — ED Notes (Signed)
Patient Alert and oriented to baseline. Stable and ambulatory to baseline. Patient verbalized understanding of the discharge instructions.  Patient belongings were taken by the patient.   

## 2017-08-14 NOTE — ED Triage Notes (Signed)
Patient from home after a fall around 8pm.  Son found her on the ground around 1130pm.  Tripped and fell and hurt left arm.  Obvious humerus deformity for EMS.  Splinting done by them.  150 mcg fentanyl and 8mg  zofran given for pain control.  Pulses intact.

## 2017-08-14 NOTE — Discharge Instructions (Signed)
You were seen today for left arm pain after a fall.  You have evidence of a left humerus fracture.  Maintain left splint.  Take precautions to prevent further falls.  You may take Tylenol as needed for pain.  If you need additional pain management, you will be given a short course of Norco.  Norco has Tylenol in it.  Do not exceed more than 4 g of Tylenol per day.

## 2017-08-15 ENCOUNTER — Inpatient Hospital Stay (HOSPITAL_COMMUNITY): Payer: Medicare Other | Admitting: Certified Registered Nurse Anesthetist

## 2017-08-15 ENCOUNTER — Encounter (HOSPITAL_COMMUNITY): Payer: Self-pay

## 2017-08-15 ENCOUNTER — Encounter (HOSPITAL_COMMUNITY)
Admission: EM | Disposition: A | Discharge status: Rehabilitation Facility | Payer: Self-pay | Source: Home / Self Care | Attending: Family Medicine

## 2017-08-15 ENCOUNTER — Inpatient Hospital Stay (HOSPITAL_COMMUNITY): Payer: Medicare Other

## 2017-08-15 ENCOUNTER — Emergency Department (HOSPITAL_COMMUNITY): Payer: Medicare Other

## 2017-08-15 ENCOUNTER — Other Ambulatory Visit: Payer: Self-pay

## 2017-08-15 ENCOUNTER — Inpatient Hospital Stay (HOSPITAL_COMMUNITY)
Admission: EM | Admit: 2017-08-15 | Discharge: 2017-08-20 | DRG: 493 | Disposition: A | Discharge status: Rehabilitation Facility | Payer: Medicare Other | Attending: Family Medicine | Admitting: Family Medicine

## 2017-08-15 DIAGNOSIS — Z953 Presence of xenogenic heart valve: Secondary | ICD-10-CM | POA: Diagnosis not present

## 2017-08-15 DIAGNOSIS — I2510 Atherosclerotic heart disease of native coronary artery without angina pectoris: Secondary | ICD-10-CM | POA: Diagnosis present

## 2017-08-15 DIAGNOSIS — S42352B Displaced comminuted fracture of shaft of humerus, left arm, initial encounter for open fracture: Secondary | ICD-10-CM | POA: Diagnosis not present

## 2017-08-15 DIAGNOSIS — J45909 Unspecified asthma, uncomplicated: Secondary | ICD-10-CM

## 2017-08-15 DIAGNOSIS — R778 Other specified abnormalities of plasma proteins: Secondary | ICD-10-CM

## 2017-08-15 DIAGNOSIS — R748 Abnormal levels of other serum enzymes: Secondary | ICD-10-CM | POA: Diagnosis not present

## 2017-08-15 DIAGNOSIS — Z950 Presence of cardiac pacemaker: Secondary | ICD-10-CM | POA: Diagnosis present

## 2017-08-15 DIAGNOSIS — D638 Anemia in other chronic diseases classified elsewhere: Secondary | ICD-10-CM | POA: Diagnosis present

## 2017-08-15 DIAGNOSIS — S42209A Unspecified fracture of upper end of unspecified humerus, initial encounter for closed fracture: Secondary | ICD-10-CM | POA: Diagnosis present

## 2017-08-15 DIAGNOSIS — S42322A Displaced transverse fracture of shaft of humerus, left arm, initial encounter for closed fracture: Secondary | ICD-10-CM | POA: Diagnosis present

## 2017-08-15 DIAGNOSIS — Z23 Encounter for immunization: Secondary | ICD-10-CM

## 2017-08-15 DIAGNOSIS — S42302B Unspecified fracture of shaft of humerus, left arm, initial encounter for open fracture: Principal | ICD-10-CM | POA: Diagnosis present

## 2017-08-15 DIAGNOSIS — I361 Nonrheumatic tricuspid (valve) insufficiency: Secondary | ICD-10-CM | POA: Diagnosis not present

## 2017-08-15 DIAGNOSIS — Z681 Body mass index (BMI) 19 or less, adult: Secondary | ICD-10-CM

## 2017-08-15 DIAGNOSIS — Z7401 Bed confinement status: Secondary | ICD-10-CM | POA: Diagnosis not present

## 2017-08-15 DIAGNOSIS — I130 Hypertensive heart and chronic kidney disease with heart failure and stage 1 through stage 4 chronic kidney disease, or unspecified chronic kidney disease: Secondary | ICD-10-CM | POA: Diagnosis present

## 2017-08-15 DIAGNOSIS — N179 Acute kidney failure, unspecified: Secondary | ICD-10-CM | POA: Diagnosis present

## 2017-08-15 DIAGNOSIS — W1830XA Fall on same level, unspecified, initial encounter: Secondary | ICD-10-CM | POA: Diagnosis not present

## 2017-08-15 DIAGNOSIS — S42322B Displaced transverse fracture of shaft of humerus, left arm, initial encounter for open fracture: Secondary | ICD-10-CM | POA: Diagnosis not present

## 2017-08-15 DIAGNOSIS — Z9889 Other specified postprocedural states: Secondary | ICD-10-CM | POA: Diagnosis not present

## 2017-08-15 DIAGNOSIS — T148XXA Other injury of unspecified body region, initial encounter: Secondary | ICD-10-CM | POA: Diagnosis not present

## 2017-08-15 DIAGNOSIS — S42392D Other fracture of shaft of left humerus, subsequent encounter for fracture with routine healing: Secondary | ICD-10-CM | POA: Diagnosis not present

## 2017-08-15 DIAGNOSIS — Z419 Encounter for procedure for purposes other than remedying health state, unspecified: Secondary | ICD-10-CM

## 2017-08-15 DIAGNOSIS — I5020 Unspecified systolic (congestive) heart failure: Secondary | ICD-10-CM | POA: Diagnosis not present

## 2017-08-15 DIAGNOSIS — K5900 Constipation, unspecified: Secondary | ICD-10-CM | POA: Diagnosis not present

## 2017-08-15 DIAGNOSIS — I509 Heart failure, unspecified: Secondary | ICD-10-CM | POA: Diagnosis present

## 2017-08-15 DIAGNOSIS — D539 Nutritional anemia, unspecified: Secondary | ICD-10-CM | POA: Diagnosis not present

## 2017-08-15 DIAGNOSIS — D649 Anemia, unspecified: Secondary | ICD-10-CM | POA: Diagnosis not present

## 2017-08-15 DIAGNOSIS — I4510 Unspecified right bundle-branch block: Secondary | ICD-10-CM | POA: Diagnosis not present

## 2017-08-15 DIAGNOSIS — N183 Chronic kidney disease, stage 3 (moderate): Secondary | ICD-10-CM | POA: Diagnosis not present

## 2017-08-15 DIAGNOSIS — R636 Underweight: Secondary | ICD-10-CM | POA: Diagnosis present

## 2017-08-15 DIAGNOSIS — M25512 Pain in left shoulder: Secondary | ICD-10-CM | POA: Diagnosis not present

## 2017-08-15 DIAGNOSIS — Z5189 Encounter for other specified aftercare: Secondary | ICD-10-CM | POA: Diagnosis not present

## 2017-08-15 DIAGNOSIS — I739 Peripheral vascular disease, unspecified: Secondary | ICD-10-CM | POA: Diagnosis present

## 2017-08-15 DIAGNOSIS — Z90710 Acquired absence of both cervix and uterus: Secondary | ICD-10-CM

## 2017-08-15 DIAGNOSIS — I447 Left bundle-branch block, unspecified: Secondary | ICD-10-CM | POA: Diagnosis present

## 2017-08-15 DIAGNOSIS — Z905 Acquired absence of kidney: Secondary | ICD-10-CM

## 2017-08-15 DIAGNOSIS — I10 Essential (primary) hypertension: Secondary | ICD-10-CM | POA: Diagnosis present

## 2017-08-15 DIAGNOSIS — I495 Sick sinus syndrome: Secondary | ICD-10-CM | POA: Diagnosis present

## 2017-08-15 DIAGNOSIS — M2550 Pain in unspecified joint: Secondary | ICD-10-CM | POA: Diagnosis not present

## 2017-08-15 DIAGNOSIS — I452 Bifascicular block: Secondary | ICD-10-CM | POA: Diagnosis present

## 2017-08-15 DIAGNOSIS — J90 Pleural effusion, not elsewhere classified: Secondary | ICD-10-CM | POA: Diagnosis not present

## 2017-08-15 DIAGNOSIS — Z951 Presence of aortocoronary bypass graft: Secondary | ICD-10-CM

## 2017-08-15 DIAGNOSIS — N184 Chronic kidney disease, stage 4 (severe): Secondary | ICD-10-CM | POA: Diagnosis not present

## 2017-08-15 DIAGNOSIS — S42302D Unspecified fracture of shaft of humerus, left arm, subsequent encounter for fracture with routine healing: Secondary | ICD-10-CM | POA: Diagnosis not present

## 2017-08-15 DIAGNOSIS — Z8679 Personal history of other diseases of the circulatory system: Secondary | ICD-10-CM | POA: Diagnosis not present

## 2017-08-15 DIAGNOSIS — E785 Hyperlipidemia, unspecified: Secondary | ICD-10-CM | POA: Diagnosis present

## 2017-08-15 DIAGNOSIS — S42392B Other fracture of shaft of left humerus, initial encounter for open fracture: Secondary | ICD-10-CM

## 2017-08-15 DIAGNOSIS — S42202B Unspecified fracture of upper end of left humerus, initial encounter for open fracture: Secondary | ICD-10-CM | POA: Diagnosis not present

## 2017-08-15 DIAGNOSIS — H4010X0 Unspecified open-angle glaucoma, stage unspecified: Secondary | ICD-10-CM | POA: Diagnosis not present

## 2017-08-15 DIAGNOSIS — E569 Vitamin deficiency, unspecified: Secondary | ICD-10-CM | POA: Diagnosis not present

## 2017-08-15 DIAGNOSIS — M6281 Muscle weakness (generalized): Secondary | ICD-10-CM | POA: Diagnosis not present

## 2017-08-15 DIAGNOSIS — R7989 Other specified abnormal findings of blood chemistry: Secondary | ICD-10-CM

## 2017-08-15 HISTORY — PX: I & D EXTREMITY: SHX5045

## 2017-08-15 HISTORY — PX: ORIF HUMERUS FRACTURE: SHX2126

## 2017-08-15 LAB — APTT: aPTT: 31 seconds (ref 24–36)

## 2017-08-15 LAB — CBC WITH DIFFERENTIAL/PLATELET
Abs Immature Granulocytes: 0.1 10*3/uL (ref 0.0–0.1)
Basophils Absolute: 0 10*3/uL (ref 0.0–0.1)
Basophils Relative: 0 %
Eosinophils Absolute: 0 10*3/uL (ref 0.0–0.7)
Eosinophils Relative: 0 %
HCT: 31.3 % — ABNORMAL LOW (ref 36.0–46.0)
Hemoglobin: 9.9 g/dL — ABNORMAL LOW (ref 12.0–15.0)
Immature Granulocytes: 1 %
Lymphocytes Relative: 9 %
Lymphs Abs: 0.8 10*3/uL (ref 0.7–4.0)
MCH: 31.8 pg (ref 26.0–34.0)
MCHC: 31.6 g/dL (ref 30.0–36.0)
MCV: 100.6 fL — ABNORMAL HIGH (ref 78.0–100.0)
Monocytes Absolute: 1.1 10*3/uL — ABNORMAL HIGH (ref 0.1–1.0)
Monocytes Relative: 12 %
Neutro Abs: 6.9 10*3/uL (ref 1.7–7.7)
Neutrophils Relative %: 78 %
Platelets: 210 10*3/uL (ref 150–400)
RBC: 3.11 MIL/uL — ABNORMAL LOW (ref 3.87–5.11)
RDW: 16.7 % — ABNORMAL HIGH (ref 11.5–15.5)
WBC: 8.8 10*3/uL (ref 4.0–10.5)

## 2017-08-15 LAB — BASIC METABOLIC PANEL
Anion gap: 12 (ref 5–15)
BUN: 28 mg/dL — ABNORMAL HIGH (ref 8–23)
CO2: 24 mmol/L (ref 22–32)
Calcium: 8.8 mg/dL — ABNORMAL LOW (ref 8.9–10.3)
Chloride: 104 mmol/L (ref 98–111)
Creatinine, Ser: 1.85 mg/dL — ABNORMAL HIGH (ref 0.44–1.00)
GFR calc Af Amer: 26 mL/min — ABNORMAL LOW (ref >60)
GFR calc non Af Amer: 23 mL/min — ABNORMAL LOW (ref >60)
Glucose, Bld: 115 mg/dL — ABNORMAL HIGH (ref 70–99)
Potassium: 4.6 mmol/L (ref 3.5–5.1)
Sodium: 140 mmol/L (ref 135–145)

## 2017-08-15 LAB — ABO/RH: ABO/RH(D): B POS

## 2017-08-15 LAB — TROPONIN I: Troponin I: 0.1 ng/mL (ref <0.03)

## 2017-08-15 LAB — PROTIME-INR
INR: 1.22
Prothrombin Time: 15.3 seconds — ABNORMAL HIGH (ref 11.4–15.2)

## 2017-08-15 LAB — MRSA PCR SCREENING: MRSA by PCR: NEGATIVE

## 2017-08-15 SURGERY — OPEN REDUCTION INTERNAL FIXATION (ORIF) PROXIMAL HUMERUS FRACTURE
Anesthesia: Choice | Site: Arm Upper | Laterality: Left

## 2017-08-15 MED ORDER — CARVEDILOL 3.125 MG PO TABS
3.1250 mg | ORAL_TABLET | Freq: Two times a day (BID) | ORAL | Status: DC
  Filled 2017-08-15: qty 1

## 2017-08-15 MED ORDER — IRBESARTAN 75 MG PO TABS: 37.5000 mg | ORAL_TABLET | Freq: Every day | ORAL | Status: DC

## 2017-08-15 MED ORDER — CARVEDILOL 3.125 MG PO TABS
3.1250 mg | ORAL_TABLET | ORAL | Status: AC
  Administered 2017-08-15: 3.125 mg via ORAL

## 2017-08-15 MED ORDER — SENNOSIDES-DOCUSATE SODIUM 8.6-50 MG PO TABS: 1.0000 | ORAL_TABLET | Freq: Every evening | ORAL | Status: DC | PRN

## 2017-08-15 MED ORDER — FENTANYL CITRATE (PF) 250 MCG/5ML IJ SOLN
INTRAMUSCULAR | Status: DC | PRN
  Administered 2017-08-15 (×2): 25 ug via INTRAVENOUS

## 2017-08-15 MED ORDER — PHENYLEPHRINE 40 MCG/ML (10ML) SYRINGE FOR IV PUSH (FOR BLOOD PRESSURE SUPPORT)
PREFILLED_SYRINGE | INTRAVENOUS | Status: AC
  Filled 2017-08-15: qty 10

## 2017-08-15 MED ORDER — ROCURONIUM BROMIDE 10 MG/ML (PF) SYRINGE
PREFILLED_SYRINGE | INTRAVENOUS | Status: AC
  Filled 2017-08-15: qty 20

## 2017-08-15 MED ORDER — FLEET ENEMA 7-19 GM/118ML RE ENEM: 1.0000 | ENEMA | Freq: Once | RECTAL | Status: DC | PRN

## 2017-08-15 MED ORDER — BUPIVACAINE-EPINEPHRINE (PF) 0.25% -1:200000 IJ SOLN
INTRAMUSCULAR | Status: AC
  Filled 2017-08-15: qty 30

## 2017-08-15 MED ORDER — HYDRALAZINE HCL 20 MG/ML IJ SOLN: 5.0000 mg | Freq: Three times a day (TID) | INTRAMUSCULAR | Status: DC | PRN

## 2017-08-15 MED ORDER — SUGAMMADEX SODIUM 200 MG/2ML IV SOLN
INTRAVENOUS | Status: AC
  Filled 2017-08-15: qty 2

## 2017-08-15 MED ORDER — BISACODYL 5 MG PO TBEC
5.0000 mg | DELAYED_RELEASE_TABLET | Freq: Every day | ORAL | Status: DC | PRN
  Administered 2017-08-17 – 2017-08-19 (×2): 5 mg via ORAL
  Filled 2017-08-15 (×2): qty 1

## 2017-08-15 MED ORDER — ONDANSETRON HCL 4 MG/2ML IJ SOLN
INTRAMUSCULAR | Status: AC
  Filled 2017-08-15: qty 2

## 2017-08-15 MED ORDER — LACTATED RINGERS IV SOLN
INTRAVENOUS | Status: DC
  Administered 2017-08-15: 20:00:00 via INTRAVENOUS

## 2017-08-15 MED ORDER — SODIUM CHLORIDE 0.9 % IV SOLN
INTRAVENOUS | Status: DC
  Administered 2017-08-15: 19:00:00 via INTRAVENOUS

## 2017-08-15 MED ORDER — ROCURONIUM 10MG/ML (10ML) SYRINGE FOR MEDFUSION PUMP - OPTIME
INTRAVENOUS | Status: DC | PRN
  Administered 2017-08-15: 50 mg via INTRAVENOUS

## 2017-08-15 MED ORDER — PHENYLEPHRINE HCL 10 MG/ML IJ SOLN
INTRAMUSCULAR | Status: DC | PRN
  Administered 2017-08-15: 40 ug via INTRAVENOUS

## 2017-08-15 MED ORDER — PROPOFOL 10 MG/ML IV BOLUS
INTRAVENOUS | Status: DC | PRN
  Administered 2017-08-15: 80 mg via INTRAVENOUS

## 2017-08-15 MED ORDER — FENTANYL CITRATE (PF) 100 MCG/2ML IJ SOLN
25.0000 ug | INTRAMUSCULAR | Status: DC | PRN
  Administered 2017-08-15 (×3): 25 ug via INTRAVENOUS

## 2017-08-15 MED ORDER — POVIDONE-IODINE 10 % EX SWAB: 2.0000 "application " | Freq: Once | CUTANEOUS | Status: DC

## 2017-08-15 MED ORDER — FUROSEMIDE 20 MG PO TABS: 20.0000 mg | ORAL_TABLET | ORAL | Status: DC

## 2017-08-15 MED ORDER — MORPHINE SULFATE (PF) 4 MG/ML IV SOLN: 0.5000 mg | INTRAVENOUS | Status: DC | PRN

## 2017-08-15 MED ORDER — CHLORHEXIDINE GLUCONATE 4 % EX LIQD: 60.0000 mL | Freq: Once | CUTANEOUS | Status: DC

## 2017-08-15 MED ORDER — ONDANSETRON HCL 4 MG/2ML IJ SOLN
INTRAMUSCULAR | Status: DC | PRN
  Administered 2017-08-15: 4 mg via INTRAVENOUS

## 2017-08-15 MED ORDER — CEFAZOLIN SODIUM-DEXTROSE 2-4 GM/100ML-% IV SOLN
2.0000 g | INTRAVENOUS | Status: AC
  Administered 2017-08-15: 2 g via INTRAVENOUS
  Filled 2017-08-15 (×2): qty 100

## 2017-08-15 MED ORDER — SUGAMMADEX SODIUM 200 MG/2ML IV SOLN
INTRAVENOUS | Status: DC | PRN
  Administered 2017-08-15: 100 mg via INTRAVENOUS
  Administered 2017-08-15: 200 mg via INTRAVENOUS

## 2017-08-15 MED ORDER — SUCCINYLCHOLINE CHLORIDE 200 MG/10ML IV SOSY
PREFILLED_SYRINGE | INTRAVENOUS | Status: AC
  Filled 2017-08-15: qty 10

## 2017-08-15 MED ORDER — FENTANYL CITRATE (PF) 100 MCG/2ML IJ SOLN
INTRAMUSCULAR | Status: AC
  Administered 2017-08-15: 25 ug via INTRAVENOUS
  Filled 2017-08-15: qty 2

## 2017-08-15 MED ORDER — FENTANYL CITRATE (PF) 250 MCG/5ML IJ SOLN
INTRAMUSCULAR | Status: AC
  Filled 2017-08-15: qty 5

## 2017-08-15 MED ORDER — VITAMIN B-12 1000 MCG PO TABS
1000.0000 ug | ORAL_TABLET | Freq: Two times a day (BID) | ORAL | Status: DC
  Administered 2017-08-16 – 2017-08-20 (×8): 1000 ug via ORAL
  Filled 2017-08-15 (×10): qty 1

## 2017-08-15 MED ORDER — SODIUM CHLORIDE 0.9 % IR SOLN
Status: DC | PRN
  Administered 2017-08-15: 1000 mL

## 2017-08-15 MED ORDER — CEFAZOLIN SODIUM-DEXTROSE 2-4 GM/100ML-% IV SOLN: 2.0000 g | INTRAVENOUS | Status: DC

## 2017-08-15 MED ORDER — LIDOCAINE 2% (20 MG/ML) 5 ML SYRINGE
INTRAMUSCULAR | Status: AC
  Filled 2017-08-15: qty 15

## 2017-08-15 MED ORDER — BRIMONIDINE TARTRATE 0.2 % OP SOLN
1.0000 [drp] | Freq: Two times a day (BID) | OPHTHALMIC | Status: DC
  Administered 2017-08-16 – 2017-08-20 (×9): 1 [drp] via OPHTHALMIC
  Filled 2017-08-15 (×2): qty 5

## 2017-08-15 MED ORDER — PROPOFOL 10 MG/ML IV BOLUS
INTRAVENOUS | Status: AC
  Filled 2017-08-15: qty 20

## 2017-08-15 SURGICAL SUPPLY — 101 items
ALCOHOL 70% 16 OZ (MISCELLANEOUS) ×3 IMPLANT
BANDAGE ACE 3X5.8 VEL STRL LF (GAUZE/BANDAGES/DRESSINGS) IMPLANT
BANDAGE ACE 4X5 VEL STRL LF (GAUZE/BANDAGES/DRESSINGS) ×2 IMPLANT
BIT DRILL 2.5X110 QC LCP DISP (BIT) ×2 IMPLANT
BIT DRILL 5/64X5 DISP (BIT) ×1 IMPLANT
BLADE SURG 10 STRL SS (BLADE) ×3 IMPLANT
BNDG COHESIVE 1X5 TAN STRL LF (GAUZE/BANDAGES/DRESSINGS) IMPLANT
BNDG COHESIVE 4X5 TAN STRL (GAUZE/BANDAGES/DRESSINGS) ×3 IMPLANT
BNDG COHESIVE 6X5 TAN STRL LF (GAUZE/BANDAGES/DRESSINGS) ×2 IMPLANT
BNDG CONFORM 3 STRL LF (GAUZE/BANDAGES/DRESSINGS) IMPLANT
BNDG GAUZE STRTCH 6 (GAUZE/BANDAGES/DRESSINGS) ×3 IMPLANT
CLOSURE WOUND 1/2 X4 (GAUZE/BANDAGES/DRESSINGS)
CORDS BIPOLAR (ELECTRODE) IMPLANT
COVER SURGICAL LIGHT HANDLE (MISCELLANEOUS) ×3 IMPLANT
CUFF TOURNIQUET SINGLE 24IN (TOURNIQUET CUFF) IMPLANT
CUFF TOURNIQUET SINGLE 34IN LL (TOURNIQUET CUFF) ×2 IMPLANT
CUFF TOURNIQUET SINGLE 44IN (TOURNIQUET CUFF) IMPLANT
DRAPE EXTREMITY BILATERAL (DRAPES) IMPLANT
DRAPE IMP U-DRAPE 54X76 (DRAPES) ×2 IMPLANT
DRAPE INCISE IOBAN 66X45 STRL (DRAPES) ×6 IMPLANT
DRAPE ORTHO SPLIT 77X108 STRL (DRAPES)
DRAPE SURG 17X23 STRL (DRAPES) ×2 IMPLANT
DRAPE SURG ORHT 6 SPLT 77X108 (DRAPES) ×2 IMPLANT
DRAPE U-SHAPE 47X51 STRL (DRAPES) ×5 IMPLANT
DRSG ADAPTIC 3X8 NADH LF (GAUZE/BANDAGES/DRESSINGS) ×1 IMPLANT
DRSG PAD ABDOMINAL 8X10 ST (GAUZE/BANDAGES/DRESSINGS) ×5 IMPLANT
DURAPREP 26ML APPLICATOR (WOUND CARE) ×5 IMPLANT
ELECT BLADE 4.0 EZ CLEAN MEGAD (MISCELLANEOUS)
ELECT CAUTERY BLADE 6.4 (BLADE) ×3 IMPLANT
ELECT REM PT RETURN 9FT ADLT (ELECTROSURGICAL) ×3
ELECTRODE BLDE 4.0 EZ CLN MEGD (MISCELLANEOUS) ×1 IMPLANT
ELECTRODE REM PT RTRN 9FT ADLT (ELECTROSURGICAL) ×1 IMPLANT
FACESHIELD WRAPAROUND (MASK) IMPLANT
FACESHIELD WRAPAROUND OR TEAM (MASK) IMPLANT
GAUZE SPONGE 4X4 12PLY STRL (GAUZE/BANDAGES/DRESSINGS) ×4 IMPLANT
GAUZE XEROFORM 1X8 LF (GAUZE/BANDAGES/DRESSINGS) ×1 IMPLANT
GAUZE XEROFORM 5X9 LF (GAUZE/BANDAGES/DRESSINGS) ×3 IMPLANT
GLOVE BIO SURGEON STRL SZ7.5 (GLOVE) ×3 IMPLANT
GLOVE BIOGEL PI IND STRL 8 (GLOVE) ×1 IMPLANT
GLOVE BIOGEL PI INDICATOR 8 (GLOVE) ×2
GOWN STRL REUS W/ TWL LRG LVL3 (GOWN DISPOSABLE) ×2 IMPLANT
GOWN STRL REUS W/ TWL XL LVL3 (GOWN DISPOSABLE) ×2 IMPLANT
GOWN STRL REUS W/TWL LRG LVL3 (GOWN DISPOSABLE) ×6
GOWN STRL REUS W/TWL XL LVL3 (GOWN DISPOSABLE) ×6
HANDPIECE INTERPULSE COAX TIP (DISPOSABLE)
KIT BASIN OR (CUSTOM PROCEDURE TRAY) ×3 IMPLANT
KIT BEACH CHAIR TRIMANO (MISCELLANEOUS) IMPLANT
KIT TURNOVER KIT B (KITS) ×3 IMPLANT
MANIFOLD NEPTUNE II (INSTRUMENTS) ×3 IMPLANT
NDL 1/2 CIR MAYO (NEEDLE) ×1 IMPLANT
NDL HYPO 25GX1X1/2 BEV (NEEDLE) ×1 IMPLANT
NEEDLE 1/2 CIR MAYO (NEEDLE) IMPLANT
NEEDLE HYPO 25GX1X1/2 BEV (NEEDLE) IMPLANT
NS IRRIG 1000ML POUR BTL (IV SOLUTION) ×4 IMPLANT
PACK ORTHO EXTREMITY (CUSTOM PROCEDURE TRAY) ×3 IMPLANT
PACK SHOULDER (CUSTOM PROCEDURE TRAY) ×1 IMPLANT
PAD ABD 8X10 STRL (GAUZE/BANDAGES/DRESSINGS) ×1 IMPLANT
PAD ARMBOARD 7.5X6 YLW CONV (MISCELLANEOUS) ×10 IMPLANT
PADDING CAST ABS 4INX4YD NS (CAST SUPPLIES) ×2
PADDING CAST ABS COTTON 4X4 ST (CAST SUPPLIES) ×2 IMPLANT
PADDING CAST COTTON 6X4 STRL (CAST SUPPLIES) ×1 IMPLANT
PROS LCP PLATE 8H 111M (Plate) ×3 IMPLANT
PROSTHESIS LCP PLATE 8H 111M (Plate) IMPLANT
SCREW CORTEX 3.5 18MM (Screw) ×4 IMPLANT
SCREW CORTEX 3.5 20MM (Screw) ×6 IMPLANT
SCREW CORTEX 3.5 22MM (Screw) ×2 IMPLANT
SCREW CORTEX 3.5 26MM (Screw) ×2 IMPLANT
SCREW LOCK CORT ST 3.5X18 (Screw) IMPLANT
SCREW LOCK CORT ST 3.5X20 (Screw) IMPLANT
SCREW LOCK CORT ST 3.5X22 (Screw) IMPLANT
SCREW LOCK CORT ST 3.5X26 (Screw) IMPLANT
SET HNDPC FAN SPRY TIP SCT (DISPOSABLE) IMPLANT
SLING ARM FOAM STRAP LRG (SOFTGOODS) IMPLANT
SLING ARM FOAM STRAP MED (SOFTGOODS) IMPLANT
SPONGE LAP 18X18 X RAY DECT (DISPOSABLE) ×6 IMPLANT
SPONGE LAP 4X18 RFD (DISPOSABLE) ×1 IMPLANT
STOCKINETTE IMPERVIOUS 9X36 MD (GAUZE/BANDAGES/DRESSINGS) ×3 IMPLANT
STRIP CLOSURE SKIN 1/2X4 (GAUZE/BANDAGES/DRESSINGS) ×1 IMPLANT
SUCTION FRAZIER HANDLE 10FR (MISCELLANEOUS)
SUCTION TUBE FRAZIER 10FR DISP (MISCELLANEOUS) ×1 IMPLANT
SUT ETHILON 2 0 FS 18 (SUTURE) IMPLANT
SUT ETHILON 2 0 PSLX (SUTURE) IMPLANT
SUT ETHILON 3 0 PS 1 (SUTURE) IMPLANT
SUT FIBERWIRE #2 38 T-5 BLUE (SUTURE)
SUT MNCRL AB 4-0 PS2 18 (SUTURE) ×1 IMPLANT
SUT VIC AB 2-0 CT1 27 (SUTURE)
SUT VIC AB 2-0 CT1 36 (SUTURE) IMPLANT
SUT VIC AB 2-0 CT1 TAPERPNT 27 (SUTURE) ×1 IMPLANT
SUT VIC AB 2-0 FS1 27 (SUTURE) IMPLANT
SUTURE FIBERWR #2 38 T-5 BLUE (SUTURE) ×2 IMPLANT
SWAB CULTURE ESWAB REG 1ML (MISCELLANEOUS) IMPLANT
SYR CONTROL 10ML LL (SYRINGE) ×1 IMPLANT
TOWEL OR 17X24 6PK STRL BLUE (TOWEL DISPOSABLE) ×1 IMPLANT
TOWEL OR 17X26 10 PK STRL BLUE (TOWEL DISPOSABLE) ×3 IMPLANT
TUBE CONNECTING 12'X1/4 (SUCTIONS) ×1
TUBE CONNECTING 12X1/4 (SUCTIONS) ×2 IMPLANT
TUBE FEEDING ENTERAL 5FR 16IN (TUBING) IMPLANT
TUBING CYSTO DISP (UROLOGICAL SUPPLIES) ×1 IMPLANT
UNDERPAD 30X30 (UNDERPADS AND DIAPERS) ×4 IMPLANT
WATER STERILE IRR 1000ML POUR (IV SOLUTION) ×1 IMPLANT
YANKAUER SUCT BULB TIP NO VENT (SUCTIONS) ×3 IMPLANT

## 2017-08-15 NOTE — Anesthesia Postprocedure Evaluation (Signed)
Anesthesia Post Note  Patient: Holly Bolton  Procedure(s) Performed: OPEN REDUCTION INTERNAL FIXATION (ORIF) PROXIMAL HUMERUS FRACTURE (Left Arm Upper) IRRIGATION AND DEBRIDEMENT EXTREMITY (Left Arm Upper)     Patient location during evaluation: PACU Anesthesia Type: General Level of consciousness: awake and alert, oriented and patient cooperative Pain management: pain level controlled Vital Signs Assessment: post-procedure vital signs reviewed and stable Respiratory status: spontaneous breathing, nonlabored ventilation, respiratory function stable and patient connected to nasal cannula oxygen Cardiovascular status: blood pressure returned to baseline and stable Postop Assessment: no apparent nausea or vomiting Anesthetic complications: no    Last Vitals:  Vitals:   08/15/17 2315 08/15/17 2330  BP: (!) 171/69 (!) 168/70  Pulse: 72 71  Resp: 15 11  Temp:  (!) 36.2 C  SpO2: 98%     Last Pain:  Vitals:   08/15/17 2315  TempSrc:   PainSc: 6                  Brodyn Depuy,E. Ariya Bohannon

## 2017-08-15 NOTE — Brief Op Note (Signed)
08/15/2017  11:11 PM  PATIENT:  Holly Bolton  84 y.o. female  PRE-OPERATIVE DIAGNOSIS:  Open left humerus fx  POST-OPERATIVE DIAGNOSIS:  Open left humerus fx  PROCEDURE:  Procedure(s): OPEN REDUCTION INTERNAL FIXATION (ORIF) PROXIMAL HUMERUS FRACTURE (Left) IRRIGATION AND DEBRIDEMENT EXTREMITY (Left)  SURGEON:  Surgeon(s) and Role:    * Nicholes Stairs, MD - Primary  PHYSICIAN ASSISTANT:   ASSISTANTS: none   ANESTHESIA:   general  EBL:  30 mL   BLOOD ADMINISTERED:none  DRAINS: none   LOCAL MEDICATIONS USED:  NONE  SPECIMEN:  No Specimen  DISPOSITION OF SPECIMEN:  N/A  COUNTS:  YES  TOURNIQUET:  * No tourniquets in log *  DICTATION: .Note written in EPIC  PLAN OF CARE: Admit to inpatient   PATIENT DISPOSITION:  PACU - hemodynamically stable.   Delay start of Pharmacological VTE agent (>24hrs) due to surgical blood loss or risk of bleeding: not applicable

## 2017-08-15 NOTE — ED Provider Notes (Signed)
  Face-to-face evaluation   History: She presents for evaluation of humerus fracture with need for surgical repair.  She describes a mechanical fall, 36 hours ago, with persistent pain in her left arm since.  No other significant injuries.  She has been eating well.  She is using both hydrocodone, Motrin for pain.  Physical exam: Alert, elderly female.  Large bulky left arm splint.  Fingers left hand are sensate, with normal circulation and mobility.  Medical screening examination/treatment/procedure(s) were conducted as a shared visit with non-physician practitioner(s) and myself.  I personally evaluated the patient during the encounter    Daleen Bo, MD 08/17/17 [phone]

## 2017-08-15 NOTE — Progress Notes (Signed)
RN got a call from OR asking to call short stay to give report on pt in room 5N01. PT just arrived on the floor and we had to rush to do everything, prior to admission on the floor RN asked the ED nurse if the patient was going to surgery first because on the OR schedule it said the surgery was supposed to start at 1750. ED nurse responded No it will be later at night, pt got admitted on floor after 630 and was called for surgery before shift change at 1800. RN told short stay nurse all that had not been done for the patient and she stated okay they would do It downstairs.

## 2017-08-15 NOTE — Consult Note (Signed)
Reason for Consult:Left open humerus fx Referring Physician: E Shantrice Rodenberg is an 84 y.o. female.  HPI: Holly Bolton fell while taking some garbage out and fell on the concrete on her left side. She lay there for 3h until she was discovered. She was taken to the ED where she was diagnosed with a humerus fx and discharged with OP f/u. It was not recognized at the time that it was open. When seen by ortho this morning she was sent back to the ED for admission and I&D, ORIF. She is RHD. She lives at home with her son.  Past Medical History:  Diagnosis Date  . Aneurysm (arteriovenous) of coronary vessels    ascending aorta requiring bypass of the LAD  . Asthma   . Carotid stenosis 04/28/08   Doppler: <40% stenosis bilateral  . Hyperlipemia   . Hypertension   . Pacemaker generator end of life 11/18/08   Intermittent high-grade atrioventricular block  . Peripheral arterial disease (HCC)    left ABI of 0.78  . Presence of permanent cardiac pacemaker 08/26/01   Sinus node dysfunction-St.Jude  . Status post ascending aortic aneurysm repair/AVR -  Medtronic Freestyle root 09/28/2014   ascending aorta requiring bypass of the LAD     Past Surgical History:  Procedure Laterality Date  . ABDOMINAL HYSTERECTOMY  1966  . APPENDECTOMY  1943  . ASCENDING AORTIC ANEURYSM REPAIR  09/22/97   porcine aortic root  . BREAST FIBROADENOMA SURGERY  11/89  . CARDIAC SURGERY    . CORONARY ARTERY BYPASS GRAFT  09/22/97   LIMA to the LAD  . [place]   left  . NEPHRECTOMY  1958   right  . OVARIAN CYST SURGERY  1948  . PACEMAKER GENERATOR CHANGE  11/18/08   St.Jude  . PACEMAKER INSERTION  08/26/01   [employer].Jude  . PPM GENERATOR CHANGEOUT N/A 05/16/2017   Procedure: PPM GENERATOR CHANGEOUT;  Surgeon: Sanda Klein, MD;  Location: [facility];  Service: Cardiovascular;  Laterality: N/A;    Family History  Problem Relation Age of Onset  . Heart attack Mother   . Heart attack Father   .  Heart attack Brother   . Hyperlipidemia Sister   . Hypertension Sister     Social History:  reports that she has never smoked. She has never used smokeless tobacco. She reports that she does not drink alcohol or use drugs.  Allergies:  Allergies  Allergen Reactions  . Accupril [Quinapril Hcl]     Unknown reaction   . Amlodipine Swelling  . Benadryl [Diphenhydramine Hcl]     Unknown reaction   . Biaxin [Clarithromycin]     Unknown reaction   . Ciprofloxacin     Unknown reaction   . Codeine     Unknown reaction   . Diovan [Valsartan]     Unknown reaction   . Medrol [Methylprednisolone]   . Morphine And Related     Unknown reaction   . Neurontin [Gabapentin]     Unknown reaction   . Penicillins     Unknown reaction  Has patient had a PCN reaction causing immediate rash, facial/tongue/throat swelling, SOB or lightheadedness with hypotension: Unknown Has patient had a PCN reaction causing severe rash involving mucus membranes or skin necrosis: Unknown Has patient had a PCN reaction that required hospitalization: Unknown Has patient had a PCN reaction occurring within the last 10 years: No If all of the above answers are "NO", then may  proceed with Cephalosporin use.     Medications: I have reviewed the patient's current medications.  Results for orders placed or performed during the hospital encounter of 08/14/17 (from the past 48 hour(s))  CBC with Differential     Status: Abnormal   Collection Time: 08/14/17  1:03 AM  Result Value Ref Range   WBC 14.3 (H) 4.0 - 10.5 K/uL   RBC 3.15 (L) 3.87 - 5.11 MIL/uL   Hemoglobin 9.9 (L) 12.0 - 15.0 g/dL   HCT 31.6 (L) 36.0 - 46.0 %   MCV 100.3 (H) 78.0 - 100.0 fL   MCH 31.4 26.0 - 34.0 pg   MCHC 31.3 30.0 - 36.0 g/dL   RDW 16.8 (H) 11.5 - 15.5 %   Platelets 228 150 - 400 K/uL   Neutrophils Relative % 85 %   Neutro Abs 12.3 (H) 1.7 - 7.7 K/uL   Lymphocytes Relative 5 %   Lymphs Abs 0.7 0.7 - 4.0 K/uL   Monocytes Relative 9 %    Monocytes Absolute 1.3 (H) 0.1 - 1.0 K/uL   Eosinophils Relative 0 %   Eosinophils Absolute 0.0 0.0 - 0.7 K/uL   Basophils Relative 0 %   Basophils Absolute 0.0 0.0 - 0.1 K/uL   Immature Granulocytes 1 %   Abs Immature Granulocytes 0.1 0.0 - 0.1 K/uL    Comment: Performed at [facility], [address], [place], [place] [address]  Basic metabolic panel     Status: Abnormal   Collection Time: 08/14/17  1:03 AM  Result Value Ref Range   Sodium 139 135 - 145 mmol/L   Potassium 4.3 3.5 - 5.1 mmol/L   Chloride 107 98 - 111 mmol/L    Comment: Please note change in reference range.   CO2 24 22 - 32 mmol/L   Glucose, Bld 171 (H) 70 - 99 mg/dL    Comment: Please note change in reference range.   BUN 24 (H) 8 - 23 mg/dL    Comment: Please note change in reference range.   Creatinine, Ser 1.66 (H) 0.44 - 1.00 mg/dL   Calcium 8.8 (L) 8.9 - 10.3 mg/dL   GFR calc non Af Amer 26 (L) >60 mL/min   GFR calc Af Amer 30 (L) >60 mL/min    Comment: (NOTE) The eGFR has been calculated using the CKD EPI equation. This calculation has not been validated in all clinical situations. eGFR's persistently <60 mL/min signify possible Chronic Kidney Disease.    Anion gap 8 5 - 15    Comment: Performed at [facility] [address], [place], [place] [address]  Urinalysis, Routine w reflex microscopic     Status: None   Collection Time: 08/14/17  1:21 AM  Result Value Ref Range   Color, Urine YELLOW YELLOW   APPearance CLEAR CLEAR   Specific Gravity, Urine 1.009 1.005 - 1.030   pH 5.0 5.0 - 8.0   Glucose, UA NEGATIVE NEGATIVE mg/dL   Hgb urine dipstick NEGATIVE NEGATIVE   Bilirubin Urine NEGATIVE NEGATIVE   Ketones, ur NEGATIVE NEGATIVE mg/dL   Protein, ur NEGATIVE NEGATIVE mg/dL   Nitrite NEGATIVE NEGATIVE   Leukocytes, UA NEGATIVE NEGATIVE    Comment: Performed at [facility] [address], [place], [place] [address]    Dg Pelvis 1-2 Views  Result Date:  08/14/2017 CLINICAL DATA:  Left hip pain after fall EXAM: PELVIS - 1-2 VIEW COMPARISON:  None. FINDINGS: There is no evidence of pelvic  fracture or diastasis. No pelvic bone lesions are seen. Mild bilateral hip joint space narrowing. IMPRESSION: Negative. Electronically Signed   By: Ulyses Jarred M.D.   On: 08/14/2017 01:52   Dg Humerus Left  Result Date: 08/14/2017 CLINICAL DATA:  Fall with hematoma EXAM: LEFT HUMERUS - 2+ VIEW COMPARISON:  None. FINDINGS: Acute fracture midshaft of the humerus with 1/2 bone with of posterior and lateral displacement. About 2 cm of overriding. The left humeral head is normally position. IMPRESSION: Acute displaced and overriding fracture midshaft of the humerus Electronically Signed   By: Donavan Foil M.D.   On: 08/14/2017 01:00    Review of Systems  Constitutional: Negative for weight loss.  HENT: Negative for ear discharge, ear pain, hearing loss and tinnitus.   Eyes: Negative for blurred vision, double vision, photophobia and pain.  Respiratory: Negative for cough, sputum production and shortness of breath.   Cardiovascular: Negative for chest pain.  Gastrointestinal: Negative for abdominal pain, nausea and vomiting.  Genitourinary: Negative for dysuria, flank pain, frequency and urgency.  Musculoskeletal: Positive for joint pain (Left shoulder). Negative for back pain, falls, myalgias and neck pain.  Neurological: Negative for dizziness, tingling, sensory change, focal weakness, loss of consciousness and headaches.  Endo/Heme/Allergies: Does not bruise/bleed easily.  Psychiatric/Behavioral: Negative for depression, memory loss and substance abuse. The patient is not nervous/anxious.    Blood pressure (!) 171/88, pulse 89, temperature 98 F (36.7 C), temperature source Oral, resp. rate 16, height 5' 2"  (1.575 m), weight 45.4 kg (100 lb), SpO2 97 %. Physical Exam  Constitutional: She appears well-developed and well-nourished. No distress.  HENT:  Head:  Normocephalic and atraumatic.  Eyes: Conjunctivae are normal. Right eye exhibits no discharge. Left eye exhibits no discharge. No scleral icterus.  Neck: Normal range of motion.  Cardiovascular: Normal rate and regular rhythm.  Respiratory: Effort normal. No respiratory distress.  Musculoskeletal:  Left shoulder, elbow, wrist, digits- 2 punctate wounds upper arm, TTP and with A/PROM  Sens  Ax/R/M/U intact  Mot   Ax/ R/ PIN/ M/ AIN/ U intact  Rad 2+  Neurological: She is alert.  Skin: Skin is warm and dry. She is not diaphoretic.  Psychiatric: She has a normal mood and affect. Her behavior is normal.    Assessment/Plan: Fall Open left humerus fx -- For I&D, ORIF this afternoon by Dr. Stann Mainland. NPO until then. Multiple medical problems including HTN, HLD, s/p porcine Ao valve, s/p pacemaker -- Will need IM admission, surgical clearance, appreciate help.    Lisette Abu, PA-C Orthopedic Surgery [phone] 08/15/2017, 3:12 PM

## 2017-08-15 NOTE — Op Note (Signed)
Date of Surgery: 08/15/2017  INDICATIONS: Holly Bolton is a 84 y.o.-year-old female with a left Type II open humeral shaft fracture.  She is a right-hand-dominant 84 year old who lives independently and is independent with all ADLs.  She presented to the emergency department on Tuesday evening with a humeral shaft fracture.  At that time it was not known that this was an open injury.  She presented for outpatient follow-up today and was noted in Dr. Denyse Amass Chandler's office to have a type II open fracture.  This was indicated for urgent irrigation and debridement as well as internal fixation.  She presented back to the emergency department where I was engaged.;  The patient did consent to the procedure after discussion of the risks and benefits.  PREOPERATIVE DIAGNOSIS: Type II open left humeral shaft fracture  POSTOPERATIVE DIAGNOSIS: Same.  PROCEDURE:  1.  Irrigation and debridement for open fracture.  Excisional debridement of skin, subcutaneous tissue, muscle, and bone at open fracture site to left humerus. 2.  Open reduction and internal fixation of left humeral shaft fracture  SURGEON: Holly Bolton, M.D.  ASSIST: None.  ANESTHESIA:  general  IV FLUIDS AND URINE: See anesthesia.  ESTIMATED BLOOD LOSS: 30 mL.  IMPLANTS:  Synthes 3.5 LC DC plate 3.5 mm cortical nonlocking screws 7  DRAINS: None  COMPLICATIONS: None.  DESCRIPTION OF PROCEDURE: The patient was brought to the operating room and placed Supine on the operating table.  The patient had been signed prior to the procedure and this was documented. The patient had the anesthesia placed by the anesthesiologist.  A time-out was performed to confirm that this was the correct patient, site, side and location. The patient did receive antibiotics prior to the incision and was re-dosed during the procedure as needed at indicated intervals.  A tourniquet Was not placed.  The patient had the operative extremity prepped and draped in the  standard surgical fashion.      After adequate analgesia was confirmed and timeout was performed we approach the left humerus via the anterolateral approach.  There was noted to be a 2 cm punctate wound directly in line with this anterolateral approach.  We utilized this and extended it proximally and distally just lateral to the biceps muscle belly.  Dissection was carried down through some PT needs tissue to the biceps fascia.  This was opened sharply.  There was noted to be a traumatic wound in the brachialis musculature from the proximal humeral fragment.  Deep retractors were placed underneath the biceps muscle belly.  We then divided the brachialis muscle in line with fibers.  Next we began the excisional debridement with knife and Ronjair.  The skin and septated tissue as well as fascia, muscle, and bone were debrided sharply of any nonviable tissue.  We then irrigated the wound with 2 L of normal saline.  Next we addressed the fracture.  The fracture was anatomically aligned and clamped in position with a point to point reduction clamp.  Next a 3.5 mm interfrag compression screw was placed by technique.  This had good bite.  We then applied an 8 hole 3.5 mm straight stainless still plate.  We placed 3 proximal and 3 distal bicortical nonlocking 3.5 mm screws.  These also had good purchase.  Next we confirmed placement of plate and adequacy of reduction with orthogonal Fluoroscopy.  We are satisfied with our reduction.  The wound was once again irrigated copiously with normal saline.  We then closed the subcu  dermal layer with 2-0 Monocryl.  The skin was closed with staples.  A standard sterile dressing was applied and a soft dressing was wrapped over the arm.  The arm was placed in a sling.  There were no intraoperative, location.  The patient tolerated the procedure well and was awakened without any issues. All counts were correct 2.  She was transferred to PACU in stable  condition.  POSTOPERATIVE PLAN:  The patient will remain in her sling until her follow-up appointment.  She may begin active and passive range of motion at the hand, wrist, and elbow.  Passive range motion only at the shoulder.  No weightbearing to the left arm.  She will return to see me in 2 weeks for postoperative wound check.  She will be on once daily aspirin for 4 weeks for DVT prophylaxis.  This may begin in the hospital.

## 2017-08-15 NOTE — ED Provider Notes (Signed)
5:11 PM Patient signed out to me at shift change.  Patient signed out pending blood work.  Patient with possibly open fracture, after mechanical fall yesterday.  Was seen by orthopedist this morning and because of concern of an open fracture was sent back here for surgical repair.  Patient was evaluated by Hilbert Odor, PA-C, and scheduled for surgery later on this afternoon.  I spoke with [facility] hospitalist, they will admit patient.  Asked for pacemaker interrogation.   Vitals:   08/15/17 1429  BP: (!) 171/88  Pulse: 89  Resp: 16  Temp: 98 F (36.7 C)  TempSrc: Oral  SpO2: 97%  Weight: 45.4 kg (100 lb)  Height: 5\' 2"  (1.575 m)      Jeannett Senior, PA-C 08/15/17 1713    Carmin Muskrat, MD 08/16/17 0030

## 2017-08-15 NOTE — ED Provider Notes (Signed)
[facility] EMERGENCY DEPARTMENT Provider Note   CSN: [id number] Arrival date & time: 08/15/17  1418   History   Chief Complaint Chief Complaint  Patient presents with  . fracture/admit    HPI Holly Bolton is a 84 y.o. right handed female who presents with L shoulder fracture. PMH significant for hypertension, hyperlipidemia, pacemaker, aortic valve replacement, ascending aortic aneurysm status post repair. She had a fall yesterday on to her left side on concrete while carrying out a bag of leaves and sustained a displaced mid-shaft L humerus fracture. She was on the ground for some time. She presented to the ED and was diagnosed with a fracture. She was advised to f/u with orthopedics and they advised her to the come to the ED because it was considered an open fracture which was not noted on initial presentation. They recommend I&D and ORIF today. She states currently her pain is controlled. She denies numbness, tingling, weakness of the hand. Family at bedside states that she has several puncture wounds on the arm like she was "shot".   HPI  Past Medical History:  Diagnosis Date  . Aneurysm (arteriovenous) of coronary vessels    ascending aorta requiring bypass of the LAD  . Asthma   . Carotid stenosis 04/28/08   Doppler: <40% stenosis bilateral  . Hyperlipemia   . Hypertension   . Pacemaker generator end of life 11/18/08   Intermittent high-grade atrioventricular block  . Peripheral arterial disease (HCC)    left ABI of 0.78  . Presence of permanent cardiac pacemaker 08/26/01   Sinus node dysfunction-St.Jude  . Status post ascending aortic aneurysm repair/AVR -  Medtronic Freestyle root 09/28/2014   ascending aorta requiring bypass of the LAD     Patient Active Problem List   Diagnosis Date Noted  . Pacemaker battery depletion 05/17/2017  . Leg cramps 06/12/2016  . Peripheral arterial disease ([place]) 12/08/2014  . Status post ascending aortic aneurysm  repair/AVR -  Medtronic Freestyle root 09/28/2014  . CAD s/p CABG (LIMA to LAD) 07/08/2013  . History of aortic root repairand bioprosthetic AVR 07/08/2013  . Hyperlipidemia 07/08/2013  . SSS (sick sinus syndrome) ([facility]) 07/08/2013  . Second degree AV block 07/08/2013  . Pacemaker, dual chamber St. Jude 2010 07/08/2013  . Essential hypertension 07/08/2013    Past Surgical History:  Procedure Laterality Date  . ABDOMINAL HYSTERECTOMY  1966  . APPENDECTOMY  1943  . ASCENDING AORTIC ANEURYSM REPAIR  09/22/97   porcine aortic root  . BREAST FIBROADENOMA SURGERY  11/89  . CARDIAC SURGERY    . CORONARY ARTERY BYPASS GRAFT  09/22/97   LIMA to the LAD  . [place]   left  . NEPHRECTOMY  1958   right  . OVARIAN CYST SURGERY  1948  . PACEMAKER GENERATOR CHANGE  11/18/08   [employer]Jude  . PACEMAKER INSERTION  08/26/01   [employer].Jude  . PPM GENERATOR CHANGEOUT N/A 05/16/2017   Procedure: PPM GENERATOR CHANGEOUT;  Surgeon: Sanda Klein, MD;  Location: [facility];  Service: Cardiovascular;  Laterality: N/A;     OB History   None      Home Medications    Prior to Admission medications   Medication Sig Start Date End Date Taking? Authorizing Provider  aspirin 81 MG tablet Take 81 mg by mouth 4 (four) times a week.     [provider]  brimonidine (ALPHAGAN) 0.2 % ophthalmic solution instill ONE DROP IN [facility] EYE  TWICE DAILY 03/31/16   [provider]  carvedilol (COREG) 3.125 MG tablet Take 3.125 mg by mouth 2 (two) times daily with a meal. 06/05/16   [provider]  Coenzyme Q10 (COQ10) 200 MG CAPS Take 200 mg by mouth daily.    [provider]  furosemide (LASIX) 20 MG tablet Take 1 tablet (20 mg total) by mouth 2 (two) times a week. Mondays and Fridays 08/13/17   Croitoru, Mihai, MD  HYDROcodone-acetaminophen (NORCO/VICODIN) 5-325 MG tablet Take 1 tablet by mouth every 6 (six) hours as needed. 08/14/17   Horton, Barbette Hair, MD  Magnesium  250 MG TABS Take 1 tablet (250 mg total) by mouth 2 (two) times daily. 12/15/15   Croitoru, Mihai, MD  olmesartan (BENICAR) 40 MG tablet Take 40 mg by mouth daily.    [provider]  trolamine salicylate (ASPERCREME) 10 % cream Apply 1 application topically as needed for muscle pain.    [provider]  vitamin B-12 (CYANOCOBALAMIN) 1000 MCG tablet Take 1,000 mcg by mouth daily.    [provider]    Family History Family History  Problem Relation Age of Onset  . Heart attack Mother   . Heart attack Father   . Heart attack Brother   . Hyperlipidemia Sister   . Hypertension Sister     Social History Social History   Tobacco Use  . Smoking status: Never Smoker  . Smokeless tobacco: Never Used  Substance Use Topics  . Alcohol use: No  . Drug use: No     Allergies   Accupril [quinapril hcl]; Amlodipine; Benadryl [diphenhydramine hcl]; Biaxin [clarithromycin]; Ciprofloxacin; Codeine; Diovan [valsartan]; Medrol [methylprednisolone]; Morphine and related; Neurontin [gabapentin]; and Penicillins   Review of Systems Review of Systems  Musculoskeletal: Positive for arthralgias.  Neurological: Negative for weakness and numbness.  All other systems reviewed and are negative.    Physical Exam Updated Vital Signs BP (!) 171/88 (BP Location: Right Arm)   Pulse 89   Temp 98 F (36.7 C) (Oral)   Resp 16   Ht 5\' 2"  (1.575 m)   Wt 45.4 kg (100 lb)   SpO2 97%   BMI 18.29 kg/m   Physical Exam  Constitutional: She is oriented to person, place, and time. She appears well-developed and well-nourished. No distress.  Pleasant, calm, cooperative  HENT:  Head: Normocephalic and atraumatic.  Eyes: Pupils are equal, round, and reactive to light. Conjunctivae are normal. Right eye exhibits no discharge. Left eye exhibits no discharge. No scleral icterus.  Neck: Normal range of motion.  Cardiovascular: Normal rate and regular rhythm.  Pulmonary/Chest: Effort  normal and breath sounds normal. No respiratory distress.  Abdominal: She exhibits no distension.  Musculoskeletal:  Left arm is in a sling with splint. 2+ radial pulse. FROM of fingers. Sensation intact.  Neurological: She is alert and oriented to person, place, and time.  Skin: Skin is warm and dry.  Psychiatric: She has a normal mood and affect. Her behavior is normal.  Nursing note and vitals reviewed.    ED Treatments / Results  Labs (all labs ordered are listed, but only abnormal results are displayed) Labs Reviewed  CBC WITH DIFFERENTIAL/PLATELET - Abnormal; Notable for the following components:      Result Value   RBC 3.11 (*)    Hemoglobin 9.9 (*)    HCT 31.3 (*)    MCV 100.6 (*)    RDW 16.7 (*)    Monocytes Absolute 1.1 (*)  All other components within normal limits  BASIC METABOLIC PANEL    EKG None  Radiology Dg Chest 2 View  Result Date: 08/15/2017 CLINICAL DATA:  Preoperative examination for patient with a left humerus fracture. EXAM: CHEST - 2 VIEW COMPARISON:  PA and lateral chest 12/17/2014. FINDINGS: The patient is status post CABG with a pacing device in place. Small to moderate bilateral pleural effusions are seen, larger on the left. There is left basilar airspace disease. Heart size is enlarged. No pulmonary edema. IMPRESSION: Small to moderate bilateral pleural effusions, larger on the left. Associated basilar airspace disease is likely atelectasis. Cardiomegaly without edema. Electronically Signed   By: Inge Rise M.D.   On: 08/15/2017 15:13   Dg Pelvis 1-2 Views  Result Date: 08/14/2017 CLINICAL DATA:  Left hip pain after fall EXAM: PELVIS - 1-2 VIEW COMPARISON:  None. FINDINGS: There is no evidence of pelvic fracture or diastasis. No pelvic bone lesions are seen. Mild bilateral hip joint space narrowing. IMPRESSION: Negative. Electronically Signed   By: Ulyses Jarred M.D.   On: 08/14/2017 01:52   Dg Humerus Left  Result Date: 08/14/2017 CLINICAL  DATA:  Fall with hematoma EXAM: LEFT HUMERUS - 2+ VIEW COMPARISON:  None. FINDINGS: Acute fracture midshaft of the humerus with 1/2 bone with of posterior and lateral displacement. About 2 cm of overriding. The left humeral head is normally position. IMPRESSION: Acute displaced and overriding fracture midshaft of the humerus Electronically Signed   By: Donavan Foil M.D.   On: 08/14/2017 01:00    Procedures Procedures (including critical care time)  Medications Ordered in ED Medications - No data to display   Initial Impression / Assessment and Plan / ED Course  I have reviewed the triage vital signs and the nursing notes.  Pertinent labs & imaging results that were available during my care of the patient were reviewed by me and considered in my medical decision making (see chart for details).  84 year old female with acute open displaced humerus fracture. She is hypertensive but otherwise vitals are normal. She is neurovascularly intact. Pre-op labs were obtained. Hughes Better PA-C with ortho evaluated patient. Will consult hospitalist for admission.  Final Clinical Impressions(s) / ED Diagnoses   Final diagnoses:  Open displaced transverse fracture of shaft of left humerus, initial encounter    ED Discharge Orders    None       Recardo Evangelist, PA-C 08/16/17 1000    Daleen Bo, MD 08/17/17 [phone]

## 2017-08-15 NOTE — Progress Notes (Signed)
Windle Guard, St. Jude rep, contacted for pacer intervention during surgery. Aaron Edelman stated that patient is only pacer dependent about 1/3 of the time so there is no need for a magnet. If a magnet was to be used, she would pace at a rate of 100 bpm. Since pacer was placed recently, she should have at least ten years worth of battery. Helene Kelp, CRNA made aware of recommendations and spoke with Aaron Edelman as well regarding pacer instructions while in PACU.

## 2017-08-15 NOTE — H&P (Addendum)
History and Physical    Holly Bolton YIF:[id number] DOB: 13-Jul-1922 DOA: 08/15/2017   PCP: Jani Gravel, MD   Patient coming from:  Home    Chief Complaint: Fall  HPI: Holly Bolton is a 84 y.o. female with a history of asthma, hyperlipidemia, hypertension, history of pacemaker placement in 2010 with generator change in April 2019, peripheral arterial disease, history of diastolic dysfunction, hypertension, hyperlipidemia, brought to the emergency department today, after falling last Monday was taking her garbage out, which caused a fracture to the left humerus.  Initially, she had been seen at the emergency department, at which time no surgical repair was recommended and follow-up with orthopedics was instructed.  In today's visit with orthopedics, and after the films were reviewed it was determined that pt needed to report to ED for ortho eval and I&D/ORIF. Patient  Has a h/o aortic valve replacement with porcine valve but has not been taking any blood thinners other than aspirin. She denies any any other areas of bleeding. She denies any chest pain or palpitations, or shortness of breath or cough.  She feels very weak, and her pain is controlled with current medications.  Denies any nausea or vomiting, diarrhea, dysuria or gross hematuria.  She denies any lower extremity swelling, or any other areas of fracture.  She denies any calf pain.  She denies any tobacco abuse, alcohol or recreational drug use.  ED Course:  BP (!) 171/88 (BP Location: Right Arm)   Pulse 89   Temp 98 F (36.7 C) (Oral)   Resp 16   Ht 5\' 2"  (1.575 m)   Wt 45.4 kg (100 lb)   SpO2 97%   BMI 18.29 kg/m   At the ED, she is undergoing pacemaker interrogation, in preparation for OR. Sodium 140, potassium 4.6, bicarb 24, glucose 115, BUN 28, creatinine 1.85, calcium 8.8, anion gap 12, GFR 26  White count 8.8, hemoglobin 9.9, platelets 210 Chest x-raySmall to moderate bilateral pleural effusions, larger on the left.  Associated basilar airspace disease is likely atelectasis. Urinalysis negative Cardiomegaly without edema 2D echo on 07/13/2015 EF 55 to 86%, normal systolic, diastolic  In short stay just prior to her surgery, BP remained elevated, HR in 80s. She was given coreg prior to OR.  review of Systems:  As per HPI otherwise all other systems reviewed and are negative  Past Medical History:  Diagnosis Date  . Aneurysm (arteriovenous) of coronary vessels    ascending aorta requiring bypass of the LAD  . Asthma   . Carotid stenosis 04/28/08   Doppler: <40% stenosis bilateral  . Hyperlipemia   . Hypertension   . Pacemaker generator end of life 11/18/08   Intermittent high-grade atrioventricular block  . Peripheral arterial disease (HCC)    left ABI of 0.78  . Presence of permanent cardiac pacemaker 08/26/01   Sinus node dysfunction-St.Jude  . Status post ascending aortic aneurysm repair/AVR -  Medtronic Freestyle root 09/28/2014   ascending aorta requiring bypass of the LAD     Past Surgical History:  Procedure Laterality Date  . ABDOMINAL HYSTERECTOMY  1966  . APPENDECTOMY  1943  . ASCENDING AORTIC ANEURYSM REPAIR  09/22/97   porcine aortic root  . BREAST FIBROADENOMA SURGERY  11/89  . CARDIAC SURGERY    . CORONARY ARTERY BYPASS GRAFT  09/22/97   LIMA to the LAD  . [place]   left  . NEPHRECTOMY  1958   right  . OVARIAN CYST  SURGERY  1948  . PACEMAKER GENERATOR CHANGE  11/18/08   [facility]  . PACEMAKER INSERTION  08/26/01   [employer]  . PPM GENERATOR CHANGEOUT N/A 05/16/2017   Procedure: PPM GENERATOR CHANGEOUT;  Surgeon: Sanda Klein, MD;  Location: [facility];  Service: Cardiovascular;  Laterality: N/A;    Social History Social History   Socioeconomic History  . Marital status: Widowed    Spouse name: Not on file  . Number of children: 1  . Years of education: 79  . Highest education level: Not on file  Occupational History  . Occupation: Retired    Scientific laboratory technician  . Financial resource strain: Not on file  . Food insecurity:    Worry: Not on file    Inability: Not on file  . Transportation needs:    Medical: Not on file    Non-medical: Not on file  Tobacco Use  . Smoking status: Never Smoker  . Smokeless tobacco: Never Used  Substance and Sexual Activity  . Alcohol use: No  . Drug use: No  . Sexual activity: Not on file  Lifestyle  . Physical activity:    Days per week: Not on file    Minutes per session: Not on file  . Stress: Not on file  Relationships  . Social connections:    Talks on phone: Not on file    Gets together: Not on file    Attends religious service: Not on file    Active member of club or organization: Not on file    Attends meetings of clubs or organizations: Not on file    Relationship status: Not on file  . Intimate partner violence:    Fear of current or ex partner: Not on file    Emotionally abused: Not on file    Physically abused: Not on file    Forced sexual activity: Not on file  Other Topics Concern  . Not on file  Social History Narrative   Lives with son, Algis Greenhouse   Caffeine use: 1 cup coffee per day   Right handed     Allergies  Allergen Reactions  . Accupril [Quinapril Hcl] Other (See Comments)    Unknown reaction   . Amlodipine Swelling  . Benadryl [Diphenhydramine Hcl] Other (See Comments)    Unknown reaction   . Biaxin [Clarithromycin] Other (See Comments)    Unknown reaction   . Ciprofloxacin Other (See Comments)    Unknown reaction   . Codeine Other (See Comments)    Unknown reaction   . Diovan [Valsartan] Other (See Comments)    Unknown reaction   . Medrol [Methylprednisolone] Other (See Comments)    Unknown  . Morphine And Related Other (See Comments)    Unknown reaction   . Neurontin [Gabapentin] Other (See Comments)    Unknown reaction   . Penicillins     Unknown reaction  Has patient had a PCN reaction causing immediate rash, facial/tongue/throat  swelling, SOB or lightheadedness with hypotension: Unknown Has patient had a PCN reaction causing severe rash involving mucus membranes or skin necrosis: Unknown Has patient had a PCN reaction that required hospitalization: Unknown Has patient had a PCN reaction occurring within the last 10 years: No If all of the above answers are "NO", then may proceed with Cephalosporin use.     Family History  Problem Relation Age of Onset  . Heart attack Mother   . Heart attack Father   . Heart attack Brother   . Hyperlipidemia  Sister   . Hypertension Sister        Prior to Admission medications   Medication Sig Start Date End Date Taking? Authorizing Provider  aspirin 81 MG tablet Take 81 mg by mouth daily as needed for pain.    Yes [provider]  brimonidine (ALPHAGAN) 0.2 % ophthalmic solution instill ONE DROP IN [facility] E[facility] TWICE DAILY 03/31/16  Yes [provider]  carvedilol (COREG) 3.125 MG tablet Take 3.125 mg by mouth 2 (two) times daily with a meal. 06/05/16  Yes [provider]  Coenzyme Q10 (COQ10) 200 MG CAPS Take 200 mg by mouth daily.   Yes [provider]  furosemide (LASIX) 20 MG tablet Take 1 tablet (20 mg total) by mouth 2 (two) times a week. Mondays and Fridays 08/13/17  Yes Croitoru, Mihai, MD  HYDROcodone-acetaminophen (NORCO/VICODIN) 5-325 MG tablet Take 1 tablet by mouth every 6 (six) hours as needed. 08/14/17  Yes Horton, Barbette Hair, MD  ibuprofen (ADVIL,MOTRIN) 200 MG tablet Take 200 mg by mouth every 6 (six) hours as needed for moderate pain.   Yes [provider]  Magnesium 250 MG TABS Take 1 tablet (250 mg total) by mouth 2 (two) times daily. 12/15/15  Yes Croitoru, Mihai, MD  olmesartan (BENICAR) 40 MG tablet Take 40 mg by mouth daily.   Yes [provider]  trolamine salicylate (ASPERCREME) 10 % cream Apply 1 application topically as needed for muscle pain.   Yes [provider]  vitamin B-12 (CYANOCOBALAMIN) 1000  MCG tablet Take 1,000 mcg by mouth 2 (two) times daily.    Yes [provider]     Physical Exam:  Vitals:   08/15/17 1429  BP: (!) 171/88  Pulse: 89  Resp: 16  Temp: 98 F (36.7 C)  TempSrc: Oral  SpO2: 97%  Weight: 45.4 kg (100 lb)  Height: 5\' 2"  (1.575 m)   Constitutional: NAD, calm. Appears younger than stated age. Eyes: PERRL, lids and conjunctivae normal ENMT: Mucous membranes are moist, without exudate or lesions  Neck: normal, supple, no masses, no thyromegaly Respiratory: clear to auscultation bilaterally, no wheezing, no crackles. Normal respiratory effort  Cardiovascular: Regular rate and rhythm,  murmur, rubs or gallops.  Pacemaker on the right,  no extremity edema. 2+ pedal pulses. No carotid bruits.  Abdomen: Soft, non tender, No hepatosplenomegaly. Bowel sounds positive.  Musculoskeletal: Remarkable for left shoulder tenderness, sling present, and immobilized.  Unable to perform range of motion.   Skin: no jaundice, No lesions.  Neurologic: Sensation intact  Strength equal in all extremities, unable to test the left arm Psychiatric:   Alert and oriented x 4. Normal mood.    Labs on Admission: I have personally reviewed following labs and imaging studies  CBC: Recent Labs  Lab 08/14/17 0103 08/15/17 1535  WBC 14.3* 8.8  NEUTROABS 12.3* 6.9  HGB 9.9* 9.9*  HCT 31.6* 31.3*  MCV 100.3* 100.6*  PLT 228 354    Basic Metabolic Panel: Recent Labs  Lab 08/14/17 0103 08/15/17 1535  NA 139 140  K 4.3 4.6  CL 107 104  CO2 24 24  GLUCOSE 171* 115*  BUN 24* 28*  CREATININE 1.66* 1.85*  CALCIUM 8.8* 8.8*    GFR: Estimated Creatinine Clearance: 13.9 mL/min (A) (by C-G formula based on SCr of 1.85 mg/dL (H)).  Liver Function Tests: No results for input(s): AST, ALT, ALKPHOS, BILITOT, PROT, ALBUMIN in the last 168 hours. No results for input(s): LIPASE, AMYLASE in the last  168 hours. No results for input(s): AMMONIA in the last 168  hours.  Coagulation Profile: No results for input(s): INR, PROTIME in the last 168 hours.  Cardiac Enzymes: No results for input(s): CKTOTAL, CKMB, CKMBINDEX, TROPONINI in the last 168 hours.  BNP (last 3 results) No results for input(s): PROBNP in the last 8760 hours.  HbA1C: No results for input(s): HGBA1C in the last 72 hours.  CBG: No results for input(s): GLUCAP in the last 168 hours.  Lipid Profile: No results for input(s): CHOL, HDL, LDLCALC, TRIG, CHOLHDL, LDLDIRECT in the last 72 hours.  Thyroid Function Tests: No results for input(s): TSH, T4TOTAL, FREET4, T3FREE, THYROIDAB in the last 72 hours.  Anemia Panel: No results for input(s): VITAMINB12, FOLATE, FERRITIN, TIBC, IRON, RETICCTPCT in the last 72 hours.  Urine analysis:    Component Value Date/Time   COLORURINE YELLOW 08/14/2017 0121   APPEARANCEUR CLEAR 08/14/2017 0121   LABSPEC 1.009 08/14/2017 0121   PHURINE 5.0 08/14/2017 0121   GLUCOSEU NEGATIVE 08/14/2017 0121   HGBUR NEGATIVE 08/14/2017 0121   BILIRUBINUR NEGATIVE 08/14/2017 0121   KETONESUR NEGATIVE 08/14/2017 0121   PROTEINUR NEGATIVE 08/14/2017 0121   NITRITE NEGATIVE 08/14/2017 0121   LEUKOCYTESUR NEGATIVE 08/14/2017 0121    Sepsis Labs: @LABRCNTIP (procalcitonin:4,lacticidven:4) )No results found for this or any previous visit (from the past 240 hour(s)).   Radiological Exams on Admission: Dg Chest 2 View  Result Date: 08/15/2017 CLINICAL DATA:  Preoperative examination for patient with a left humerus fracture. EXAM: CHEST - 2 VIEW COMPARISON:  PA and lateral chest 12/17/2014. FINDINGS: The patient is status post CABG with a pacing device in place. Small to moderate bilateral pleural effusions are seen, larger on the left. There is left basilar airspace disease. Heart size is enlarged. No pulmonary edema. IMPRESSION: Small to moderate bilateral pleural effusions, larger on the left. Associated basilar airspace disease is likely atelectasis.  Cardiomegaly without edema. Electronically Signed   By: Inge Rise M.D.   On: 08/15/2017 15:13   Dg Pelvis 1-2 Views  Result Date: 08/14/2017 CLINICAL DATA:  Left hip pain after fall EXAM: PELVIS - 1-2 VIEW COMPARISON:  None. FINDINGS: There is no evidence of pelvic fracture or diastasis. No pelvic bone lesions are seen. Mild bilateral hip joint space narrowing. IMPRESSION: Negative. Electronically Signed   By: Ulyses Jarred M.D.   On: 08/14/2017 01:52   Dg Humerus Left  Result Date: 08/14/2017 CLINICAL DATA:  Fall with hematoma EXAM: LEFT HUMERUS - 2+ VIEW COMPARISON:  None. FINDINGS: Acute fracture midshaft of the humerus with 1/2 bone with of posterior and lateral displacement. About 2 cm of overriding. The left humeral head is normally position. IMPRESSION: Acute displaced and overriding fracture midshaft of the humerus Electronically Signed   By: Donavan Foil M.D.   On: 08/14/2017 01:00    EKG: Independently reviewed.  Assessment/Plan Principal Problem:   Open left humeral fracture Active Problems:   CAD s/p CABG (LIMA to LAD)   Hyperlipidemia   Pacemaker, dual chamber [employer]. Jude 2010   Essential hypertension   Status post ascending aortic aneurysm repair/AVR -  Medtronic Freestyle root   Peripheral arterial disease (HCC)   Asthma   Status post fall, with acute left open humeral fracture, orthopedics has seen and evaluated the patient, to OR today for ORIF and I and D. Her RCRI of one risk factor (h/o CHF) gives her a risk of MACE of approx 1%. Note she does not have a h/o CAD and that  LIMA to LAD was done for aortic aneurysm, not obstructive coronary disease. Admit to IP telemetry NPO for now  SCDs Pain control per ortho  EKG prior to surgery Hold ASA PT/OT consult postop Cont beta blocker perioperatively  History of sick sinus syndrome, status post pacemaker placement, [employer] 2010,-status post ascending aortic aneurysm repair-AVR-Medtronic freestyle root- s/p LIMA to  LAD for aortic aneurysm.  EKG sinus rhythm left bundle branch block.2D echo on 07/13/2015 EF 55 to 62%, normal systolic, grade I diastolic dysfunction. CXR showing some pleural effusions and atelectasis. Recommend aggressive incentive spirometry and pulm toilet postop.   -Interrogation pending, if cleared, OK for OR  Follow up with Cards as outpatient   Continue Coreg  Hold lasix due to AKI on CKD, watch creat, lytes    Hypertension  Continue BB, titrate as needed  Anemia of chronic disease Hemoglobin on admission 9.9 stable   Repeat CBC in am  No transfusion is indicated at this time   Hyperlipidemia Continue home statin  AKI on chronic kidney disease Current Cr is 1.85, from baseline of 1.4-1.7. Likely due to decreased oral intake and diuretic. Cont to monitor.  -dose all meds for GFR -avoid nephrotoxins -hold lasix for now  Lab Results  Component Value Date   CREATININE 1.85 (H) 08/15/2017   CREATININE 1.66 (H) 08/14/2017   CREATININE 1.73 (H) 05/16/2017    Hold diuretics today    Repeat BMP tomorrow Hold NSAIDS    DVT prophylaxis:  SCD  Code Status:    Full  Family Communication:  Discussed with patient Disposition Plan: Expect patient to be discharged to home after condition improves Consults called:    Ortho per EDP  Admission status:  Admit - It is my clinical opinion that admission to [place] is reasonable and necessary because of the expectation that this patient will require hospital care that crosses at least 2 midnights to treat this condition based on the medical complexity of the problems presented.  Given the aforementioned information, the predictability of an adverse outcome is felt to be significant.    Janora Norlander, MD [facility]   I have seen and examined the patient.This note has been amended by me to reflect my history and exam as well as medical decision making.   08/15/2017, 5:26 PM

## 2017-08-15 NOTE — Transfer of Care (Signed)
Immediate Anesthesia Transfer of Care Note  Patient: Holly Bolton  Procedure(s) Performed: OPEN REDUCTION INTERNAL FIXATION (ORIF) PROXIMAL HUMERUS FRACTURE (Left Arm Upper) IRRIGATION AND DEBRIDEMENT EXTREMITY (Left Arm Upper)  Patient Location: PACU  Anesthesia Type:General  Level of Consciousness: awake and alert   Airway & Oxygen Therapy: Patient connected to nasal cannula oxygen  Post-op Assessment: Report given to RN and Post -op Vital signs reviewed and stable  Post vital signs: Reviewed and stable  Last Vitals:  Vitals Value Taken Time  BP 160/56 08/15/2017 10:50 PM  Temp    Pulse 70 08/15/2017 10:59 PM  Resp 13 08/15/2017 10:59 PM  SpO2 99 % 08/15/2017 10:59 PM  Vitals shown include unvalidated device data.  Last Pain:  Vitals:   08/15/17 2259  TempSrc:   PainSc: 7          Complications: No apparent anesthesia complications

## 2017-08-15 NOTE — Anesthesia Preprocedure Evaluation (Addendum)
Anesthesia Evaluation  Patient identified by MRN, date of birth, ID band Patient awake    Reviewed: Allergy & Precautions, NPO status , Patient's Chart, lab work & pertinent test results, reviewed documented beta blocker date and time   History of Anesthesia Complications Negative for: history of anesthetic complications  Airway Mallampati: II  TM Distance: >3 FB Neck ROM: Full    Dental  (+) Caps, Dental Advisory Given   Pulmonary neg pulmonary ROS,    breath sounds clear to auscultation       Cardiovascular hypertension, Pt. on medications and Pt. on home beta blockers (-) angina+ CAD, + CABG and + Peripheral Vascular Disease (s/p aortic root replacement)  + pacemaker (AVB) + Valvular Problems/Murmurs  Rhythm:Regular Rate:Normal  '17 ECHO: EF 55-60%, AVR functioning well, mild-mod TR  1999 aortic root replacement/AVR with a Medtronic Freestyle root, coronary reimplantation and a single vessel LIMA to LAD bypass in 1999 (for bicuspid valve with severe stenosis and ascending aortic aneurysm)  1999 dual-chamber permanent pacemaker for sinus node dysfunction and intermittent high-grade AV block.  2010, 2019  pacemaker generator change    Neuro/Psych Pt states L arm is broken and numb    GI/Hepatic negative GI ROS, Neg liver ROS,   Endo/Other  negative endocrine ROS  Renal/GU Renal InsufficiencyRenal disease (s/p nephrectomy, creat 1.85)     Musculoskeletal L humerus fracture   Abdominal   Peds  Hematology  (+) Blood dyscrasia (Hb 9.9), ,   Anesthesia Other Findings   Reproductive/Obstetrics                            Anesthesia Physical Anesthesia Plan  ASA: III  Anesthesia Plan: General   Post-op Pain Management:    Induction: Intravenous  PONV Risk Score and Plan: 3 and Ondansetron, Dexamethasone and Treatment may vary due to age or medical condition  Airway Management Planned:  Oral ETT  Additional Equipment:   Intra-op Plan:   Post-operative Plan: Extubation in OR  Informed Consent: I have reviewed the patients History and Physical, chart, labs and discussed the procedure including the risks, benefits and alternatives for the proposed anesthesia with the patient or authorized representative who has indicated his/her understanding and acceptance.   Dental advisory given  Plan Discussed with: CRNA and Surgeon  Anesthesia Plan Comments: (Plan routine monitors, GETA No brachial plexus block with pt c/o L arm numbness from fracture)        Anesthesia Quick Evaluation

## 2017-08-15 NOTE — Anesthesia Procedure Notes (Signed)
Procedure Name: Intubation Date/Time: 08/15/2017 9:06 PM Performed by: Valetta Fuller, CRNA Pre-anesthesia Checklist: Patient identified, Emergency Drugs available, Suction available and Patient being monitored Patient Re-evaluated:Patient Re-evaluated prior to induction Oxygen Delivery Method: Circle system utilized Preoxygenation: Pre-oxygenation with 100% oxygen Induction Type: IV induction Ventilation: Mask ventilation without difficulty Laryngoscope Size: Miller and 2 Grade View: Grade I Tube type: Oral Tube size: 7.5 mm Number of attempts: 1 Airway Equipment and Method: Stylet Placement Confirmation: ETT inserted through vocal cords under direct vision,  positive ETCO2 and breath sounds checked- equal and bilateral Secured at: 23 cm Tube secured with: Tape Dental Injury: Teeth and Oropharynx as per pre-operative assessment

## 2017-08-15 NOTE — Progress Notes (Signed)
RN text paged Admissions because pt middle initial is "H" instead of "L"

## 2017-08-15 NOTE — ED Triage Notes (Signed)
Pt reports that she was raking leaves yesterday and fell breaking her arm.

## 2017-08-16 ENCOUNTER — Inpatient Hospital Stay (HOSPITAL_COMMUNITY): Payer: Medicare Other

## 2017-08-16 DIAGNOSIS — D649 Anemia, unspecified: Secondary | ICD-10-CM

## 2017-08-16 DIAGNOSIS — J45909 Unspecified asthma, uncomplicated: Secondary | ICD-10-CM

## 2017-08-16 DIAGNOSIS — I361 Nonrheumatic tricuspid (valve) insufficiency: Secondary | ICD-10-CM

## 2017-08-16 DIAGNOSIS — N183 Chronic kidney disease, stage 3 (moderate): Secondary | ICD-10-CM

## 2017-08-16 DIAGNOSIS — I739 Peripheral vascular disease, unspecified: Secondary | ICD-10-CM

## 2017-08-16 LAB — TROPONIN I: Troponin I: 0.09 ng/mL (ref <0.03)

## 2017-08-16 LAB — BASIC METABOLIC PANEL
Anion gap: 10 (ref 5–15)
BUN: 27 mg/dL — ABNORMAL HIGH (ref 8–23)
CO2: 24 mmol/L (ref 22–32)
Calcium: 8.3 mg/dL — ABNORMAL LOW (ref 8.9–10.3)
Chloride: 105 mmol/L (ref 98–111)
Creatinine, Ser: 1.67 mg/dL — ABNORMAL HIGH (ref 0.44–1.00)
GFR calc Af Amer: 30 mL/min — ABNORMAL LOW (ref >60)
GFR calc non Af Amer: 25 mL/min — ABNORMAL LOW (ref >60)
Glucose, Bld: 109 mg/dL — ABNORMAL HIGH (ref 70–99)
Potassium: 4.8 mmol/L (ref 3.5–5.1)
Sodium: 139 mmol/L (ref 135–145)

## 2017-08-16 LAB — CBC
HCT: 29.2 % — ABNORMAL LOW (ref 36.0–46.0)
Hemoglobin: 9 g/dL — ABNORMAL LOW (ref 12.0–15.0)
MCH: 31 pg (ref 26.0–34.0)
MCHC: 30.8 g/dL (ref 30.0–36.0)
MCV: 100.7 fL — ABNORMAL HIGH (ref 78.0–100.0)
Platelets: 172 10*3/uL (ref 150–400)
RBC: 2.9 MIL/uL — ABNORMAL LOW (ref 3.87–5.11)
RDW: 16.7 % — ABNORMAL HIGH (ref 11.5–15.5)
WBC: 10.4 10*3/uL (ref 4.0–10.5)

## 2017-08-16 LAB — ECHOCARDIOGRAM COMPLETE
Height: 62 in
Weight: 1600 oz

## 2017-08-16 MED ORDER — ONDANSETRON HCL 4 MG PO TABS: 4.0000 mg | ORAL_TABLET | Freq: Four times a day (QID) | ORAL | Status: DC | PRN

## 2017-08-16 MED ORDER — TRAMADOL HCL 50 MG PO TABS
50.0000 mg | ORAL_TABLET | Freq: Four times a day (QID) | ORAL | Status: DC | PRN
Start: 2017-08-16 — End: 2017-08-20
  Administered 2017-08-18: 50 mg via ORAL
  Filled 2017-08-16: qty 1

## 2017-08-16 MED ORDER — METOCLOPRAMIDE HCL 5 MG PO TABS: 5.0000 mg | ORAL_TABLET | Freq: Three times a day (TID) | ORAL | Status: DC | PRN

## 2017-08-16 MED ORDER — CARVEDILOL 6.25 MG PO TABS
6.2500 mg | ORAL_TABLET | Freq: Two times a day (BID) | ORAL | Status: DC
  Administered 2017-08-16 – 2017-08-20 (×9): 6.25 mg via ORAL
  Filled 2017-08-16 (×9): qty 1

## 2017-08-16 MED ORDER — CEFAZOLIN SODIUM-DEXTROSE 1-4 GM/50ML-% IV SOLN
1.0000 g | Freq: Two times a day (BID) | INTRAVENOUS | Status: AC
  Administered 2017-08-16 (×2): 1 g via INTRAVENOUS
  Filled 2017-08-16 (×2): qty 50

## 2017-08-16 MED ORDER — IRBESARTAN 150 MG PO TABS: 150.0000 mg | ORAL_TABLET | Freq: Every day | ORAL | Status: DC

## 2017-08-16 MED ORDER — ONDANSETRON HCL 4 MG/2ML IJ SOLN: 4.0000 mg | Freq: Four times a day (QID) | INTRAMUSCULAR | Status: DC | PRN

## 2017-08-16 MED ORDER — IRBESARTAN 150 MG PO TABS
150.0000 mg | ORAL_TABLET | Freq: Every day | ORAL | Status: DC
  Administered 2017-08-16 – 2017-08-20 (×5): 150 mg via ORAL
  Filled 2017-08-16 (×5): qty 1

## 2017-08-16 MED ORDER — DOCUSATE SODIUM 100 MG PO CAPS
100.0000 mg | ORAL_CAPSULE | Freq: Two times a day (BID) | ORAL | Status: DC
  Administered 2017-08-16 – 2017-08-20 (×8): 100 mg via ORAL
  Filled 2017-08-16 (×8): qty 1

## 2017-08-16 MED ORDER — METOCLOPRAMIDE HCL 5 MG/ML IJ SOLN: 5.0000 mg | Freq: Three times a day (TID) | INTRAMUSCULAR | Status: DC | PRN

## 2017-08-16 MED ORDER — FUROSEMIDE 20 MG PO TABS
20.0000 mg | ORAL_TABLET | ORAL | Status: DC
  Administered 2017-08-17 – 2017-08-20 (×2): 20 mg via ORAL
  Filled 2017-08-16 (×2): qty 1

## 2017-08-16 MED ORDER — ASPIRIN 81 MG PO CHEW
324.0000 mg | CHEWABLE_TABLET | Freq: Once | ORAL | Status: AC
  Administered 2017-08-16: 324 mg via ORAL
  Filled 2017-08-16: qty 4

## 2017-08-16 MED ORDER — HYDROCODONE-ACETAMINOPHEN 5-325 MG PO TABS: 1.0000 | ORAL_TABLET | ORAL | Status: DC | PRN

## 2017-08-16 MED ORDER — CARVEDILOL 6.25 MG PO TABS: 6.2500 mg | ORAL_TABLET | Freq: Two times a day (BID) | ORAL | Status: DC

## 2017-08-16 MED ORDER — ACETAMINOPHEN 500 MG PO TABS
1000.0000 mg | ORAL_TABLET | Freq: Four times a day (QID) | ORAL | Status: DC | PRN
  Administered 2017-08-16: 1000 mg via ORAL
  Administered 2017-08-17: 500 mg via ORAL
  Administered 2017-08-20: 1000 mg via ORAL
  Filled 2017-08-16 (×3): qty 2

## 2017-08-16 NOTE — Progress Notes (Signed)
  Echocardiogram 2D Echocardiogram has been performed.  Randa Lynn Rico Massar 08/16/2017, 3:42 PM

## 2017-08-16 NOTE — Progress Notes (Signed)
Occupational Therapy Evaluation Patient Details Name: Holly Bolton MRN: [id number] DOB: 1922/12/13 Today's Date: 08/16/2017    History of Present Illness Holly Bolton is a 84 y.o. female with a history of asthma, hyperlipidemia, hypertension, history of pacemaker placement in 2010 with generator change in April 2019, peripheral arterial disease, history of diastolic dysfunction, hypertension, hyperlipidemia, brought to the emergency department from her Orthopedic appointment for evaluation and surgery for a L humeral fracture she sustained when taking her garbage out two days before (in ED on Monday she was deemed not a surgical candidate); now s/p ORIF L humeral fracture.   Clinical Impression   PTA patient reports independent with ADL, IADL and mobility.  She currently requires modA for UB ADL, max assist for LB ADL, and mod assist for toilet transfers.  She is significantly limited by pain in L UE and generalized weakness, see below for further problem list.  Education initiated on precautions, sling wear schedule, edema reduction, ice, exercises and compensatory techniques for increased participation in ADLs.  Patients son available for assist, but recommend 24/7 supervision and would benefit from continued short term rehab at SNF prior to dc home in order to maximize independence with self care.  Will continue to follow while admitted.     Follow Up Recommendations  SNF;Supervision/Assistance - 24 hour    Equipment Recommendations  3 in 1 bedside commode    Recommendations for Other Services       Precautions / Restrictions Precautions Precautions: Fall Required Braces or Orthoses: Sling Restrictions Weight Bearing Restrictions: Yes LUE Weight Bearing: Non weight bearing Other Position/Activity Restrictions: L UE shoulder PROM only, AROM hand wrist, elbow; all movement as tolerated       Mobility Bed Mobility Overal bed mobility: Needs Assistance Bed Mobility: Supine to Sit      Supine to sit: Mod assist     General bed mobility comments: see PT note, PT assist with bed mobility  Transfers Overall transfer level: Needs assistance Equipment used: 2 person hand held assist Transfers: Sit to/from [facility] Sit to Stand: Mod assist Stand pivot transfers: Mod assist;+2 safety/equipment       General transfer comment: cueing to scoot forward EOB with minA using pad, modA x 1 to power up to standing but +2 during stand pivot for safety; cueing for safety and techniques     Balance Overall balance assessment: Needs assistance Sitting-balance support: Single extremity supported;Feet supported Sitting balance-Leahy Scale: Fair Sitting balance - Comments: very kyphotic in sitting   Standing balance support: Single extremity supported;During functional activity Standing balance-Leahy Scale: Poor Standing balance comment: reliant on 1 UE support and external support                           ADL either performed or assessed with clinical judgement   ADL Overall ADL's : Needs assistance/impaired Eating/Feeding: Set up;Sitting   Grooming: Minimal assistance;Sitting   Upper Body Bathing: Moderate assistance;Sitting   Lower Body Bathing: Moderate assistance;Sit to/from stand   Upper Body Dressing : Moderate assistance;Sitting;Cueing for UE precautions;Cueing for sequencing;Cueing for safety   Lower Body Dressing: Sit to/from stand;Maximal assistance;Cueing for compensatory techniques;Cueing for safety   Toilet Transfer: +2 for safety/equipment;Moderate assistance(simulated to recliner ) Toilet Transfer Details (indicate cue type and reason): cueing for safety, hand placement and sequencing  Toileting- Clothing Manipulation and Hygiene: Total assistance;Sit to/from stand       Functional mobility during ADLs: Moderate assistance;+2  for safety/equipment(stand pivot transfer only) General ADL Comments: patient greatly limited by  pain but highly motivated     Vision Baseline Vision/History: Wears glasses(does not have glasses here) Wears Glasses: At all times Patient Visual Report: No change from baseline Vision Assessment?: No apparent visual deficits     Perception     Praxis      Pertinent Vitals/Pain Pain Assessment: Faces Faces Pain Scale: Hurts whole lot Pain Location: L UE  Pain Descriptors / Indicators: Aching;Grimacing;Guarding Pain Intervention(s): Repositioned;Monitored during session;Limited activity within patient's tolerance     Hand Dominance Right   Extremity/Trunk Assessment Upper Extremity Assessment Upper Extremity Assessment: LUE deficits/detail;Generalized weakness LUE Deficits / Details: s/p ORIF, movement restrictions (see above), limited ROM due to pain elbow distally (PROM shoulder 0-60 FF, elbow PROM d/t pain) LUE: Unable to fully assess due to pain(WFL wrist, hand; limited elbow/shoulder) LUE Sensation: decreased light touch LUE Coordination: decreased fine motor;decreased gross motor   Lower Extremity Assessment Lower Extremity Assessment: Defer to PT evaluation   Cervical / Trunk Assessment Cervical / Trunk Assessment: Kyphotic   Communication Communication Communication: No difficulties   Cognition Arousal/Alertness: Awake/alert Behavior During Therapy: WFL for tasks assessed/performed Overall Cognitive Status: Within Functional Limits for tasks assessed                                     General Comments  son present during session, VSS    Exercises Exercises: Shoulder Shoulder Exercises Shoulder Flexion: PROM;10 reps;Seated;Left Elbow Flexion: PROM;Left;10 reps;Seated(limited ROM dt pain) Elbow Extension: PROM;Left;10 reps;Seated(limited range d/t pain) Wrist Flexion: AROM;10 reps;Seated;Left Wrist Extension: AROM;Left;10 reps;Seated Digit Composite Flexion: AROM;Left;10 reps;Seated Composite Extension: AROM;Left;10 reps;Seated    Shoulder Instructions Shoulder Instructions Donning/doffing sling/immobilizer: Maximal assistance Correct positioning of sling/immobilizer: Maximal assistance ROM for elbow, wrist and digits of operated UE: Moderate assistance    Home Living Family/patient expects to be discharged to:: Private residence Living Arrangements: Children Available Help at Discharge: Family;Available PRN/intermittently Type of Home: House Home Access: Stairs to enter [employer] of Steps: 2 Entrance Stairs-Rails: None Home Layout: One level     Bathroom Shower/Tub: Teacher, early years/pre: Standard     Home Equipment: Cane - single point          Prior Functioning/Environment Level of Independence: Independent with assistive device(s)        Comments: independent ADL, IADL, used cane in community        OT Problem List: Decreased strength;Decreased range of motion;Decreased activity tolerance;Decreased coordination;Decreased safety awareness;Decreased knowledge of use of DME or AE;Pain;Increased edema      OT Treatment/Interventions: Self-care/ADL training;Energy conservation;DME and/or AE instruction;Therapeutic exercise;Balance training;Patient/family education;Therapeutic activities    OT Goals(Current goals can be found in the care plan section) Acute Rehab OT Goals Patient Stated Goal: heal and back to normal OT Goal Formulation: With patient/family Time For Goal Achievement: 08/30/17 Potential to Achieve Goals: Good  OT Frequency: Min 2X/week   Barriers to D/C:            Co-evaluation PT/OT/SLP Co-Evaluation/Treatment: Yes Reason for Co-Treatment: For patient/therapist safety;To address functional/ADL transfers   OT goals addressed during session: ADL's and self-care      AM-PAC PT "6 Clicks" Daily Activity     Outcome Measure Help from another person eating meals?: A Little Help from another person taking care of personal grooming?: A  Little Help from another person toileting, which  includes using toliet, bedpan, or urinal?: Total Help from another person bathing (including washing, rinsing, drying)?: A Lot Help from another person to put on and taking off regular upper body clothing?: A Lot Help from another person to put on and taking off regular lower body clothing?: A Lot 6 Click Score: 13   End of Session Equipment Utilized During Treatment: Gait belt(sling) Nurse Communication: Mobility status  Activity Tolerance: Patient tolerated treatment well;Patient limited by pain Patient left: in chair;with call bell/phone within reach;with chair alarm set  OT Visit Diagnosis: Other abnormalities of gait and mobility (R26.89);Muscle weakness (generalized) (M62.81);Pain Pain - Right/Left: Left                Time: 5638-9373 OT Time Calculation (min): 40 min Charges:  OT General Charges $OT Visit: 1 Visit OT Evaluation $OT Eval Moderate Complexity: 1 Mod OT Treatments $Therapeutic Exercise: 8-22 mins G-Codes:     Delight Stare, OTR/L  Pager [place] 08/16/2017, 10:57 AM

## 2017-08-16 NOTE — Progress Notes (Signed)
PROGRESS NOTE    Holly Bolton  XBM:[id number] DOB: 06/07/1922 DOA: 08/15/2017 PCP: Jani Gravel, MD      Brief Narrative:  Holly Bolton is a 84 y.o. F with HTN, SSS with pacer, history of AV repair, pig valve as well as LIMA to LAD not for atherosclerosis, chronic normocytic anemia and CKD III who presents with fall and open left humeral fracture.      Assessment & Plan:  Open left humerus fracture 2 OR on 7/3 with Dr. Alvan Dame.  Uncomplicated. -Acetaminophen and tramadol for pain -Morphine PRN -PT eval  Hypertension BP not well controlled. -Increase carvedilol -Restart Benicar -Continue furosemide -Avoid hydralazine with ?ischemia  SSS status post pacer -Continue BB  Elevated troponin Low level, flat.  ACS within differential, has had a mild constant chest tightness since fall.   ECG personally reviewed, appears paced.  No change from previous. -Aspirin 325 mg once -Augment BP control as above -Obtain echocardiogram  Chronic normocytic anemia Stable post-operatively  CKD stage III Stable, relative to baseline 1.5        DVT prophylaxis: SCDs Code Status: FULL Family Communication: Son at bedside MDM and disposition Plan: The below labs and imaging reports were reviewed and summarized above.    The patient was admitted with humerus fracture, went to OR 7.3.    Now with elevated troponin, will obtain echo.       Consultants:   Orthopedics  Procedures:  7/3 OPEN REDUCTION INTERNAL FIXATION (ORIF) PROXIMAL HUMERUS FRACTURE (Left) IRRIGATION AND DEBRIDEMENT EXTREMITY (Left)   Echocardiogram    Antimicrobials:   None    Subjective: Feels pain in left arm.  Moving hand well.  Mild confusion from baseline, but no agitation.  BP up, feels mild constant chest tightness since fall.  No dyspnea, cough.    Objective: Vitals:   08/16/17 0020 08/16/17 0448 08/16/17 0906 08/16/17 0915  BP: (!) 176/64 (!) 169/61 (!) 142/75 127/73  Pulse: 71 72 82     Resp:  14    Temp: 97.7 F (36.5 C) 98 F (36.7 C)    TempSrc: Oral Oral    SpO2: 100% 100%  91%  Weight:      Height:        Intake/Output Summary (Last 24 hours) at 08/16/2017 1112 Last data filed at 08/16/2017 0600 Gross per 24 hour  Intake 1005.02 ml  Output 30 ml  Net 975.02 ml   Filed Weights   08/15/17 1429  Weight: 45.4 kg (100 lb)    Examination: General appearance: thin small eldely adult female, alert and in no acute distress.  Sitting in recliner HEENT: Anicteric, conjunctiva pink, lids and lashes normal. No nasal deformity, discharge, epistaxis.  Lips moist, teeth normal, OP dry, no oral lesions, hearing normal.   Skin: Warm and dry.  no jaundice.  No suspicious rashes or lesions. Cardiac: Tachycardic, regular, nl S1-S2, no murmurs appreciated.  Capillary refill is brisk.  JVP not visible.  2+ LE edema.  Radia  pulses 2+ and symmetric. Respiratory: Normal respiratory rate and rhythm.  CTAB without rales or wheezes. Abdomen: Abdomen soft.  No TTP. No ascites, distension, hepatosplenomegaly.   MSK: No deformities or effusions other than left arm swelling and in sling. Neuro: Awake and alert.  EOMI, cranial nerves 3-12 intact.  Right arm strength normal, left arm in sling. Speech fluent.    Psych: Sensorium intact and responding to questions, oriented to person, place and time, attention normal. Affect blunted.  Judgment and  insight appear slightly impaired.  A little slow in responses.    Data Reviewed: I have personally reviewed following labs and imaging studies:  CBC: Recent Labs  Lab 08/14/17 0103 08/15/17 1535 08/16/17 0448  WBC 14.3* 8.8 10.4  NEUTROABS 12.3* 6.9  --   HGB 9.9* 9.9* 9.0*  HCT 31.6* 31.3* 29.2*  MCV 100.3* 100.6* 100.7*  PLT 228 210 836   Basic Metabolic Panel: Recent Labs  Lab 08/14/17 0103 08/15/17 1535 08/16/17 0448  NA 139 140 139  K 4.3 4.6 4.8  CL 107 104 105  CO2 24 24 24   GLUCOSE 171* 115* 109*  BUN 24* 28* 27*   CREATININE 1.66* 1.85* 1.67*  CALCIUM 8.8* 8.8* 8.3*   GFR: Estimated Creatinine Clearance: 15.4 mL/min (A) (by C-G formula based on SCr of 1.67 mg/dL (H)). Liver Function Tests: No results for input(s): AST, ALT, ALKPHOS, BILITOT, PROT, ALBUMIN in the last 168 hours. No results for input(s): LIPASE, AMYLASE in the last 168 hours. No results for input(s): AMMONIA in the last 168 hours. Coagulation Profile: Recent Labs  Lab 08/15/17 1815  INR 1.22   Cardiac Enzymes: Recent Labs  Lab 08/15/17 1923 08/16/17 0448  TROPONINI 0.10* 0.09*   BNP (last 3 results) No results for input(s): PROBNP in the last 8760 hours. HbA1C: No results for input(s): HGBA1C in the last 72 hours. CBG: No results for input(s): GLUCAP in the last 168 hours. Lipid Profile: No results for input(s): CHOL, HDL, LDLCALC, TRIG, CHOLHDL, LDLDIRECT in the last 72 hours. Thyroid Function Tests: No results for input(s): TSH, T4TOTAL, FREET4, T3FREE, THYROIDAB in the last 72 hours. Anemia Panel: No results for input(s): VITAMINB12, FOLATE, FERRITIN, TIBC, IRON, RETICCTPCT in the last 72 hours. Urine analysis:    Component Value Date/Time   COLORURINE YELLOW 08/14/2017 0121   APPEARANCEUR CLEAR 08/14/2017 0121   LABSPEC 1.009 08/14/2017 0121   PHURINE 5.0 08/14/2017 0121   GLUCOSEU NEGATIVE 08/14/2017 0121   HGBUR NEGATIVE 08/14/2017 0121   BILIRUBINUR NEGATIVE 08/14/2017 0121   KETONESUR NEGATIVE 08/14/2017 0121   PROTEINUR NEGATIVE 08/14/2017 0121   NITRITE NEGATIVE 08/14/2017 0121   LEUKOCYTESUR NEGATIVE 08/14/2017 0121   Sepsis Labs: @LABRCNTIP (procalcitonin:4,lacticacidven:4)  ) Recent Results (from the past 240 hour(s))  MRSA PCR Screening     Status: None   Collection Time: 08/15/17  6:57 PM  Result Value Ref Range Status   MRSA by PCR NEGATIVE NEGATIVE Final    Comment:        The GeneXpert MRSA Assay (FDA approved for NASAL specimens only), is one component of a comprehensive MRSA  colonization surveillance program. It is not intended to diagnose MRSA infection nor to guide or monitor treatment for MRSA infections. Performed at [facility], [place] [address], [place], [place] [address]          Radiology Studies: Dg Chest 2 View  Result Date: 08/15/2017 CLINICAL DATA:  Preoperative examination for patient with a left humerus fracture. EXAM: CHEST - 2 VIEW COMPARISON:  PA and lateral chest 12/17/2014. FINDINGS: The patient is status post CABG with a pacing device in place. Small to moderate bilateral pleural effusions are seen, larger on the left. There is left basilar airspace disease. Heart size is enlarged. No pulmonary edema. IMPRESSION: Small to moderate bilateral pleural effusions, larger on the left. Associated basilar airspace disease is likely atelectasis. Cardiomegaly without edema. Electronically Signed   By: Inge Rise M.D.   On: 08/15/2017 15:13   Dg Humerus  Left  Result Date: 08/16/2017 CLINICAL DATA:  Fracture left humeral shaft. EXAM: LEFT HUMERUS - 2+ VIEW COMPARISON:  Intraoperative fluoroscopy 08/15/2017. Left humerus 08/14/2017 FINDINGS: Postoperative changes with plate and screw fixation of a fracture of the midshaft left humerus. Fracture fragments appear well approximated. Alignment and position appears anatomic. Skin clips consistent with recent surgery. No focal bone lesion or bone destruction. IMPRESSION: Anatomic alignment and position of fractures of the midshaft left humerus after plate and screw fixation. Electronically Signed   By: Lucienne Capers M.D.   On: 08/16/2017 01:20   Dg Humerus Left  Result Date: 08/15/2017 CLINICAL DATA:  ORIF left humerus EXAM: LEFT HUMERUS - 2+ VIEW; DG C-ARM 61-120 MIN COMPARISON:  08/14/2017 FINDINGS: Three low resolution intraoperative spot views of the left humerus. Total fluoroscopy time was 11 seconds. Images demonstrate surgical plate and screw fixation of mid humerus fracture with anatomic  alignment IMPRESSION: Intraoperative fluoroscopic assistance provided during surgical fixation of left humerus fracture. Electronically Signed   By: Donavan Foil M.D.   On: 08/15/2017 23:24   Dg C-arm 1-60 Min  Result Date: 08/15/2017 CLINICAL DATA:  ORIF left humerus EXAM: LEFT HUMERUS - 2+ VIEW; DG C-ARM 61-120 MIN COMPARISON:  08/14/2017 FINDINGS: Three low resolution intraoperative spot views of the left humerus. Total fluoroscopy time was 11 seconds. Images demonstrate surgical plate and screw fixation of mid humerus fracture with anatomic alignment IMPRESSION: Intraoperative fluoroscopic assistance provided during surgical fixation of left humerus fracture. Electronically Signed   By: Donavan Foil M.D.   On: 08/15/2017 23:24        Scheduled Meds: . brimonidine  1 drop Both Eyes BID  . carvedilol  6.25 mg Oral BID WC  . docusate sodium  100 mg Oral BID  . [START ON 08/17/2017] furosemide  20 mg Oral Once per day on Mon Fri  . irbesartan  150 mg Oral Daily  . vitamin B-12  1,000 mcg Oral BID   Continuous Infusions: .  ceFAZolin (ANCEF) IV Stopped (08/16/17 0547)  . lactated ringers 10 mL/hr at 08/15/17 1958     LOS: 1 day    Time spent: 35 minutes    Edwin Dada, MD [facility] 08/16/2017, 11:12 AM     Pager [phone] --- please page though AMION:  [email or web address] Password [email or web address] If 7PM-7AM, please contact night-coverage

## 2017-08-16 NOTE — NC FL2 (Signed)
[place] MEDICAID FL2 LEVEL OF CARE SCREENING TOOL     IDENTIFICATION  Patient Name: Holly Bolton Birthdate: 1923-02-04 Sex: female Admission Date (Current Location): 08/15/2017  [facility] and [place] Number:  Herbalist and Address:  [facility], [place] [address], [place],  [address]      Provider Number: [id number]  Attending Physician Name and Address:  Edwin Dada, *  Relative Name and Phone Number:  Icess Bertoni, son, [phone]    Current Level of Care: Hospital Recommended Level of Care: [place] Prior Approval Number:    Date Approved/Denied:   PASRR Number: [id number]  Discharge Plan: SNF    Current Diagnoses: Patient Active Problem List   Diagnosis Date Noted  . Asthma 08/15/2017  . Open left humeral fracture 08/15/2017  . Fracture 08/15/2017  . Pacemaker battery depletion 05/17/2017  . Leg cramps 06/12/2016  . Peripheral arterial disease ([place]) 12/08/2014  . Status post ascending aortic aneurysm repair/AVR -  Medtronic Freestyle root 09/28/2014  . CAD s/p CABG (LIMA to LAD) 07/08/2013  . History of aortic root repairand bioprosthetic AVR 07/08/2013  . Hyperlipidemia 07/08/2013  . SSS (sick sinus syndrome) ([place]) 07/08/2013  . Second degree AV block 07/08/2013  . Pacemaker, dual chamber St. Jude 2010 07/08/2013  . Essential hypertension 07/08/2013    Orientation RESPIRATION BLADDER Height & Weight     Self, Situation, Place  Normal Incontinent Weight: 100 lb (45.4 kg) Height:  5\' 2"  (157.5 cm)  BEHAVIORAL SYMPTOMS/MOOD NEUROLOGICAL BOWEL NUTRITION STATUS      Continent Diet(See DC Summary)  AMBULATORY STATUS COMMUNICATION OF NEEDS Skin   Extensive Assist Verbally Surgical wounds                       Personal Care Assistance Level of Assistance  Bathing, Dressing, Feeding Bathing Assistance: Maximum assistance Feeding assistance: Limited assistance Dressing Assistance:  Maximum assistance     Functional Limitations Info  Sight, Hearing, Speech Sight Info: Adequate Hearing Info: Adequate Speech Info: Adequate    SPECIAL CARE FACTORS FREQUENCY  OT (By licensed OT), PT (By licensed PT)     PT Frequency: 2x week OT Frequency: 2x week            Contractures      Additional Factors Info  Code Status, Allergies Code Status Info: Full Allergies Info: ACCUPRIL QUINAPRIL HCL, AMLODIPINE, BENADRYL DIPHENHYDRAMINE HCL, BIAXIN CLARITHROMYCIN, CIPROFLOXACIN, CODEINE, DIOVAN VALSARTAN, MEDROL METHYLPREDNISOLONE, MORPHINE AND RELATED, NEURONTIN GABAPENTIN, PENICILLINS            Current Medications (08/16/2017):  This is the current hospital active medication list Current Facility-Administered Medications  Medication Dose Route Frequency Provider Last Rate Last Dose  . acetaminophen (TYLENOL) tablet 1,000 mg  1,000 mg Oral Q6H PRN Paralee Cancel, MD   1,000 mg at 08/16/17 0845  . bisacodyl (DULCOLAX) EC tablet 5 mg  5 mg Oral Daily PRN Nicholes Stairs, MD      . brimonidine [facility]) 0.2 % ophthalmic solution 1 drop  1 drop Both Eyes BID Nicholes Stairs, MD   1 drop at 08/16/17 1014  . carvedilol (COREG) tablet 6.25 mg  6.25 mg Oral BID WC Edwin Dada, MD   6.25 mg at 08/16/17 0846  . ceFAZolin (ANCEF) IVPB 1 g/50 mL premix  1 g Intravenous Q12H Nicholes Stairs, MD   Stopped at 08/16/17 [phone]  . docusate sodium (COLACE) capsule 100 mg  100  mg Oral BID Nicholes Stairs, MD   100 mg at 08/16/17 0123  . [START ON 08/17/2017] furosemide (LASIX) tablet 20 mg  20 mg Oral Once per day on Mon Fri [place], Suann Larry, MD      . irbesartan (AVAPRO) tablet 150 mg  150 mg Oral Daily Edwin Dada, MD   150 mg at 08/16/17 0846  . lactated ringers infusion   Intravenous Continuous Edwin Dada, MD 75 mL/hr at 08/16/17 1546    . metoCLOPramide (REGLAN) tablet 5-10 mg  5-10 mg Oral Q8H PRN Nicholes Stairs, MD        Or  . metoCLOPramide [facility]) injection 5-10 mg  5-10 mg Intravenous Q8H PRN Nicholes Stairs, MD      . morphine 4 MG/ML injection 0.52 mg  0.52 mg Intravenous Q4H PRN Nicholes Stairs, MD      . ondansetron [facility]) tablet 4 mg  4 mg Oral Q6H PRN Nicholes Stairs, MD       Or  . ondansetron [facility]) injection 4 mg  4 mg Intravenous Q6H PRN Nicholes Stairs, MD      . senna-docusate (Senokot-S) tablet 1 tablet  1 tablet Oral QHS PRN Nicholes Stairs, MD      . sodium phosphate (FLEET) 7-19 GM/118ML enema 1 enema  1 enema Rectal Once PRN Nicholes Stairs, MD      . traMADol [facility]) tablet 50-100 mg  50-100 mg Oral Q6H PRN Paralee Cancel, MD      . vitamin B-12 (CYANOCOBALAMIN) tablet 1,000 mcg  1,000 mcg Oral BID Nicholes Stairs, MD   1,000 mcg at 08/16/17 4709     Discharge Medications: Please see discharge summary for a list of discharge medications.  Relevant Imaging Results:  Relevant Lab Results:   Additional Information SS#: [id number]  Normajean Baxter, LCSW

## 2017-08-16 NOTE — Progress Notes (Signed)
Patient ID: Holly Bolton, female   DOB: 30-Jul-1922, 84 y.o.   MRN: [id number] Subjective: 1 Day Post-Op Procedure(s) (LRB): OPEN REDUCTION INTERNAL FIXATION (ORIF) PROXIMAL HUMERUS FRACTURE (Left) IRRIGATION AND DEBRIDEMENT EXTREMITY (Left)    Patient reports pain as mild to moderate.  A little confusion, family and support in room.  Review injury and ORIF and pain med concerns  Objective:   VITALS:   Vitals:   08/16/17 0020 08/16/17 0448  BP: (!) 176/64 (!) 169/61  Pulse: 71 72  Resp:  14  Temp: 97.7 F (36.5 C) 98 F (36.7 C)  SpO2: 100% 100%    Neurovascular intact Incision: dressing C/D/I  LABS Recent Labs    08/14/17 0103 08/15/17 1535 08/16/17 0448  HGB 9.9* 9.9* 9.0*  HCT 31.6* 31.3* 29.2*  WBC 14.3* 8.8 10.4  PLT 228 210 172    Recent Labs    08/14/17 0103 08/15/17 1535 08/16/17 0448  NA 139 140 139  K 4.3 4.6 4.8  BUN 24* 28* 27*  CREATININE 1.66* 1.85* 1.67*  GLUCOSE 171* 115* 109*    Recent Labs    08/15/17 1815  INR 1.22     Assessment/Plan: 1 Day Post-Op Procedure(s) (LRB): OPEN REDUCTION INTERNAL FIXATION (ORIF) PROXIMAL HUMERUS FRACTURE (Left) IRRIGATION AND DEBRIDEMENT EXTREMITY (Left)   Advance diet Up with therapy  D/C Norco Tramadol for severe pain otherwise tylenol for pain  Ice to left arm Weight bearing per Stann Main[place]

## 2017-08-16 NOTE — Progress Notes (Signed)
PT Cancellation Note  Patient Details Name: [name] MRN: [id number] DOB: [birth year]   Cancelled Treatment:    Reason Eval/Treat Not Completed: Active bedrest order   Paged Dr. Loleta Books to see about increasing activity orders via Amion;   [name], PT  Acute Rehabilitation Services Pager [phone] Office [phone]    [name] 08/16/2017, 8:18 AM

## 2017-08-16 NOTE — Evaluation (Signed)
Physical Therapy Evaluation Patient Details Name: Holly Bolton MRN: [id number] DOB: [birth year] Today's Date: 08/16/2017   History of Present Illness  Holly Bolton is a 84 y.o. female with a history of asthma, hyperlipidemia, hypertension, history of pacemaker placement in 2010 with generator change in April 2019, peripheral arterial disease, history of diastolic dysfunction, hypertension, hyperlipidemia, brought to the emergency department from her Orthopedic appointment for evaluation and surgery for a L humeral fracture she sustained when taking her garbage out two days before (in ED on Monday she was deemed not a surgical candidate); now s/p ORIF L humeral fracture.  Clinical Impression   Patient is s/p above surgery resulting in functional limitations due to the deficits listed below (see PT Problem List). Prior to this fall (while taking out garbage), Holly Bolton was managing independently in her home (lives with her son), no assistive device in home, cane for community amb; Presents with gait and balance dysfunction, decr activity tolerance, now post ORIF; She voiced frustration that the ED initially sent her home -- it has aparently been very hard to manage for the two days before returning to hospital; Took time to validate pt's frustration with the situation, and gain some rapport, with goal of increasing pt's participation in therapy;  Patient will benefit from skilled PT to increase their independence and safety with mobility to allow discharge to the venue listed below.       Follow Up Recommendations SNF    Equipment Recommendations  Other (comment)(to be determined at SNF)    Recommendations for Other Services       Precautions / Restrictions Precautions Precautions: Fall Restrictions Weight Bearing Restrictions: Yes LUE Weight Bearing: Non weight bearing Other Position/Activity Restrictions: Will defer to OT for LUE restrictions      Mobility  Bed Mobility Overal bed  mobility: Needs Assistance Bed Mobility: Supine to Sit     Supine to sit: Mod assist     General bed mobility comments: Cues for technique; Mod assist to elevate trunk to sitting; used bed pad to square off hips at EOB; got up on pt's R ight side  Transfers Overall transfer level: Needs assistance Equipment used: 2 person hand held assist Transfers: Sit to/from [facility] Sit to Stand: Mod assist Stand pivot transfers: Mod assist;+2 safety/equipment       General transfer comment: Cues to scoot to EOB in prep for standing, and use of bed pad to assist in scooting closer to EOB; Mod assist to power up; Short, uncoordinated shuffle steps to pivot to chair and then a few backward steps with mod assist for balance and to help with weight shifting for steps  Ambulation/Gait                Stairs            Wheelchair Mobility    Modified Rankin (Stroke Patients Only)       Balance Overall balance assessment: Needs assistance   Sitting balance-Leahy Scale: Fair Sitting balance - Comments: very kyphotic in sitting     Standing balance-Leahy Scale: Poor                               Pertinent Vitals/Pain Pain Assessment: Faces Faces Pain Scale: Hurts whole lot Pain Location: L shoulder with movement Pain Descriptors / Indicators: Aching;Grimacing;Guarding Pain Intervention(s): Repositioned    Home Living Family/patient expects to be discharged to:: Private residence Living  Arrangements: Children Available Help at Discharge: Family;Available PRN/intermittently Type of Home: House Home Access: Stairs to enter Entrance Stairs-Rails: None Entrance Stairs-Number of Steps: 2 Home Layout: One level Home Equipment: Cane - single point      Prior Function Level of Independence: Independent with assistive device(s)         Comments: Cane for community ambulation     Hand Dominance   Dominant Hand: Right     Extremity/Trunk Assessment   Upper Extremity Assessment Upper Extremity Assessment: Defer to OT evaluation    Lower Extremity Assessment Lower Extremity Assessment: Generalized weakness    Cervical / Trunk Assessment Cervical / Trunk Assessment: Kyphotic  Communication   Communication: No difficulties  Cognition Arousal/Alertness: Awake/alert Behavior During Therapy: WFL for tasks assessed/performed Overall Cognitive Status: Within Functional Limits for tasks assessed                                        General Comments General comments (skin integrity, edema, etc.): Session conducted on Room Air and lowest observed O2 sat 90%; Sats were 94% at end of session; notified RN staff    Exercises     Assessment/Plan    PT Assessment Patient needs continued PT services  PT Problem List Decreased strength;Decreased range of motion;Decreased activity tolerance;Decreased balance;Decreased mobility;Decreased coordination;Decreased knowledge of use of DME;Decreased safety awareness;Decreased knowledge of precautions;Pain       PT Treatment Interventions DME instruction;Gait training;Functional mobility training;Therapeutic activities;Therapeutic exercise;Balance training;Patient/family education    PT Goals (Current goals can be found in the Care Plan section)  Acute Rehab PT Goals Patient Stated Goal: heal and back to normal PT Goal Formulation: With patient Time For Goal Achievement: 08/30/17 Potential to Achieve Goals: Good    Frequency Min 2X/week   Barriers to discharge        Co-evaluation               AM-PAC PT "6 Clicks" Daily Activity  Outcome Measure Difficulty turning over in bed (including adjusting bedclothes, sheets and blankets)?: Unable Difficulty moving from lying on back to sitting on the side of the bed? : Unable Difficulty sitting down on and standing up from a chair with arms (e.g., wheelchair, bedside commode, etc,.)?:  Unable Help needed moving to and from a bed to chair (including a wheelchair)?: A Lot Help needed walking in hospital room?: A Lot Help needed climbing 3-5 steps with a railing? : A Lot 6 Click Score: 9    End of Session Equipment Utilized During Treatment: Gait belt Activity Tolerance: Patient tolerated treatment well Patient left: in chair;with call bell/phone within reach;with chair alarm set Nurse Communication: Mobility status PT Visit Diagnosis: Unsteadiness on feet (R26.81);Other abnormalities of gait and mobility (R26.89);History of falling (Z91.81);Pain Pain - Right/Left: Left Pain - part of body: Shoulder    Time: 4268-3419 PT Time Calculation (min) (ACUTE ONLY): 40 min   Charges:   PT Evaluation $PT Eval Moderate Complexity: 1 Mod     PT G Codes:        [name], PT  Acute Rehabilitation Services Pager [phone] Office [phone]   [name] 08/16/2017, 10:32 AM

## 2017-08-17 ENCOUNTER — Encounter (HOSPITAL_COMMUNITY): Payer: Self-pay | Admitting: Orthopedic Surgery

## 2017-08-17 ENCOUNTER — Other Ambulatory Visit: Payer: Self-pay | Admitting: Cardiology

## 2017-08-17 DIAGNOSIS — R778 Other specified abnormalities of plasma proteins: Secondary | ICD-10-CM

## 2017-08-17 DIAGNOSIS — R7989 Other specified abnormal findings of blood chemistry: Principal | ICD-10-CM

## 2017-08-17 DIAGNOSIS — D539 Nutritional anemia, unspecified: Secondary | ICD-10-CM

## 2017-08-17 DIAGNOSIS — I25709 Atherosclerosis of coronary artery bypass graft(s), unspecified, with unspecified angina pectoris: Secondary | ICD-10-CM

## 2017-08-17 LAB — BASIC METABOLIC PANEL
Anion gap: 8 (ref 5–15)
BUN: 22 mg/dL (ref 8–23)
CO2: 21 mmol/L — ABNORMAL LOW (ref 22–32)
Calcium: 7.5 mg/dL — ABNORMAL LOW (ref 8.9–10.3)
Chloride: 109 mmol/L (ref 98–111)
Creatinine, Ser: 1.35 mg/dL — ABNORMAL HIGH (ref 0.44–1.00)
GFR calc Af Amer: 38 mL/min — ABNORMAL LOW (ref >60)
GFR calc non Af Amer: 33 mL/min — ABNORMAL LOW (ref >60)
Glucose, Bld: 95 mg/dL (ref 70–99)
Potassium: 4.5 mmol/L (ref 3.5–5.1)
Sodium: 138 mmol/L (ref 135–145)

## 2017-08-17 LAB — CBC
HCT: 23.3 % — ABNORMAL LOW (ref 36.0–46.0)
Hemoglobin: 7.4 g/dL — ABNORMAL LOW (ref 12.0–15.0)
MCH: 31.9 pg (ref 26.0–34.0)
MCHC: 31.8 g/dL (ref 30.0–36.0)
MCV: 100.4 fL — ABNORMAL HIGH (ref 78.0–100.0)
Platelets: 161 10*3/uL (ref 150–400)
RBC: 2.32 MIL/uL — ABNORMAL LOW (ref 3.87–5.11)
RDW: 16.7 % — ABNORMAL HIGH (ref 11.5–15.5)
WBC: 7.2 10*3/uL (ref 4.0–10.5)

## 2017-08-17 LAB — PREPARE RBC (CROSSMATCH)

## 2017-08-17 LAB — VITAMIN B12: Vitamin B-12: 1996 pg/mL — ABNORMAL HIGH (ref 180–914)

## 2017-08-17 MED ORDER — TRAZODONE HCL 50 MG PO TABS
50.0000 mg | ORAL_TABLET | Freq: Once | ORAL | Status: AC
  Administered 2017-08-17: 50 mg via ORAL
  Filled 2017-08-17: qty 1

## 2017-08-17 MED ORDER — SODIUM CHLORIDE 0.9% IV SOLUTION
Freq: Once | INTRAVENOUS | Status: AC
Start: 2017-08-17 — End: 2017-08-17
  Administered 2017-08-17: 17:00:00 via INTRAVENOUS

## 2017-08-17 NOTE — Progress Notes (Signed)
Patient ID: Holly Bolton, female   DOB: Apr 14, 1922, 84 y.o.   MRN: [id number] Subjective: 2 Days Post-Op Procedure(s) (LRB): OPEN REDUCTION INTERNAL FIXATION (ORIF) PROXIMAL HUMERUS FRACTURE (Left) IRRIGATION AND DEBRIDEMENT EXTREMITY (Left)    Patient reports pain as mild to moderate.  Does not recall having surgery, bu states pain is better than before surgery.  Objective:   VITALS:   Vitals:   08/16/17 1917 08/17/17 0428  BP: 113/65 (!) 132/48  Pulse: (!) 119 60  Resp: 15 13  Temp: 98.1 F (36.7 C) 98.4 F (36.9 C)  SpO2: 95% 94%    Neurovascular intact Incision: dressing C/D/I  All nerves intact  LABS Recent Labs    08/15/17 1535 08/16/17 0448 08/17/17 0345  HGB 9.9* 9.0* 7.4*  HCT 31.3* 29.2* 23.3*  WBC 8.8 10.4 7.2  PLT 210 172 161    Recent Labs    08/15/17 1535 08/16/17 0448 08/17/17 0345  NA 140 139 138  K 4.6 4.8 4.5  BUN 28* 27* 22  CREATININE 1.85* 1.67* 1.35*  GLUCOSE 115* 109* 95    Recent Labs    08/15/17 1815  INR 1.22     Assessment/Plan: 2 Days Post-Op Procedure(s) (LRB): OPEN REDUCTION INTERNAL FIXATION (ORIF) PROXIMAL HUMERUS FRACTURE (Left) IRRIGATION AND DEBRIDEMENT EXTREMITY (Left)   Advance diet Up with therapy   Tramadol for severe pain otherwise tylenol for pain  Ice to left arm NWB to LUE Ok for ROM at elbow as toleatled and shoulder - daily dry dressing changes once daily.

## 2017-08-17 NOTE — Progress Notes (Addendum)
PROGRESS NOTE    ELEASHA CATALDO  FKC:[id number] DOB: January 21, 1923 DOA: 08/15/2017 PCP: Jani Gravel, MD      Brief Narrative:  [name] is a 84 y.o. F with HTN, SSS with pacer, history of AV repair, pig valve as well as LIMA to LAD not for atherosclerosis, chronic normocytic anemia and CKD III who presents with fall and open left humeral fracture.      Assessment & Plan:  Open left humerus fracture Went to OR on 7/3 with Dr. Alvan Dame.  Uncomplicated surgery. -Continue acetaminophen or tramadol for pain -Non-weight bearing on LUE -PT recommend SNF   Hypertension BP controlled. -Continue new dose BB -Continue Benicar -Continue furosemide  SSS status post pacer -Continue BB  Elevated troponin No symptoms of chest pain today.  Troponins minimally elevated post-operatively.  No dyspnea, respiratory distress.  Discussed by phone with Cardiology today.  Echo shows no change in EF, no RWMA.  In that context, doubt ACS.    Recommend NM stress as outpatient.  However, patient had a PERSANTINE stress in 2011 that made her feel like she was "going to die", and family and patient choose to defer stress testing for now. -Will plan for outpatient Cardiology follow up in 2 weeks, to discuss Lexiscan stress testing  Chronic normocytic anemia Hemoglobin down to 7.4 today.  In light of elevated troponin/cardiac disease, I recommend a transfusion threshold of 8 g/dL, and will transfuse 1 unit tonight.  MCV elevated. -Transfuse 1 unit -Check B12, folate  CKD stage III Stable, relative to baseline 1.5, improved to 1.3 today.        DVT prophylaxis: SCDs Code Status: FULL Family Communication: Son at bedside MDM and disposition Plan: The below labs and imaging reports were reviewed and summarized above.  Patient was admitted with an open humerus fracture, went to the OR on July 3, uncomplicated surgery.  Postoperatively, she has mild anemia, elevated troponin with normal echocardiogram.   We will plan on outpatient stress testing, likely to rehab tomorrow.    Consultants:   Orthopedics  Procedures:  7/3 OPEN REDUCTION INTERNAL FIXATION (ORIF) PROXIMAL HUMERUS FRACTURE (Left) IRRIGATION AND DEBRIDEMENT EXTREMITY (Left)   Echocardiogram Study Conclusions  - Left ventricle: The cavity size was normal. Wall thickness was   increased in a pattern of moderate LVH. Systolic function was   normal. The estimated ejection fraction was in the range of 50%   to 55%. The study was not technically sufficient to allow   evaluation of LV diastolic dysfunction due to atrial   fibrillation. - Aortic valve: Moderately calcified annulus. Trileaflet;   moderately thickened leaflets. Valve area (VTI): 2.53 cm^2. Valve   area (Vmax): 2.53 cm^2. Valve area (Vmean): 2.61 cm^2. - Mitral valve: Mildly calcified annulus. Mildly thickened leaflets   . There was mild to moderate regurgitation. - Left atrium: The atrium was severely dilated. - Right ventricle: The cavity size was mildly dilated. - Right atrium: The atrium was moderately dilated. - Tricuspid valve: There was moderate regurgitation. - Pulmonary arteries: Systolic pressure was moderately increased.   PA peak pressure: 58 mm Hg (S). - Technically difficult study.  Antimicrobials:   None    Subjective: Somewhat confused, still with pain in left arm, moving hand well.  No chest pain, dyspnea, vomiting, cough, fever.  Objective: Vitals:   08/16/17 1917 08/17/17 0428 08/17/17 1527 08/17/17 1529  BP: 113/65 (!) 132/48  (!) 117/44  Pulse: (!) 119 60 63 62  Resp: 15 13  Temp: 98.1 F (36.7 C) 98.4 F (36.9 C) 98 F (36.7 C)   TempSrc: Oral Oral Oral   SpO2: 95% 94% 96% 97%  Weight:      Height:        Intake/Output Summary (Last 24 hours) at 08/17/2017 1546 Last data filed at 08/17/2017 1425 Gross per 24 hour  Intake 596 ml  Output 1150 ml  Net -554 ml   Filed Weights   08/15/17 1429  Weight: 45.4 kg (100 lb)     Examination: General appearance: Thin elderly female, lying in bed, no acute distress, interactive, tired   HEENT: Anicteric, conjunctival pink, lids and lashes normal.  No nasal deformity, discharge, or epistaxis.  Lips moist, teeth normal, oropharynx dry, no oral lesions, hearing normal. Skin: Warm and dry, no jaundice, no suspicious rashes or lesions, the incision is covered with a wrap. Cardiac: Rate regular, no murmurs, 1+ lower extremity edema. Respiratory: Normal respiratory effort, lungs clear without rales or wheezes. Abdomen: Soft without tenderness to palpation, ascites, distention, or hepatospleno megaly.   MSK: No deformities or effusions other than left arm swelling and in sling. Neuro: Patient is awake and alert, extraocular movements intact, cranial nerves III through XII intact.  Right arm strength normal, left arm in sling speech fluent.    Psych: Sensorium intact and responding to questions, oriented to hospital, otherwise somewhat attention lacking and confused.    Data Reviewed: I have personally reviewed following labs and imaging studies:  CBC: Recent Labs  Lab 08/14/17 0103 08/15/17 1535 08/16/17 0448 08/17/17 0345  WBC 14.3* 8.8 10.4 7.2  NEUTROABS 12.3* 6.9  --   --   HGB 9.9* 9.9* 9.0* 7.4*  HCT 31.6* 31.3* 29.2* 23.3*  MCV 100.3* 100.6* 100.7* 100.4*  PLT 228 210 172 324   Basic Metabolic Panel: Recent Labs  Lab 08/14/17 0103 08/15/17 1535 08/16/17 0448 08/17/17 0345  NA 139 140 139 138  K 4.3 4.6 4.8 4.5  CL 107 104 105 109  CO2 24 24 24  21*  GLUCOSE 171* 115* 109* 95  BUN 24* 28* 27* 22  CREATININE 1.66* 1.85* 1.67* 1.35*  CALCIUM 8.8* 8.8* 8.3* 7.5*   GFR: Estimated Creatinine Clearance: 19.1 mL/min (A) (by C-G formula based on SCr of 1.35 mg/dL (H)). Liver Function Tests: No results for input(s): AST, ALT, ALKPHOS, BILITOT, PROT, ALBUMIN in the last 168 hours. No results for input(s): LIPASE, AMYLASE in the last 168 hours. No  results for input(s): AMMONIA in the last 168 hours. Coagulation Profile: Recent Labs  Lab 08/15/17 1815  INR 1.22   Cardiac Enzymes: Recent Labs  Lab 08/15/17 1923 08/16/17 0448  TROPONINI 0.10* 0.09*   BNP (last 3 results) No results for input(s): PROBNP in the last 8760 hours. HbA1C: No results for input(s): HGBA1C in the last 72 hours. CBG: No results for input(s): GLUCAP in the last 168 hours. Lipid Profile: No results for input(s): CHOL, HDL, LDLCALC, TRIG, CHOLHDL, LDLDIRECT in the last 72 hours. Thyroid Function Tests: No results for input(s): TSH, T4TOTAL, FREET4, T3FREE, THYROIDAB in the last 72 hours. Anemia Panel: Recent Labs    08/17/17 0758  VITAMINB12 1,996*   Urine analysis:    Component Value Date/Time   COLORURINE YELLOW 08/14/2017 0121   APPEARANCEUR CLEAR 08/14/2017 0121   LABSPEC 1.009 08/14/2017 0121   PHURINE 5.0 08/14/2017 0121   GLUCOSEU NEGATIVE 08/14/2017 0121   HGBUR NEGATIVE 08/14/2017 0121   BILIRUBINUR NEGATIVE 08/14/2017 0121   KETONESUR NEGATIVE 08/14/2017  [place] 08/14/2017 0121   NITRITE NEGATIVE 08/14/2017 0121   LEUKOCYTESUR NEGATIVE 08/14/2017 0121   Sepsis Labs: @LABRCNTIP (procalcitonin:4,lacticacidven:4)  ) Recent Results (from the past 240 hour(s))  MRSA PCR Screening     Status: None   Collection Time: 08/15/17  6:57 PM  Result Value Ref Range Status   MRSA by PCR NEGATIVE NEGATIVE Final    Comment:        The GeneXpert MRSA Assay (FDA approved for NASAL specimens only), is one component of a comprehensive MRSA colonization surveillance program. It is not intended to diagnose MRSA infection nor to guide or monitor treatment for MRSA infections. Performed at [facility], [place] [address] [place], [place] [address]          Radiology Studies: Dg Humerus Left  Result Date: 08/16/2017 CLINICAL DATA:  Fracture left humeral shaft. EXAM: LEFT HUMERUS - 2+ VIEW COMPARISON:   Intraoperative fluoroscopy 08/15/2017. Left humerus 08/14/2017 FINDINGS: Postoperative changes with plate and screw fixation of a fracture of the midshaft left humerus. Fracture fragments appear well approximated. Alignment and position appears anatomic. Skin clips consistent with recent surgery. No focal bone lesion or bone destruction. IMPRESSION: Anatomic alignment and position of fractures of the midshaft left humerus after plate and screw fixation. Electronically Signed   By: Lucienne Capers M.D.   On: 08/16/2017 01:20   Dg Humerus Left  Result Date: 08/15/2017 CLINICAL DATA:  ORIF left humerus EXAM: LEFT HUMERUS - 2+ VIEW; DG C-ARM 61-120 MIN COMPARISON:  08/14/2017 FINDINGS: Three low resolution intraoperative spot views of the left humerus. Total fluoroscopy time was 11 seconds. Images demonstrate surgical plate and screw fixation of mid humerus fracture with anatomic alignment IMPRESSION: Intraoperative fluoroscopic assistance provided during surgical fixation of left humerus fracture. Electronically Signed   By: Donavan Foil M.D.   On: 08/15/2017 23:24   Dg C-arm 1-60 Min  Result Date: 08/15/2017 CLINICAL DATA:  ORIF left humerus EXAM: LEFT HUMERUS - 2+ VIEW; DG C-ARM 61-120 MIN COMPARISON:  08/14/2017 FINDINGS: Three low resolution intraoperative spot views of the left humerus. Total fluoroscopy time was 11 seconds. Images demonstrate surgical plate and screw fixation of mid humerus fracture with anatomic alignment IMPRESSION: Intraoperative fluoroscopic assistance provided during surgical fixation of left humerus fracture. Electronically Signed   By: Donavan Foil M.D.   On: 08/15/2017 23:24        Scheduled Meds: . brimonidine  1 drop Both Eyes BID  . carvedilol  6.25 mg Oral BID WC  . docusate sodium  100 mg Oral BID  . furosemide  20 mg Oral Once per day on Mon Fri  . irbesartan  150 mg Oral Daily  . vitamin B-12  1,000 mcg Oral BID   Continuous Infusions:    LOS: 2 days     Time spent: 25 minutes    Edwin Dada, MD [facility] 08/17/2017, 3:46 PM     Pager [phone] --- please page though AMION:  [email or web address] If 7PM-7AM, please contact night-coverage

## 2017-08-17 NOTE — Discharge Instructions (Signed)
Orthopedic discharge instructions:  - Maintain your sling unless doing her daily exercises or working with therapy. - No weightbearing to the left arm. - He may remove your postoperative bandages on postoperative day #5.  The wound should be then covered with dry dressing to be changed once daily. - For the prevention of blood clots used a full strength aspirin once daily for 4 weeks. - 4 mild to moderate pain use Tylenol and/or Motrin as directed.  Apply ice directly to the left arm as needed throughout the day for 20-30 minutes at a time. - Return to see Dr. Stann Mainland in 2 weeks for wound check.     You have a Stress Test scheduled at [place]. Your doctor has ordered this test to check the blood flow in your heart arteries.  Please arrive 15 minutes early for paperwork. The whole test will take several hours. You may want to bring reading material to remain occupied while undergoing different parts of the test.  Instructions:  No food/drink after midnight the night before.  It is OK to take your morning meds with a sip of water EXCEPT for those types of medicines listed below or otherwise instructed.  No caffeine/decaf products 24 hours before, including medicines such as Excedrin or Goody Powders. Call if there are any questions.   Wear comfortable clothes and shoes.   Special Medication Instructions:  Beta blockers such as metoprolol (Lopressor/Toprol XL), atenolol (Tenormin), carvedilol (Coreg), nebivolol (Bystolic), bisoprolol (Zebeta), propranolol (Inderal) should not be taken for 24 hours before the test.  Calcium channel blockers such as diltiazem (Cardizem) or verapmil (Calan) should not be taken for 24 hours before the test.  Remove nitroglycerin patches and do not take nitrate preparations such as Imdur/isosorbide the day of your test.  No Persantine/Theophylline or Aggrenox medicines should be used within 24 hours of the test.   If you are  diabetic, please ask which medications to hold the day of the test.  What To Expect: When you arrive in the lab, the technician will inject a small amount of radioactive tracer into your arm through an IV while you are resting quietly. This helps [place] to form pictures of your heart. You will likely only feel a sting from the IV. After a waiting period, resting pictures will be obtained under a big camera. These are the "before" pictures.  Next, you will be prepped for the stress portion of the test. This may include either walking on a treadmill or receiving a medicine that helps to dilate blood vessels in your heart to simulate the effect of exercise on your heart. If you are walking on a treadmill, you will walk at different paces to try to get your heart rate to a goal number that is based on your age. If your doctor has chosen the pharmacologic test, then you will receive a medicine through your IV that may cause temporary nausea, flushing, shortness of breath and sometimes chest discomfort or vomiting. This is typically short-lived and usually resolves quickly. If you experience symptoms, that does not automatically mean the test is abnormal. Some patients do not experience any symptoms at all. Your blood pressure and heart rate will be monitored, and we will be watching your EKG on a computer screen for any changes. During this portion of the test, the radiologist will inject another small amount of radioactive tracer into your IV. After a waiting period, you will undergo a second set of pictures. These are  the "after" pictures.  The doctor reading the test will compare the before-and-after images to look for evidence of heart blockages or heart weakness. The test usually takes 1 day to complete, but in certain instances (for example, if a patient is over a certain weight limit), the test may be done over the span of 2 days.

## 2017-08-17 NOTE — Clinical Social Work Note (Signed)
Clinical Social Work Assessment  Patient Details  Name: Holly Bolton MRN: [id number] Date of Birth: 03/21/1922  Date of referral:  08/17/17               Reason for consult:  Facility Placement                Permission sought to share information with:    Permission granted to share information::  Yes, Verbal Permission Granted  Name::     Engineer, water::  SNF  Relationship::     Contact Information:     Housing/Transportation Living arrangements for the past 2 months:  Single Family Home Source of Information:  Patient, Adult Children Patient Interpreter Needed:  None Criminal Activity/Legal Involvement Pertinent to Current Situation/Hospitalization:  No - Comment as needed Significant Relationships:  Adult Children, Other Family Members Lives with:  Adult Children, Self Do you feel safe going back to the place where you live?  No Need for family participation in patient care:  Yes (Comment)  Care giving concerns:  Pt from home with son and will need SNF at discharge.  Social Worker assessment / plan:  CSW met with patient and son at bedside to discuss SNF. No experience with SNF. Son agreeable to placement and would like CSW to send referral to [facility] and [facility]. CSW explained SNF placement and process. CSW will f/u on SNF offers.  CSW will f/u on disposition.  Employment status:  Retired Forensic scientist:  [employer] PT Recommendations:  [place] / Referral to community resources:  [place]  Patient/Family's Response to care:  Contractor.  Patient/Family's Understanding of and Emotional Response to Diagnosis, Current Treatment, and Prognosis:  Patient/family has good understanding of impairment and pt in good emotional state with disposition.  Pt desires to complete rehabilitative therapies and then return home with son.   Emotional Assessment Appearance:  Appears stated  age Attitude/Demeanor/Rapport:  (Cooperative) Affect (typically observed):  Accepting, Appropriate Orientation:  Oriented to Self, Oriented to Place, Oriented to Situation Alcohol / Substance use:  Not Applicable Psych involvement (Current and /or in the community):  No (Comment)  Discharge Needs  Concerns to be addressed:  Discharge Planning Concerns Readmission within the last 30 days:  No Current discharge risk:  Dependent with Mobility, Physical Impairment Barriers to Discharge:  No Barriers Identified   Normajean Baxter, LCSW 08/17/2017, 2:09 PM

## 2017-08-17 NOTE — Progress Notes (Signed)
Physical Therapy Treatment Patient Details Name: Holly Bolton MRN: [id number] DOB: 08-19-1922 Today's Date: 08/17/2017    History of Present Illness Holly Bolton is a 84 y.o. female with a history of asthma, hyperlipidemia, hypertension, history of pacemaker placement in 2010 with generator change in April 2019, peripheral arterial disease, history of diastolic dysfunction, hypertension, hyperlipidemia, brought to the emergency department from her Orthopedic appointment for evaluation and surgery for a L humeral fracture she sustained when taking her garbage out two days before (in ED on Monday she was deemed not a surgical candidate); now s/p ORIF L humeral fracture.    PT Comments    Pt presented supine in bed with HOB elevated, awake and willing to participate in therapy session. Pt making slow progress with functional mobility. She was able to tolerate short distance ambulation within room with mod A x2. Pt would continue to benefit from skilled physical therapy services at this time while admitted and after d/c to address the below listed limitations in order to improve overall safety and independence with functional mobility.   Follow Up Recommendations  SNF     Equipment Recommendations  Other (comment)(defer to next venue)    Recommendations for Other Services       Precautions / Restrictions Precautions Precautions: Fall Required Braces or Orthoses: Sling Restrictions Weight Bearing Restrictions: Yes LUE Weight Bearing: Non weight bearing Other Position/Activity Restrictions: L UE shoulder PROM only, AROM hand wrist, elbow; all movement as tolerated     Mobility  Bed Mobility Overal bed mobility: Needs Assistance Bed Mobility: Supine to Sit     Supine to sit: Mod assist     General bed mobility comments: increased time and effort, pt able to move bilateral LEs off of bed, assistance needed for trunk elevation  Transfers Overall transfer level: Needs  assistance Equipment used: 2 person hand held assist Transfers: Sit to/from Stand Sit to Stand: Mod assist;+2 safety/equipment         General transfer comment: increased time and effort, cueing for technique, assist to power into standing from EOB  Ambulation/Gait Ambulation/Gait assistance: Mod assist;+2 physical assistance;+2 safety/equipment Gait Distance (Feet): 15 Feet Assistive device: 1 person hand held assist Gait Pattern/deviations: Step-through pattern;Decreased step length - right;Decreased step length - left;Decreased stride length;Shuffle;Narrow base of support;Trunk flexed Gait velocity: decreased Gait velocity interpretation: <1.31 ft/sec, indicative of household ambulator General Gait Details: pt with very short step length bilaterally; shuffling, cautious gait requiring constant physical assistance for stability   Stairs             Wheelchair Mobility    Modified Rankin (Stroke Patients Only)       Balance Overall balance assessment: Needs assistance Sitting-balance support: Feet supported Sitting balance-Leahy Scale: Fair Sitting balance - Comments: kyphotic   Standing balance support: Single extremity supported;During functional activity Standing balance-Leahy Scale: Poor Standing balance comment: pt with heavy posterior lean initially in standing that required mod A x2                            Cognition Arousal/Alertness: Awake/alert Behavior During Therapy: WFL for tasks assessed/performed Overall Cognitive Status: Within Functional Limits for tasks assessed                                        Exercises      General Comments  Pertinent Vitals/Pain Pain Assessment: Faces Faces Pain Scale: Hurts little more Pain Location: L UE  Pain Descriptors / Indicators: Aching;Grimacing;Guarding Pain Intervention(s): Monitored during session;Repositioned    Home Living                       Prior Function            PT Goals (current goals can now be found in the care plan section) Acute Rehab PT Goals PT Goal Formulation: With patient Time For Goal Achievement: 08/30/17 Potential to Achieve Goals: Good Progress towards PT goals: Progressing toward goals    Frequency    Min 2X/week      PT Plan Current plan remains appropriate    Co-evaluation              AM-PAC PT "6 Clicks" Daily Activity  Outcome Measure  Difficulty turning over in bed (including adjusting bedclothes, sheets and blankets)?: Unable Difficulty moving from lying on back to sitting on the side of the bed? : Unable Difficulty sitting down on and standing up from a chair with arms (e.g., wheelchair, bedside commode, etc,.)?: Unable Help needed moving to and from a bed to chair (including a wheelchair)?: A Lot Help needed walking in hospital room?: A Lot Help needed climbing 3-5 steps with a railing? : A Lot 6 Click Score: 9    End of Session Equipment Utilized During Treatment: Gait belt;Other (comment)(L UE sling) Activity Tolerance: Patient tolerated treatment well Patient left: in chair;with call bell/phone within reach;with chair alarm set Nurse Communication: Mobility status PT Visit Diagnosis: Unsteadiness on feet (R26.81);Other abnormalities of gait and mobility (R26.89);History of falling (Z91.81);Pain Pain - Right/Left: Left Pain - part of body: Shoulder     Time: 8921-1941 PT Time Calculation (min) (ACUTE ONLY): 13 min  Charges:  $Therapeutic Activity: 8-22 mins                    G Codes:       [place], [place], [place] 08/17/2017, 1:56 PM

## 2017-08-17 NOTE — Care Management Important Message (Signed)
Important Message  Patient Details  Name: Holly Bolton MRN: [id number] Date of Birth: 1922-11-04   Medicare Important Message Given:  Yes    [name] 08/17/2017, 4:14 PM

## 2017-08-17 NOTE — Progress Notes (Signed)
Occupational Therapy Treatment Patient Details Name: Holly Bolton MRN: [id number] DOB: February 12, 1923 Today's Date: 08/17/2017    History of present illness Holly Bolton is a 84 y.o. female with a history of asthma, hyperlipidemia, hypertension, history of pacemaker placement in 2010 with generator change in April 2019, peripheral arterial disease, history of diastolic dysfunction, hypertension, hyperlipidemia, brought to the emergency department from her Orthopedic appointment for evaluation and surgery for a L humeral fracture she sustained when taking her garbage out two days before (in ED on Monday she was deemed not a surgical candidate); now s/p ORIF L humeral fracture.   OT comments  Patient progressing well, in better spirits today.  Demonstrates increased AROM of L elbow today, improved pain control, but weakness noted (fatigues with reps of elbow flexion). Reports "trouble with my memory" and does not recall prior education on ROM or WB restrictions; therapist reviewed again.  Provided HEP program of AROM elbow, wrist, hand exercises. Plan to teach PROM or SROM exercises at next session. Completed toilet transfer to 3:1 with moderate assistance and toileting with maximal assistance.  Patient continues to require modA to ascend, verbal and tactile cueing to correct posterior lean upon initially standing.  Will continue to follow while admitted.     Follow Up Recommendations  SNF;Supervision/Assistance - 24 hour    Equipment Recommendations  3 in 1 bedside commode    Recommendations for Other Services      Precautions / Restrictions Precautions Precautions: Fall Required Braces or Orthoses: Sling Restrictions Weight Bearing Restrictions: Yes LUE Weight Bearing: Non weight bearing Other Position/Activity Restrictions: L UE shoulder PROM only, AROM hand wrist, elbow; all movement as tolerated        Mobility Bed Mobility Overal bed mobility: Needs Assistance Bed Mobility: Supine  to Sit     Supine to sit: Mod assist     General bed mobility comments: seated in recliner upon therapist entry  Transfers Overall transfer level: Needs assistance Equipment used: 1 person hand held assist Transfers: Sit to/from Stand Sit to Stand: Mod assist Stand pivot transfers: Mod assist       General transfer comment: increased time and effort, min A for forward scoot; cueing for technique and safety, assist to ascend into standing     Balance Overall balance assessment: Needs assistance Sitting-balance support: Feet supported Sitting balance-Leahy Scale: Fair Sitting balance - Comments: kyphotic   Standing balance support: Single extremity supported;During functional activity Standing balance-Leahy Scale: Poor Standing balance comment: posterior lean initally with each sit to stand, cueing for forward position to correct; reliant on 1 UE support                           ADL either performed or assessed with clinical judgement   ADL Overall ADL's : Needs assistance/impaired                         Toilet Transfer: Moderate assistance;Cueing for safety;Cueing for sequencing;Stand-pivot;BSC Toilet Transfer Details (indicate cue type and reason): cueing for safety, hand placement and sequencing; inital posterior lean upon standing   Toileting- Clothing Manipulation and Hygiene: Maximal assistance;Cueing for sequencing;Cueing for safety;Sit to/from stand Toileting - Clothing Manipulation Details (indicate cue type and reason): seated hygiene spv, assist standing clothing mgmt up and down (able to manage R side standing up with minA to maintain bala)     Functional mobility during ADLs: Moderate assistance;Cueing for safety;Cueing for  sequencing(stand pivot only) General ADL Comments: improved affect today     Vision       Perception     Praxis      Cognition Arousal/Alertness: Awake/alert Behavior During Therapy: WFL for tasks  assessed/performed Overall Cognitive Status: Impaired/Different from baseline Area of Impairment: Memory                     Memory: Decreased recall of precautions;Decreased short-term memory                  Exercises Exercises: Shoulder Shoulder Exercises Shoulder Flexion: PROM;10 reps;Seated;Left(~80 degrees ) Shoulder ABduction: PROM;10 reps;Left;Seated(~15 degrees ) Elbow Flexion: AAROM;10 reps;Left;Seated(increased AROM, fatigue easily; 3-/5 MMT ) Elbow Extension: AAROM;10 reps;Left;Seated(increased AROM and tolerance, limited by pain ) Wrist Flexion: AROM;10 reps;Seated;Left Wrist Extension: AROM;Left;10 reps;Seated Digit Composite Flexion: AROM;Left;10 reps;Seated Composite Extension: AROM;Left;10 reps;Seated   Shoulder Instructions Shoulder Instructions Donning/doffing sling/immobilizer: Maximal assistance(verbally educated and demonstrated to family/friend) Correct positioning of sling/immobilizer: Maximal assistance ROM for elbow, wrist and digits of operated UE: Moderate assistance     General Comments son and neighbor were present throughout session    Pertinent Vitals/ Pain       Pain Assessment: Faces Faces Pain Scale: Hurts little more Pain Location: L UE  Pain Descriptors / Indicators: Aching;Grimacing;Guarding Pain Intervention(s): Monitored during session;Repositioned  Home Living                                          Prior Functioning/Environment              Frequency  Min 2X/week        Progress Toward Goals  OT Goals(current goals can now be found in the care plan section)  Progress towards OT goals: Progressing toward goals  Acute Rehab OT Goals Patient Stated Goal: heal and back to normal OT Goal Formulation: With patient/family Time For Goal Achievement: 08/30/17 Potential to Achieve Goals: Good  Plan Discharge plan remains appropriate;Frequency remains appropriate    Co-evaluation                  AM-PAC PT "6 Clicks" Daily Activity     Outcome Measure   Help from another person eating meals?: A Little Help from another person taking care of personal grooming?: A Little Help from another person toileting, which includes using toliet, bedpan, or urinal?: A Lot Help from another person bathing (including washing, rinsing, drying)?: A Lot Help from another person to put on and taking off regular upper body clothing?: A Lot Help from another person to put on and taking off regular lower body clothing?: A Lot 6 Click Score: 14    End of Session Equipment Utilized During Treatment: Gait belt(sling)  OT Visit Diagnosis: Other abnormalities of gait and mobility (R26.89);Muscle weakness (generalized) (M62.81);Pain Pain - Right/Left: Left Pain - part of body: Arm;Shoulder   Activity Tolerance Patient tolerated treatment well   Patient Left in chair;with call bell/phone within reach;with chair alarm set;with family/visitor present   Nurse Communication          Time: 7793-9030 OT Time Calculation (min): 45 min  Charges: OT General Charges $OT Visit: 1 Visit OT Treatments $Self Care/Home Management : 23-37 mins $Therapeutic Exercise: 8-22 mins  Delight Stare, OTR/L  Pager [place] 08/17/2017, 3:49 PM

## 2017-08-18 LAB — TYPE AND SCREEN
ABO/RH(D): B POS
Antibody Screen: NEGATIVE
Unit division: 0

## 2017-08-18 LAB — CBC
HCT: 31.2 % — ABNORMAL LOW (ref 36.0–46.0)
Hemoglobin: 10 g/dL — ABNORMAL LOW (ref 12.0–15.0)
MCH: 30.6 pg (ref 26.0–34.0)
MCHC: 32.1 g/dL (ref 30.0–36.0)
MCV: 95.4 fL (ref 78.0–100.0)
Platelets: 171 10*3/uL (ref 150–400)
RBC: 3.27 MIL/uL — ABNORMAL LOW (ref 3.87–5.11)
RDW: 19.4 % — ABNORMAL HIGH (ref 11.5–15.5)
WBC: 6.8 10*3/uL (ref 4.0–10.5)

## 2017-08-18 LAB — BASIC METABOLIC PANEL
Anion gap: 7 (ref 5–15)
BUN: 27 mg/dL — ABNORMAL HIGH (ref 8–23)
CO2: 25 mmol/L (ref 22–32)
Calcium: 8.3 mg/dL — ABNORMAL LOW (ref 8.9–10.3)
Chloride: 108 mmol/L (ref 98–111)
Creatinine, Ser: 1.51 mg/dL — ABNORMAL HIGH (ref 0.44–1.00)
GFR calc Af Amer: 33 mL/min — ABNORMAL LOW (ref >60)
GFR calc non Af Amer: 29 mL/min — ABNORMAL LOW (ref >60)
Glucose, Bld: 89 mg/dL (ref 70–99)
Potassium: 3.8 mmol/L (ref 3.5–5.1)
Sodium: 140 mmol/L (ref 135–145)

## 2017-08-18 LAB — BPAM RBC
Blood Product Expiration Date: 201907232359
ISSUE DATE / TIME: 201907051649
Unit Type and Rh: 7300

## 2017-08-18 LAB — TROPONIN I: Troponin I: 0.6 ng/mL (ref <0.03)

## 2017-08-18 NOTE — Plan of Care (Signed)

## 2017-08-18 NOTE — Progress Notes (Signed)
    Subjective: 3 Days Post-Op Procedure(s) (LRB): OPEN REDUCTION INTERNAL FIXATION (ORIF) PROXIMAL HUMERUS FRACTURE (Left) IRRIGATION AND DEBRIDEMENT EXTREMITY (Left) Patient reports pain as 3 on 0-10 scale.   Denies CP or SOB.  Voiding without difficulty. Positive flatus. Objective: Vital signs in last 24 hours: Temp:  [98 F (36.7 C)-98.8 F (37.1 C)] 98.3 F (36.8 C) (07/06 0336) Pulse Rate:  [57-71] 57 (07/06 0336) Resp:  [16] 16 (07/06 0336) BP: (117-156)/(44-89) 155/55 (07/06 0336) SpO2:  [93 %-97 %] 93 % (07/06 0336)  Intake/Output from previous day: 07/05 0701 - 07/06 0700 In: 1236 [P.O.:596; Blood:640] Out: 1000 [Urine:1000] Intake/Output this shift: No intake/output data recorded.  Labs: Recent Labs    08/15/17 1535 08/16/17 0448 08/17/17 0345  HGB 9.9* 9.0* 7.4*   Recent Labs    08/16/17 0448 08/17/17 0345  WBC 10.4 7.2  RBC 2.90* 2.32*  HCT 29.2* 23.3*  PLT 172 161   Recent Labs    08/16/17 0448 08/17/17 0345  NA 139 138  K 4.8 4.5  CL 105 109  CO2 24 21*  BUN 27* 22  CREATININE 1.67* 1.35*  GLUCOSE 109* 95  CALCIUM 8.3* 7.5*   Recent Labs    08/15/17 1815  INR 1.22    Physical Exam: Neurologically intact Intact pulses distally Incision: dressing C/D/I Compartment soft Body mass index is 18.29 kg/m.   Assessment/Plan: 3 Days Post-Op Procedure(s) (LRB): OPEN REDUCTION INTERNAL FIXATION (ORIF) PROXIMAL HUMERUS FRACTURE (Left) IRRIGATION AND DEBRIDEMENT EXTREMITY (Left) Discharge to SNF  Stable overall    Jilleen Essner D for Dr. Melina Schools [facility] [phone] 08/18/2017, 7:28 AM

## 2017-08-18 NOTE — Clinical Social Work Note (Signed)
CSW provided patient and son with bed offers. Patient's son was under the impression that patient would get 3-4 hours of therapy per day. CSW explained that only [facility] provides that level of care and SNF was recommended for 1 hour of therapy per day. Patient's son stated he feels "blindsighted" and wants to speak with patient's orthopedic doctor about this. Patient's son will review bed offers and call CSW with decision. MD notified.  Dayton Scrape, [place]

## 2017-08-18 NOTE — Progress Notes (Signed)
CRITICAL VALUE ALERT  Critical Value:  Troponin I 0.60  /Date & Time Notied:  08/18/2017 09:53  Provider Notified: Dr. Loleta Books  Orders Received/Actions taken: MD notified, awaiting orders.

## 2017-08-18 NOTE — Progress Notes (Signed)
PROGRESS NOTE    Holly Bolton  ZDG:[id number] DOB: [birth year] DOA: 08/15/2017 PCP: Jani Gravel, MD      Brief Narrative:  Holly Bolton is a 84 y.o. F with HTN, SSS with pacer, history of AV repair, pig valve as well as LIMA to LAD not for atherosclerosis, chronic normocytic anemia and CKD III who presents with fall and open left humeral fracture.      Assessment & Plan:  Open left humerus fracture Went to OR on 7/3 with Dr. Alvan Dame.  Uncomplicated surgery. -Continue acetaminophen/ tramadol for pain -Non-weight bearing on LUE -PT recommend SNF   Hypertension BP controlled.  -continue beta-blocker, dose adjusted  -Continue Benicar  -continue furosemide   SSS status post pacer   Echocardiogram  Dictation suggest patient was in atrial fibrillation.  She has been on continuous telemetry monitoring hospital, no atrial fibrillations been noted.  She had frequent periods of bigeminy, and PVCs  But I did not appreciate atrial fibrillation. -continue beta-blocker  Elevated troponin The patient is without symptoms of chest pain, tightness, dyspnea, or exertional intolerance.  Troponins minimally elevated post-operatively, and slightly more alert today in setting of anemia, CrCl 17.   Had been discussed with Cardiology yesterday, after echo and first troponins, since asymptomatic, and echo with no change in EF, no RWMA -- doubt ACS.    Recommend NM stress as outpatient.  However, patient had a PERSANTINE stress in 2011 that made her feel like she was "going to die", and family and patient choose to defer stress testing for now. -Will plan for outpatient Cardiology follow up in 2 weeks, to discuss Lexiscan stress testing  Chronic normocytic anemia Transfused 1 unit yesterday, Hgb up to 10 today.  In light of elevated troponin/cardiac disease, I recommend a transfusion threshold of 8 g/dL, and will transfuse 1 unit tonight.  MCV elevated.  B12 elevatd.  Folate pending  CKD stage III Cr  stable at baseline.  GFR is 25-35.        DVT prophylaxis: SCDs Code Status: FULL Family Communication: Son at bedside MDM and disposition Plan:  Blood labs imaging reports reviewed summarized above.    The patient is admitted with probably [facility] fracture, to the OR on July 3rd, had an uncomplicated surgery.    Postoperatively, she has had mild anemia, requiring 1 unit transfusion.  She has had an elevated troponin, but this does not appear to be ACS, her echocardiogram is normal, with planned outpatient stress testing in conjunction with her cardiologist.    To rehab when bed available.   Consultants:   Orthopedics  Procedures:  7/3 OPEN REDUCTION INTERNAL FIXATION (ORIF) PROXIMAL HUMERUS FRACTURE (Left) IRRIGATION AND DEBRIDEMENT EXTREMITY (Left)   Echocardiogram Study Conclusions  - Left ventricle: The cavity size was normal. Wall thickness was   increased in a pattern of moderate LVH. Systolic function was   normal. The estimated ejection fraction was in the range of 50%   to 55%. The study was not technically sufficient to allow   evaluation of LV diastolic dysfunction due to atrial   fibrillation. - Aortic valve: Moderately calcified annulus. Trileaflet;   moderately thickened leaflets. Valve area (VTI): 2.53 cm^2. Valve   area (Vmax): 2.53 cm^2. Valve area (Vmean): 2.61 cm^2. - Mitral valve: Mildly calcified annulus. Mildly thickened leaflets   . There was mild to moderate regurgitation. - Left atrium: The atrium was severely dilated. - Right ventricle: The cavity size was mildly dilated. - Right atrium: The atrium was  moderately dilated. - Tricuspid valve: There was moderate regurgitation. - Pulmonary arteries: Systolic pressure was moderately increased.   PA peak pressure: 58 mm Hg (S). - Technically difficult study.  Antimicrobials:   None    Subjective: Somewhat confused, still with pain in left arm, moving hand well.  No chest pain, dyspnea,  vomiting, cough, fever.  Objective: Vitals:   08/17/17 1714 08/17/17 2010 08/18/17 0336 08/18/17 1502  BP: (!) 151/66 (!) 156/49 (!) 155/55 (!) 146/58  Pulse: (!) 59 71 (!) 57 60  Resp: 16 16 16    Temp: 98.5 F (36.9 C) 98.8 F (37.1 C) 98.3 F (36.8 C) 98.3 F (36.8 C)  TempSrc: Oral Oral Oral Oral  SpO2: 96% 95% 93% 96%  Weight:      Height:        Intake/Output Summary (Last 24 hours) at 08/18/2017 1708 Last data filed at 08/18/2017 1430 Gross per 24 hour  Intake 792 ml  Output 950 ml  Net -158 ml   Filed Weights   08/15/17 1429  Weight: 45.4 kg (100 lb)    Examination: General appearance:  Thin frail elderly female, lying in bed,   Rouses easily, no acute distress  HEENT:  Anicteric, conjunctiva pink, lids lashes normal, no nasal chronic, discharge, or epistaxis.  Lips normal, oropharynx tacky dry with no oral lesions, hearing diminished rate Skin: Warm and dry, no jaundice, no suspicious rashes or lesions, the incision is covered with a wrap. Cardiac:  Heart rate regular, no murmurs, no lower extremity edema. Respiratory:   Normal respiratory rate, lungs clear without rales or wheezes. Abdomen:   Abdomen soft without tenderness to palpation, ascites, distention, hepatosplenomegaly. MSK: No deformities or effusions other than left arm swelling and in sling. Neuro:   The patient is awake and alert, extraocular movements are intact, cranial nerves intact.  Speech is fluent.    Psych:   Sleeping but easily aroused.  Sensorium intact, oriented to hospital,  And self otherwise slowed oriented to date.  Oriented to son. Attention diminished, affect blunted.    Data Reviewed: I have personally reviewed following labs and imaging studies:  CBC: Recent Labs  Lab 08/14/17 0103 08/15/17 1535 08/16/17 0448 08/17/17 0345 08/18/17 0640  WBC 14.3* 8.8 10.4 7.2 6.8  NEUTROABS 12.3* 6.9  --   --   --   HGB 9.9* 9.9* 9.0* 7.4* 10.0*  HCT 31.6* 31.3* 29.2* 23.3* 31.2*  MCV  100.3* 100.6* 100.7* 100.4* 95.4  PLT 228 210 172 161 852   Basic Metabolic Panel: Recent Labs  Lab 08/14/17 0103 08/15/17 1535 08/16/17 0448 08/17/17 0345 08/18/17 0640  NA 139 140 139 138 140  K 4.3 4.6 4.8 4.5 3.8  CL 107 104 105 109 108  CO2 24 24 24  21* 25  GLUCOSE 171* 115* 109* 95 89  BUN 24* 28* 27* 22 27*  CREATININE 1.66* 1.85* 1.67* 1.35* 1.51*  CALCIUM 8.8* 8.8* 8.3* 7.5* 8.3*   GFR: Estimated Creatinine Clearance: 17 mL/min (A) (by C-G formula based on SCr of 1.51 mg/dL (H)). Liver Function Tests: No results for input(s): AST, ALT, ALKPHOS, BILITOT, PROT, ALBUMIN in the last 168 hours. No results for input(s): LIPASE, AMYLASE in the last 168 hours. No results for input(s): AMMONIA in the last 168 hours. Coagulation Profile: Recent Labs  Lab 08/15/17 1815  INR 1.22   Cardiac Enzymes: Recent Labs  Lab 08/15/17 1923 08/16/17 0448 08/18/17 0640  TROPONINI 0.10* 0.09* 0.60*   BNP (last 3 results)  No results for input(s): PROBNP in the last 8760 hours. HbA1C: No results for input(s): HGBA1C in the last 72 hours. CBG: No results for input(s): GLUCAP in the last 168 hours. Lipid Profile: No results for input(s): CHOL, HDL, LDLCALC, TRIG, CHOLHDL, LDLDIRECT in the last 72 hours. Thyroid Function Tests: No results for input(s): TSH, T4TOTAL, FREET4, T3FREE, THYROIDAB in the last 72 hours. Anemia Panel: Recent Labs    08/17/17 0758  VITAMINB12 1,996*   Urine analysis:    Component Value Date/Time   COLORURINE YELLOW 08/14/2017 0121   APPEARANCEUR CLEAR 08/14/2017 0121   LABSPEC 1.009 08/14/2017 0121   PHURINE 5.0 08/14/2017 0121   GLUCOSEU NEGATIVE 08/14/2017 0121   HGBUR NEGATIVE 08/14/2017 0121   BILIRUBINUR NEGATIVE 08/14/2017 0121   KETONESUR NEGATIVE 08/14/2017 0121   PROTEINUR NEGATIVE 08/14/2017 0121   NITRITE NEGATIVE 08/14/2017 0121   LEUKOCYTESUR NEGATIVE 08/14/2017 0121   Sepsis  Labs: @LABRCNTIP (procalcitonin:4,lacticacidven:4)  ) Recent Results (from the past 240 hour(s))  MRSA PCR Screening     Status: None   Collection Time: 08/15/17  6:57 PM  Result Value Ref Range Status   MRSA by PCR NEGATIVE NEGATIVE Final    Comment:        The GeneXpert MRSA Assay (FDA approved for NASAL specimens only), is one component of a comprehensive MRSA colonization surveillance program. It is not intended to diagnose MRSA infection nor to guide or monitor treatment for MRSA infections. Performed at [facility], [place] [address] [place], [place] [address]          Radiology Studies: No results found.      Scheduled Meds: . brimonidine  1 drop Both Eyes BID  . carvedilol  6.25 mg Oral BID WC  . docusate sodium  100 mg Oral BID  . furosemide  20 mg Oral Once per day on Mon Fri  . irbesartan  150 mg Oral Daily  . vitamin B-12  1,000 mcg Oral BID   Continuous Infusions:    LOS: 3 days    Time spent: 25 minutes    Edwin Dada, MD [facility] 08/18/2017, 5:08 PM     Pager [phone] --- please page though AMION:  [email or web address] Password [email or web address] If 7PM-7AM, please contact night-coverage

## 2017-08-19 DIAGNOSIS — Z9889 Other specified postprocedural states: Secondary | ICD-10-CM

## 2017-08-19 DIAGNOSIS — I10 Essential (primary) hypertension: Secondary | ICD-10-CM

## 2017-08-19 DIAGNOSIS — E785 Hyperlipidemia, unspecified: Secondary | ICD-10-CM

## 2017-08-19 DIAGNOSIS — Z8679 Personal history of other diseases of the circulatory system: Secondary | ICD-10-CM

## 2017-08-19 DIAGNOSIS — I2510 Atherosclerotic heart disease of native coronary artery without angina pectoris: Secondary | ICD-10-CM

## 2017-08-19 DIAGNOSIS — R748 Abnormal levels of other serum enzymes: Secondary | ICD-10-CM

## 2017-08-19 DIAGNOSIS — S42202B Unspecified fracture of upper end of left humerus, initial encounter for open fracture: Secondary | ICD-10-CM

## 2017-08-19 DIAGNOSIS — Z950 Presence of cardiac pacemaker: Secondary | ICD-10-CM

## 2017-08-19 LAB — CBC
HCT: 31.1 % — ABNORMAL LOW (ref 36.0–46.0)
Hemoglobin: 9.9 g/dL — ABNORMAL LOW (ref 12.0–15.0)
MCH: 30.7 pg (ref 26.0–34.0)
MCHC: 31.8 g/dL (ref 30.0–36.0)
MCV: 96.3 fL (ref 78.0–100.0)
Platelets: 184 10*3/uL (ref 150–400)
RBC: 3.23 MIL/uL — ABNORMAL LOW (ref 3.87–5.11)
RDW: 18.9 % — ABNORMAL HIGH (ref 11.5–15.5)
WBC: 7.1 10*3/uL (ref 4.0–10.5)

## 2017-08-19 LAB — BASIC METABOLIC PANEL
Anion gap: 3 — ABNORMAL LOW (ref 5–15)
BUN: 27 mg/dL — ABNORMAL HIGH (ref 8–23)
CO2: 27 mmol/L (ref 22–32)
Calcium: 8.3 mg/dL — ABNORMAL LOW (ref 8.9–10.3)
Chloride: 109 mmol/L (ref 98–111)
Creatinine, Ser: 1.43 mg/dL — ABNORMAL HIGH (ref 0.44–1.00)
GFR calc Af Amer: 36 mL/min — ABNORMAL LOW (ref >60)
GFR calc non Af Amer: 31 mL/min — ABNORMAL LOW (ref >60)
Glucose, Bld: 106 mg/dL — ABNORMAL HIGH (ref 70–99)
Potassium: 4.3 mmol/L (ref 3.5–5.1)
Sodium: 139 mmol/L (ref 135–145)

## 2017-08-19 MED ORDER — POLYETHYLENE GLYCOL 3350 17 G PO PACK
17.0000 g | PACK | Freq: Every day | ORAL | Status: DC
  Administered 2017-08-19: 17 g via ORAL
  Filled 2017-08-19: qty 1

## 2017-08-19 MED ORDER — SENNOSIDES-DOCUSATE SODIUM 8.6-50 MG PO TABS: 1.0000 | ORAL_TABLET | Freq: Every day | ORAL | Status: DC

## 2017-08-19 NOTE — Progress Notes (Signed)
PROGRESS NOTE    Holly Bolton  ZOX:[id number] DOB: [birth year] DOA: 08/15/2017 PCP: Jani Gravel, MD      Brief Narrative:  Holly Bolton is a 84 y.o. F with HTN, SSS with pacer, history of AV repair, pig valve as well as LIMA to LAD not for atherosclerosis, chronic normocytic anemia and CKD III who presents with fall and open left humeral fracture.      Assessment & Plan:  Open left humerus fracture Went to OR on 7/3 with Dr. Alvan Dame.  Uncomplicated surgery. -Continue acetaminophen/tramadol -Weight bearing as tolerate to LUE -PT recommend SNF   Hypertension BP controlled. -Continue BB, increased dose -Continue ARB -Continue furosemide  SSS status post pacer Echocardiogram dictation suggested patient was in atrial fibrillation.  She has been on continuous telemetry monitoring in the hospital, no atrial fibrillation has been noted.  She had frequent periods of bigeminy, and PVCs but I do not appreciate atrial fibrillation. -Continue BB  Elevated troponin The patient has not complained of chest pain, however, when probed, she has intermittently endorsed chest tightness.  She has had some dyspnea.    Troponins were minimally elevated post-operatively.   I discussed initially with her primary Cardiology Dr. Abelina Bachelor, who recommended outpatient Lexiscan, in light of normal echocardiogram.     However, repeat troponin yesterday bumped to 0.6 ng/mL.   -Consult to Cardiology re: need for nuclear testing in hospital, vs defer to outpatient setting  Chronic normocytic anemia Transfused 1 unit for Hgb 7.4 with troponin leak.   Hgb stable 10 today.  In light of elevated troponin/cardiac disease, I recommend a transfusion threshold of 8 g/dL, and will transfuse 1 unit tonight.  MCV elevated.  B12 elevatd.  Folate pending  CKD stage III Cr stable no change.  GFR is 25-35.         DVT prophylaxis: SCDs Code Status: FULL Family Communication: Son at bedside MDM and disposition  Plan: Labs and imaging reports were reviewed and summarized above.  The patient was admitted with an open humerus fracture, underwent ORIF, uncomplicated.  Postoperative course comp located by anemia requiring transfusion as well as elevated troponin, doubt ACS.  Will consult cardiology, to rehab when bed available.      Consultants:   Orthopedics  Procedures:  7/3 OPEN REDUCTION INTERNAL FIXATION (ORIF) PROXIMAL HUMERUS FRACTURE (Left) IRRIGATION AND DEBRIDEMENT EXTREMITY (Left)   Echocardiogram Study Conclusions  - Left ventricle: The cavity size was normal. Wall thickness was   increased in a pattern of moderate LVH. Systolic function was   normal. The estimated ejection fraction was in the range of 50%   to 55%. The study was not technically sufficient to allow   evaluation of LV diastolic dysfunction due to atrial   fibrillation. - Aortic valve: Moderately calcified annulus. Trileaflet;   moderately thickened leaflets. Valve area (VTI): 2.53 cm^2. Valve   area (Vmax): 2.53 cm^2. Valve area (Vmean): 2.61 cm^2. - Mitral valve: Mildly calcified annulus. Mildly thickened leaflets   . There was mild to moderate regurgitation. - Left atrium: The atrium was severely dilated. - Right ventricle: The cavity size was mildly dilated. - Right atrium: The atrium was moderately dilated. - Tricuspid valve: There was moderate regurgitation. - Pulmonary arteries: Systolic pressure was moderately increased.   PA peak pressure: 58 mm Hg (S). - Technically difficult study.  Antimicrobials:   None    Subjective: Some intermittent dyspnea last night, without cough, fever, sputum.  No chest pain, but some vague chest tightness  at rest.  No vomiting, diarrhea, in fact she is still very constipated.        Objective: Vitals:   08/18/17 1502 08/18/17 1957 08/19/17 0354 08/19/17 1100  BP: (!) 146/58 (!) 165/59 (!) 144/70   Pulse: 60 70 65   Resp:      Temp: 98.3 F (36.8 C) 98.1 F  (36.7 C) 98.1 F (36.7 C)   TempSrc: Oral Oral Oral   SpO2: 96% 95% 94% 96%  Weight:      Height:       No intake or output data in the 24 hours ending 08/19/17 1448 Filed Weights   08/15/17 1429  Weight: 45.4 kg (100 lb)    Examination: General appearance: Frail elderly female, sitting in recliner, no acute distress, interactive, appears weak and tired. HEENT: Terry, conjunctive are pink, lids and lashes normal, no nasal deformity, discharge, or epistaxis.  Lips normal, oropharynx tacky dry with no oral lesions.  Hearing is diminished. Skin: Warm and dry, no jaundice, no bruising of the fingers, incision is covered. Cardiac: Heart rate and rhythm regular, no murmurs, mild lower extremity edema. Respiratory: Normal respiratory rate and rhythm, lungs clear without rales or wheezes. Abdomen: Abdomen soft without tenderness to palpation, ascites, distention, hepatospleno megaly. MSK: Left arm in a sling, no other deformities or effusions.. Neuro:   Patient is awake and alert, extraocular movements intact, cranial nerves and symmetric, speech fluent, moves right arm weakly, left arm in sling. Psych: Oriented to come in hospital, not to the year or month.  Somewhat tangential.  Interactive.  Attention diminished, affect blunted.      Data Reviewed: I have personally reviewed following labs and imaging studies:  CBC: Recent Labs  Lab 08/14/17 0103 08/15/17 1535 08/16/17 0448 08/17/17 0345 08/18/17 0640 08/19/17 0536  WBC 14.3* 8.8 10.4 7.2 6.8 7.1  NEUTROABS 12.3* 6.9  --   --   --   --   HGB 9.9* 9.9* 9.0* 7.4* 10.0* 9.9*  HCT 31.6* 31.3* 29.2* 23.3* 31.2* 31.1*  MCV 100.3* 100.6* 100.7* 100.4* 95.4 96.3  PLT 228 210 172 161 171 559   Basic Metabolic Panel: Recent Labs  Lab 08/15/17 1535 08/16/17 0448 08/17/17 0345 08/18/17 0640 08/19/17 0536  NA 140 139 138 140 139  K 4.6 4.8 4.5 3.8 4.3  CL 104 105 109 108 109  CO2 24 24 21* 25 27  GLUCOSE 115* 109* 95 89 106*    BUN 28* 27* 22 27* 27*  CREATININE 1.85* 1.67* 1.35* 1.51* 1.43*  CALCIUM 8.8* 8.3* 7.5* 8.3* 8.3*   GFR: Estimated Creatinine Clearance: 18 mL/min (A) (by C-G formula based on SCr of 1.43 mg/dL (H)). Liver Function Tests: No results for input(s): AST, ALT, ALKPHOS, BILITOT, PROT, ALBUMIN in the last 168 hours. No results for input(s): LIPASE, AMYLASE in the last 168 hours. No results for input(s): AMMONIA in the last 168 hours. Coagulation Profile: Recent Labs  Lab 08/15/17 1815  INR 1.22   Cardiac Enzymes: Recent Labs  Lab 08/15/17 1923 08/16/17 0448 08/18/17 0640  TROPONINI 0.10* 0.09* 0.60*   BNP (last 3 results) No results for input(s): PROBNP in the last 8760 hours. HbA1C: No results for input(s): HGBA1C in the last 72 hours. CBG: No results for input(s): GLUCAP in the last 168 hours. Lipid Profile: No results for input(s): CHOL, HDL, LDLCALC, TRIG, CHOLHDL, LDLDIRECT in the last 72 hours. Thyroid Function Tests: No results for input(s): TSH, T4TOTAL, FREET4, T3FREE, THYROIDAB in the last  72 hours. Anemia Panel: Recent Labs    08/17/17 0758  VITAMINB12 1,996*   Urine analysis:    Component Value Date/Time   COLORURINE YELLOW 08/14/2017 0121   APPEARANCEUR CLEAR 08/14/2017 0121   LABSPEC 1.009 08/14/2017 0121   PHURINE 5.0 08/14/2017 0121   GLUCOSEU NEGATIVE 08/14/2017 0121   HGBUR NEGATIVE 08/14/2017 0121   BILIRUBINUR NEGATIVE 08/14/2017 0121   KETONESUR NEGATIVE 08/14/2017 0121   PROTEINUR NEGATIVE 08/14/2017 0121   NITRITE NEGATIVE 08/14/2017 0121   LEUKOCYTESUR NEGATIVE 08/14/2017 0121   Sepsis Labs: @LABRCNTIP (procalcitonin:4,lacticacidven:4)  ) Recent Results (from the past 240 hour(s))  MRSA PCR Screening     Status: None   Collection Time: 08/15/17  6:57 PM  Result Value Ref Range Status   MRSA by PCR NEGATIVE NEGATIVE Final    Comment:        The GeneXpert MRSA Assay (FDA approved for NASAL specimens only), is one component of  a comprehensive MRSA colonization surveillance program. It is not intended to diagnose MRSA infection nor to guide or monitor treatment for MRSA infections. Performed at [facility], [place] [address], [place], [place] [address]          Radiology Studies: No results found.      Scheduled Meds: . brimonidine  1 drop Both Eyes BID  . carvedilol  6.25 mg Oral BID WC  . docusate sodium  100 mg Oral BID  . furosemide  20 mg Oral Once per day on Mon Fri  . irbesartan  150 mg Oral Daily  . polyethylene glycol  17 g Oral Daily  . senna-docusate  1 tablet Oral QHS  . vitamin B-12  1,000 mcg Oral BID   Continuous Infusions:    LOS: 4 days    Time spent: 25 Minutes   Edwin Dada, MD [facility] 08/19/2017, 2:48 PM     Pager [phone] --- please page though AMION:  [email or web address] Password [email or web address] If 7PM-7AM, please contact night-coverage

## 2017-08-19 NOTE — Plan of Care (Signed)

## 2017-08-19 NOTE — Consult Note (Signed)
Cardiology Consultation:   Patient ID: Holly Bolton; [id number]; 06-27-1922   Admit date: 08/15/2017 Date of Consult: 08/19/2017  Primary Care Provider: Jani Gravel, MD Primary Cardiologist: No primary care provider on file.  Sanda Klein MD Primary Electrophysiologist: Sanda Klein MD   Patient Profile:   Holly Bolton is a 84 y.o. female with a hx of ascending aortic root replacement with bioprosthetic aortic valve and left internal mammary to LAD 1989 who is being seen today for the evaluation of minimally elevated troponin at the request of Myrene Buddy, MD..  History of Present Illness:   Ms. Holly Bolton underwent left arm surgery for fracture by Dr Victorino December on 08/15/2017.  On the day of surgery a troponin was ordered.  It was minimally elevated at 0.1.  The next day a subsequent troponin was ordered and was 0.09.  2 days later a troponin of 0.6 was obtained.  During this time the patient has had atypical chest discomfort that is poorly characterized.  She is ambulated today without any discomfort in the chest.  She denies shortness of breath.  Overall she feels relatively well other than left arm discomfort.  Past Medical History:  Diagnosis Date  . Aneurysm (arteriovenous) of coronary vessels    ascending aorta requiring bypass of the LAD  . Asthma   . Carotid stenosis 04/28/08   Doppler: <40% stenosis bilateral  . Hyperlipemia   . Hypertension   . Pacemaker generator end of life 11/18/08   Intermittent high-grade atrioventricular block  . Peripheral arterial disease (HCC)    left ABI of 0.78  . Presence of permanent cardiac pacemaker 08/26/01   Sinus node dysfunction-[facility]  . Status post ascending aortic aneurysm repair/AVR -  Medtronic Freestyle root 09/28/2014   ascending aorta requiring bypass of the LAD     Past Surgical History:  Procedure Laterality Date  . ABDOMINAL HYSTERECTOMY  1966  . APPENDECTOMY  1943  . ASCENDING AORTIC ANEURYSM REPAIR  09/22/97   porcine aortic root  . BREAST FIBROADENOMA SURGERY  11/89  . CARDIAC SURGERY    . CORONARY ARTERY BYPASS GRAFT  09/22/97   LIMA to the LAD  . I&D EXTREMITY Left 08/15/2017   Procedure: IRRIGATION AND DEBRIDEMENT EXTREMITY;  Surgeon: Nicholes Stairs, MD;  Location: [facility];  Service: Orthopedics;  Laterality: Left;  . [place]   left  . NEPHRECTOMY  1958   right  . ORIF HUMERUS FRACTURE Left 08/15/2017   Procedure: OPEN REDUCTION INTERNAL FIXATION (ORIF) PROXIMAL HUMERUS FRACTURE;  Surgeon: Nicholes Stairs, MD;  Location: [facility];  Service: Orthopedics;  Laterality: Left;  . OVARIAN CYST SURGERY  1948  . PACEMAKER GENERATOR CHANGE  11/18/08   [facility]  . PACEMAKER INSERTION  08/26/01   [facility]  . PPM GENERATOR CHANGEOUT N/A 05/16/2017   Procedure: PPM GENERATOR CHANGEOUT;  Surgeon: Sanda Klein, MD;  Location: [facility];  Service: Cardiovascular;  Laterality: N/A;     Home Medications:  Prior to Admission medications   Medication Sig Start Date End Date Taking? Authorizing Provider  aspirin 81 MG tablet Take 81 mg by mouth daily as needed for pain.    Yes [provider]  brimonidine (ALPHAGAN) 0.2 % ophthalmic solution instill ONE DROP IN [facility] EYE TWICE DAILY 03/31/16  Yes [provider]  carvedilol (COREG) 3.125 MG tablet Take 3.125 mg by mouth 2 (two) times daily with a meal. 06/05/16  Yes [provider]  Coenzyme Q10 (  COQ10) 200 MG CAPS Take 200 mg by mouth daily.   Yes [provider]  furosemide (LASIX) 20 MG tablet Take 1 tablet (20 mg total) by mouth 2 (two) times a week. Mondays and Fridays 08/13/17  Yes Croitoru, Mihai, MD  HYDROcodone-acetaminophen (NORCO/VICODIN) 5-325 MG tablet Take 1 tablet by mouth every 6 (six) hours as needed. 08/14/17  Yes Horton, Barbette Hair, MD  Magnesium 250 MG TABS Take 1 tablet (250 mg total) by mouth 2 (two) times daily. 12/15/15  Yes Croitoru, Mihai, MD  olmesartan (BENICAR) 40 MG tablet  Take 40 mg by mouth daily.   Yes [provider]  trolamine salicylate (ASPERCREME) 10 % cream Apply 1 application topically as needed for muscle pain.   Yes [provider]  vitamin B-12 (CYANOCOBALAMIN) 1000 MCG tablet Take 1,000 mcg by mouth 2 (two) times daily.    Yes [provider]    Inpatient Medications: Scheduled Meds: . brimonidine  1 drop Both Eyes BID  . carvedilol  6.25 mg Oral BID WC  . docusate sodium  100 mg Oral BID  . furosemide  20 mg Oral Once per day on Mon Fri  . irbesartan  150 mg Oral Daily  . polyethylene glycol  17 g Oral Daily  . senna-docusate  1 tablet Oral QHS  . vitamin B-12  1,000 mcg Oral BID   Continuous Infusions:  PRN Meds: acetaminophen, bisacodyl, metoCLOPramide **OR** metoCLOPramide (REGLAN) injection, morphine injection, ondansetron **OR** ondansetron (ZOFRAN) IV, sodium phosphate, traMADol  Allergies:    Allergies  Allergen Reactions  . Accupril [Quinapril Hcl] Other (See Comments)    Unknown reaction   . Amlodipine Swelling  . Benadryl [Diphenhydramine Hcl] Other (See Comments)    Unknown reaction   . Biaxin [Clarithromycin] Other (See Comments)    Unknown reaction   . Ciprofloxacin Other (See Comments)    Unknown reaction   . Codeine Other (See Comments)    Unknown reaction   . Diovan [Valsartan] Other (See Comments)    Unknown reaction   . Medrol [Methylprednisolone] Other (See Comments)    Unknown  . Morphine And Related Other (See Comments)    Unknown reaction   . Neurontin [Gabapentin] Other (See Comments)    Unknown reaction   . Penicillins     Unknown reaction  Has patient had a PCN reaction causing immediate rash, facial/tongue/throat swelling, SOB or lightheadedness with hypotension: Unknown Has patient had a PCN reaction causing severe rash involving mucus membranes or skin necrosis: Unknown Has patient had a PCN reaction that required hospitalization: Unknown Has patient had a PCN reaction  occurring within the last 10 years: No If all of the above answers are "NO", then may proceed with Cephalosporin use.     Social History:   Social History   Socioeconomic History  . Marital status: Widowed    Spouse name: Not on file  . Number of children: 1  . Years of education: 78  . Highest education level: Not on file  Occupational History  . Occupation: Retired  Scientific laboratory technician  . Financial resource strain: Not on file  . Food insecurity:    Worry: Not on file    Inability: Not on file  . Transportation needs:    Medical: Not on file    Non-medical: Not on file  Tobacco Use  . Smoking status: Never Smoker  . Smokeless tobacco: Never Used  Substance and Sexual Activity  . Alcohol use: No  . Drug use: No  .  Sexual activity: Not on file  Lifestyle  . Physical activity:    Days per week: Not on file    Minutes per session: Not on file  . Stress: Not on file  Relationships  . Social connections:    Talks on phone: Not on file    Gets together: Not on file    Attends religious service: Not on file    Active member of club or organization: Not on file    Attends meetings of clubs or organizations: Not on file    Relationship status: Not on file  . Intimate partner violence:    Fear of current or ex partner: Not on file    Emotionally abused: Not on file    Physically abused: Not on file    Forced sexual activity: Not on file  Other Topics Concern  . Not on file  Social History Narrative   Lives with son, Algis Greenhouse   Caffeine use: 1 cup coffee per day   Right handed   Family History:    Family History  Problem Relation Age of Onset  . Heart attack Mother   . Heart attack Father   . Heart attack Brother   . Hyperlipidemia Sister   . Hypertension Sister      ROS:  Please see the history of present illness.  Aching all over.  Remained prone on the ground for 3 hours until she was identified by a neighbor.  Denies dysuria, cough, orthopnea, and  palpitations. All other ROS reviewed and negative.     Physical Exam/Data:   Vitals:   08/18/17 1502 08/18/17 1957 08/19/17 0354 08/19/17 1100  BP: (!) 146/58 (!) 165/59 (!) 144/70   Pulse: 60 70 65   Resp:      Temp: 98.3 F (36.8 C) 98.1 F (36.7 C) 98.1 F (36.7 C)   TempSrc: Oral Oral Oral   SpO2: 96% 95% 94% 96%  Weight:      Height:        Intake/Output Summary (Last 24 hours) at 08/19/2017 1639 Last data filed at 08/19/2017 1500 Gross per 24 hour  Intake 220 ml  Output 1200 ml  Net -980 ml   Filed Weights   08/15/17 1429  Weight: 100 lb (45.4 kg)   Body mass index is 18.29 kg/m.  General:  Well nourished, well developed, in no acute distress, elderly, frail.  Pallor is also present. HEENT: normal Lymph: no adenopathy Neck: no JVD Endocrine:  No thryomegaly Vascular: No carotid bruits; FA pulses 2+ bilaterally without bruits  Cardiac:  normal S1, S2; RRR; no murmur. Lungs:  clear to auscultation bilaterally, no wheezing, rhonchi or rales  Abd: soft, nontender, no hepatomegaly  Ext: no edema Musculoskeletal:  No deformities, BUE and BLE strength normal and equal Skin: warm and dry  Neuro:  CNs 2-12 intact, no focal abnormalities noted Psych:  Normal affect   EKG:  The EKG was personally reviewed and demonstrates: Sinus rhythm, first-degree AV block, right bundle branch block, and left anterior hemiblock on 08/15/2017.  When compared to 08/14/2017 atrial sensing with ventricular pacing is no longer present. Telemetry:  Telemetry was personally reviewed and demonstrates: Atrial tracking with ventricular pacing is noted.  Relevant CV Studies: 2D Doppler echocardiogram 08/16/2017: Study Conclusions  - Left ventricle: The cavity size was normal. Wall thickness was   increased in a pattern of moderate LVH. Systolic function was   normal. The estimated ejection fraction was in the range of 50%  to 55%. The study was not technically sufficient to allow   evaluation of  LV diastolic dysfunction due to atrial   fibrillation. - Aortic valve: Moderately calcified annulus. Trileaflet;   moderately thickened leaflets. Valve area (VTI): 2.53 cm^2. Valve   area (Vmax): 2.53 cm^2. Valve area (Vmean): 2.61 cm^2. - Mitral valve: Mildly calcified annulus. Mildly thickened leaflets   . There was mild to moderate regurgitation. - Left atrium: The atrium was severely dilated. - Right ventricle: The cavity size was mildly dilated. - Right atrium: The atrium was moderately dilated. - Tricuspid valve: There was moderate regurgitation. - Pulmonary arteries: Systolic pressure was moderately increased.   PA peak pressure: 58 mm Hg (S). - Technically difficult study.  Laboratory Data:  Chemistry Recent Labs  Lab 08/17/17 0345 08/18/17 0640 08/19/17 0536  NA 138 140 139  K 4.5 3.8 4.3  CL 109 108 109  CO2 21* 25 27  GLUCOSE 95 89 106*  BUN 22 27* 27*  CREATININE 1.35* 1.51* 1.43*  CALCIUM 7.5* 8.3* 8.3*  GFRNONAA 33* 29* 31*  GFRAA 38* 33* 36*  ANIONGAP 8 7 3*    No results for input(s): PROT, ALBUMIN, AST, ALT, ALKPHOS, BILITOT in the last 168 hours. Hematology Recent Labs  Lab 08/17/17 0345 08/18/17 0640 08/19/17 0536  WBC 7.2 6.8 7.1  RBC 2.32* 3.27* 3.23*  HGB 7.4* 10.0* 9.9*  HCT 23.3* 31.2* 31.1*  MCV 100.4* 95.4 96.3  MCH 31.9 30.6 30.7  MCHC 31.8 32.1 31.8  RDW 16.7* 19.4* 18.9*  PLT 161 171 184   Cardiac Enzymes Recent Labs  Lab 08/15/17 1923 08/16/17 0448 08/18/17 0640  TROPONINI 0.10* 0.09* 0.60*   No results for input(s): TROPIPOC in the last 168 hours.  BNPNo results for input(s): BNP, PROBNP in the last 168 hours.  DDimer No results for input(s): DDIMER in the last 168 hours.  Radiology/Studies:  Dg Humerus Left  Result Date: 08/16/2017 CLINICAL DATA:  Fracture left humeral shaft. EXAM: LEFT HUMERUS - 2+ VIEW COMPARISON:  Intraoperative fluoroscopy 08/15/2017. Left humerus 08/14/2017 FINDINGS: Postoperative changes with plate  and screw fixation of a fracture of the midshaft left humerus. Fracture fragments appear well approximated. Alignment and position appears anatomic. Skin clips consistent with recent surgery. No focal bone lesion or bone destruction. IMPRESSION: Anatomic alignment and position of fractures of the midshaft left humerus after plate and screw fixation. Electronically Signed   By: Lucienne Capers M.D.   On: 08/16/2017 01:20   Dg Humerus Left  Result Date: 08/15/2017 CLINICAL DATA:  ORIF left humerus EXAM: LEFT HUMERUS - 2+ VIEW; DG C-ARM 61-120 MIN COMPARISON:  08/14/2017 FINDINGS: Three low resolution intraoperative spot views of the left humerus. Total fluoroscopy time was 11 seconds. Images demonstrate surgical plate and screw fixation of mid humerus fracture with anatomic alignment IMPRESSION: Intraoperative fluoroscopic assistance provided during surgical fixation of left humerus fracture. Electronically Signed   By: Donavan Foil M.D.   On: 08/15/2017 23:24   Dg C-arm 1-60 Min  Result Date: 08/15/2017 CLINICAL DATA:  ORIF left humerus EXAM: LEFT HUMERUS - 2+ VIEW; DG C-ARM 61-120 MIN COMPARISON:  08/14/2017 FINDINGS: Three low resolution intraoperative spot views of the left humerus. Total fluoroscopy time was 11 seconds. Images demonstrate surgical plate and screw fixation of mid humerus fracture with anatomic alignment IMPRESSION: Intraoperative fluoroscopic assistance provided during surgical fixation of left humerus fracture. Electronically Signed   By: Donavan Foil M.D.   On: 08/15/2017 23:24    Assessment  and Plan:   1. Chest discomfort, very likely musculoskeletal. 2. Elevated troponin I of uncertain significance.  No clinical evidence of ACS.  Elevation possibly related to CKD.  No specific work-up is required given the clinical circumstances and the patient's age.  If exertional chest discomfort or clinical features significantly change, representing angina, would consider further  evaluation.  I would advocate for conservative management currently. 3. History of ascending aortic root replacement, aortic valve replacement (tissue prosthesis), and left internal mammary to LAD as part of the aortic root surgical procedure. 4. Open fracture left arm, surgically repaired 5. CKD stage III-IV 6. DDD pacemaker with atrial tracking and ventricular pacing  I do not believe further troponin I blood tests should be drawn.  I will check a BNP.  Repeat EKG in a.m.  No further clinical evaluation unless a significant change in clinical status warrants otherwise.   For questions or updates, please contact [facility] Please consult [email or web address] for contact info under Cardiology/STEMI.   Signed, Sinclair Grooms, MD  08/19/2017 4:39 PM

## 2017-08-19 NOTE — Progress Notes (Signed)
Subjective: 4 Days Post-Op Procedure(s) (LRB): OPEN REDUCTION INTERNAL FIXATION (ORIF) PROXIMAL HUMERUS FRACTURE (Left) IRRIGATION AND DEBRIDEMENT EXTREMITY (Left) Patient reports pain as 3 on 0-10 scale.   The patient is doing well and has no complaints. She has tolerated food and liquid without difficulty. She has had flatus but no bowel movement.  She denies chest pain, shortness of breath, nausea, vomiting, diarrhea, or fever.   Objective: Vital signs in last 24 hours: Temp:  [98.1 F (36.7 C)-98.3 F (36.8 C)] 98.1 F (36.7 C) (07/07 0354) Pulse Rate:  [60-70] 65 (07/07 0354) BP: (144-165)/(58-70) 144/70 (07/07 0354) SpO2:  [94 %-96 %] 96 % (07/07 1100)  Intake/Output from previous day: 07/06 0701 - 07/07 0700 In: 472 [P.O.:472] Out: 750 [Urine:750] Intake/Output this shift: No intake/output data recorded.  Recent Labs    08/17/17 0345 08/18/17 0640 08/19/17 0536  HGB 7.4* 10.0* 9.9*   Recent Labs    08/18/17 0640 08/19/17 0536  WBC 6.8 7.1  RBC 3.27* 3.23*  HCT 31.2* 31.1*  PLT 171 184   Recent Labs    08/18/17 0640 08/19/17 0536  NA 140 139  K 3.8 4.3  CL 108 109  CO2 25 27  BUN 27* 27*  CREATININE 1.51* 1.43*  GLUCOSE 89 106*  CALCIUM 8.3* 8.3*   No results for input(s): LABPT, INR in the last 72 hours.  Neurovascular intact Sensation intact distally Intact pulses distally Incision: dressing C/D/I  Able to make a fist, cross fingers, and abduct thumb.     Assessment/Plan: 4 Days Post-Op Procedure(s) (LRB): OPEN REDUCTION INTERNAL FIXATION (ORIF) PROXIMAL HUMERUS FRACTURE (Left) IRRIGATION AND DEBRIDEMENT EXTREMITY (Left) Advance diet Up with therapy Discharge to SNF when bed is available. The patient's son would like for her to go to [facility] in [place] and states that he was told that there is a bed available there.  WBAT to LUE   Brynda Peon 08/19/2017, 1:11 PM

## 2017-08-19 NOTE — Progress Notes (Addendum)
CSW spoke with pt's son Mohini Heathcock at [phone].  Pt's son will follow up with SNF choice.  Pt's son has requested that pt not be discharge to SNF until we hear from him.   Reed Breech LCSWA [phone]

## 2017-08-19 NOTE — Progress Notes (Signed)
Pt's son requesting nuclear study in the hospital. MD paged.

## 2017-08-20 DIAGNOSIS — Z950 Presence of cardiac pacemaker: Secondary | ICD-10-CM | POA: Diagnosis not present

## 2017-08-20 DIAGNOSIS — Z7401 Bed confinement status: Secondary | ICD-10-CM | POA: Diagnosis not present

## 2017-08-20 DIAGNOSIS — I10 Essential (primary) hypertension: Secondary | ICD-10-CM | POA: Diagnosis not present

## 2017-08-20 DIAGNOSIS — I5020 Unspecified systolic (congestive) heart failure: Secondary | ICD-10-CM | POA: Diagnosis not present

## 2017-08-20 DIAGNOSIS — W1830XA Fall on same level, unspecified, initial encounter: Secondary | ICD-10-CM | POA: Diagnosis not present

## 2017-08-20 DIAGNOSIS — Z23 Encounter for immunization: Secondary | ICD-10-CM | POA: Diagnosis not present

## 2017-08-20 DIAGNOSIS — N184 Chronic kidney disease, stage 4 (severe): Secondary | ICD-10-CM

## 2017-08-20 DIAGNOSIS — D649 Anemia, unspecified: Secondary | ICD-10-CM | POA: Diagnosis not present

## 2017-08-20 DIAGNOSIS — S42202B Unspecified fracture of upper end of left humerus, initial encounter for open fracture: Secondary | ICD-10-CM | POA: Diagnosis not present

## 2017-08-20 DIAGNOSIS — E569 Vitamin deficiency, unspecified: Secondary | ICD-10-CM | POA: Diagnosis not present

## 2017-08-20 DIAGNOSIS — N189 Chronic kidney disease, unspecified: Secondary | ICD-10-CM | POA: Diagnosis not present

## 2017-08-20 DIAGNOSIS — Z5189 Encounter for other specified aftercare: Secondary | ICD-10-CM | POA: Diagnosis not present

## 2017-08-20 DIAGNOSIS — S42302B Unspecified fracture of shaft of humerus, left arm, initial encounter for open fracture: Secondary | ICD-10-CM | POA: Diagnosis not present

## 2017-08-20 DIAGNOSIS — I495 Sick sinus syndrome: Secondary | ICD-10-CM | POA: Diagnosis not present

## 2017-08-20 DIAGNOSIS — H4010X0 Unspecified open-angle glaucoma, stage unspecified: Secondary | ICD-10-CM | POA: Diagnosis not present

## 2017-08-20 DIAGNOSIS — R748 Abnormal levels of other serum enzymes: Secondary | ICD-10-CM | POA: Diagnosis not present

## 2017-08-20 DIAGNOSIS — R778 Other specified abnormalities of plasma proteins: Secondary | ICD-10-CM

## 2017-08-20 DIAGNOSIS — M2550 Pain in unspecified joint: Secondary | ICD-10-CM | POA: Diagnosis not present

## 2017-08-20 DIAGNOSIS — S42302D Unspecified fracture of shaft of humerus, left arm, subsequent encounter for fracture with routine healing: Secondary | ICD-10-CM | POA: Diagnosis not present

## 2017-08-20 DIAGNOSIS — K5900 Constipation, unspecified: Secondary | ICD-10-CM | POA: Diagnosis not present

## 2017-08-20 DIAGNOSIS — E785 Hyperlipidemia, unspecified: Secondary | ICD-10-CM | POA: Diagnosis not present

## 2017-08-20 DIAGNOSIS — R7989 Other specified abnormal findings of blood chemistry: Secondary | ICD-10-CM

## 2017-08-20 DIAGNOSIS — M6281 Muscle weakness (generalized): Secondary | ICD-10-CM | POA: Diagnosis not present

## 2017-08-20 DIAGNOSIS — S42302A Unspecified fracture of shaft of humerus, left arm, initial encounter for closed fracture: Secondary | ICD-10-CM | POA: Diagnosis not present

## 2017-08-20 LAB — CBC
HCT: 28.7 % — ABNORMAL LOW (ref 36.0–46.0)
Hemoglobin: 9.3 g/dL — ABNORMAL LOW (ref 12.0–15.0)
MCH: 31 pg (ref 26.0–34.0)
MCHC: 32.4 g/dL (ref 30.0–36.0)
MCV: 95.7 fL (ref 78.0–100.0)
Platelets: 197 10*3/uL (ref 150–400)
RBC: 3 MIL/uL — ABNORMAL LOW (ref 3.87–5.11)
RDW: 18 % — ABNORMAL HIGH (ref 11.5–15.5)
WBC: 8.6 10*3/uL (ref 4.0–10.5)

## 2017-08-20 LAB — BASIC METABOLIC PANEL
Anion gap: 4 — ABNORMAL LOW (ref 5–15)
BUN: 27 mg/dL — ABNORMAL HIGH (ref 8–23)
CO2: 28 mmol/L (ref 22–32)
Calcium: 8.3 mg/dL — ABNORMAL LOW (ref 8.9–10.3)
Chloride: 108 mmol/L (ref 98–111)
Creatinine, Ser: 1.3 mg/dL — ABNORMAL HIGH (ref 0.44–1.00)
GFR calc Af Amer: 40 mL/min — ABNORMAL LOW (ref >60)
GFR calc non Af Amer: 35 mL/min — ABNORMAL LOW (ref >60)
Glucose, Bld: 110 mg/dL — ABNORMAL HIGH (ref 70–99)
Potassium: 4.3 mmol/L (ref 3.5–5.1)
Sodium: 140 mmol/L (ref 135–145)

## 2017-08-20 LAB — FOLATE RBC
Folate, Hemolysate: 404 ng/mL
Folate, RBC: 1610 ng/mL (ref >498)
Hematocrit: 25.1 % — ABNORMAL LOW (ref 34.0–46.6)

## 2017-08-20 MED ORDER — DOCUSATE SODIUM 100 MG PO CAPS: 100.0000 mg | ORAL_CAPSULE | Freq: Two times a day (BID) | ORAL | 0 refills | Status: DC

## 2017-08-20 MED ORDER — CARVEDILOL 6.25 MG PO TABS: 6.2500 mg | ORAL_TABLET | Freq: Two times a day (BID) | ORAL | 3 refills | Status: DC

## 2017-08-20 MED ORDER — POLYETHYLENE GLYCOL 3350 17 G PO PACK: 17.0000 g | PACK | Freq: Every day | ORAL | 0 refills | Status: DC | PRN

## 2017-08-20 MED ORDER — TRAMADOL HCL 50 MG PO TABS: 50.0000 mg | ORAL_TABLET | Freq: Four times a day (QID) | ORAL | 0 refills | Status: DC | PRN

## 2017-08-20 NOTE — Clinical Social Work Placement (Signed)
   CLINICAL SOCIAL WORK PLACEMENT  NOTE  Date:  08/20/2017  Patient Details  Name: Holly Bolton MRN: [id number] Date of Birth: 22-Apr-1922  Clinical Social Work is seeking post-discharge placement for this patient at the [place] level of care (*CSW will initial, date and re-position this form in  chart as items are completed):  Yes   Patient/family provided with [facility] Work Department's list of facilities offering this level of care within the geographic area requested by the patient (or if unable, by the patient's family).  Yes   Patient/family informed of their freedom to choose among providers that offer the needed level of care, that participate in Medicare, Medicaid or managed care program needed by the patient, have an available bed and are willing to accept the patient.  Yes   Patient/family informed of [facility]'s ownership interest in [facility] and [facility], as well as of the fact that they are under no obligation to receive care at these facilities.  PASRR submitted to EDS on       PASRR number received on 08/16/17     Existing PASRR number confirmed on       FL2 transmitted to all facilities in geographic area requested by pt/family on 08/17/17     FL2 transmitted to all facilities within larger geographic area on       Patient informed that his/her managed care company has contracts with or will negotiate with certain facilities, including the following:        Yes   Patient/family informed of bed offers received.  Patient chooses bed at [facility]     Physician recommends and patient chooses bed at      Patient to be transferred to [facility] on  .  Patient to be transferred to facility by [facility]     Patient family notified on   of transfer.  Name of family member notified:  son Theall notified     PHYSICIAN Please sign FL2     Additional Comment:     _______________________________________________ Jorge Ny, LCSW 08/20/2017, 12:12 PM

## 2017-08-20 NOTE — Progress Notes (Signed)
Patient will discharge to [facility] Anticipated discharge date: 7/8 Family notified: pt son at bedside Transportation by [facility]- called at 2:15pm  CSW signing off.  Jorge Ny, LCSW Clinical Social Worker [phone]

## 2017-08-20 NOTE — Discharge Summary (Addendum)
Physician Discharge Summary  Holly Bolton WVP:[id number] DOB: [birth year] DOA: 08/15/2017  PCP: Jani Gravel, MD  Admit date: 08/15/2017 Discharge date: 08/20/2017  Admitted From: Home  Disposition:  SNF   Recommendations for Outpatient Follow-up:  1. Follow up with Cardiology in 3 weeks 2. Please obtain BMP and CBC in one week at SNF to follow up creatinine and blood count 3. Please follow up on Folate level drawn in hospital   Home Health: None  Equipment/Devices: TBD at SNF  Discharge Condition: Fair  CODE STATUS: FULL Diet recommendation: Cardiac  Brief/Interim Summary: Holly Bolton is a 84 y.o. F with HTN, SSS with pacer, history of AV repair, pig valve as well as LIMA to LAD not for atherosclerosis, chronic normocytic anemia and CKD III who presents with fall and open left humeral fracture.  Patient initially seen in the ER, thought to have simple humeral fracture, discharged to home with Orthopedics follow up.  Seen in Ortho office, there was found to be an open fracture, sent back to the ER and admitted for type II open humeral fracture, debrided and fixed.          Discharge Diagnoses:   Open left humerus fracture Went to OR on 7/3 with Dr. Stann Mainland.  Uncomplicated surgery. -Weight bearing as tolerate to LUE   Hypertension BP poorly controlled, beta-blocker titrated, ARB and Lasix continued.    SSS status post pacer Echocardiogram dictation suggested patient was in atrial fibrillation.  She was on continuous telemetry monitoring in the hospital, had frequent periods of bigeminy and PVCs, but no atrial fibrillation noted.     Elevated troponin This was a low level troponin, with flat trajectory.  Evaluated by Cardiology formally, who agreed that ischemic work up in hospital was not necessary in light of normal echocardiogram and ECG, abscence of exertional symptoms.    She has close follow up with her Cardiologist.  Chronic normocytic anemia Post-operatively anemia  worsened.  Transfused 1 unit for Hgb 7.4 in light of elevated troponin/cardiac disease.  MCV elevated.  B12 elevated.  Folate pending.  CKD stage III-IV Cr stable no change.  GFR is 25-35.  Underweight      Discharge Instructions  Discharge Instructions    Diet - low sodium heart healthy   Complete by:  As directed    Discharge instructions   Complete by:  As directed    From Dr. Loleta Books: You were admitted with open humerus fracture.   This was washed out and fixed with a plate in the operating room. You may bear weight as tolerated on the left arm. Follow up with your surgeon's office as they instructed.  Call their office if you are not sure of the follow up plan.  For pain:  Take tramadol 50 mg up to every 6 hours as needed Use acetaminophen as needed     For the heart: Follow up with Dr. Donley Redder office as listed in the appointment on this paper.   Increase activity slowly   Complete by:  As directed      Allergies as of 08/20/2017      Reactions   Accupril [quinapril Hcl] Other (See Comments)   Unknown reaction    Amlodipine Swelling   Benadryl [diphenhydramine Hcl] Other (See Comments)   Unknown reaction    Biaxin [clarithromycin] Other (See Comments)   Unknown reaction    Ciprofloxacin Other (See Comments)   Unknown reaction    Codeine Other (See Comments)   Unknown reaction  Diovan [valsartan] Other (See Comments)   Unknown reaction    Medrol [methylprednisolone] Other (See Comments)   Unknown   Morphine And Related Other (See Comments)   Unknown reaction    Neurontin [gabapentin] Other (See Comments)   Unknown reaction    Penicillins    Unknown reaction  Has patient had a PCN reaction causing immediate rash, facial/tongue/throat swelling, SOB or lightheadedness with hypotension: Unknown Has patient had a PCN reaction causing severe rash involving mucus membranes or skin necrosis: Unknown Has patient had a PCN reaction that required  hospitalization: Unknown Has patient had a PCN reaction occurring within the last 10 years: No If all of the above answers are "NO", then may proceed with Cephalosporin use.      Medication List    STOP taking these medications   HYDROcodone-acetaminophen 5-325 MG tablet Commonly known as:  NORCO/VICODIN     TAKE these medications   aspirin 81 MG tablet Take 81 mg by mouth daily as needed for pain.   brimonidine 0.2 % ophthalmic solution Commonly known as:  ALPHAGAN instill ONE DROP IN EACH EYE TWICE DAILY   carvedilol 6.25 MG tablet Commonly known as:  COREG Take 1 tablet (6.25 mg total) by mouth 2 (two) times daily with a meal. What changed:    medication strength  how much to take   CoQ10 200 MG Caps Take 200 mg by mouth daily.   docusate sodium 100 MG capsule Commonly known as:  COLACE Take 1 capsule (100 mg total) by mouth 2 (two) times daily.   furosemide 20 MG tablet Commonly known as:  LASIX Take 1 tablet (20 mg total) by mouth 2 (two) times a week. Mondays and Fridays   Magnesium 250 MG Tabs Take 1 tablet (250 mg total) by mouth 2 (two) times daily.   olmesartan 40 MG tablet Commonly known as:  BENICAR Take 40 mg by mouth daily.   polyethylene glycol packet Commonly known as:  MIRALAX / GLYCOLAX Take 17 g by mouth daily as needed for moderate constipation.   traMADol 50 MG tablet Commonly known as:  ULTRAM Take 1 tablet (50 mg total) by mouth every 6 (six) hours as needed for severe pain.   trolamine salicylate 10 % cream Commonly known as:  ASPERCREME Apply 1 application topically as needed for muscle pain.   vitamin B-12 1000 MCG tablet Commonly known as:  CYANOCOBALAMIN Take 1,000 mcg by mouth 2 (two) times daily.       Contact information for follow-up providers    Nicholes Stairs, MD In 2 weeks.   Specialty:  Orthopedic Surgery Contact information: [address] [place] [address] [phone]         Ledora Bottcher, PA Follow up.   Specialties:  Physician Assistant, Cardiology, Radiology Why:  You have a cardiology hospital follow appointment on 09/05/17 at 1:30 with Dr. Victorino December PA. You will review the stress test results at that time.  Contact information: [address] [place] [address] [phone]        [facility] Follow up.   Specialty:  Cardiology Why:  You have a stress test scheuled for 08/24/17 at 12:45. Please see attached instructions. Contact information: [address] [place] [phone]           Contact information for after-discharge care    Destination    [facility] .   Service:  Skilled Nursing Contact  information: [place] [phone]                 Allergies  Allergen Reactions  . Accupril [Quinapril Hcl] Other (See Comments)    Unknown reaction   . Amlodipine Swelling  . Benadryl [Diphenhydramine Hcl] Other (See Comments)    Unknown reaction   . Biaxin [Clarithromycin] Other (See Comments)    Unknown reaction   . Ciprofloxacin Other (See Comments)    Unknown reaction   . Codeine Other (See Comments)    Unknown reaction   . Diovan [Valsartan] Other (See Comments)    Unknown reaction   . Medrol [Methylprednisolone] Other (See Comments)    Unknown  . Morphine And Related Other (See Comments)    Unknown reaction   . Neurontin [Gabapentin] Other (See Comments)    Unknown reaction   . Penicillins     Unknown reaction  Has patient had a PCN reaction causing immediate rash, facial/tongue/throat swelling, SOB or lightheadedness with hypotension: Unknown Has patient had a PCN reaction causing severe rash involving mucus membranes or skin necrosis: Unknown Has patient had a PCN reaction that required hospitalization: Unknown Has patient had a PCN reaction occurring within the last 10 years: No If all of  the above answers are "NO", then may proceed with Cephalosporin use.     Consultations:  Cardiology  Orthopedics   Procedures/Studies: Dg Chest 2 View  Result Date: 08/15/2017 CLINICAL DATA:  Preoperative examination for patient with a left humerus fracture. EXAM: CHEST - 2 VIEW COMPARISON:  PA and lateral chest 12/17/2014. FINDINGS: The patient is status post CABG with a pacing device in place. Small to moderate bilateral pleural effusions are seen, larger on the left. There is left basilar airspace disease. Heart size is enlarged. No pulmonary edema. IMPRESSION: Small to moderate bilateral pleural effusions, larger on the left. Associated basilar airspace disease is likely atelectasis. Cardiomegaly without edema. Electronically Signed   By: Inge Rise M.D.   On: 08/15/2017 15:13   Dg Pelvis 1-2 Views  Result Date: 08/14/2017 CLINICAL DATA:  Left hip pain after fall EXAM: PELVIS - 1-2 VIEW COMPARISON:  None. FINDINGS: There is no evidence of pelvic fracture or diastasis. No pelvic bone lesions are seen. Mild bilateral hip joint space narrowing. IMPRESSION: Negative. Electronically Signed   By: Ulyses Jarred M.D.   On: 08/14/2017 01:52   Dg Humerus Left  Result Date: 08/16/2017 CLINICAL DATA:  Fracture left humeral shaft. EXAM: LEFT HUMERUS - 2+ VIEW COMPARISON:  Intraoperative fluoroscopy 08/15/2017. Left humerus 08/14/2017 FINDINGS: Postoperative changes with plate and screw fixation of a fracture of the midshaft left humerus. Fracture fragments appear well approximated. Alignment and position appears anatomic. Skin clips consistent with recent surgery. No focal bone lesion or bone destruction. IMPRESSION: Anatomic alignment and position of fractures of the midshaft left humerus after plate and screw fixation. Electronically Signed   By: Lucienne Capers M.D.   On: 08/16/2017 01:20   Dg Humerus Left  Result Date: 08/15/2017 CLINICAL DATA:  ORIF left humerus EXAM: LEFT HUMERUS - 2+ VIEW;  DG C-ARM 61-120 MIN COMPARISON:  08/14/2017 FINDINGS: Three low resolution intraoperative spot views of the left humerus. Total fluoroscopy time was 11 seconds. Images demonstrate surgical plate and screw fixation of mid humerus fracture with anatomic alignment IMPRESSION: Intraoperative fluoroscopic assistance provided during surgical fixation of left humerus fracture. Electronically Signed   By: Donavan Foil M.D.   On: 08/15/2017 23:24   Dg  Humerus Left  Result Date: 08/14/2017 CLINICAL DATA:  Fall with hematoma EXAM: LEFT HUMERUS - 2+ VIEW COMPARISON:  None. FINDINGS: Acute fracture midshaft of the humerus with 1/2 bone with of posterior and lateral displacement. About 2 cm of overriding. The left humeral head is normally position. IMPRESSION: Acute displaced and overriding fracture midshaft of the humerus Electronically Signed   By: Donavan Foil M.D.   On: 08/14/2017 01:00   Dg C-arm 1-60 Min  Result Date: 08/15/2017 CLINICAL DATA:  ORIF left humerus EXAM: LEFT HUMERUS - 2+ VIEW; DG C-ARM 61-120 MIN COMPARISON:  08/14/2017 FINDINGS: Three low resolution intraoperative spot views of the left humerus. Total fluoroscopy time was 11 seconds. Images demonstrate surgical plate and screw fixation of mid humerus fracture with anatomic alignment IMPRESSION: Intraoperative fluoroscopic assistance provided during surgical fixation of left humerus fracture. Electronically Signed   By: Donavan Foil M.D.   On: 08/15/2017 23:24   Echocardiogram 7/4 Study Conclusions  - Left ventricle: The cavity size was normal. Wall thickness was   increased in a pattern of moderate LVH. Systolic function was   normal. The estimated ejection fraction was in the range of 50%   to 55%. The study was not technically sufficient to allow   evaluation of LV diastolic dysfunction due to atrial   fibrillation. - Aortic valve: Moderately calcified annulus. Trileaflet;   moderately thickened leaflets. Valve area (VTI): 2.53 cm^2.  Valve   area (Vmax): 2.53 cm^2. Valve area (Vmean): 2.61 cm^2. - Mitral valve: Mildly calcified annulus. Mildly thickened leaflets   . There was mild to moderate regurgitation. - Left atrium: The atrium was severely dilated. - Right ventricle: The cavity size was mildly dilated. - Right atrium: The atrium was moderately dilated. - Tricuspid valve: There was moderate regurgitation. - Pulmonary arteries: Systolic pressure was moderately increased.   PA peak pressure: 58 mm Hg (S). - Technically difficult study.   Subjective: Arm sore.  No other complaints.  Sitting up in chair, conversing with family.  No respiratory distress, fever, or confusion overnight.    Discharge Exam: Vitals:   08/19/17 2036 08/20/17 0449  BP: (!) 158/63 133/60  Pulse: 81 66  Resp:    Temp: 98 F (36.7 C) 98.8 F (37.1 C)  SpO2: 93% 94%   Vitals:   08/19/17 0354 08/19/17 1100 08/19/17 2036 08/20/17 0449  BP: (!) 144/70  (!) 158/63 133/60  Pulse: 65  81 66  Resp:      Temp: 98.1 F (36.7 C)  98 F (36.7 C) 98.8 F (37.1 C)  TempSrc: Oral  Oral Oral  SpO2: 94% 96% 93% 94%  Weight:      Height:        General: Pt is alert, awake, not in acute distress, sitting in recliner Cardiovascular: RRR, S1/S2 +, no rubs, no gallops Respiratory: CTA bilaterally, no wheezing, no rhonchi Abdominal: Soft, NT, ND, bowel sounds + Extremities: no edema, no cyanosis    The results of significant diagnostics from this hospitalization (including imaging, microbiology, ancillary and laboratory) are listed below for reference.     Microbiology: Recent Results (from the past 240 hour(s))  MRSA PCR Screening     Status: None   Collection Time: 08/15/17  6:57 PM  Result Value Ref Range Status   MRSA by PCR NEGATIVE NEGATIVE Final    Comment:        The GeneXpert MRSA Assay (FDA approved for NASAL specimens only), is one component of a comprehensive MRSA  colonization surveillance program. It is not intended  to diagnose MRSA infection nor to guide or monitor treatment for MRSA infections. Performed at [facility], [place] [address] [place], [place] [address]      Labs: BNP (last 3 results) No results for input(s): BNP in the last 8760 hours. Basic Metabolic Panel: Recent Labs  Lab 08/16/17 0448 08/17/17 0345 08/18/17 0640 08/19/17 0536 08/20/17 0258  NA 139 138 140 139 140  K 4.8 4.5 3.8 4.3 4.3  CL 105 109 108 109 108  CO2 24 21* 25 27 28   GLUCOSE 109* 95 89 106* 110*  BUN 27* 22 27* 27* 27*  CREATININE 1.67* 1.35* 1.51* 1.43* 1.30*  CALCIUM 8.3* 7.5* 8.3* 8.3* 8.3*   Liver Function Tests: No results for input(s): AST, ALT, ALKPHOS, BILITOT, PROT, ALBUMIN in the last 168 hours. No results for input(s): LIPASE, AMYLASE in the last 168 hours. No results for input(s): AMMONIA in the last 168 hours. CBC: Recent Labs  Lab 08/14/17 0103 08/15/17 1535 08/16/17 0448 08/17/17 0345 08/18/17 0640 08/19/17 0536 08/20/17 0258  WBC 14.3* 8.8 10.4 7.2 6.8 7.1 8.6  NEUTROABS 12.3* 6.9  --   --   --   --   --   HGB 9.9* 9.9* 9.0* 7.4* 10.0* 9.9* 9.3*  HCT 31.6* 31.3* 29.2* 23.3* 31.2* 31.1* 28.7*  MCV 100.3* 100.6* 100.7* 100.4* 95.4 96.3 95.7  PLT 228 210 172 161 171 184 197   Cardiac Enzymes: Recent Labs  Lab 08/15/17 1923 08/16/17 0448 08/18/17 0640  TROPONINI 0.10* 0.09* 0.60*   BNP: Invalid input(s): POCBNP CBG: No results for input(s): GLUCAP in the last 168 hours. D-Dimer No results for input(s): DDIMER in the last 72 hours. Hgb A1c No results for input(s): HGBA1C in the last 72 hours. Lipid Profile No results for input(s): CHOL, HDL, LDLCALC, TRIG, CHOLHDL, LDLDIRECT in the last 72 hours. Thyroid function studies No results for input(s): TSH, T4TOTAL, T3FREE, THYROIDAB in the last 72 hours.  Invalid input(s): FREET3 Anemia work up No results for input(s): VITAMINB12, FOLATE, FERRITIN, TIBC, IRON, RETICCTPCT in the last 72 hours. Urinalysis     Component Value Date/Time   COLORURINE YELLOW 08/14/2017 0121   APPEARANCEUR CLEAR 08/14/2017 0121   LABSPEC 1.009 08/14/2017 0121   PHURINE 5.0 08/14/2017 0121   GLUCOSEU NEGATIVE 08/14/2017 0121   HGBUR NEGATIVE 08/14/2017 0121   BILIRUBINUR NEGATIVE 08/14/2017 0121   KETONESUR NEGATIVE 08/14/2017 0121   PROTEINUR NEGATIVE 08/14/2017 0121   NITRITE NEGATIVE 08/14/2017 0121   LEUKOCYTESUR NEGATIVE 08/14/2017 0121   Sepsis Labs Invalid input(s): PROCALCITONIN,  WBC,  LACTICIDVEN Microbiology Recent Results (from the past 240 hour(s))  MRSA PCR Screening     Status: None   Collection Time: 08/15/17  6:57 PM  Result Value Ref Range Status   MRSA by PCR NEGATIVE NEGATIVE Final    Comment:        The GeneXpert MRSA Assay (FDA approved for NASAL specimens only), is one component of a comprehensive MRSA colonization surveillance program. It is not intended to diagnose MRSA infection nor to guide or monitor treatment for MRSA infections. Performed at [facility], [place] [address], [place], [place] [address]      Time coordinating discharge: 25 minutes The [place] controlled substances registry was reviewed for this patient prior to filling the <5 days supply controlled substances script.      SIGNED:   Edwin Dada, MD  [facility] 08/20/2017, 1:28 PM

## 2017-08-20 NOTE — Progress Notes (Signed)
Progress Note  Patient Name: Holly Bolton Date of Encounter: 08/20/2017  Primary Cardiologist:   Sanda Klein MD    Subjective   84 year old female with a history of aortic root replacement with a bioprosthetic aortic valve replacement and LIMA to LAD in 1989 who we are asked to see for a minimally elevated troponin level at the request of Dr. Loleta Books.  She was recently admitted for left arm fracture.  Troponin levels following surgery were minimally elevated.  The patient denies any chest pain.  She does have some chronic shortness of breath.  He is very frail.  Dr. Tamala Julian saw her yesterday and recommended medical management. Agree with that recommendation.  She is not a good candidate for any further evaluation at this time.  Inpatient Medications    Scheduled Meds: . brimonidine  1 drop Both Eyes BID  . carvedilol  6.25 mg Oral BID WC  . docusate sodium  100 mg Oral BID  . furosemide  20 mg Oral Once per day on Mon Fri  . irbesartan  150 mg Oral Daily  . polyethylene glycol  17 g Oral Daily  . senna-docusate  1 tablet Oral QHS  . vitamin B-12  1,000 mcg Oral BID   Continuous Infusions:  PRN Meds: acetaminophen, bisacodyl, metoCLOPramide **OR** metoCLOPramide (REGLAN) injection, morphine injection, ondansetron **OR** ondansetron (ZOFRAN) IV, sodium phosphate, traMADol   Vital Signs    Vitals:   08/19/17 0354 08/19/17 1100 08/19/17 2036 08/20/17 0449  BP: (!) 144/70  (!) 158/63 133/60  Pulse: 65  81 66  Resp:      Temp: 98.1 F (36.7 C)  98 F (36.7 C) 98.8 F (37.1 C)  TempSrc: Oral  Oral Oral  SpO2: 94% 96% 93% 94%  Weight:      Height:        Intake/Output Summary (Last 24 hours) at 08/20/2017 1423 Last data filed at 08/20/2017 1228 Gross per 24 hour  Intake 360 ml  Output 500 ml  Net -140 ml   Filed Weights   08/15/17 1429  Weight: 100 lb (45.4 kg)    Telemetry    NSR  - Personally Reviewed  ECG     NSR  - Personally Reviewed  Physical Exam    GEN:  Elderly, frail female, no acute distress Neck: No JVD Cardiac:  Regular rate S1-S2.   Soft systolic murmur Respiratory: Clear to auscultation bilaterally. GI: Soft, nontender, non-distended  MS: No edema; No deformity. Neuro:  Nonfocal  Psych: Normal affect   Labs    Chemistry Recent Labs  Lab 08/18/17 0640 08/19/17 0536 08/20/17 0258  NA 140 139 140  K 3.8 4.3 4.3  CL 108 109 108  CO2 25 27 28   GLUCOSE 89 106* 110*  BUN 27* 27* 27*  CREATININE 1.51* 1.43* 1.30*  CALCIUM 8.3* 8.3* 8.3*  GFRNONAA 29* 31* 35*  GFRAA 33* 36* 40*  ANIONGAP 7 3* 4*     Hematology Recent Labs  Lab 08/18/17 0640 08/19/17 0536 08/20/17 0258  WBC 6.8 7.1 8.6  RBC 3.27* 3.23* 3.00*  HGB 10.0* 9.9* 9.3*  HCT 31.2* 31.1* 28.7*  MCV 95.4 96.3 95.7  MCH 30.6 30.7 31.0  MCHC 32.1 31.8 32.4  RDW 19.4* 18.9* 18.0*  PLT 171 184 197    Cardiac Enzymes Recent Labs  Lab 08/15/17 1923 08/16/17 0448 08/18/17 0640  TROPONINI 0.10* 0.09* 0.60*   No results for input(s): TROPIPOC in the last 168 hours.   BNPNo  results for input(s): BNP, PROBNP in the last 168 hours.   DDimer No results for input(s): DDIMER in the last 168 hours.   Radiology    No results found.  Cardiac Studies     Patient Profile     84 y.o. female admitted with a left arm fracture.  She was found to have minimally elevated troponin levels.  Assessment & Plan    1.  Elevated troponin levels: The patient denies any chest pain.  Her EKG is nonacute.  The troponin elevations are very minimal and I would not recommend any further evaluation at this time.  We can consider further evaluation if she has any episodes of anginal-like chest pain.  She will be discharged to a skilled nursing facility today.  She will follow-up with Dr. Dani Gobble Croitoru  In the office.   For questions or updates, please contact [place] Please consult [email or web address] for contact info under Cardiology/STEMI.       Signed, Mertie Moores, MD  08/20/2017, 2:23 PM

## 2017-08-20 NOTE — Progress Notes (Signed)
Occupational Therapy Treatment Patient Details Name: Holly Bolton MRN: [id number] DOB: Jan 03, 1923 Today's Date: 08/20/2017    History of present illness Holly Bolton is a 84 y.o. female with a history of asthma, hyperlipidemia, hypertension, history of pacemaker placement in 2010 with generator change in April 2019, peripheral arterial disease, history of diastolic dysfunction, hypertension, hyperlipidemia, brought to the emergency department from her Orthopedic appointment for evaluation and surgery for a L humeral fracture she sustained when taking her garbage out two days before (in ED on Monday she was deemed not a surgical candidate); now s/p ORIF L humeral fracture.   OT comments  Patient progressing well.  She continues to require education on L UE weight bearing and movement restrictions at shoulder, but is tolerating increased AROM at elbow and PROM at shoulder. She continues to required modA for sit to standing and stand pivot transfers, presenting with posterior lean upon standing initially but with verbal and tactile cueing able to correct.  Continue to recommend SNF rehab at dc in order to maximize independence and decrease burden of care upon return home.  POC remains appropriate at this time.  Will continue to follow while admitted.     Follow Up Recommendations  SNF;Supervision/Assistance - 24 hour    Equipment Recommendations  3 in 1 bedside commode    Recommendations for Other Services      Precautions / Restrictions Precautions Precautions: Fall Required Braces or Orthoses: Sling Restrictions Weight Bearing Restrictions: Yes LUE Weight Bearing: Non weight bearing       Mobility Bed Mobility Overal bed mobility: Needs Assistance Bed Mobility: Supine to Sit     Supine to sit: Min guard     General bed mobility comments: cueing to ensure NWB to L UE, greatly increased time and effort required   Transfers Overall transfer level: Needs assistance Equipment  used: 1 person hand held assist Transfers: Sit to/from [facility] Sit to Stand: Mod assist Stand pivot transfers: Mod assist       General transfer comment: increased time and effort for forward scooting, physical assistance to ascend and guiding assistance to control descend onto surface; cueing for hand placement, safety and seqencing     Balance Overall balance assessment: Needs assistance Sitting-balance support: Feet supported;Single extremity supported Sitting balance-Leahy Scale: Fair Sitting balance - Comments: kyphotic, cueing for forward scoot; posterior lean when B feet are not on floor   Standing balance support: Single extremity supported;During functional activity Standing balance-Leahy Scale: Poor Standing balance comment: posterior lean initally with each sit to stand, cueing for forward position to correct; reliant on 1 UE support                           ADL either performed or assessed with clinical judgement   ADL Overall ADL's : Needs assistance/impaired                         Toilet Transfer: Moderate assistance;Cueing for safety;Cueing for sequencing;Stand-pivot;BSC Toilet Transfer Details (indicate cue type and reason): cueing for safety, hand placement and sequencing; inital posterior lean upon standing   Toileting- Clothing Manipulation and Hygiene: Maximal assistance;Cueing for sequencing;Cueing for safety;Sit to/from stand Toileting - Clothing Manipulation Details (indicate cue type and reason): seated hygiene spv, assist standing clothing mgmt up and down              Vision       Perception  Praxis      Cognition Arousal/Alertness: Awake/alert Behavior During Therapy: WFL for tasks assessed/performed Overall Cognitive Status: Impaired/Different from baseline Area of Impairment: Memory                     Memory: Decreased recall of precautions;Decreased short-term memory          General Comments: continues to report "no one has told me what I can't do with my arm", reinforced education        Exercises Shoulder Exercises Shoulder Flexion: PROM;10 reps;Seated;Left Shoulder Extension: PROM;Left;10 reps;Seated Shoulder ABduction: PROM;10 reps;Left;Seated Shoulder External Rotation: PROM;Left;10 reps;Seated Elbow Flexion: 10 reps;Left;Seated;AROM Elbow Extension: 10 reps;Left;Seated;AROM Wrist Flexion: AROM;10 reps;Seated;Left Wrist Extension: AROM;Left;10 reps;Seated Digit Composite Flexion: AROM;Left;10 reps;Seated Composite Extension: AROM;Left;10 reps;Seated   Shoulder Instructions Shoulder Instructions Donning/doffing sling/immobilizer: Maximal assistance Correct positioning of sling/immobilizer: Maximal assistance ROM for elbow, wrist and digits of operated UE: Min-guard     General Comments son and neighbor present throughout session     Pertinent Vitals/ Pain       Pain Assessment: Faces Faces Pain Scale: Hurts a little bit Pain Location: L UE  Pain Descriptors / Indicators: Aching;Guarding Pain Intervention(s): Limited activity within patient's tolerance;Repositioned  Home Living                                          Prior Functioning/Environment              Frequency  Min 2X/week        Progress Toward Goals  OT Goals(current goals can now be found in the care plan section)  Progress towards OT goals: Progressing toward goals  Acute Rehab OT Goals Patient Stated Goal: go to rehab OT Goal Formulation: With patient/family Time For Goal Achievement: 08/30/17 Potential to Achieve Goals: Good  Plan Discharge plan remains appropriate;Frequency remains appropriate    Co-evaluation                 AM-PAC PT "6 Clicks" Daily Activity     Outcome Measure   Help from another person eating meals?: A Little Help from another person taking care of personal grooming?: A Little Help from another person  toileting, which includes using toliet, bedpan, or urinal?: A Lot Help from another person bathing (including washing, rinsing, drying)?: A Little Help from another person to put on and taking off regular upper body clothing?: A Lot Help from another person to put on and taking off regular lower body clothing?: A Lot 6 Click Score: 15    End of Session Equipment Utilized During Treatment: Gait belt(sling )  OT Visit Diagnosis: Other abnormalities of gait and mobility (R26.89);Muscle weakness (generalized) (M62.81);Pain Pain - Right/Left: Left Pain - part of body: Arm;Shoulder   Activity Tolerance Patient tolerated treatment well   Patient Left in chair;with call bell/phone within reach;with chair alarm set;with family/visitor present   Nurse Communication Mobility status;Precautions(position and use of 3:1 for toileting )        Time: 2952-8413 OT Time Calculation (min): 35 min  Charges: OT General Charges $OT Visit: 1 Visit OT Treatments $Self Care/Home Management : 8-22 mins $Therapeutic Exercise: 8-22 mins  Delight Stare, OTR/L  Pager [place] 08/20/2017, 12:25 PM

## 2017-08-20 NOTE — Discharge Planning (Signed)
Report attempted x 1

## 2017-08-21 ENCOUNTER — Telehealth: Payer: Self-pay | Admitting: Cardiovascular Disease

## 2017-08-21 NOTE — Telephone Encounter (Signed)
New Message   Pt's son is wanting to speak with Dr. Sallyanne Kuster about why they cancelled the Myo perfusion

## 2017-08-21 NOTE — Telephone Encounter (Signed)
Spoke to son, [name]. He stated that he cancelled patient is myoview . Patient fell last week and broke upper arm. She is now in rehab facility. Son states that he and patient saw 2 of Dr C partners- who cleared for surgery with out having myoview .  Patient had a slight elevated troponin.   Son states patient has been through a lot and felt the test was not necessary at present , but wanted  Dr C. To know that he  Valued his opinion and not trying to undermine any of Dr C. Orders. Son would like to see if Dr C could call this afternoon after 12 noon . Son will be at the rehab facility with patient. [phone]--- Patient does have an appt with extender on 09/05/17. Son will also take  [name]  to rehab.  RN informed son , that message will be sent to Dr C. ,and that Dr Virgel Paling not be upset taht test was cancelled

## 2017-08-21 NOTE — Consult Note (Addendum)
            [facility] CM Primary Care Navigator  08/21/2017  Holly Bolton October 01, 1922 [id number]   Went to see patient at the bedside to identify possible discharge needs but she wasalreadydischarged per staff. Patientwas discharged to skilled nursing facility (SNF- [facility]) per therapy recommendation.  Per chart review, patient presented with a fall and open left humeral fracture status post Open Reduction Internal Fixation of proximal humerus fracture.  Primary care provider's office is listed as providing transition of care (TOC) follow-up.  Patient has discharge instruction to follow-up with cardiology on 08/24/17 and 09/05/17 as well as ortho surgery in 2 weeks post discharge.   For additional questions please contact:  Edwena Felty A. Adelyn Roscher, BSN, RN-BC [facility] PRIMARY CARE Navigator Cell: [phone]

## 2017-08-22 ENCOUNTER — Encounter: Payer: Medicare Other | Admitting: Cardiovascular Disease

## 2017-08-23 DIAGNOSIS — S42302A Unspecified fracture of shaft of humerus, left arm, initial encounter for closed fracture: Secondary | ICD-10-CM | POA: Diagnosis not present

## 2017-08-23 DIAGNOSIS — I10 Essential (primary) hypertension: Secondary | ICD-10-CM | POA: Diagnosis not present

## 2017-08-23 DIAGNOSIS — D649 Anemia, unspecified: Secondary | ICD-10-CM | POA: Diagnosis not present

## 2017-08-23 DIAGNOSIS — I495 Sick sinus syndrome: Secondary | ICD-10-CM | POA: Diagnosis not present

## 2017-08-23 DIAGNOSIS — N189 Chronic kidney disease, unspecified: Secondary | ICD-10-CM | POA: Diagnosis not present

## 2017-08-24 ENCOUNTER — Inpatient Hospital Stay (HOSPITAL_COMMUNITY): Admit: 2017-08-24 | Payer: Medicare Other

## 2017-09-01 DIAGNOSIS — D649 Anemia, unspecified: Secondary | ICD-10-CM | POA: Diagnosis not present

## 2017-09-01 DIAGNOSIS — I10 Essential (primary) hypertension: Secondary | ICD-10-CM | POA: Diagnosis not present

## 2017-09-01 DIAGNOSIS — S42302D Unspecified fracture of shaft of humerus, left arm, subsequent encounter for fracture with routine healing: Secondary | ICD-10-CM | POA: Diagnosis not present

## 2017-09-01 DIAGNOSIS — I495 Sick sinus syndrome: Secondary | ICD-10-CM | POA: Diagnosis not present

## 2017-09-01 DIAGNOSIS — N189 Chronic kidney disease, unspecified: Secondary | ICD-10-CM | POA: Diagnosis not present

## 2017-09-03 ENCOUNTER — Ambulatory Visit (INDEPENDENT_AMBULATORY_CARE_PROVIDER_SITE_OTHER): Payer: Medicare Other | Admitting: *Deleted

## 2017-09-03 DIAGNOSIS — I495 Sick sinus syndrome: Secondary | ICD-10-CM

## 2017-09-04 ENCOUNTER — Telehealth: Payer: Self-pay | Admitting: Cardiovascular Disease

## 2017-09-04 ENCOUNTER — Encounter: Payer: Self-pay | Admitting: Cardiology

## 2017-09-04 DIAGNOSIS — Z7982 Long term (current) use of aspirin: Secondary | ICD-10-CM | POA: Diagnosis not present

## 2017-09-04 DIAGNOSIS — I5020 Unspecified systolic (congestive) heart failure: Secondary | ICD-10-CM | POA: Diagnosis not present

## 2017-09-04 DIAGNOSIS — S42302D Unspecified fracture of shaft of humerus, left arm, subsequent encounter for fracture with routine healing: Secondary | ICD-10-CM | POA: Diagnosis not present

## 2017-09-04 DIAGNOSIS — Z951 Presence of aortocoronary bypass graft: Secondary | ICD-10-CM | POA: Diagnosis not present

## 2017-09-04 DIAGNOSIS — E785 Hyperlipidemia, unspecified: Secondary | ICD-10-CM | POA: Diagnosis not present

## 2017-09-04 DIAGNOSIS — J45909 Unspecified asthma, uncomplicated: Secondary | ICD-10-CM | POA: Diagnosis not present

## 2017-09-04 DIAGNOSIS — I2510 Atherosclerotic heart disease of native coronary artery without angina pectoris: Secondary | ICD-10-CM | POA: Diagnosis not present

## 2017-09-04 DIAGNOSIS — N183 Chronic kidney disease, stage 3 (moderate): Secondary | ICD-10-CM | POA: Diagnosis not present

## 2017-09-04 DIAGNOSIS — D649 Anemia, unspecified: Secondary | ICD-10-CM | POA: Diagnosis not present

## 2017-09-04 DIAGNOSIS — I130 Hypertensive heart and chronic kidney disease with heart failure and stage 1 through stage 4 chronic kidney disease, or unspecified chronic kidney disease: Secondary | ICD-10-CM | POA: Diagnosis not present

## 2017-09-04 DIAGNOSIS — I739 Peripheral vascular disease, unspecified: Secondary | ICD-10-CM | POA: Diagnosis not present

## 2017-09-04 DIAGNOSIS — Z952 Presence of prosthetic heart valve: Secondary | ICD-10-CM | POA: Diagnosis not present

## 2017-09-04 DIAGNOSIS — Z950 Presence of cardiac pacemaker: Secondary | ICD-10-CM | POA: Diagnosis not present

## 2017-09-04 DIAGNOSIS — Z9181 History of falling: Secondary | ICD-10-CM | POA: Diagnosis not present

## 2017-09-04 NOTE — Progress Notes (Signed)
Cardiology Office Note:    Date:  09/05/2017   ID:  HARTLEY WYKE, DOB [birth year], MRN [id number]  PCP:  Jani Gravel, MD  Cardiologist:  Sanda Klein, MD   Referring MD: Jani Gravel, MD   Chief Complaint  Patient presents with  . Follow-up    91 day PPM.  . Edema    Ankles.    History of Present Illness:    Holly Bolton is a 84 y.o. female with a hx of CAD s/p CABG (LIMA-LAD) and AVR (1989), SSS s/p dual chamber PPM (2010), HTN, PAD, and HLD. She was recently seen in the hospital for mildly elevated troponin after a fall and left humeral fracture. Stress test was recommended, but canceled by son. Medical management was recommended. She tolerated surgery well and was discharged to a SNF.   Since that time, she has done well from a cardiac perspective. She presents in a wheelchair with her arm in a sling. She is here with her son. She has no cardiac complaints, no problems breathing, no chest pain. We discussed her troponin of 0.6. She and her son do not want to pursue invasive testing or procedures at this time. She is still recovering from arm surgery. They are inquiring about PPM transmission. Transmission received 09/02/17.    Past Medical History:  Diagnosis Date  . Aneurysm (arteriovenous) of coronary vessels    ascending aorta requiring bypass of the LAD  . Asthma   . Carotid stenosis 04/28/08   Doppler: <40% stenosis bilateral  . Hyperlipemia   . Hypertension   . Pacemaker generator end of life 11/18/08   Intermittent high-grade atrioventricular block  . Peripheral arterial disease (HCC)    left ABI of 0.78  . Presence of permanent cardiac pacemaker 08/26/01   Sinus node dysfunction-[place]  . Status post ascending aortic aneurysm repair/AVR -  Medtronic Freestyle root 09/28/2014   ascending aorta requiring bypass of the LAD     Past Surgical History:  Procedure Laterality Date  . ABDOMINAL HYSTERECTOMY  1966  . APPENDECTOMY  1943  . ASCENDING AORTIC ANEURYSM REPAIR   09/22/97   porcine aortic root  . BREAST FIBROADENOMA SURGERY  11/89  . CARDIAC SURGERY    . CORONARY ARTERY BYPASS GRAFT  09/22/97   LIMA to the LAD  . I&D EXTREMITY Left 08/15/2017   Procedure: IRRIGATION AND DEBRIDEMENT EXTREMITY;  Surgeon: Nicholes Stairs, MD;  Location: [facility];  Service: Orthopedics;  Laterality: Left;  . [place]   left  . NEPHRECTOMY  1958   right  . ORIF HUMERUS FRACTURE Left 08/15/2017   Procedure: OPEN REDUCTION INTERNAL FIXATION (ORIF) PROXIMAL HUMERUS FRACTURE;  Surgeon: Nicholes Stairs, MD;  Location: [facility];  Service: Orthopedics;  Laterality: Left;  . OVARIAN CYST SURGERY  1948  . PACEMAKER GENERATOR CHANGE  11/18/08   [place]  . PACEMAKER INSERTION  08/26/01   [place]  . PPM GENERATOR CHANGEOUT N/A 05/16/2017   Procedure: PPM GENERATOR CHANGEOUT;  Surgeon: Sanda Klein, MD;  Location: [facility];  Service: Cardiovascular;  Laterality: N/A;    Current Medications: Current Meds  Medication Sig  . aspirin 81 MG tablet Take 81 mg by mouth daily as needed for pain.   . brimonidine (ALPHAGAN) 0.2 % ophthalmic solution instill ONE DROP IN [facility] EYE TWICE DAILY  . carvedilol (COREG) 6.25 MG tablet Take 1 tablet (6.25 mg total) by mouth 2 (two) times daily with a meal.  .  Coenzyme Q10 (COQ10) 200 MG CAPS Take 200 mg by mouth daily.  . furosemide (LASIX) 20 MG tablet Take 1 tablet (20 mg total) by mouth 2 (two) times a week. Mondays and Fridays  . Magnesium 250 MG TABS Take 1 tablet (250 mg total) by mouth 2 (two) times daily.  Marland Kitchen olmesartan (BENICAR) 40 MG tablet Take 40 mg by mouth daily.  . vitamin B-12 (CYANOCOBALAMIN) 1000 MCG tablet Take 1,000 mcg by mouth 2 (two) times daily.      Allergies:   Accupril [quinapril hcl]; Amlodipine; Benadryl [diphenhydramine hcl]; Biaxin [clarithromycin]; Ciprofloxacin; Codeine; Diovan [valsartan]; Medrol [methylprednisolone]; Morphine and related; Neurontin [gabapentin]; and Penicillins    Social History   Socioeconomic History  . Marital status: Widowed    Spouse name: Not on file  . Number of children: 1  . Years of education: 16  . Highest education level: Not on file  Occupational History  . Occupation: Retired  Scientific laboratory technician  . Financial resource strain: Not on file  . Food insecurity:    Worry: Not on file    Inability: Not on file  . Transportation needs:    Medical: Not on file    Non-medical: Not on file  Tobacco Use  . Smoking status: Never Smoker  . Smokeless tobacco: Never Used  Substance and Sexual Activity  . Alcohol use: No  . Drug use: No  . Sexual activity: Not on file  Lifestyle  . Physical activity:    Days per week: Not on file    Minutes per session: Not on file  . Stress: Not on file  Relationships  . Social connections:    Talks on phone: Not on file    Gets together: Not on file    Attends religious service: Not on file    Active member of club or organization: Not on file    Attends meetings of clubs or organizations: Not on file    Relationship status: Not on file  Other Topics Concern  . Not on file  Social History Narrative   Lives with son, Algis Greenhouse   Caffeine use: 1 cup coffee per day   Right handed     Family History: The patient's family history includes Heart attack in her brother, father, and mother; Hyperlipidemia in her sister; Hypertension in her sister.  ROS:   Please see the history of present illness.    All other systems reviewed and are negative.  EKGs/Labs/Other Studies Reviewed:    The following studies were reviewed today:   Echo 05/16/17: Study Conclusions - Left ventricle: The cavity size was normal. Wall thickness was   increased in a pattern of moderate LVH. Systolic function was   normal. The estimated ejection fraction was in the range of 50%   to 55%. The study was not technically sufficient to allow   evaluation of LV diastolic dysfunction due to atrial   fibrillation. - Aortic  valve: Moderately calcified annulus. Trileaflet;   moderately thickened leaflets. Valve area (VTI): 2.53 cm^2. Valve   area (Vmax): 2.53 cm^2. Valve area (Vmean): 2.61 cm^2. - Mitral valve: Mildly calcified annulus. Mildly thickened leaflets   . There was mild to moderate regurgitation. - Left atrium: The atrium was severely dilated. - Right ventricle: The cavity size was mildly dilated. - Right atrium: The atrium was moderately dilated. - Tricuspid valve: There was moderate regurgitation. - Pulmonary arteries: Systolic pressure was moderately increased.   PA peak pressure: 58 mm Hg (S). - Technically  difficult study.   EKG:  EKG is not ordered today.   Recent Labs: 08/20/2017: BUN 27; Creatinine, Ser 1.30; Hemoglobin 9.3; Platelets 197; Potassium 4.3; Sodium 140  Recent Lipid Panel No results found for: CHOL, TRIG, HDL, CHOLHDL, VLDL, LDLCALC, LDLDIRECT  Physical Exam:    VS:  BP (!) 140/50 (BP Location: Right Arm, Patient Position: Sitting, Cuff Size: Normal)   Pulse 64   Ht 5\' 3"  (1.6 m)   Wt 96 lb (43.5 kg)   BMI 17.01 kg/m     Wt Readings from Last 3 Encounters:  09/05/17 96 lb (43.5 kg)  08/15/17 100 lb (45.4 kg)  05/16/17 100 lb (45.4 kg)     GEN: Well nourished, well developed in no acute distress HEENT: Normal NECK: No JVD; No carotid bruits LYMPHATICS: No lymphadenopathy CARDIAC: RRR, no murmurs, rubs, gallops RESPIRATORY:  Clear to auscultation without rales, wheezing or rhonchi, slightly diminished in bases ABDOMEN: Soft, non-tender, non-distended MUSCULOSKELETAL:  trace edema; No deformity  SKIN: Warm and dry NEUROLOGIC:  Alert and oriented x 3 PSYCHIATRIC:  Normal affect   ASSESSMENT:    1. Coronary artery disease involving native coronary artery of native heart without angina pectoris   2. SSS (sick sinus syndrome) ([place])   3. Essential hypertension   4. Status post ascending aortic aneurysm repair/AVR -  Medtronic Freestyle root    PLAN:    In  order of problems listed above:  Coronary artery disease involving native coronary artery of native heart without angina pectoris Elevated troponin in setting of fall and arm fracture. Stress test was deferred by patient and family. She is not interested in invasive testing at this time. She denies chest pain, shortness of breath, DOE, and palpitations. No medication changes.  SSS (sick sinus syndrome) ([place]) Generator change 05/2017 I do not see transmission details in Epic. Will forward my note to Dr. Sallyanne Kuster for review. Next transmission is scheduled for 11/2017.  Essential hypertension No medication changes. She is not dizzy or lightheaded with marginal pressure of 96/62.  Status post ascending aortic aneurysm repair/AVR -  Medtronic Freestyle root Stable.  Medication Adjustments/Labs and Tests Ordered: Current medicines are reviewed at length with the patient today.  Concerns regarding medicines are outlined above.  No orders of the defined types were placed in this encounter.  No orders of the defined types were placed in this encounter.   Signed, Ledora Bottcher, PA  09/05/2017 2:02 PM    [facility]

## 2017-09-04 NOTE — Telephone Encounter (Signed)
New Message:    He needs pt diagnosis specifically for CHF please.

## 2017-09-04 NOTE — Telephone Encounter (Signed)
Called Jim with [facility], he stated that referral paperwork had that patient had CHF and patient was confused as she has never heard of that before. I advised with Clair Gulling there was no active diagnosis of CHF on patients chart. He asked that if that were to change after her appointment tomorrow to give him a call.   No other questions or concerns.

## 2017-09-04 NOTE — Progress Notes (Signed)
Remote pacemaker transmission.   

## 2017-09-05 ENCOUNTER — Ambulatory Visit (INDEPENDENT_AMBULATORY_CARE_PROVIDER_SITE_OTHER): Payer: Medicare Other | Admitting: Physician Assistant

## 2017-09-05 ENCOUNTER — Encounter: Payer: Self-pay | Admitting: Physician Assistant

## 2017-09-05 VITALS — BP 140/50 | HR 64 | Ht 63.0 in | Wt 96.0 lb

## 2017-09-05 DIAGNOSIS — I2510 Atherosclerotic heart disease of native coronary artery without angina pectoris: Secondary | ICD-10-CM

## 2017-09-05 DIAGNOSIS — Z8679 Personal history of other diseases of the circulatory system: Secondary | ICD-10-CM | POA: Diagnosis not present

## 2017-09-05 DIAGNOSIS — Z9889 Other specified postprocedural states: Secondary | ICD-10-CM

## 2017-09-05 DIAGNOSIS — I10 Essential (primary) hypertension: Secondary | ICD-10-CM

## 2017-09-05 DIAGNOSIS — I495 Sick sinus syndrome: Secondary | ICD-10-CM | POA: Diagnosis not present

## 2017-09-05 NOTE — Progress Notes (Signed)
Thank you, Angie MCr

## 2017-09-05 NOTE — Patient Instructions (Signed)
Medication Instructions:  Your physician recommends that you continue on your current medications as directed. Please refer to the Current Medication list given to you today.   Labwork: None ordered  Testing/Procedures: None ordered  Follow-Up: Your physician wants you to follow-up in: [place] will receive a reminder letter in the mail two months in advance. If you don't receive a letter, please call our office to schedule the follow-up appointment.   Any Other Special Instructions Will Be Listed Below (If Applicable).     If you need a refill on your cardiac medications before your next appointment, please call your pharmacy.

## 2017-09-06 DIAGNOSIS — I5020 Unspecified systolic (congestive) heart failure: Secondary | ICD-10-CM | POA: Diagnosis not present

## 2017-09-06 DIAGNOSIS — S42302D Unspecified fracture of shaft of humerus, left arm, subsequent encounter for fracture with routine healing: Secondary | ICD-10-CM | POA: Diagnosis not present

## 2017-09-06 DIAGNOSIS — I2510 Atherosclerotic heart disease of native coronary artery without angina pectoris: Secondary | ICD-10-CM | POA: Diagnosis not present

## 2017-09-06 DIAGNOSIS — J45909 Unspecified asthma, uncomplicated: Secondary | ICD-10-CM | POA: Diagnosis not present

## 2017-09-06 DIAGNOSIS — N183 Chronic kidney disease, stage 3 (moderate): Secondary | ICD-10-CM | POA: Diagnosis not present

## 2017-09-06 DIAGNOSIS — I130 Hypertensive heart and chronic kidney disease with heart failure and stage 1 through stage 4 chronic kidney disease, or unspecified chronic kidney disease: Secondary | ICD-10-CM | POA: Diagnosis not present

## 2017-09-07 DIAGNOSIS — J45909 Unspecified asthma, uncomplicated: Secondary | ICD-10-CM | POA: Diagnosis not present

## 2017-09-07 DIAGNOSIS — I5020 Unspecified systolic (congestive) heart failure: Secondary | ICD-10-CM | POA: Diagnosis not present

## 2017-09-07 DIAGNOSIS — I130 Hypertensive heart and chronic kidney disease with heart failure and stage 1 through stage 4 chronic kidney disease, or unspecified chronic kidney disease: Secondary | ICD-10-CM | POA: Diagnosis not present

## 2017-09-07 DIAGNOSIS — S42302D Unspecified fracture of shaft of humerus, left arm, subsequent encounter for fracture with routine healing: Secondary | ICD-10-CM | POA: Diagnosis not present

## 2017-09-07 DIAGNOSIS — N183 Chronic kidney disease, stage 3 (moderate): Secondary | ICD-10-CM | POA: Diagnosis not present

## 2017-09-07 DIAGNOSIS — I2510 Atherosclerotic heart disease of native coronary artery without angina pectoris: Secondary | ICD-10-CM | POA: Diagnosis not present

## 2017-09-11 DIAGNOSIS — I5020 Unspecified systolic (congestive) heart failure: Secondary | ICD-10-CM | POA: Diagnosis not present

## 2017-09-11 DIAGNOSIS — I130 Hypertensive heart and chronic kidney disease with heart failure and stage 1 through stage 4 chronic kidney disease, or unspecified chronic kidney disease: Secondary | ICD-10-CM | POA: Diagnosis not present

## 2017-09-11 DIAGNOSIS — I2510 Atherosclerotic heart disease of native coronary artery without angina pectoris: Secondary | ICD-10-CM | POA: Diagnosis not present

## 2017-09-11 DIAGNOSIS — J45909 Unspecified asthma, uncomplicated: Secondary | ICD-10-CM | POA: Diagnosis not present

## 2017-09-11 DIAGNOSIS — N183 Chronic kidney disease, stage 3 (moderate): Secondary | ICD-10-CM | POA: Diagnosis not present

## 2017-09-11 DIAGNOSIS — S42302D Unspecified fracture of shaft of humerus, left arm, subsequent encounter for fracture with routine healing: Secondary | ICD-10-CM | POA: Diagnosis not present

## 2017-09-12 DIAGNOSIS — J45909 Unspecified asthma, uncomplicated: Secondary | ICD-10-CM | POA: Diagnosis not present

## 2017-09-12 DIAGNOSIS — S42302D Unspecified fracture of shaft of humerus, left arm, subsequent encounter for fracture with routine healing: Secondary | ICD-10-CM | POA: Diagnosis not present

## 2017-09-12 DIAGNOSIS — I2510 Atherosclerotic heart disease of native coronary artery without angina pectoris: Secondary | ICD-10-CM | POA: Diagnosis not present

## 2017-09-12 DIAGNOSIS — N183 Chronic kidney disease, stage 3 (moderate): Secondary | ICD-10-CM | POA: Diagnosis not present

## 2017-09-12 DIAGNOSIS — I130 Hypertensive heart and chronic kidney disease with heart failure and stage 1 through stage 4 chronic kidney disease, or unspecified chronic kidney disease: Secondary | ICD-10-CM | POA: Diagnosis not present

## 2017-09-12 DIAGNOSIS — I5020 Unspecified systolic (congestive) heart failure: Secondary | ICD-10-CM | POA: Diagnosis not present

## 2017-09-13 DIAGNOSIS — I10 Essential (primary) hypertension: Secondary | ICD-10-CM | POA: Diagnosis not present

## 2017-09-13 DIAGNOSIS — E0590 Thyrotoxicosis, unspecified without thyrotoxic crisis or storm: Secondary | ICD-10-CM | POA: Diagnosis not present

## 2017-09-13 LAB — CBC AND DIFFERENTIAL
HCT: 31 — AB (ref 36–46)
Hemoglobin: 9.8 — AB (ref 12.0–16.0)
Platelets: 191 (ref 150–399)
WBC: 5.4

## 2017-09-13 LAB — LIPID PANEL
Cholesterol: 153 (ref 0–200)
HDL: 49 (ref 35–70)
LDL Cholesterol: 92
Triglycerides: 62 (ref 40–160)

## 2017-09-13 LAB — BASIC METABOLIC PANEL
BUN: 43 — AB (ref 4–21)
Creatinine: 1.4 — AB (ref 0.5–1.1)
Glucose: 101
Potassium: 4.7 (ref 3.4–5.3)
Sodium: 141 (ref 137–147)

## 2017-09-13 LAB — HEPATIC FUNCTION PANEL
ALT: 16 (ref 7–35)
AST: 15 (ref 13–35)
Alkaline Phosphatase: 177 — AB (ref 25–125)
Bilirubin, Total: 0.7

## 2017-09-13 LAB — TSH: TSH: 0.07 — AB (ref 0.41–5.90)

## 2017-09-13 LAB — VITAMIN B12: Vitamin B-12: 914

## 2017-09-14 DIAGNOSIS — N183 Chronic kidney disease, stage 3 (moderate): Secondary | ICD-10-CM | POA: Diagnosis not present

## 2017-09-14 DIAGNOSIS — S42302D Unspecified fracture of shaft of humerus, left arm, subsequent encounter for fracture with routine healing: Secondary | ICD-10-CM | POA: Diagnosis not present

## 2017-09-14 DIAGNOSIS — I130 Hypertensive heart and chronic kidney disease with heart failure and stage 1 through stage 4 chronic kidney disease, or unspecified chronic kidney disease: Secondary | ICD-10-CM | POA: Diagnosis not present

## 2017-09-14 DIAGNOSIS — J45909 Unspecified asthma, uncomplicated: Secondary | ICD-10-CM | POA: Diagnosis not present

## 2017-09-14 DIAGNOSIS — I2510 Atherosclerotic heart disease of native coronary artery without angina pectoris: Secondary | ICD-10-CM | POA: Diagnosis not present

## 2017-09-14 DIAGNOSIS — I5020 Unspecified systolic (congestive) heart failure: Secondary | ICD-10-CM | POA: Diagnosis not present

## 2017-09-17 DIAGNOSIS — I130 Hypertensive heart and chronic kidney disease with heart failure and stage 1 through stage 4 chronic kidney disease, or unspecified chronic kidney disease: Secondary | ICD-10-CM | POA: Diagnosis not present

## 2017-09-17 DIAGNOSIS — S42302D Unspecified fracture of shaft of humerus, left arm, subsequent encounter for fracture with routine healing: Secondary | ICD-10-CM | POA: Diagnosis not present

## 2017-09-17 DIAGNOSIS — N183 Chronic kidney disease, stage 3 (moderate): Secondary | ICD-10-CM | POA: Diagnosis not present

## 2017-09-17 DIAGNOSIS — I5020 Unspecified systolic (congestive) heart failure: Secondary | ICD-10-CM | POA: Diagnosis not present

## 2017-09-17 DIAGNOSIS — J45909 Unspecified asthma, uncomplicated: Secondary | ICD-10-CM | POA: Diagnosis not present

## 2017-09-17 DIAGNOSIS — I2510 Atherosclerotic heart disease of native coronary artery without angina pectoris: Secondary | ICD-10-CM | POA: Diagnosis not present

## 2017-09-18 DIAGNOSIS — I5020 Unspecified systolic (congestive) heart failure: Secondary | ICD-10-CM | POA: Diagnosis not present

## 2017-09-18 DIAGNOSIS — N183 Chronic kidney disease, stage 3 (moderate): Secondary | ICD-10-CM | POA: Diagnosis not present

## 2017-09-18 DIAGNOSIS — I130 Hypertensive heart and chronic kidney disease with heart failure and stage 1 through stage 4 chronic kidney disease, or unspecified chronic kidney disease: Secondary | ICD-10-CM | POA: Diagnosis not present

## 2017-09-18 DIAGNOSIS — I2510 Atherosclerotic heart disease of native coronary artery without angina pectoris: Secondary | ICD-10-CM | POA: Diagnosis not present

## 2017-09-18 DIAGNOSIS — J45909 Unspecified asthma, uncomplicated: Secondary | ICD-10-CM | POA: Diagnosis not present

## 2017-09-18 DIAGNOSIS — S42302D Unspecified fracture of shaft of humerus, left arm, subsequent encounter for fracture with routine healing: Secondary | ICD-10-CM | POA: Diagnosis not present

## 2017-09-19 DIAGNOSIS — I5020 Unspecified systolic (congestive) heart failure: Secondary | ICD-10-CM | POA: Diagnosis not present

## 2017-09-19 DIAGNOSIS — I2510 Atherosclerotic heart disease of native coronary artery without angina pectoris: Secondary | ICD-10-CM | POA: Diagnosis not present

## 2017-09-19 DIAGNOSIS — I130 Hypertensive heart and chronic kidney disease with heart failure and stage 1 through stage 4 chronic kidney disease, or unspecified chronic kidney disease: Secondary | ICD-10-CM | POA: Diagnosis not present

## 2017-09-19 DIAGNOSIS — N183 Chronic kidney disease, stage 3 (moderate): Secondary | ICD-10-CM | POA: Diagnosis not present

## 2017-09-19 DIAGNOSIS — J45909 Unspecified asthma, uncomplicated: Secondary | ICD-10-CM | POA: Diagnosis not present

## 2017-09-19 DIAGNOSIS — S42302D Unspecified fracture of shaft of humerus, left arm, subsequent encounter for fracture with routine healing: Secondary | ICD-10-CM | POA: Diagnosis not present

## 2017-09-20 DIAGNOSIS — J45909 Unspecified asthma, uncomplicated: Secondary | ICD-10-CM | POA: Diagnosis not present

## 2017-09-20 DIAGNOSIS — I10 Essential (primary) hypertension: Secondary | ICD-10-CM | POA: Diagnosis not present

## 2017-09-20 DIAGNOSIS — I5020 Unspecified systolic (congestive) heart failure: Secondary | ICD-10-CM | POA: Diagnosis not present

## 2017-09-20 DIAGNOSIS — M810 Age-related osteoporosis without current pathological fracture: Secondary | ICD-10-CM | POA: Diagnosis not present

## 2017-09-20 DIAGNOSIS — I130 Hypertensive heart and chronic kidney disease with heart failure and stage 1 through stage 4 chronic kidney disease, or unspecified chronic kidney disease: Secondary | ICD-10-CM | POA: Diagnosis not present

## 2017-09-20 DIAGNOSIS — E0590 Thyrotoxicosis, unspecified without thyrotoxic crisis or storm: Secondary | ICD-10-CM | POA: Diagnosis not present

## 2017-09-20 DIAGNOSIS — N183 Chronic kidney disease, stage 3 (moderate): Secondary | ICD-10-CM | POA: Diagnosis not present

## 2017-09-20 DIAGNOSIS — I2510 Atherosclerotic heart disease of native coronary artery without angina pectoris: Secondary | ICD-10-CM | POA: Diagnosis not present

## 2017-09-20 DIAGNOSIS — S42302D Unspecified fracture of shaft of humerus, left arm, subsequent encounter for fracture with routine healing: Secondary | ICD-10-CM | POA: Diagnosis not present

## 2017-09-21 DIAGNOSIS — N183 Chronic kidney disease, stage 3 (moderate): Secondary | ICD-10-CM | POA: Diagnosis not present

## 2017-09-21 DIAGNOSIS — I5020 Unspecified systolic (congestive) heart failure: Secondary | ICD-10-CM | POA: Diagnosis not present

## 2017-09-21 DIAGNOSIS — S42302D Unspecified fracture of shaft of humerus, left arm, subsequent encounter for fracture with routine healing: Secondary | ICD-10-CM | POA: Diagnosis not present

## 2017-09-21 DIAGNOSIS — I130 Hypertensive heart and chronic kidney disease with heart failure and stage 1 through stage 4 chronic kidney disease, or unspecified chronic kidney disease: Secondary | ICD-10-CM | POA: Diagnosis not present

## 2017-09-21 DIAGNOSIS — J45909 Unspecified asthma, uncomplicated: Secondary | ICD-10-CM | POA: Diagnosis not present

## 2017-09-21 DIAGNOSIS — I2510 Atherosclerotic heart disease of native coronary artery without angina pectoris: Secondary | ICD-10-CM | POA: Diagnosis not present

## 2017-09-24 DIAGNOSIS — I130 Hypertensive heart and chronic kidney disease with heart failure and stage 1 through stage 4 chronic kidney disease, or unspecified chronic kidney disease: Secondary | ICD-10-CM | POA: Diagnosis not present

## 2017-09-24 DIAGNOSIS — I5020 Unspecified systolic (congestive) heart failure: Secondary | ICD-10-CM | POA: Diagnosis not present

## 2017-09-24 DIAGNOSIS — J45909 Unspecified asthma, uncomplicated: Secondary | ICD-10-CM | POA: Diagnosis not present

## 2017-09-24 DIAGNOSIS — I2510 Atherosclerotic heart disease of native coronary artery without angina pectoris: Secondary | ICD-10-CM | POA: Diagnosis not present

## 2017-09-24 DIAGNOSIS — N183 Chronic kidney disease, stage 3 (moderate): Secondary | ICD-10-CM | POA: Diagnosis not present

## 2017-09-24 DIAGNOSIS — S42302D Unspecified fracture of shaft of humerus, left arm, subsequent encounter for fracture with routine healing: Secondary | ICD-10-CM | POA: Diagnosis not present

## 2017-09-25 DIAGNOSIS — S42302D Unspecified fracture of shaft of humerus, left arm, subsequent encounter for fracture with routine healing: Secondary | ICD-10-CM | POA: Diagnosis not present

## 2017-09-25 DIAGNOSIS — I2510 Atherosclerotic heart disease of native coronary artery without angina pectoris: Secondary | ICD-10-CM | POA: Diagnosis not present

## 2017-09-25 DIAGNOSIS — I5020 Unspecified systolic (congestive) heart failure: Secondary | ICD-10-CM | POA: Diagnosis not present

## 2017-09-25 DIAGNOSIS — I130 Hypertensive heart and chronic kidney disease with heart failure and stage 1 through stage 4 chronic kidney disease, or unspecified chronic kidney disease: Secondary | ICD-10-CM | POA: Diagnosis not present

## 2017-09-25 DIAGNOSIS — N183 Chronic kidney disease, stage 3 (moderate): Secondary | ICD-10-CM | POA: Diagnosis not present

## 2017-09-25 DIAGNOSIS — J45909 Unspecified asthma, uncomplicated: Secondary | ICD-10-CM | POA: Diagnosis not present

## 2017-09-26 DIAGNOSIS — J45909 Unspecified asthma, uncomplicated: Secondary | ICD-10-CM | POA: Diagnosis not present

## 2017-09-26 DIAGNOSIS — N183 Chronic kidney disease, stage 3 (moderate): Secondary | ICD-10-CM | POA: Diagnosis not present

## 2017-09-26 DIAGNOSIS — S42302D Unspecified fracture of shaft of humerus, left arm, subsequent encounter for fracture with routine healing: Secondary | ICD-10-CM | POA: Diagnosis not present

## 2017-09-26 DIAGNOSIS — I130 Hypertensive heart and chronic kidney disease with heart failure and stage 1 through stage 4 chronic kidney disease, or unspecified chronic kidney disease: Secondary | ICD-10-CM | POA: Diagnosis not present

## 2017-09-26 DIAGNOSIS — I2510 Atherosclerotic heart disease of native coronary artery without angina pectoris: Secondary | ICD-10-CM | POA: Diagnosis not present

## 2017-09-26 DIAGNOSIS — I5020 Unspecified systolic (congestive) heart failure: Secondary | ICD-10-CM | POA: Diagnosis not present

## 2017-09-27 DIAGNOSIS — Z4789 Encounter for other orthopedic aftercare: Secondary | ICD-10-CM | POA: Diagnosis not present

## 2017-09-27 DIAGNOSIS — I2510 Atherosclerotic heart disease of native coronary artery without angina pectoris: Secondary | ICD-10-CM | POA: Diagnosis not present

## 2017-09-27 DIAGNOSIS — S42302D Unspecified fracture of shaft of humerus, left arm, subsequent encounter for fracture with routine healing: Secondary | ICD-10-CM | POA: Diagnosis not present

## 2017-09-27 DIAGNOSIS — N183 Chronic kidney disease, stage 3 (moderate): Secondary | ICD-10-CM | POA: Diagnosis not present

## 2017-09-27 DIAGNOSIS — I5020 Unspecified systolic (congestive) heart failure: Secondary | ICD-10-CM | POA: Diagnosis not present

## 2017-09-27 DIAGNOSIS — J45909 Unspecified asthma, uncomplicated: Secondary | ICD-10-CM | POA: Diagnosis not present

## 2017-09-27 DIAGNOSIS — I130 Hypertensive heart and chronic kidney disease with heart failure and stage 1 through stage 4 chronic kidney disease, or unspecified chronic kidney disease: Secondary | ICD-10-CM | POA: Diagnosis not present

## 2017-09-28 DIAGNOSIS — N183 Chronic kidney disease, stage 3 (moderate): Secondary | ICD-10-CM | POA: Diagnosis not present

## 2017-09-28 DIAGNOSIS — I130 Hypertensive heart and chronic kidney disease with heart failure and stage 1 through stage 4 chronic kidney disease, or unspecified chronic kidney disease: Secondary | ICD-10-CM | POA: Diagnosis not present

## 2017-09-28 DIAGNOSIS — S42302D Unspecified fracture of shaft of humerus, left arm, subsequent encounter for fracture with routine healing: Secondary | ICD-10-CM | POA: Diagnosis not present

## 2017-09-28 DIAGNOSIS — I5020 Unspecified systolic (congestive) heart failure: Secondary | ICD-10-CM | POA: Diagnosis not present

## 2017-09-28 DIAGNOSIS — J45909 Unspecified asthma, uncomplicated: Secondary | ICD-10-CM | POA: Diagnosis not present

## 2017-09-28 DIAGNOSIS — I2510 Atherosclerotic heart disease of native coronary artery without angina pectoris: Secondary | ICD-10-CM | POA: Diagnosis not present

## 2017-09-30 LAB — CUP PACEART REMOTE DEVICE CHECK
Date Time Interrogation Session: 20190818165338
Implantable Lead Implant Date: 20030714
Implantable Lead Implant Date: 20030714
Implantable Lead Location: 753859
Implantable Lead Location: 753860
Implantable Pulse Generator Implant Date: 20190403
Pulse Gen Model: 2272
Pulse Gen Serial Number: 9004253

## 2017-10-01 DIAGNOSIS — I2510 Atherosclerotic heart disease of native coronary artery without angina pectoris: Secondary | ICD-10-CM | POA: Diagnosis not present

## 2017-10-01 DIAGNOSIS — N183 Chronic kidney disease, stage 3 (moderate): Secondary | ICD-10-CM | POA: Diagnosis not present

## 2017-10-01 DIAGNOSIS — I130 Hypertensive heart and chronic kidney disease with heart failure and stage 1 through stage 4 chronic kidney disease, or unspecified chronic kidney disease: Secondary | ICD-10-CM | POA: Diagnosis not present

## 2017-10-01 DIAGNOSIS — S42302D Unspecified fracture of shaft of humerus, left arm, subsequent encounter for fracture with routine healing: Secondary | ICD-10-CM | POA: Diagnosis not present

## 2017-10-01 DIAGNOSIS — I5020 Unspecified systolic (congestive) heart failure: Secondary | ICD-10-CM | POA: Diagnosis not present

## 2017-10-01 DIAGNOSIS — J45909 Unspecified asthma, uncomplicated: Secondary | ICD-10-CM | POA: Diagnosis not present

## 2017-10-02 DIAGNOSIS — N183 Chronic kidney disease, stage 3 (moderate): Secondary | ICD-10-CM | POA: Diagnosis not present

## 2017-10-02 DIAGNOSIS — S42302D Unspecified fracture of shaft of humerus, left arm, subsequent encounter for fracture with routine healing: Secondary | ICD-10-CM | POA: Diagnosis not present

## 2017-10-02 DIAGNOSIS — I2510 Atherosclerotic heart disease of native coronary artery without angina pectoris: Secondary | ICD-10-CM | POA: Diagnosis not present

## 2017-10-02 DIAGNOSIS — I5020 Unspecified systolic (congestive) heart failure: Secondary | ICD-10-CM | POA: Diagnosis not present

## 2017-10-02 DIAGNOSIS — I130 Hypertensive heart and chronic kidney disease with heart failure and stage 1 through stage 4 chronic kidney disease, or unspecified chronic kidney disease: Secondary | ICD-10-CM | POA: Diagnosis not present

## 2017-10-02 DIAGNOSIS — J45909 Unspecified asthma, uncomplicated: Secondary | ICD-10-CM | POA: Diagnosis not present

## 2017-10-05 DIAGNOSIS — I2510 Atherosclerotic heart disease of native coronary artery without angina pectoris: Secondary | ICD-10-CM | POA: Diagnosis not present

## 2017-10-05 DIAGNOSIS — J45909 Unspecified asthma, uncomplicated: Secondary | ICD-10-CM | POA: Diagnosis not present

## 2017-10-05 DIAGNOSIS — I5020 Unspecified systolic (congestive) heart failure: Secondary | ICD-10-CM | POA: Diagnosis not present

## 2017-10-05 DIAGNOSIS — S42302D Unspecified fracture of shaft of humerus, left arm, subsequent encounter for fracture with routine healing: Secondary | ICD-10-CM | POA: Diagnosis not present

## 2017-10-05 DIAGNOSIS — I130 Hypertensive heart and chronic kidney disease with heart failure and stage 1 through stage 4 chronic kidney disease, or unspecified chronic kidney disease: Secondary | ICD-10-CM | POA: Diagnosis not present

## 2017-10-05 DIAGNOSIS — N183 Chronic kidney disease, stage 3 (moderate): Secondary | ICD-10-CM | POA: Diagnosis not present

## 2017-10-07 ENCOUNTER — Emergency Department (HOSPITAL_COMMUNITY): Payer: Medicare Other

## 2017-10-07 ENCOUNTER — Emergency Department (HOSPITAL_COMMUNITY)
Admission: EM | Admit: 2017-10-07 | Discharge: 2017-10-07 | Disposition: A | Discharge status: Home / Self Care | Payer: Medicare Other | Attending: Emergency Medicine | Admitting: Emergency Medicine

## 2017-10-07 ENCOUNTER — Other Ambulatory Visit: Payer: Self-pay

## 2017-10-07 ENCOUNTER — Encounter (HOSPITAL_COMMUNITY): Payer: Self-pay | Admitting: Emergency Medicine

## 2017-10-07 DIAGNOSIS — S4992XA Unspecified injury of left shoulder and upper arm, initial encounter: Secondary | ICD-10-CM | POA: Diagnosis not present

## 2017-10-07 DIAGNOSIS — J45909 Unspecified asthma, uncomplicated: Secondary | ICD-10-CM | POA: Diagnosis not present

## 2017-10-07 DIAGNOSIS — I10 Essential (primary) hypertension: Secondary | ICD-10-CM | POA: Insufficient documentation

## 2017-10-07 DIAGNOSIS — M79622 Pain in left upper arm: Secondary | ICD-10-CM | POA: Diagnosis not present

## 2017-10-07 DIAGNOSIS — S299XXA Unspecified injury of thorax, initial encounter: Secondary | ICD-10-CM | POA: Diagnosis not present

## 2017-10-07 DIAGNOSIS — Z950 Presence of cardiac pacemaker: Secondary | ICD-10-CM | POA: Insufficient documentation

## 2017-10-07 DIAGNOSIS — R0789 Other chest pain: Secondary | ICD-10-CM

## 2017-10-07 DIAGNOSIS — Z79899 Other long term (current) drug therapy: Secondary | ICD-10-CM | POA: Diagnosis not present

## 2017-10-07 DIAGNOSIS — W19XXXA Unspecified fall, initial encounter: Secondary | ICD-10-CM

## 2017-10-07 DIAGNOSIS — Z7982 Long term (current) use of aspirin: Secondary | ICD-10-CM | POA: Diagnosis not present

## 2017-10-07 DIAGNOSIS — M25511 Pain in right shoulder: Secondary | ICD-10-CM | POA: Diagnosis not present

## 2017-10-07 DIAGNOSIS — R0781 Pleurodynia: Secondary | ICD-10-CM | POA: Diagnosis not present

## 2017-10-07 LAB — CBC WITH DIFFERENTIAL/PLATELET
Basophils Absolute: 0 10*3/uL (ref 0.0–0.1)
Basophils Relative: 0 %
Eosinophils Absolute: 0 10*3/uL (ref 0.0–0.7)
Eosinophils Relative: 0 %
HCT: 29.2 % — ABNORMAL LOW (ref 36.0–46.0)
Hemoglobin: 9.5 g/dL — ABNORMAL LOW (ref 12.0–15.0)
Lymphocytes Relative: 8 %
Lymphs Abs: 0.8 10*3/uL (ref 0.7–4.0)
MCH: 32 pg (ref 26.0–34.0)
MCHC: 32.5 g/dL (ref 30.0–36.0)
MCV: 98.3 fL (ref 78.0–100.0)
Monocytes Absolute: 1.1 10*3/uL — ABNORMAL HIGH (ref 0.1–1.0)
Monocytes Relative: 12 %
Neutro Abs: 7.4 10*3/uL (ref 1.7–7.7)
Neutrophils Relative %: 80 %
Platelets: 264 10*3/uL (ref 150–400)
RBC: 2.97 MIL/uL — ABNORMAL LOW (ref 3.87–5.11)
RDW: 17.8 % — ABNORMAL HIGH (ref 11.5–15.5)
WBC: 9.4 10*3/uL (ref 4.0–10.5)

## 2017-10-07 LAB — BASIC METABOLIC PANEL
Anion gap: 9 (ref 5–15)
BUN: 39 mg/dL — ABNORMAL HIGH (ref 8–23)
CO2: 25 mmol/L (ref 22–32)
Calcium: 9.3 mg/dL (ref 8.9–10.3)
Chloride: 108 mmol/L (ref 98–111)
Creatinine, Ser: 1.33 mg/dL — ABNORMAL HIGH (ref 0.44–1.00)
GFR calc Af Amer: 39 mL/min — ABNORMAL LOW (ref >60)
GFR calc non Af Amer: 34 mL/min — ABNORMAL LOW (ref >60)
Glucose, Bld: 124 mg/dL — ABNORMAL HIGH (ref 70–99)
Potassium: 4.6 mmol/L (ref 3.5–5.1)
Sodium: 142 mmol/L (ref 135–145)

## 2017-10-07 LAB — I-STAT TROPONIN, ED: Troponin i, poc: 0.06 ng/mL (ref 0.00–0.08)

## 2017-10-07 MED ORDER — BACITRACIN ZINC 500 UNIT/GM EX OINT
TOPICAL_OINTMENT | CUTANEOUS | Status: AC
  Administered 2017-10-07: 1
  Filled 2017-10-07: qty 0.9

## 2017-10-07 NOTE — ED Triage Notes (Signed)
Pt fell in kitchen today pt c/o skin tear to L forearm and chest pain, L shoulder pain. Pt denies syncope or dizziness, pt with hx (July) of L humerus fracture and pt's L arm / shoulder braced, bleeding at skin tear controlled.

## 2017-10-07 NOTE — ED Provider Notes (Signed)
Medical screening examination/treatment/procedure(s) were conducted as a shared visit with non-physician practitioner(s) and myself.  I personally evaluated the patient during the encounter.  Clinical Impression:   Final diagnoses:  Fall, initial encounter  Chest wall pain    The patient is a 84 year old female, she had a recent left proximal humerus injury which was reduced and she has been placed into a clamshell type immobilizer which she does not like however she ambulates around the house, often not using anything though she does have a wheelchair that she is supposed to be using.  She had a mechanical fall landing on her left side and complains of left shoulder and chest pain.  She has had a prior sternotomy, the scars well-healed, her EKG shows no signs of ischemia, there is a known right bundle branch block which is still persistent.  On exam she does have some tenderness but no obvious significant deformities about the left shoulder.  Her mid humerus does have a bit of a deformity.  Imaging pending, anticipate discharge, she is otherwise well-appearing.  She did not have chest pain until after the fall.  Discussed ortho care with ortho on call - agreeable that this humerus finding with the screws that were out of place has been determined and agreed upon with pt and family to be non surgical and to leave it alone.   EKG Interpretation  Date/Time:  Sunday October 07 2017 13:25:16 EDT Ventricular Rate:  77 PR Interval:    QRS Duration: 153 QT Interval:  449 QTC Calculation: 509 R Axis:   -133 Text Interpretation:  Sinus rhythm Short PR interval Right bundle branch block Abnormal T, consider ischemia, lateral leads arm lead reversal noted to be likely Confirmed by Noemi Chapel [id number]) on 10/07/2017 2:01:44 PM         Noemi Chapel, MD 10/09/17 1042

## 2017-10-07 NOTE — ED Provider Notes (Signed)
[facility] DEPT Provider Note   CSN: [id number] Arrival date & time: 10/07/17  1243     History   Chief Complaint Chief Complaint  Patient presents with  . Fall  . Abrasion  . Chest Pain    HPI Holly Bolton is a 84 y.o. female with a past medical history of PAD, hypertension, hyperlipidemia, CAD, who presents to ED for evaluation of unwitnessed mechanical fall that occurred prior to arrival at her home.  She was walking with her cane in her kitchen.  Apparently, she put her cane down to make herself a sandwich for lunch. Son states she has been "still unsteady on her feet since the surgery." She fell onto the floor in the kitchen.  Her son found her "halfway sitting up."  She is currently complaining of nondominant left posterior shoulder pain, left-sided chest pain.  Patient is status post ORIF for L proximal humerus fracture on 08/20/2017 by Dr. Stann Mainland.  For the past week, she has been in a clamshell brace at all times except for bathing. Prior to that, she was in a cast and sling.  Family was told that she needed 2-3 more weeks of immobilization because of some misalignment/loose screws.  Patient denies any head injury, loss of consciousness, vision changes, blood thinner use.  Prior to the fall, she was not having chest pain.  Denies any numbness in arms or legs, hip pain, neck or back pain, loss of bowel or bladder function different from baseline, vomiting, shortness of breath. Patient does not wear home oxygen.  HPI  Past Medical History:  Diagnosis Date  . Aneurysm (arteriovenous) of coronary vessels    ascending aorta requiring bypass of the LAD  . Asthma   . Carotid stenosis 04/28/08   Doppler: <40% stenosis bilateral  . Hyperlipemia   . Hypertension   . Pacemaker generator end of life 11/18/08   Intermittent high-grade atrioventricular block  . Peripheral arterial disease (HCC)    left ABI of 0.78  . Presence of permanent cardiac pacemaker  08/26/01   Sinus node dysfunction-[employer]  . Status post ascending aortic aneurysm repair/AVR -  Medtronic Freestyle root 09/28/2014   ascending aorta requiring bypass of the LAD     Patient Active Problem List   Diagnosis Date Noted  . Elevated troponin   . Asthma 08/15/2017  . Open left humeral fracture 08/15/2017  . Fracture 08/15/2017  . Pacemaker battery depletion 05/17/2017  . Leg cramps 06/12/2016  . Peripheral arterial disease ([place]) 12/08/2014  . Status post ascending aortic aneurysm repair/AVR -  Medtronic Freestyle root 09/28/2014  . CAD s/p CABG (LIMA to LAD) 07/08/2013  . History of aortic root repairand bioprosthetic AVR 07/08/2013  . Hyperlipidemia 07/08/2013  . SSS (sick sinus syndrome) ([place]) 07/08/2013  . Second degree AV block 07/08/2013  . Pacemaker, dual chamber [employer] Jude 2010 07/08/2013  . Essential hypertension 07/08/2013    Past Surgical History:  Procedure Laterality Date  . ABDOMINAL HYSTERECTOMY  1966  . APPENDECTOMY  1943  . ASCENDING AORTIC ANEURYSM REPAIR  09/22/97   porcine aortic root  . BREAST FIBROADENOMA SURGERY  11/89  . CARDIAC SURGERY    . CORONARY ARTERY BYPASS GRAFT  09/22/97   LIMA to the LAD  . I&D EXTREMITY Left 08/15/2017   Procedure: IRRIGATION AND DEBRIDEMENT EXTREMITY;  Surgeon: Nicholes Stairs, MD;  Location: [facility];  Service: Orthopedics;  Laterality: Left;  . [place]   left  .  NEPHRECTOMY  1958   right  . ORIF HUMERUS FRACTURE Left 08/15/2017   Procedure: OPEN REDUCTION INTERNAL FIXATION (ORIF) PROXIMAL HUMERUS FRACTURE;  Surgeon: Nicholes Stairs, MD;  Location: [facility];  Service: Orthopedics;  Laterality: Left;  . OVARIAN CYST SURGERY  1948  . PACEMAKER GENERATOR CHANGE  11/18/08   [employer]  . PACEMAKER INSERTION  08/26/01   [employer]  . PPM GENERATOR CHANGEOUT N/A 05/16/2017   Procedure: PPM GENERATOR CHANGEOUT;  Surgeon: Sanda Klein, MD;  Location: [facility];  Service: Cardiovascular;   Laterality: N/A;     OB History   None      Home Medications    Prior to Admission medications   Medication Sig Start Date End Date Taking? Authorizing Provider  aspirin 81 MG tablet Take 81 mg by mouth daily.    Yes [provider]  brimonidine (ALPHAGAN) 0.2 % ophthalmic solution instill ONE DROP IN [facility] EYE TWICE DAILY 03/31/16  Yes [provider]  carvedilol (COREG) 3.125 MG tablet Take 3.125 mg by mouth 2 (two) times daily with a meal.   Yes [provider]  Coenzyme Q10 (COQ10) 200 MG CAPS Take 200 mg by mouth daily.   Yes [provider]  furosemide (LASIX) 20 MG tablet Take 1 tablet (20 mg total) by mouth 2 (two) times a week. Mondays and Fridays 08/13/17  Yes Croitoru, Mihai, MD  olmesartan (BENICAR) 40 MG tablet Take 40 mg by mouth daily.   Yes [provider]  vitamin B-12 (CYANOCOBALAMIN) 1000 MCG tablet Take 1,000 mcg by mouth daily.    Yes [provider]  carvedilol (COREG) 6.25 MG tablet Take 1 tablet (6.25 mg total) by mouth 2 (two) times daily with a meal. Patient taking differently: Take 3.25 mg by mouth 2 (two) times daily with a meal.  08/20/17   Danford, Suann Larry, MD  Magnesium 250 MG TABS Take 1 tablet (250 mg total) by mouth 2 (two) times daily. Patient not taking: Reported on 10/07/2017 12/15/15   Croitoru, Dani Gobble, MD    Family History Family History  Problem Relation Age of Onset  . Heart attack Mother   . Heart attack Father   . Heart attack Brother   . Hyperlipidemia Sister   . Hypertension Sister     Social History Social History   Tobacco Use  . Smoking status: Never Smoker  . Smokeless tobacco: Never Used  Substance Use Topics  . Alcohol use: No  . Drug use: No     Allergies   Accupril [quinapril hcl]; Amlodipine; Benadryl [diphenhydramine hcl]; Biaxin [clarithromycin]; Ciprofloxacin; Codeine; Diovan [valsartan]; Medrol [methylprednisolone]; Morphine and related; Neurontin [gabapentin];  and Penicillins   Review of Systems Review of Systems  Constitutional: Negative for appetite change, chills and fever.  HENT: Negative for ear pain, rhinorrhea, sneezing and sore throat.   Eyes: Negative for photophobia and visual disturbance.  Respiratory: Negative for cough, chest tightness, shortness of breath and wheezing.   Cardiovascular: Positive for chest pain. Negative for palpitations.  Gastrointestinal: Negative for abdominal pain, blood in stool, constipation, diarrhea, nausea and vomiting.  Genitourinary: Negative for dysuria, hematuria and urgency.  Musculoskeletal: Positive for arthralgias and myalgias.  Skin: Negative for rash.  Neurological: Negative for dizziness, weakness and light-headedness.     Physical Exam Updated Vital Signs BP (!) 180/72 (BP Location: Right Arm)   Pulse 78   Temp 97.9 F (36.6 C) (Oral)   Resp 20   SpO2 94%   Physical  Exam  Constitutional: She is oriented to person, place, and time. She appears well-developed and well-nourished. No distress.  HENT:  Head: Normocephalic and atraumatic.  Nose: Nose normal.  Eyes: Pupils are equal, round, and reactive to light. Conjunctivae and EOM are normal. Right eye exhibits no discharge. Left eye exhibits no discharge. No scleral icterus.  Neck: Normal range of motion. Neck supple.  Cardiovascular: Normal rate, regular rhythm, normal heart sounds and intact distal pulses. Exam reveals no gallop and no friction rub.  No murmur heard. Pulmonary/Chest: Effort normal and breath sounds normal. No respiratory distress. She exhibits tenderness.    Abdominal: Soft. Bowel sounds are normal. She exhibits no distension. There is no tenderness. There is no guarding.  Musculoskeletal: Normal range of motion. She exhibits tenderness. She exhibits no edema.       Back:  No midline spinal tenderness present in lumbar, thoracic or cervical spine. No step-off palpated. No visible bruising, edema or temperature  change noted. No objective signs of numbness present. No saddle anesthesia. 2+ DP pulses bilaterally. Sensation intact to light touch. Strength 5/5 in bilateral lower extremities. No changes to ROM of R shoulder. Reports pain with ROM of L shoulder. Area is NVI.  Neurological: She is alert and oriented to person, place, and time. No cranial nerve deficit or sensory deficit. She exhibits normal muscle tone. Coordination normal.  Alert and oriented x4. Pupils reactive. No facial asymmetry noted. Cranial nerves appear grossly intact. Sensation intact to light touch on face, BUE and BLE.   Skin: Skin is warm and dry. Abrasion noted. No rash noted.  2 superficial skin tears noted to left wrist and hand.  Psychiatric: She has a normal mood and affect.  Nursing note and vitals reviewed.    ED Treatments / Results  Labs (all labs ordered are listed, but only abnormal results are displayed) Labs Reviewed  BASIC METABOLIC PANEL - Abnormal; Notable for the following components:      Result Value   Glucose, Bld 124 (*)    BUN 39 (*)    Creatinine, Ser 1.33 (*)    GFR calc non Af Amer 34 (*)    GFR calc Af Amer 39 (*)    All other components within normal limits  CBC WITH DIFFERENTIAL/PLATELET - Abnormal; Notable for the following components:   RBC 2.97 (*)    Hemoglobin 9.5 (*)    HCT 29.2 (*)    RDW 17.8 (*)    Monocytes Absolute 1.1 (*)    All other components within normal limits  I-STAT TROPONIN, ED    EKG EKG Interpretation  Date/Time:  Sunday October 07 2017 13:25:16 EDT Ventricular Rate:  77 PR Interval:    QRS Duration: 153 QT Interval:  449 QTC Calculation: 509 R Axis:   -133 Text Interpretation:  Sinus rhythm Short PR interval Right bundle branch block Abnormal T, consider ischemia, lateral leads arm lead reversal noted to be likely Confirmed by Noemi Chapel [phone]) on 10/07/2017 2:01:44 PM   Radiology Dg Ribs Unilateral W/chest Left  Result Date: 10/07/2017 CLINICAL  DATA:  [place] in the kitchen today. Left-sided chest and rib pain. EXAM: LEFT RIBS AND CHEST - 3+ VIEW COMPARISON:  08/15/2017 FINDINGS: Previous median sternotomy. Dual lead pacemaker. Right chest is clear. There may be volume loss in the left lower lobe. Rib films do not show a fracture. IMPRESSION: No rib fracture.  Possible atelectasis left lower lobe. Electronically Signed   By: Jan Fireman.D.  On: 10/07/2017 14:18   Dg Shoulder Left  Result Date: 10/07/2017 CLINICAL DATA:  [place] in the kitchen today.  Left-sided pain. EXAM: LEFT SHOULDER - 2+ VIEW COMPARISON:  08/15/2017 FINDINGS: There is no evidence of fracture or dislocation. There is no evidence of arthropathy or other focal bone abnormality. Soft tissues are unremarkable. IMPRESSION: Negative. Electronically Signed   By: Nelson Chimes M.D.   On: 10/07/2017 14:09   Dg Humerus Left  Result Date: 10/07/2017 CLINICAL DATA:  [place] in the kitchen today.  Left-sided pain. EXAM: LEFT HUMERUS - 2+ VIEW COMPARISON:  08/15/2017 FINDINGS: Unfavorable complications relating to the previous ORIF. The proximal 3 screws have come out, at least falling down superficial to the distal plate, presumably within a reactive pocket. The fracture site screw remains in place. The distal 3 screws remain in place. There has been lateral angulation at the fracture site. Extensive callus is present in the region of the fracture site and along the plate. I do not see an acute fracture. IMPRESSION: Complications at the site of previous ORIF. The 3 proximal screws have come out and fallen down anterior to the distal plate. There is lateral angulation at the fracture site. I do not see a finding that I can be certain is acute and related to the fall today. Electronically Signed   By: Nelson Chimes M.D.   On: 10/07/2017 14:17    Procedures Procedures (including critical care time)  Medications Ordered in ED Medications  bacitracin 500 UNIT/GM ointment (1 application  Given  07/21/35 1453)     Initial Impression / Assessment and Plan / ED Course  I have reviewed the triage vital signs and the nursing notes.  Pertinent labs & imaging results that were available during my care of the patient were reviewed by me and considered in my medical decision making (see chart for details).     84 year old female with a past medical history of CAD, PAD, status post left humerus ORIF on 7/8 for open fracture by Dr. Stann Mainland, who presents to ED for mechanical fall that occurred prior to arrival.  Son states that she is still unsteady on her feet after the accident.  She was in her kitchen trying to make a sandwich when she lost her balance and fell.  The fall was unwitnessed.  She denies any head injury or loss of consciousness.  She currently complains of left shoulder, arm and chest pain.  She did not have chest pain prior to the fall.  Patient currently has a clamshell brace on her left upper arm.  There are 2 skin abrasions noted on the left wrist.  Tenderness palpation of the left side of the chest.  No C, T,  Or L spine TTP at midline. No deficits on neurological exam noted.  Denies any vision changes or headache.  Denies any blood thinner use.  X-ray of the left side of the ribs, chest, shoulder returned as negative.  X-ray of the left humerus shows several loose screws and angulation at the fracture site.  There are no acute findings.  I spoke to Dr. Stann Mainland who states that he is aware of these findings.  There is no need for any intervention at this time. EKG with no changes from prior tracings. Troponin at baseline. BUN/Cr and H/H stable and similar to baseline.  Patient placed back in arm brace.  She is scheduled to meet with Dr. Stann Mainland for follow-up appointment in 2 weeks.  Will advise her to follow-up  with PCP as well.  Advised to return to ED for any severe worsening symptoms. Patient discussed with and seen by my attending, Dr. Sabra Heck.  Portions of this note were generated  with [employer]. Dictation errors may occur despite best attempts at proofreading.   Final Clinical Impressions(s) / ED Diagnoses   Final diagnoses:  Fall, initial encounter  Chest wall pain    ED Discharge Orders    None       Delia Heady, PA-C 10/07/17 1523    Noemi Chapel, MD 10/09/17 [phone]

## 2017-10-07 NOTE — Discharge Instructions (Signed)
Follow-up with your orthopedist and primary care provider for further evaluation. Return to ED for worsening symptoms, severe chest pain, headache, vision changes, additional injuries or falls. Continue your home medications as previously prescribed.

## 2017-10-09 DIAGNOSIS — N183 Chronic kidney disease, stage 3 (moderate): Secondary | ICD-10-CM | POA: Diagnosis not present

## 2017-10-09 DIAGNOSIS — I2510 Atherosclerotic heart disease of native coronary artery without angina pectoris: Secondary | ICD-10-CM | POA: Diagnosis not present

## 2017-10-09 DIAGNOSIS — J45909 Unspecified asthma, uncomplicated: Secondary | ICD-10-CM | POA: Diagnosis not present

## 2017-10-09 DIAGNOSIS — I130 Hypertensive heart and chronic kidney disease with heart failure and stage 1 through stage 4 chronic kidney disease, or unspecified chronic kidney disease: Secondary | ICD-10-CM | POA: Diagnosis not present

## 2017-10-09 DIAGNOSIS — I5020 Unspecified systolic (congestive) heart failure: Secondary | ICD-10-CM | POA: Diagnosis not present

## 2017-10-09 DIAGNOSIS — S42302D Unspecified fracture of shaft of humerus, left arm, subsequent encounter for fracture with routine healing: Secondary | ICD-10-CM | POA: Diagnosis not present

## 2017-10-10 DIAGNOSIS — I2510 Atherosclerotic heart disease of native coronary artery without angina pectoris: Secondary | ICD-10-CM | POA: Diagnosis not present

## 2017-10-10 DIAGNOSIS — I130 Hypertensive heart and chronic kidney disease with heart failure and stage 1 through stage 4 chronic kidney disease, or unspecified chronic kidney disease: Secondary | ICD-10-CM | POA: Diagnosis not present

## 2017-10-10 DIAGNOSIS — J45909 Unspecified asthma, uncomplicated: Secondary | ICD-10-CM | POA: Diagnosis not present

## 2017-10-10 DIAGNOSIS — N183 Chronic kidney disease, stage 3 (moderate): Secondary | ICD-10-CM | POA: Diagnosis not present

## 2017-10-10 DIAGNOSIS — I5020 Unspecified systolic (congestive) heart failure: Secondary | ICD-10-CM | POA: Diagnosis not present

## 2017-10-10 DIAGNOSIS — S42302D Unspecified fracture of shaft of humerus, left arm, subsequent encounter for fracture with routine healing: Secondary | ICD-10-CM | POA: Diagnosis not present

## 2017-10-11 DIAGNOSIS — R0602 Shortness of breath: Secondary | ICD-10-CM | POA: Diagnosis not present

## 2017-10-15 DIAGNOSIS — I2510 Atherosclerotic heart disease of native coronary artery without angina pectoris: Secondary | ICD-10-CM | POA: Diagnosis not present

## 2017-10-15 DIAGNOSIS — I130 Hypertensive heart and chronic kidney disease with heart failure and stage 1 through stage 4 chronic kidney disease, or unspecified chronic kidney disease: Secondary | ICD-10-CM | POA: Diagnosis not present

## 2017-10-15 DIAGNOSIS — N183 Chronic kidney disease, stage 3 (moderate): Secondary | ICD-10-CM | POA: Diagnosis not present

## 2017-10-15 DIAGNOSIS — S42302D Unspecified fracture of shaft of humerus, left arm, subsequent encounter for fracture with routine healing: Secondary | ICD-10-CM | POA: Diagnosis not present

## 2017-10-15 DIAGNOSIS — I5020 Unspecified systolic (congestive) heart failure: Secondary | ICD-10-CM | POA: Diagnosis not present

## 2017-10-15 DIAGNOSIS — J45909 Unspecified asthma, uncomplicated: Secondary | ICD-10-CM | POA: Diagnosis not present

## 2017-10-18 DIAGNOSIS — I130 Hypertensive heart and chronic kidney disease with heart failure and stage 1 through stage 4 chronic kidney disease, or unspecified chronic kidney disease: Secondary | ICD-10-CM | POA: Diagnosis not present

## 2017-10-18 DIAGNOSIS — S42302D Unspecified fracture of shaft of humerus, left arm, subsequent encounter for fracture with routine healing: Secondary | ICD-10-CM | POA: Diagnosis not present

## 2017-10-18 DIAGNOSIS — I5020 Unspecified systolic (congestive) heart failure: Secondary | ICD-10-CM | POA: Diagnosis not present

## 2017-10-18 DIAGNOSIS — J45909 Unspecified asthma, uncomplicated: Secondary | ICD-10-CM | POA: Diagnosis not present

## 2017-10-18 DIAGNOSIS — I2510 Atherosclerotic heart disease of native coronary artery without angina pectoris: Secondary | ICD-10-CM | POA: Diagnosis not present

## 2017-10-18 DIAGNOSIS — N183 Chronic kidney disease, stage 3 (moderate): Secondary | ICD-10-CM | POA: Diagnosis not present

## 2017-10-19 DIAGNOSIS — S42302D Unspecified fracture of shaft of humerus, left arm, subsequent encounter for fracture with routine healing: Secondary | ICD-10-CM | POA: Diagnosis not present

## 2017-10-19 DIAGNOSIS — N183 Chronic kidney disease, stage 3 (moderate): Secondary | ICD-10-CM | POA: Diagnosis not present

## 2017-10-19 DIAGNOSIS — I2510 Atherosclerotic heart disease of native coronary artery without angina pectoris: Secondary | ICD-10-CM | POA: Diagnosis not present

## 2017-10-19 DIAGNOSIS — I5020 Unspecified systolic (congestive) heart failure: Secondary | ICD-10-CM | POA: Diagnosis not present

## 2017-10-19 DIAGNOSIS — J45909 Unspecified asthma, uncomplicated: Secondary | ICD-10-CM | POA: Diagnosis not present

## 2017-10-19 DIAGNOSIS — I130 Hypertensive heart and chronic kidney disease with heart failure and stage 1 through stage 4 chronic kidney disease, or unspecified chronic kidney disease: Secondary | ICD-10-CM | POA: Diagnosis not present

## 2017-10-23 DIAGNOSIS — I2510 Atherosclerotic heart disease of native coronary artery without angina pectoris: Secondary | ICD-10-CM | POA: Diagnosis not present

## 2017-10-23 DIAGNOSIS — J45909 Unspecified asthma, uncomplicated: Secondary | ICD-10-CM | POA: Diagnosis not present

## 2017-10-23 DIAGNOSIS — S42302D Unspecified fracture of shaft of humerus, left arm, subsequent encounter for fracture with routine healing: Secondary | ICD-10-CM | POA: Diagnosis not present

## 2017-10-23 DIAGNOSIS — N183 Chronic kidney disease, stage 3 (moderate): Secondary | ICD-10-CM | POA: Diagnosis not present

## 2017-10-23 DIAGNOSIS — I5020 Unspecified systolic (congestive) heart failure: Secondary | ICD-10-CM | POA: Diagnosis not present

## 2017-10-23 DIAGNOSIS — I130 Hypertensive heart and chronic kidney disease with heart failure and stage 1 through stage 4 chronic kidney disease, or unspecified chronic kidney disease: Secondary | ICD-10-CM | POA: Diagnosis not present

## 2017-10-24 DIAGNOSIS — I130 Hypertensive heart and chronic kidney disease with heart failure and stage 1 through stage 4 chronic kidney disease, or unspecified chronic kidney disease: Secondary | ICD-10-CM | POA: Diagnosis not present

## 2017-10-24 DIAGNOSIS — N183 Chronic kidney disease, stage 3 (moderate): Secondary | ICD-10-CM | POA: Diagnosis not present

## 2017-10-24 DIAGNOSIS — J45909 Unspecified asthma, uncomplicated: Secondary | ICD-10-CM | POA: Diagnosis not present

## 2017-10-24 DIAGNOSIS — I5020 Unspecified systolic (congestive) heart failure: Secondary | ICD-10-CM | POA: Diagnosis not present

## 2017-10-24 DIAGNOSIS — S42302D Unspecified fracture of shaft of humerus, left arm, subsequent encounter for fracture with routine healing: Secondary | ICD-10-CM | POA: Diagnosis not present

## 2017-10-24 DIAGNOSIS — I2510 Atherosclerotic heart disease of native coronary artery without angina pectoris: Secondary | ICD-10-CM | POA: Diagnosis not present

## 2017-10-25 DIAGNOSIS — S42302D Unspecified fracture of shaft of humerus, left arm, subsequent encounter for fracture with routine healing: Secondary | ICD-10-CM | POA: Diagnosis not present

## 2017-10-26 DIAGNOSIS — I130 Hypertensive heart and chronic kidney disease with heart failure and stage 1 through stage 4 chronic kidney disease, or unspecified chronic kidney disease: Secondary | ICD-10-CM | POA: Diagnosis not present

## 2017-10-26 DIAGNOSIS — S42302D Unspecified fracture of shaft of humerus, left arm, subsequent encounter for fracture with routine healing: Secondary | ICD-10-CM | POA: Diagnosis not present

## 2017-10-26 DIAGNOSIS — I2510 Atherosclerotic heart disease of native coronary artery without angina pectoris: Secondary | ICD-10-CM | POA: Diagnosis not present

## 2017-10-26 DIAGNOSIS — J45909 Unspecified asthma, uncomplicated: Secondary | ICD-10-CM | POA: Diagnosis not present

## 2017-10-26 DIAGNOSIS — I5020 Unspecified systolic (congestive) heart failure: Secondary | ICD-10-CM | POA: Diagnosis not present

## 2017-10-26 DIAGNOSIS — N183 Chronic kidney disease, stage 3 (moderate): Secondary | ICD-10-CM | POA: Diagnosis not present

## 2017-10-30 DIAGNOSIS — S42302D Unspecified fracture of shaft of humerus, left arm, subsequent encounter for fracture with routine healing: Secondary | ICD-10-CM | POA: Diagnosis not present

## 2017-10-30 DIAGNOSIS — J45909 Unspecified asthma, uncomplicated: Secondary | ICD-10-CM | POA: Diagnosis not present

## 2017-10-30 DIAGNOSIS — I130 Hypertensive heart and chronic kidney disease with heart failure and stage 1 through stage 4 chronic kidney disease, or unspecified chronic kidney disease: Secondary | ICD-10-CM | POA: Diagnosis not present

## 2017-10-30 DIAGNOSIS — I2510 Atherosclerotic heart disease of native coronary artery without angina pectoris: Secondary | ICD-10-CM | POA: Diagnosis not present

## 2017-10-30 DIAGNOSIS — I5020 Unspecified systolic (congestive) heart failure: Secondary | ICD-10-CM | POA: Diagnosis not present

## 2017-10-30 DIAGNOSIS — N183 Chronic kidney disease, stage 3 (moderate): Secondary | ICD-10-CM | POA: Diagnosis not present

## 2017-11-01 DIAGNOSIS — J45909 Unspecified asthma, uncomplicated: Secondary | ICD-10-CM | POA: Diagnosis not present

## 2017-11-01 DIAGNOSIS — I130 Hypertensive heart and chronic kidney disease with heart failure and stage 1 through stage 4 chronic kidney disease, or unspecified chronic kidney disease: Secondary | ICD-10-CM | POA: Diagnosis not present

## 2017-11-01 DIAGNOSIS — N183 Chronic kidney disease, stage 3 (moderate): Secondary | ICD-10-CM | POA: Diagnosis not present

## 2017-11-01 DIAGNOSIS — I2510 Atherosclerotic heart disease of native coronary artery without angina pectoris: Secondary | ICD-10-CM | POA: Diagnosis not present

## 2017-11-01 DIAGNOSIS — S42302D Unspecified fracture of shaft of humerus, left arm, subsequent encounter for fracture with routine healing: Secondary | ICD-10-CM | POA: Diagnosis not present

## 2017-11-01 DIAGNOSIS — I5020 Unspecified systolic (congestive) heart failure: Secondary | ICD-10-CM | POA: Diagnosis not present

## 2017-11-02 ENCOUNTER — Other Ambulatory Visit: Payer: Self-pay | Admitting: Internal Medicine

## 2017-11-02 DIAGNOSIS — N183 Chronic kidney disease, stage 3 (moderate): Secondary | ICD-10-CM | POA: Diagnosis not present

## 2017-11-02 DIAGNOSIS — I130 Hypertensive heart and chronic kidney disease with heart failure and stage 1 through stage 4 chronic kidney disease, or unspecified chronic kidney disease: Secondary | ICD-10-CM | POA: Diagnosis not present

## 2017-11-02 DIAGNOSIS — I5020 Unspecified systolic (congestive) heart failure: Secondary | ICD-10-CM | POA: Diagnosis not present

## 2017-11-02 DIAGNOSIS — S42302D Unspecified fracture of shaft of humerus, left arm, subsequent encounter for fracture with routine healing: Secondary | ICD-10-CM | POA: Diagnosis not present

## 2017-11-02 DIAGNOSIS — J45909 Unspecified asthma, uncomplicated: Secondary | ICD-10-CM | POA: Diagnosis not present

## 2017-11-02 DIAGNOSIS — I2510 Atherosclerotic heart disease of native coronary artery without angina pectoris: Secondary | ICD-10-CM | POA: Diagnosis not present

## 2017-11-20 ENCOUNTER — Encounter: Payer: Self-pay | Admitting: Physical Therapy

## 2017-11-20 ENCOUNTER — Ambulatory Visit: Payer: Medicare Other | Attending: Internal Medicine | Admitting: Physical Therapy

## 2017-11-20 ENCOUNTER — Other Ambulatory Visit: Payer: Self-pay

## 2017-11-20 DIAGNOSIS — M6281 Muscle weakness (generalized): Secondary | ICD-10-CM | POA: Diagnosis not present

## 2017-11-20 DIAGNOSIS — Z23 Encounter for immunization: Secondary | ICD-10-CM | POA: Diagnosis not present

## 2017-11-20 DIAGNOSIS — R2689 Other abnormalities of gait and mobility: Secondary | ICD-10-CM | POA: Insufficient documentation

## 2017-11-20 DIAGNOSIS — R2681 Unsteadiness on feet: Secondary | ICD-10-CM

## 2017-11-21 NOTE — Therapy (Signed)
[facility] [address] [place], [place], [address] Phone: [phone]   Fax:  [phone]  Physical Therapy Evaluation  Patient Details  Name: Holly Bolton MRN: [id number] Date of Birth: Jan 16, 1923 Referring Provider (PT): Dr. Jani Gravel   Encounter Date: 11/20/2017  PT End of Session - 11/21/17 1916    Visit Number  1    Number of Visits  9   8 weeks was recommended but pt requested only 4 wks at this time   Date for PT Re-Evaluation  12/21/17    Authorization Type  Medicare & BCBS supplement     Authorization Time Period  11-20-17 - 02-12-18    PT Start Time  1405    PT Stop Time  1447    PT Time Calculation (min)  42 min       Past Medical History:  Diagnosis Date  . Aneurysm (arteriovenous) of coronary vessels    ascending aorta requiring bypass of the LAD  . Asthma   . Carotid stenosis 04/28/08   Doppler: <40% stenosis bilateral  . Hyperlipemia   . Hypertension   . Pacemaker generator end of life 11/18/08   Intermittent high-grade atrioventricular block  . Peripheral arterial disease (HCC)    left ABI of 0.78  . Presence of permanent cardiac pacemaker 08/26/01   Sinus node dysfunction-St.Jude  . Status post ascending aortic aneurysm repair/AVR -  Medtronic Freestyle root 09/28/2014   ascending aorta requiring bypass of the LAD     Past Surgical History:  Procedure Laterality Date  . ABDOMINAL HYSTERECTOMY  1966  . APPENDECTOMY  1943  . ASCENDING AORTIC ANEURYSM REPAIR  09/22/97   porcine aortic root  . BREAST FIBROADENOMA SURGERY  11/89  . CARDIAC SURGERY    . CORONARY ARTERY BYPASS GRAFT  09/22/97   LIMA to the LAD  . I&D EXTREMITY Left 08/15/2017   Procedure: IRRIGATION AND DEBRIDEMENT EXTREMITY;  Surgeon: Nicholes Stairs, MD;  Location: [facility];  Service: Orthopedics;  Laterality: Left;  . [place]   left  . NEPHRECTOMY  1958   right  . ORIF HUMERUS FRACTURE Left 08/15/2017    Procedure: OPEN REDUCTION INTERNAL FIXATION (ORIF) PROXIMAL HUMERUS FRACTURE;  Surgeon: Nicholes Stairs, MD;  Location: [facility];  Service: Orthopedics;  Laterality: Left;  . OVARIAN CYST SURGERY  1948  . PACEMAKER GENERATOR CHANGE  11/18/08   [employer]Jude  . PACEMAKER INSERTION  08/26/01   St.Jude  . PPM GENERATOR CHANGEOUT N/A 05/16/2017   Procedure: PPM GENERATOR CHANGEOUT;  Surgeon: Sanda Klein, MD;  Location: [facility];  Service: Cardiovascular;  Laterality: N/A;    There were no vitals filed for this visit.   Subjective Assessment - 11/21/17 1857    Subjective  Pt presents to PT eval accompanied by her son, Ed:  pt reports she fell on 08-13-17 and fractured her left humerus, but surgery was not done until 08-15-17; pt is using a SPC for assistance with ambulation - states she has a rollator which she uses at home; had home health until recently    pt had PT at this facility in Sept. 2016   Patient is accompained by:  Family member   son - Ed   Pertinent History  fall sustained on 08-13-17 - pt fractured Lt humerus; ORIF performed on 08-15-17:  CABG 09-22-97:  pacemaker 2003 with generator changeout 05-16-17:  PAD:  s/p ascending aortic aneurysm repair 09-28-14:  HTN  Patient Stated Goals  Be able to get around without fear of falling    Currently in Pain?  No/denies         [facility] PT Assessment - 11/21/17 0001      Assessment   Medical Diagnosis  Gait Instability    Referring Provider (PT)  Dr. Jani Gravel    Onset Date/Surgical Date  08/13/17    Prior Therapy  2016 at this facility      Precautions   Precautions  Fall      Restrictions   Weight Bearing Restrictions  No      Balance Screen   Has the patient fallen in the past 6 months  Yes    How many times?  4    Has the patient had a decrease in activity level because of a fear of falling?   Yes    Is the patient reluctant to leave their home because of a fear of falling?   Yes      [place]  Private residence    [place]   pt's son, Ed, lives with her   Type of [place] to enter    Entrance Stairs-Number of Steps  3    Entrance Stairs-Rails  Can reach both    [place]  One level    [place] - 4 wheels      Prior Function   Level of Independence  Independent with basic ADLs;Independent with household mobility with device      Transfers   Transfers  Sit to Stand    Sit to Stand  4: Min assist;4: Min guard   initially from mat in tx room - improved w/repetition   Sit to Stand Details  Verbal cues for technique;Tactile cues for posture;Verbal cues for precautions/safety    Transfer Cueing  cues to lean forward, scoot to edge of mat     Comments  bil. UE support required; pt needed less assistance after several reps       Ambulation/Gait   Ambulation/Gait  Yes    Ambulation/Gait Assistance  4: Min guard    Ambulation Distance (Feet)  100 Feet    Assistive device  Straight cane   with rubber quad tip   Gait Pattern  Step-through pattern;Decreased step length - right;Decreased step length - left   increased weight shift posteriorly   Ambulation Surface  Level;Indoor    Gait velocity  24.61 secs =1.33 ft/sec with [facility]      Standardized Balance Assessment   Standardized Balance Assessment  Berg Balance Test;Timed Up and Go Test      Berg Balance Test   Sit to Stand  Able to stand using hands after several tries    Standing Unsupported  Able to stand 2 minutes with supervision    Sitting with Back Unsupported but Feet Supported on Floor or Stool  Able to sit safely and securely 2 minutes    Stand to Sit  Controls descent by using hands    Transfers  Able to transfer safely, definite need of hands    Standing Unsupported with Eyes Closed  Able to stand 10 seconds with supervision    Standing Ubsupported with Feet Together  Needs help to attain position but able to stand for 30 seconds with feet  together    From Standing, Reach Forward with Outstretched Arm  Reaches forward but  needs supervision    From Standing Position, Pick up Object from Floor  Unable to pick up shoe, but reaches 2-5 cm (1-2") from shoe and balances independently    From Standing Position, Turn to Look Behind Over each Shoulder  Needs assist to keep from losing balance and falling    Turn 360 Degrees  Needs close supervision or verbal cueing    Standing Unsupported, Alternately Place Feet on Step/Stool  Able to complete >2 steps/needs minimal assist    Standing Unsupported, One Foot in [place] to take small step independently and hold 30 seconds    Standing on One Leg  Unable to try or needs assist to prevent fall    Total Score  26      Timed Up and Go Test   Normal TUG (seconds)  24.94   with SPC wtih rubber quad tip               Objective measurements completed on examination: See above findings.              PT Education - 11/21/17 1914    Education Details  eval results - Merrilee Jansky and TUG score indicative of fall risk; recommend use of rollator (pt currently has this device at home)  for community amb. but she declines at this time    Person(s) Educated  Patient;Child(ren)    Methods  Explanation    Comprehension  Verbalized understanding          PT Long Term Goals - 11/21/17 1931      PT LONG TERM GOAL #1   Title  Improve Berg score from 26/56 to >/= 31/56 to reduce fall risk.    Baseline  26/56 on 11-20-17    Time  4    Period  Weeks    Status  New    Target Date  12/21/17      PT LONG TERM GOAL #2   Title  Improve TUG score from 24.94 secs with SPC to </= 20 secs with SPC to demonstrate improved functional mobility.      Baseline  24.94 secs on 11-20-17    Time  4    Period  Weeks    Status  New    Target Date  12/21/17      PT LONG TERM GOAL #3   Title  Pt will increase gait velocity from 1.33 ft/sec with cane to >/= 1.6 ft/sec with cane for incr. gait  efficiency.    Baseline  24.61 secs = 1.33 ft/sec with SPC on 11-20-17    Time  4    Period  Weeks    Status  New    Target Date  12/21/17      PT LONG TERM GOAL #4   Title  Amb. 350' with rollator on flat, even surface with SBA for increased community accessbility.      Time  4    Period  Weeks    Status  New    Target Date  12/21/17      PT LONG TERM GOAL #5   Title  Negotiate 4 steps with use of bil. hand rails using a step over step sequence.    Time  4    Period  Weeks    Status  New    Target Date  12/21/17      PT LONG TERM GOAL #6   Title  Independent in HEP for balance & strengthening exs.  Time  4    Period  Weeks    Status  New    Target Date  12/21/17             Plan - 11/21/17 1918    Clinical Impression Statement  Pt is a 84 yr old lady with recurrent falls with most recent fall on 08-13-17 in her yard while moving/carrying a bag of leaves; pt sustained an open comminuted Lt humerus fracture.  Pt underwent ORIF Lt humerus on 08-15-17.  Pt received home health PT and has now progressed from rollator to use of SPC with rubber quad tip.  Pt would be safer with use of rollator for community ambulation but declines to use this device at this time - her son, Ed, accompanies her in community and assists with amb. prn.  Pt is at high fall risk per Merrilee Jansky score of 26/56 and TUG score of 24.94 secs with use of cane.  Pt also has difficulty performing sit to stand tranfers.  8 weeks of PT was recommended but pt requested to only schedule 4 weeks at this time.      History and Personal Factors relevant to plan of care:  fall on 08-13-17 in her yard, resulting in Lt humerus fracture; pt underwent ORIF on 08-15-17 to Lt humerus.  Pt's son lives with her and assists prn;  pt received PT at this facility in Sept. 2016; pt declines use of rollator in community at this time    Clinical Presentation  Stable    Clinical Presentation due to:  unsteady gait with balance impairment;       Rehab Potential  Good    PT Frequency  2x / week    PT Duration  4 weeks   pt requesting 4 wks only at this time;  8 weeks were recommended to pt and son   PT Treatment/Interventions  ADLs/Self Care Home Management;DME Instruction;Gait training;Stair training;Therapeutic activities;Therapeutic exercise;Balance training;Neuromuscular re-education;Patient/family education    PT Next Visit Plan  begin balance HEP - at counter for support; sit to stand, heel raises; gait train with rollator/SPC    PT Home Exercise Plan  see above    Recommended Other Services  pt is scheduled for OT eval on 11-27-17    Consulted and Agree with Plan of Care  Patient;Family member/caregiver    Family Member Consulted  son - Ed       Patient will benefit from skilled therapeutic intervention in order to improve the following deficits and impairments:  Abnormal gait, Decreased balance, Decreased strength, Impaired UE functional use, Postural dysfunction  Visit Diagnosis: Other abnormalities of gait and mobility - Plan: PT plan of care cert/re-cert  Unsteadiness on feet - Plan: PT plan of care cert/re-cert  Muscle weakness (generalized) - Plan: PT plan of care cert/re-cert     Problem List Patient Active Problem List   Diagnosis Date Noted  . Elevated troponin   . Asthma 08/15/2017  . Open left humeral fracture 08/15/2017  . Fracture 08/15/2017  . Pacemaker battery depletion 05/17/2017  . Leg cramps 06/12/2016  . Peripheral arterial disease ([place]) 12/08/2014  . Status post ascending aortic aneurysm repair/AVR -  Medtronic Freestyle root 09/28/2014  . CAD s/p CABG (LIMA to LAD) 07/08/2013  . History of aortic root repairand bioprosthetic AVR 07/08/2013  . Hyperlipidemia 07/08/2013  . SSS (sick sinus syndrome) ([place]) 07/08/2013  . Second degree AV block 07/08/2013  . Pacemaker, dual chamber St. Jude 2010 07/08/2013  . Essential hypertension  07/08/2013    Alda Lea, PT 11/21/2017, 7:41  PM  [facility] [address] [place], [place], [address] Phone: [phone]   Fax:  [phone]  Name: Holly Bolton MRN: [id number] Date of Birth: Jul 03, 1922

## 2017-11-27 ENCOUNTER — Ambulatory Visit: Payer: Medicare Other | Admitting: Occupational Therapy

## 2017-11-27 DIAGNOSIS — R2681 Unsteadiness on feet: Secondary | ICD-10-CM

## 2017-11-27 DIAGNOSIS — M6281 Muscle weakness (generalized): Secondary | ICD-10-CM | POA: Diagnosis not present

## 2017-11-27 DIAGNOSIS — R2689 Other abnormalities of gait and mobility: Secondary | ICD-10-CM | POA: Diagnosis not present

## 2017-11-27 NOTE — Therapy (Signed)
[facility] [address] [place], [place], [address] Phone: [phone]   Fax:  [phone]  Occupational Therapy Evaluation  Patient Details  Name: Holly Bolton MRN: [id number] Date of Birth: 09-28-1922 No data recorded  Encounter Date: 11/27/2017  OT End of Session - 11/27/17 1402    Visit Number  1    Number of Visits  9    Date for OT Re-Evaluation  12/28/17    Authorization Type  MCR primary/BCBS secondary    Authorization - Visit Number  1    Authorization - Number of Visits  10    OT Start Time  1315    OT Stop Time  1357    OT Time Calculation (min)  42 min    Activity Tolerance  Patient tolerated treatment well    Behavior During Therapy  [facility] for tasks assessed/performed       Past Medical History:  Diagnosis Date  . Aneurysm (arteriovenous) of coronary vessels    ascending aorta requiring bypass of the LAD  . Asthma   . Carotid stenosis 04/28/08   Doppler: <40% stenosis bilateral  . Hyperlipemia   . Hypertension   . Pacemaker generator end of life 11/18/08   Intermittent high-grade atrioventricular block  . Peripheral arterial disease (HCC)    left ABI of 0.78  . Presence of permanent cardiac pacemaker 08/26/01   Sinus node dysfunction-St.Jude  . Status post ascending aortic aneurysm repair/AVR -  Medtronic Freestyle root 09/28/2014   ascending aorta requiring bypass of the LAD     Past Surgical History:  Procedure Laterality Date  . ABDOMINAL HYSTERECTOMY  1966  . APPENDECTOMY  1943  . ASCENDING AORTIC ANEURYSM REPAIR  09/22/97   porcine aortic root  . BREAST FIBROADENOMA SURGERY  11/89  . CARDIAC SURGERY    . CORONARY ARTERY BYPASS GRAFT  09/22/97   LIMA to the LAD  . I&D EXTREMITY Left 08/15/2017   Procedure: IRRIGATION AND DEBRIDEMENT EXTREMITY;  Surgeon: Nicholes Stairs, MD;  Location: [facility];  Service: Orthopedics;  Laterality: Left;  . [place]   left  .  NEPHRECTOMY  1958   right  . ORIF HUMERUS FRACTURE Left 08/15/2017   Procedure: OPEN REDUCTION INTERNAL FIXATION (ORIF) PROXIMAL HUMERUS FRACTURE;  Surgeon: Nicholes Stairs, MD;  Location: [facility];  Service: Orthopedics;  Laterality: Left;  . OVARIAN CYST SURGERY  1948  . PACEMAKER GENERATOR CHANGE  11/18/08   [employer]Jude  . PACEMAKER INSERTION  08/26/01   [employer].Jude  . PPM GENERATOR CHANGEOUT N/A 05/16/2017   Procedure: PPM GENERATOR CHANGEOUT;  Surgeon: Sanda Klein, MD;  Location: [facility];  Service: Cardiovascular;  Laterality: N/A;    There were no vitals filed for this visit.  Subjective Assessment - 11/27/17 1319    Patient is accompained by:  Family member   SON   Pertinent History  Lt humerus fx from fall on 08/13/17, s/p ORIF 08/15/17. PMH: CABG 99, pacemaker 2003, PAD, HTN, AV repair, CKD stage III    Limitations  fall risk, pacemaker    Patient Stated Goals  Get my Lt arm stronger and get my balance better    Currently in Pain?  No/denies        [facility] OT Assessment - 11/27/17 0001      Assessment   Medical Diagnosis  open fx of Lt humerus, s/p ORIF 08/15/17    Onset Date/Surgical Date  08/13/17  FALL, Surgery 08/15/17   Hand Dominance  Right    Prior Therapy  2016 at this facility      Precautions   Precautions  Fall   see P.T. eval for details)      Restrictions   Weight Bearing Restrictions  No      Balance Screen   Has the patient fallen in the past 6 months  Yes    How many times?  2      Home  Environment   Bathroom Shower/Tub  Tub/Shower unit;Curtain    Shower/tub characteristics  [employer] - single point;Tub bench;Bedside commode;Walker - 2 wheels    Additional Comments  Son lives with patient in 1 story home (w/ basement) but pt lives on main floor. 3 steps to enter from outside    Lives With  Son      Prior Function   Level of Independence  Independent with basic ADLs;Independent with household mobility with device   has  not driven in the last 2 years   Vocation  Retired      ADL   Eating/Feeding  Independent    Grooming  Independent    Upper Body Bathing  Set up    [place] up    Upper Body Dressing  Minimal assistance   if pt does not start w/ LUE   Lower Body Dressing  Independent    Banker -  Hygiene  Independent    Tub/Shower Transfer  Set up      IADL   Shopping  Completely unable to shop    Light Housekeeping  Performs light daily tasks such as dishwashing, bed making   has not returned to [place] to complete simple warm meal prep;Able to complete simple cold meal and snack prep   has not returned to full cooking   Community Mobility  Relies on family or friends for transportation    Medication Management  Is responsible for taking medication in correct dosages at correct time      Mobility   Mobility Status  Needs assist   currently walking with cane     Written Expression   Dominant Hand  Right    Handwriting  --   denies change     Vision - History   Baseline Vision  Wears glasses all the time    Visual History  Glaucoma   has drops for that     Cognition   Cognition Comments  appears WFL's for age      Posture/Postural Control   Posture/Postural Control  Postural limitations    Postural Limitations  Rounded Shoulders;Increased thoracic kyphosis      Sensation   Light Touch  Appears Intact      Coordination   9 Hole Peg Test  Right;Left    Right 9 Hole Peg Test  23.65 sec    Left 9 Hole Peg Test  31.06 sec      Edema   Edema  none      ROM / Strength   AROM / PROM / Strength  AROM;Strength      AROM   Overall AROM Comments  RUE AROM WFL's. LUE AROM approx 90% ROM. LUE sh flex = 118* (RUE = 124*) due to rounded shoulders/posture. IR/ER WFL's      Strength   Overall Strength Comments  LUE Abduction 3+/5.  All other MMT grossly 5/5 when compared to RUE      Hand Function   Right Hand Grip (lbs)   30 lbs    Left Hand Grip (lbs)  30 lbs                           OT Long Term Goals - 11/27/17 1406      OT LONG TERM GOAL #1   Title  Independent with HEP for LUE ROM/strengthening    Time  4    Period  Weeks    Status  New      OT LONG TERM GOAL #2   Title  Pt to fold towels in standing safely mod I level    Time  4    Period  Weeks    Status  New      OT LONG TERM GOAL #3   Title  Pt to verbalize understanding with A/E and task modifications to increase ease and safety w/ IADLS and reduce risk of falls    Time  4    Period  Weeks    Status  New      OT LONG TERM GOAL #4   Title  Pt to perform simple stovetop cooking with supervision safely     Time  4    Period  Weeks    Status  New            Plan - 11/27/17 1402    Clinical Impression Statement  Pt is a 84 y.o. female who presents to outpatient rehab s/p fall on 08/13/17 with Lt humerus fx, s/p ORIF 08/15/17. Pt now with slightly decreased ROM in sh. flexion and weakness LUE, as well as fall risk.     Occupational Profile and client history currently impacting functional performance  PMH; HTN, AV repair, CKD stage 3, PAD, CABG 1999, pacemaker placement 2003    Occupational performance deficits (Please refer to evaluation for details):  IADL's    Rehab Potential  Good    OT Frequency  2x / week    OT Duration  4 weeks   plus eval   OT Treatment/Interventions  Self-care/ADL training;DME and/or AE instruction;Therapeutic activities;Therapeutic exercise;Neuromuscular education;Functional Mobility Training;Patient/family education;Manual Therapy    Plan  HEP (cane for ROM, low range theraband for strengthening)     Clinical Decision Making  Limited treatment options, no task modification necessary       Patient will benefit from skilled therapeutic intervention in order to improve the following deficits and impairments:  Decreased range of motion, Improper body mechanics, Impaired UE functional use,  Decreased endurance, Decreased balance, Decreased mobility, Decreased strength, Decreased activity tolerance, Improper spinal/pelvic alignment  Visit Diagnosis: Muscle weakness (generalized)  Unsteadiness on feet    Problem List Patient Active Problem List   Diagnosis Date Noted  . Elevated troponin   . Asthma 08/15/2017  . Open left humeral fracture 08/15/2017  . Fracture 08/15/2017  . Pacemaker battery depletion 05/17/2017  . Leg cramps 06/12/2016  . Peripheral arterial disease ([place]) 12/08/2014  . Status post ascending aortic aneurysm repair/AVR -  Medtronic Freestyle root 09/28/2014  . CAD s/p CABG (LIMA to LAD) 07/08/2013  . History of aortic root repairand bioprosthetic AVR 07/08/2013  . Hyperlipidemia 07/08/2013  . SSS (sick sinus syndrome) ([place]) 07/08/2013  . Second degree AV block 07/08/2013  . Pacemaker, dual chamber St. Jude 2010 07/08/2013  . Essential hypertension 07/08/2013  Carey Bullocks, OTR/L 11/27/2017, 2:08 PM  [facility] [address] [place], [place], [address] Phone: [phone]   Fax:  [phone]  Name: Holly Bolton MRN: [id number] Date of Birth: [birth year]

## 2017-11-30 DIAGNOSIS — R0609 Other forms of dyspnea: Secondary | ICD-10-CM | POA: Diagnosis not present

## 2017-11-30 DIAGNOSIS — R062 Wheezing: Secondary | ICD-10-CM | POA: Diagnosis not present

## 2017-11-30 DIAGNOSIS — Z8709 Personal history of other diseases of the respiratory system: Secondary | ICD-10-CM | POA: Diagnosis not present

## 2017-12-03 ENCOUNTER — Ambulatory Visit (INDEPENDENT_AMBULATORY_CARE_PROVIDER_SITE_OTHER): Payer: Medicare Other | Admitting: *Deleted

## 2017-12-03 ENCOUNTER — Telehealth: Payer: Self-pay

## 2017-12-03 DIAGNOSIS — I495 Sick sinus syndrome: Secondary | ICD-10-CM

## 2017-12-03 NOTE — Telephone Encounter (Signed)
Spoke with pt and reminded pt of remote transmission that is due today. Pt verbalized understanding.   

## 2017-12-04 ENCOUNTER — Ambulatory Visit: Payer: Medicare Other | Admitting: Physical Therapy

## 2017-12-04 ENCOUNTER — Encounter: Payer: Self-pay | Admitting: Cardiology

## 2017-12-04 DIAGNOSIS — R2689 Other abnormalities of gait and mobility: Secondary | ICD-10-CM | POA: Diagnosis not present

## 2017-12-04 DIAGNOSIS — R2681 Unsteadiness on feet: Secondary | ICD-10-CM

## 2017-12-04 DIAGNOSIS — M6281 Muscle weakness (generalized): Secondary | ICD-10-CM | POA: Diagnosis not present

## 2017-12-04 NOTE — Patient Instructions (Signed)
Standing Marching   Using a chair if necessary, march in place. Repeat 10 times. Do 1 sessions per day.  http://gt2.exer.us/344   Copyright  VHI. All rights reserved.   Hip Backward Kick   Using a chair for balance, keep legs shoulder width apart and toes pointed for- ward. Slowly extend one leg back, keeping knee straight. Do not lean forward. Repeat with other leg. Repeat 10 times. Do 1 sessions per day.  http://gt2.exer.us/340   Copyright  VHI. All rights reserved.     Hip Side Kick   Holding a chair for balance, keep legs shoulder width apart and toes pointed forward. Swing a leg out to side, keeping knee straight. Do not lean. Repeat using other leg. Repeat 10 times. Do 1 sessions per day.  ALSO DO FORWARD KICKS - ALTERNATING LEGS - 10 REPS EACH LEG      Standing On One Leg Without Support .  Stand on one leg in neutral spine without support. Hold 10 seconds. Repeat on other leg. Do 1-2 repetitions, 1 sets.  http://bt.exer.us/36   Copyright  VHI. All rights reserved.    Side-Stepping   Walk to left side with eyes open. Take even steps, leading with same foot. Make sure each foot lifts off the floor. Repeat in opposite direction. Do 2 reps along counter. Do 1 sessions per day.   Copyright  VHI. All rights reserved.     AMBULATION: Walk Backward   Walk backward. Take large steps, do not drag feet. 3 reps per set, 1 sets per day,  5 days per week Use assistive device.

## 2017-12-04 NOTE — Progress Notes (Signed)
Remote pacemaker transmission.   

## 2017-12-05 ENCOUNTER — Encounter: Payer: Self-pay | Admitting: Physical Therapy

## 2017-12-05 NOTE — Therapy (Signed)
[facility] [address] [place], [place], [address] Phone: [phone]   Fax:  [phone]  Physical Therapy Treatment  Patient Details  Name: Holly Bolton MRN: [id number] Date of Birth: [birth year] Referring Provider (PT): Dr. Jani Gravel   Encounter Date: 12/04/2017  PT End of Session - 12/05/17 1209    Visit Number  2    Number of Visits  9    Date for PT Re-Evaluation  12/21/17    Authorization Type  Medicare & BCBS supplement     Authorization Time Period  11-20-17 - 02-12-18    PT Start Time  1451    PT Stop Time  1535    PT Time Calculation (min)  44 min       Past Medical History:  Diagnosis Date  . Aneurysm (arteriovenous) of coronary vessels    ascending aorta requiring bypass of the LAD  . Asthma   . Carotid stenosis 04/28/08   Doppler: <40% stenosis bilateral  . Hyperlipemia   . Hypertension   . Pacemaker generator end of life 11/18/08   Intermittent high-grade atrioventricular block  . Peripheral arterial disease (HCC)    left ABI of 0.78  . Presence of permanent cardiac pacemaker 08/26/01   Sinus node dysfunction-St.Jude  . Status post ascending aortic aneurysm repair/AVR -  Medtronic Freestyle root 09/28/2014   ascending aorta requiring bypass of the LAD     Past Surgical History:  Procedure Laterality Date  . ABDOMINAL HYSTERECTOMY  1966  . APPENDECTOMY  1943  . ASCENDING AORTIC ANEURYSM REPAIR  09/22/97   porcine aortic root  . BREAST FIBROADENOMA SURGERY  11/89  . CARDIAC SURGERY    . CORONARY ARTERY BYPASS GRAFT  09/22/97   LIMA to the LAD  . I&D EXTREMITY Left 08/15/2017   Procedure: IRRIGATION AND DEBRIDEMENT EXTREMITY;  Surgeon: Nicholes Stairs, MD;  Location: [facility];  Service: Orthopedics;  Laterality: Left;  . [place]   left  . NEPHRECTOMY  1958   right  . ORIF HUMERUS FRACTURE Left 08/15/2017   Procedure: OPEN REDUCTION INTERNAL FIXATION (ORIF) PROXIMAL  HUMERUS FRACTURE;  Surgeon: Nicholes Stairs, MD;  Location: [facility];  Service: Orthopedics;  Laterality: Left;  . OVARIAN CYST SURGERY  1948  . PACEMAKER GENERATOR CHANGE  11/18/08   [employer]Jude  . PACEMAKER INSERTION  08/26/01   [employer]Jude  . PPM GENERATOR CHANGEOUT N/A 05/16/2017   Procedure: PPM GENERATOR CHANGEOUT;  Surgeon: Sanda Klein, MD;  Location: [facility];  Service: Cardiovascular;  Laterality: N/A;    There were no vitals filed for this visit.  Subjective Assessment - 12/05/17 0958    Subjective  Pt using SPC for assistance with ambulation; no problems or changes reported since PT initial eval; accompanied by her son, [name]:  he reports pt had the OT eval last week    Patient is accompained by:  Family member   son, [name]   Pertinent History  fall sustained on 08-13-17 - pt fractured Lt humerus; ORIF performed on 08-15-17:  CABG 09-22-97:  pacemaker 2003 with generator changeout 05-16-17:  PAD:  s/p ascending aortic aneurysm repair 09-28-14:  HTN    Patient Stated Goals  Be able to get around without fear of falling    Currently in Pain?  No/denies                       [name] Adult PT Treatment/Exercise -  12/05/17 0001      Transfers   Transfers  Sit to Stand;Stand to Sit    Sit to Stand  4: Min guard    Sit to Stand Details  Verbal cues for technique;Tactile cues for posture;Verbal cues for precautions/safety;Tactile cues for sequencing    Number of Reps  Other reps (comment)   5   Transfer Cueing  cues to bring feet back and lean forward    Comments  1 UE support used from mat      Ambulation/Gait   Ambulation/Gait  Yes    Ambulation/Gait Assistance  4: Min guard    Ambulation Distance (Feet)  115 Feet    Assistive device  Straight cane   with rubber quad tip   Gait Pattern  Step-through pattern;Decreased step length - right;Decreased step length - left   increased weight shift posteriorly   Ambulation Surface  Level;Indoor    Stairs  Yes    Stairs  Assistance  4: Min guard    Stair Management Technique  Forwards;Two rails;Step to pattern;Alternating pattern   alternating with ascension; step to w/descension   Number of Stairs  4    Height of Stairs  6      Posture/Postural Control   Posture/Postural Control  Postural limitations    Postural Limitations  Rounded Shoulders;Increased thoracic kyphosis      NeuroRe-ed:  SLS activity - touching 3 balance bubbles with mod hand held assist - bubbles placed in 1/2 circle - 3 reps each foot  Ladder on floor - pt negotiated ladder using step over step sequence with mod hand held assist - to facilitate increased Step length and SLS  Pt performed standing balance exercises at counter for UE support - for HEP:  Forward, back, and side kicks; marching in place, Sidestepping, SLS 10 sec hold on each leg   Alternate tap ups to 6" step with UE support prn - 10 reps each foot with CGA to min assist for recovery of LOB     PT Education - 12/05/17 1208    Education Details  Pt and son instructed in balance HEP; son reports he currently does some exercises with pt (sidestepping, heel raises);  added kicks, marching and SLS to HEP    Person(s) Educated  Patient;Child(ren)    Methods  Explanation;Demonstration;Handout    Comprehension  Verbalized understanding;Returned demonstration          PT Long Term Goals - 11/21/17 1931      PT LONG TERM GOAL #1   Title  Improve Berg score from 26/56 to >/= 31/56 to reduce fall risk.    Baseline  26/56 on 11-20-17    Time  4    Period  Weeks    Status  New    Target Date  12/21/17      PT LONG TERM GOAL #2   Title  Improve TUG score from 24.94 secs with SPC to </= 20 secs with SPC to demonstrate improved functional mobility.      Baseline  24.94 secs on 11-20-17    Time  4    Period  Weeks    Status  New    Target Date  12/21/17      PT LONG TERM GOAL #3   Title  Pt will increase gait velocity from 1.33 ft/sec with cane to >/= 1.6 ft/sec  with cane for incr. gait efficiency.    Baseline  24.61 secs = 1.33 ft/sec with SPC on 11-20-17  Time  4    Period  Weeks    Status  New    Target Date  12/21/17      PT LONG TERM GOAL #4   Title  Amb. 350' with rollator on flat, even surface with SBA for increased community accessbility.      Time  4    Period  Weeks    Status  New    Target Date  12/21/17      PT LONG TERM GOAL #5   Title  Negotiate 4 steps with use of bil. hand rails using a step over step sequence.    Time  4    Period  Weeks    Status  New    Target Date  12/21/17      PT LONG TERM GOAL #6   Title  Independent in HEP for balance & strengthening exs.    Time  4    Period  Weeks    Status  New    Target Date  12/21/17            Plan - 12/05/17 1210    Clinical Impression Statement  Pt continues to have unsteady gait with use of SPC (with rubber quad tip), but pt declines use of RW at this time.  Pt needs UE support on counter for safety with standing balance exercises.  LLE noted to slightly adduct in stance phase of gait - pt able to decrease adduction with cues to keep Lt foot out to side.      Rehab Potential  Good    PT Frequency  2x / week    PT Duration  4 weeks    PT Treatment/Interventions  ADLs/Self Care Home Management;DME Instruction;Gait training;Stair training;Therapeutic activities;Therapeutic exercise;Balance training;Neuromuscular re-education;Patient/family education    PT Next Visit Plan  check balance exercises given for HEP; cont balance and gait training; functional strengthening exercises    PT Home Exercise Plan  see above; balance HEP given on 12-04-17    Consulted and Agree with Plan of Care  Patient;Family member/caregiver    Family Member Consulted  son - [name]       Patient will benefit from skilled therapeutic intervention in order to improve the following deficits and impairments:  Abnormal gait, Decreased balance, Decreased strength, Impaired UE functional use, Postural  dysfunction  Visit Diagnosis: Other abnormalities of gait and mobility  Unsteadiness on feet  Muscle weakness (generalized)     Problem List Patient Active Problem List   Diagnosis Date Noted  . Elevated troponin   . Asthma 08/15/2017  . Open left humeral fracture 08/15/2017  . Fracture 08/15/2017  . Pacemaker battery depletion 05/17/2017  . Leg cramps 06/12/2016  . Peripheral arterial disease ([place]) 12/08/2014  . Status post ascending aortic aneurysm repair/AVR -  Medtronic Freestyle root 09/28/2014  . CAD s/p CABG (LIMA to LAD) 07/08/2013  . History of aortic root repairand bioprosthetic AVR 07/08/2013  . Hyperlipidemia 07/08/2013  . SSS (sick sinus syndrome) ([place]) 07/08/2013  . Second degree AV block 07/08/2013  . Pacemaker, dual chamber St. Jude 2010 07/08/2013  . Essential hypertension 07/08/2013    Alda Lea, PT 12/05/2017, 12:17 PM  [facility] [address] [place], [place], [address] Phone: [phone]   Fax:  [phone]  Name: ALEA RYER MRN: [id number] Date of Birth: Oct 09, 1922

## 2017-12-06 ENCOUNTER — Ambulatory Visit: Payer: Medicare Other

## 2017-12-06 DIAGNOSIS — S42302D Unspecified fracture of shaft of humerus, left arm, subsequent encounter for fracture with routine healing: Secondary | ICD-10-CM | POA: Diagnosis not present

## 2017-12-07 ENCOUNTER — Ambulatory Visit: Payer: Medicare Other

## 2017-12-11 ENCOUNTER — Ambulatory Visit: Payer: Medicare Other

## 2017-12-13 ENCOUNTER — Ambulatory Visit: Payer: Medicare Other | Admitting: Occupational Therapy

## 2017-12-13 ENCOUNTER — Ambulatory Visit: Payer: Medicare Other

## 2017-12-17 ENCOUNTER — Ambulatory Visit: Payer: Medicare Other | Admitting: Physical Therapy

## 2017-12-17 ENCOUNTER — Encounter: Payer: Medicare Other | Admitting: Occupational Therapy

## 2017-12-20 ENCOUNTER — Ambulatory Visit: Payer: Medicare Other | Admitting: Occupational Therapy

## 2017-12-20 ENCOUNTER — Ambulatory Visit: Payer: Medicare Other | Attending: Internal Medicine

## 2017-12-20 ENCOUNTER — Ambulatory Visit: Payer: Medicare Other

## 2017-12-20 DIAGNOSIS — R2689 Other abnormalities of gait and mobility: Secondary | ICD-10-CM | POA: Diagnosis not present

## 2017-12-20 DIAGNOSIS — M6281 Muscle weakness (generalized): Secondary | ICD-10-CM | POA: Insufficient documentation

## 2017-12-20 DIAGNOSIS — R2681 Unsteadiness on feet: Secondary | ICD-10-CM

## 2017-12-20 NOTE — Therapy (Signed)
[facility] [address] [place], [place], [address] Phone: [phone]   Fax:  [phone]  Physical Therapy Treatment  Patient Details  Name: Holly Bolton MRN: [id number] Date of Birth: 04/16/1922 Referring Provider (PT): Dr. Jani Gravel   Encounter Date: 12/20/2017  PT End of Session - 12/20/17 1617    Visit Number  3    Number of Visits  9    Date for PT Re-Evaluation  12/21/17    Authorization Type  Medicare & BCBS supplement     Authorization Time Period  11-20-17 - 02-12-18    PT Start Time  0962   pt with OT   PT Stop Time  1614    PT Time Calculation (min)  39 min    Equipment Utilized During Treatment  --   min guard to S prn   Activity Tolerance  No increased pain;Patient tolerated treatment well    Behavior During Therapy  [facility] for tasks assessed/performed       Past Medical History:  Diagnosis Date  . Aneurysm (arteriovenous) of coronary vessels    ascending aorta requiring bypass of the LAD  . Asthma   . Carotid stenosis 04/28/08   Doppler: <40% stenosis bilateral  . Hyperlipemia   . Hypertension   . Pacemaker generator end of life 11/18/08   Intermittent high-grade atrioventricular block  . Peripheral arterial disease (HCC)    left ABI of 0.78  . Presence of permanent cardiac pacemaker 08/26/01   Sinus node dysfunction-St.Jude  . Status post ascending aortic aneurysm repair/AVR -  Medtronic Freestyle root 09/28/2014   ascending aorta requiring bypass of the LAD     Past Surgical History:  Procedure Laterality Date  . ABDOMINAL HYSTERECTOMY  1966  . APPENDECTOMY  1943  . ASCENDING AORTIC ANEURYSM REPAIR  09/22/97   porcine aortic root  . BREAST FIBROADENOMA SURGERY  11/89  . CARDIAC SURGERY    . CORONARY ARTERY BYPASS GRAFT  09/22/97   LIMA to the LAD  . I&D EXTREMITY Left 08/15/2017   Procedure: IRRIGATION AND DEBRIDEMENT EXTREMITY;  Surgeon: Nicholes Stairs, MD;  Location: [facility];   Service: Orthopedics;  Laterality: Left;  . [place]   left  . NEPHRECTOMY  1958   right  . ORIF HUMERUS FRACTURE Left 08/15/2017   Procedure: OPEN REDUCTION INTERNAL FIXATION (ORIF) PROXIMAL HUMERUS FRACTURE;  Surgeon: Nicholes Stairs, MD;  Location: [facility];  Service: Orthopedics;  Laterality: Left;  . OVARIAN CYST SURGERY  1948  . PACEMAKER GENERATOR CHANGE  11/18/08   [employer]Jude  . PACEMAKER INSERTION  08/26/01   [employer]Jude  . PPM GENERATOR CHANGEOUT N/A 05/16/2017   Procedure: PPM GENERATOR CHANGEOUT;  Surgeon: Sanda Klein, MD;  Location: [facility];  Service: Cardiovascular;  Laterality: N/A;    There were no vitals filed for this visit.  Subjective Assessment - 12/20/17 1538    Subjective  Pt missed a few weeks of PT 2/2 pt having a cold/cough/wheeze. Pt feels better when using inhaler prescribed by MD, but is still wheezing.     Patient is accompained by:  Family member   Ed: son   Pertinent History  fall sustained on 08-13-17 - pt fractured Lt humerus; ORIF performed on 08-15-17:  CABG 09-22-97:  pacemaker 2003 with generator changeout 05-16-17:  PAD:  s/p ascending aortic aneurysm repair 09-28-14:  HTN    Patient Stated Goals  Be able to get around without  fear of falling    Currently in Pain?  No/denies             Therex and Neuro re-ed: Standing Marching   Using a chair if necessary, march in place. Repeat 10 times. Do 1 sessions per day.  http://gt2.exer.us/344   Copyright  VHI. All rights reserved.   Hip Backward Kick   Using a chair for balance, keep legs shoulder width apart and toes pointed for- ward. Slowly extend one leg back, keeping knee straight. Do not lean forward. Repeat with other leg. Repeat 10 times. Do 1 sessions per day.  http://gt2.exer.us/340   Copyright  VHI. All rights reserved.     Hip Side Kick   Holding a chair for balance, keep legs shoulder width apart and toes pointed forward. Swing a leg out to  side, keeping knee straight. Do not lean. Repeat using other leg. Repeat 10 times. Do 1 sessions per day.  ALSO DO FORWARD KICKS - ALTERNATING LEGS - 10 REPS EACH LEG      Standing On One Leg Without Support .  Stand on one leg in neutral spine WITH ONE HAND support. Hold 10 seconds. Repeat on other leg. Do 2-3 repetitions, 1 sets.  http://bt.exer.us/36   Copyright  VHI. All rights reserved.    Side-Stepping   Walk to left side with eyes open. Take even steps, leading with same foot. Make sure each foot lifts off the floor. Repeat in opposite direction. Do 2 reps along counter. Do 1 sessions per day.   Copyright  VHI. All rights reserved.     AMBULATION: Walk Backward   Walk backward. Take large steps, do not drag feet. 3 reps per set, 1 sets per day,  5 days per week Use assistive device.   Cervical retraction  Perform seated with pillows behind your head (against a wall). Push head straight back into pillows and hold for 5 seconds, repeat 5 times. Once a day.     Pt performed exercises in standing and seated positions with cues and demo for technique. No reports of pain during session but pt did require seated rest breaks (2) due to fatigue.               PT Education - 12/20/17 1617    Education Details  PT reviewed HEP with pt to ensure proper technique and that pt was not wheezing during HEP (per pt's son request). PT added cervical retraction to HEP for [facility] after speaking with OT.     Person(s) Educated  Patient;Child(ren)    Methods  Demonstration;Explanation;Tactile cues;Verbal cues;Handout    Comprehension  Returned demonstration;Verbalized understanding;Need further instruction          PT Long Term Goals - 12/20/17 1620      PT LONG TERM GOAL #1   Title  Improve Berg score from 26/56 to >/= 31/56 to reduce fall risk. NEW TARGET DATE FOR LTGS: 01/07/18, as pt missed two weeks of PT 2/2 illness    Baseline  26/56 on  11-20-17    Time  4    Period  Weeks    Status  New      PT LONG TERM GOAL #2   Title  Improve TUG score from 24.94 secs with SPC to </= 20 secs with SPC to demonstrate improved functional mobility.      Baseline  24.94 secs on 11-20-17    Time  4    Period  Weeks    Status  New      PT LONG TERM GOAL #3   Title  Pt will increase gait velocity from 1.33 ft/sec with cane to >/= 1.6 ft/sec with cane for incr. gait efficiency.    Baseline  24.61 secs = 1.33 ft/sec with SPC on 11-20-17    Time  4    Period  Weeks    Status  New      PT LONG TERM GOAL #4   Title  Amb. 350' with rollator on flat, even surface with SBA for increased community accessbility.      Time  4    Period  Weeks    Status  New      PT LONG TERM GOAL #5   Title  Negotiate 4 steps with use of bil. hand rails using a step over step sequence.    Time  4    Period  Weeks    Status  New      PT LONG TERM GOAL #6   Title  Independent in HEP for balance & strengthening exs.    Time  4    Period  Weeks    Status  New            Plan - 12/20/17 1618    Clinical Impression Statement  Today's skilled session focused on ensuring pt is performing HEP correctly. Pt required tactile and verbal cues for technique, especially during backwards amb., sidestepping, and B hip ext (keeping knee extended). PT added cervical retraction to HEP to improve FHP and strength. LTGs on hold for two more weeks, as pt missed two weeks of PT 2/2 illness (cold). Pt would continue to benefit from skilled PT to improve safety during functional mobility.     Rehab Potential  Good    PT Frequency  2x / week    PT Duration  4 weeks    PT Treatment/Interventions  ADLs/Self Care Home Management;DME Instruction;Gait training;Stair training;Therapeutic activities;Therapeutic exercise;Balance training;Neuromuscular re-education;Patient/family education    PT Next Visit Plan   cont balance and gait training; functional strengthening exercises. Check  LTGs week of 01/07/18    PT Home Exercise Plan  see above; balance HEP given on 12-04-17    Consulted and Agree with Plan of Care  Patient;Family member/caregiver    Family Member Consulted  son - Ed       Patient will benefit from skilled therapeutic intervention in order to improve the following deficits and impairments:  Abnormal gait, Decreased balance, Decreased strength, Impaired UE functional use, Postural dysfunction  Visit Diagnosis: Unsteadiness on feet  Muscle weakness (generalized)  Other abnormalities of gait and mobility     Problem List Patient Active Problem List   Diagnosis Date Noted  . Elevated troponin   . Asthma 08/15/2017  . Open left humeral fracture 08/15/2017  . Fracture 08/15/2017  . Pacemaker battery depletion 05/17/2017  . Leg cramps 06/12/2016  . Peripheral arterial disease ([place]) 12/08/2014  . Status post ascending aortic aneurysm repair/AVR -  Medtronic Freestyle root 09/28/2014  . CAD s/p CABG (LIMA to LAD) 07/08/2013  . History of aortic root repairand bioprosthetic AVR 07/08/2013  . Hyperlipidemia 07/08/2013  . SSS (sick sinus syndrome) ([place]) 07/08/2013  . Second degree AV block 07/08/2013  . Pacemaker, dual chamber St. Jude 2010 07/08/2013  . Essential hypertension 07/08/2013    Anaid Haney L 12/20/2017, 4:21 PM  [facility] [address] [place], [place], [address] Phone: [phone]   Fax:  [place]  Name: Holly Bolton MRN: [id number] Date of Birth: 12/29/1922  Geoffry Paradise, PT,DPT 12/20/17 4:23 PM Phone: [phone] Fax: [phone]

## 2017-12-20 NOTE — Patient Instructions (Signed)
Standing Marching   Using a chair if necessary, march in place. Repeat 10 times. Do 1 sessions per day.  http://gt2.exer.us/344   Copyright  VHI. All rights reserved.   Hip Backward Kick   Using a chair for balance, keep legs shoulder width apart and toes pointed for- ward. Slowly extend one leg back, keeping knee straight. Do not lean forward. Repeat with other leg. Repeat 10 times. Do 1 sessions per day.  http://gt2.exer.us/340   Copyright  VHI. All rights reserved.     Hip Side Kick   Holding a chair for balance, keep legs shoulder width apart and toes pointed forward. Swing a leg out to side, keeping knee straight. Do not lean. Repeat using other leg. Repeat 10 times. Do 1 sessions per day.  ALSO DO FORWARD KICKS - ALTERNATING LEGS - 10 REPS EACH LEG      Standing On One Leg Without Support .  Stand on one leg in neutral spine WITH ONE HAND support. Hold 10 seconds. Repeat on other leg. Do 2-3 repetitions, 1 sets.  http://bt.exer.us/36   Copyright  VHI. All rights reserved.    Side-Stepping   Walk to left side with eyes open. Take even steps, leading with same foot. Make sure each foot lifts off the floor. Repeat in opposite direction. Do 2 reps along counter. Do 1 sessions per day.   Copyright  VHI. All rights reserved.     AMBULATION: Walk Backward   Walk backward. Take large steps, do not drag feet. 3 reps per set, 1 sets per day,  5 days per week Use assistive device.   Cervical retraction  Perform seated with pillows behind your head (against a wall). Push head straight back into pillows and hold for 5 seconds, repeat 5 times. Once a day.

## 2017-12-20 NOTE — Therapy (Signed)
[facility] [address] [place], [place], [address] Phone: [phone]   Fax:  [phone]  Occupational Therapy Treatment  Patient Details  Name: Holly Bolton MRN: [id number] Date of Birth: [birth year] No data recorded  Encounter Date: 12/20/2017  OT End of Session - 12/20/17 1542    Visit Number  2    Number of Visits  9    Date for OT Re-Evaluation  12/28/17    Authorization Type  MCR primary/BCBS secondary    Authorization - Visit Number  2    Authorization - Number of Visits  10    OT Start Time  7672    OT Stop Time  1530    OT Time Calculation (min)  45 min    Activity Tolerance  Patient tolerated treatment well    Behavior During Therapy  [facility] for tasks assessed/performed       Past Medical History:  Diagnosis Date  . Aneurysm (arteriovenous) of coronary vessels    ascending aorta requiring bypass of the LAD  . Asthma   . Carotid stenosis 04/28/08   Doppler: <40% stenosis bilateral  . Hyperlipemia   . Hypertension   . Pacemaker generator end of life 11/18/08   Intermittent high-grade atrioventricular block  . Peripheral arterial disease (HCC)    left ABI of 0.78  . Presence of permanent cardiac pacemaker 08/26/01   Sinus node dysfunction-St.Jude  . Status post ascending aortic aneurysm repair/AVR -  Medtronic Freestyle root 09/28/2014   ascending aorta requiring bypass of the LAD     Past Surgical History:  Procedure Laterality Date  . ABDOMINAL HYSTERECTOMY  1966  . APPENDECTOMY  1943  . ASCENDING AORTIC ANEURYSM REPAIR  09/22/97   porcine aortic root  . BREAST FIBROADENOMA SURGERY  11/89  . CARDIAC SURGERY    . CORONARY ARTERY BYPASS GRAFT  09/22/97   LIMA to the LAD  . I&D EXTREMITY Left 08/15/2017   Procedure: IRRIGATION AND DEBRIDEMENT EXTREMITY;  Surgeon: Nicholes Stairs, MD;  Location: [facility];  Service: Orthopedics;  Laterality: Left;  . [place]   left  . NEPHRECTOMY   1958   right  . ORIF HUMERUS FRACTURE Left 08/15/2017   Procedure: OPEN REDUCTION INTERNAL FIXATION (ORIF) PROXIMAL HUMERUS FRACTURE;  Surgeon: Nicholes Stairs, MD;  Location: [facility];  Service: Orthopedics;  Laterality: Left;  . OVARIAN CYST SURGERY  1948  . PACEMAKER GENERATOR CHANGE  11/18/08   [employer]ude  . PACEMAKER INSERTION  08/26/01   [employer]Jude  . PPM GENERATOR CHANGEOUT N/A 05/16/2017   Procedure: PPM GENERATOR CHANGEOUT;  Surgeon: Sanda Klein, MD;  Location: [facility];  Service: Cardiovascular;  Laterality: N/A;    There were no vitals filed for this visit.  Subjective Assessment - 12/20/17 1451    Subjective   I'm too old to learn new stuff    Patient is accompained by:  Family member   son   Pertinent History  Lt humerus fx from fall on 08/13/17, s/p ORIF 08/15/17. PMH: CABG 99, pacemaker 2003, PAD, HTN, AV repair, CKD stage III    Limitations  fall risk, pacemaker    Patient Stated Goals  Get my Lt arm stronger and get my balance better    Currently in Pain?  No/denies        Pt issued cane HEP - Pt demo 10 reps of each w/ max cueing required. Son present and will help w/ carryover  at home.  Pt performed HEP in supine w/ wedge under upper body and 2 pillows required.   Discussed positioning of neck in bed and trying to use less pillows. Recommended wedge and 1-2 pillows or cervical pillow (vs. 3-4 pillows under head) to prevent malpositioning of head/neck. Son provided w/ handout on cervical pillow                    OT Education - 12/20/17 1541    Education Details  Cane HEP, bed positioning    Person(s) Educated  Patient;Child(ren)    Methods  Explanation;Demonstration;Verbal cues;Handout    Comprehension  Verbalized understanding;Returned demonstration;Verbal cues required;Need further instruction          OT Long Term Goals - 12/20/17 1542      OT LONG TERM GOAL #1   Title  Independent with HEP for LUE ROM/strengthening    Time  4     Period  Weeks    Status  On-going      OT LONG TERM GOAL #2   Title  Pt to fold towels in standing safely mod I level    Time  4    Period  Weeks    Status  New      OT LONG TERM GOAL #3   Title  Pt to verbalize understanding with A/E and task modifications to increase ease and safety w/ IADLS and reduce risk of falls    Time  4    Period  Weeks    Status  New      OT LONG TERM GOAL #4   Title  Pt to perform simple stovetop cooking with supervision safely     Time  4    Period  Weeks    Status  New            Plan - 12/20/17 1542    Clinical Impression Statement  Pt required max cueing to perform cane HEP due to decr. processing of new information. Son present for all education    Occupational Profile and client history currently impacting functional performance  PMH; HTN, AV repair, CKD stage 3, PAD, CABG 1999, pacemaker placement 2003    Occupational performance deficits (Please refer to evaluation for details):  IADL's    Rehab Potential  Good    OT Frequency  2x / week    OT Duration  4 weeks    OT Treatment/Interventions  Self-care/ADL training;DME and/or AE instruction;Therapeutic activities;Therapeutic exercise;Neuromuscular education;Functional Mobility Training;Patient/family education;Manual Therapy    Plan  HEP (cane for ROM, low range theraband for strengthening)     Consulted and Agree with Plan of Care  Patient;Family member/caregiver    Family Member Consulted  son       Patient will benefit from skilled therapeutic intervention in order to improve the following deficits and impairments:  Decreased range of motion, Improper body mechanics, Impaired UE functional use, Decreased endurance, Decreased balance, Decreased mobility, Decreased strength, Decreased activity tolerance, Improper spinal/pelvic alignment  Visit Diagnosis: Unsteadiness on feet  Muscle weakness (generalized)    Problem List Patient Active Problem List   Diagnosis Date Noted  .  Elevated troponin   . Asthma 08/15/2017  . Open left humeral fracture 08/15/2017  . Fracture 08/15/2017  . Pacemaker battery depletion 05/17/2017  . Leg cramps 06/12/2016  . Peripheral arterial disease ([place]) 12/08/2014  . Status post ascending aortic aneurysm repair/AVR -  Medtronic Freestyle root 09/28/2014  . CAD s/p CABG (LIMA to  LAD) 07/08/2013  . History of aortic root repairand bioprosthetic AVR 07/08/2013  . Hyperlipidemia 07/08/2013  . SSS (sick sinus syndrome) ([place]) 07/08/2013  . Second degree AV block 07/08/2013  . Pacemaker, dual chamber St. Jude 2010 07/08/2013  . Essential hypertension 07/08/2013    Carey Bullocks, OTR/L 12/20/2017, 3:44 PM  [facility] [address] [place], [place], [address] Phone: [phone]   Fax:  [phone]  Name: Holly Bolton MRN: [id number] Date of Birth: December 23, 1922

## 2017-12-20 NOTE — Patient Instructions (Signed)
SHOULDER: Flexion - Supine (Cane)    Hold cane in both hands. Raise arms up overhead. Do not allow back to arch. Hold _2__ seconds. _10__ reps per set, _2__ sets per day  ROM: External Rotation - Wand (Supine)    Lie on back holding wand with elbows bent to 90. Rotate forearms over head as far as possible.  Repeat __10__ times per set. Do __2__ sessions per day.  Abduction (Eccentric) - Active (Cane)    LAYING DOWN - Lift cane out to Lt side with affected arm. (LT thumb facing up when holding handle) Avoid hiking shoulder. Keep palm relaxed. Slowly lower affected arm for 3-5 seconds. _10__ reps per set, _2__ sets per day

## 2017-12-25 ENCOUNTER — Encounter: Payer: Self-pay | Admitting: Occupational Therapy

## 2017-12-25 ENCOUNTER — Ambulatory Visit: Payer: Medicare Other | Admitting: Occupational Therapy

## 2017-12-25 ENCOUNTER — Ambulatory Visit: Payer: Medicare Other

## 2017-12-25 DIAGNOSIS — R2681 Unsteadiness on feet: Secondary | ICD-10-CM

## 2017-12-25 DIAGNOSIS — R2689 Other abnormalities of gait and mobility: Secondary | ICD-10-CM

## 2017-12-25 DIAGNOSIS — M6281 Muscle weakness (generalized): Secondary | ICD-10-CM

## 2017-12-25 NOTE — Therapy (Signed)
[facility] [address] [place], [place], [address] Phone: [phone]   Fax:  [phone]  Occupational Therapy Treatment  Patient Details  Name: Holly Bolton MRN: [id number] Date of Birth: 08-02-1922 No data recorded  Encounter Date: 12/25/2017  OT End of Session - 12/25/17 1231    Visit Number  3    Number of Visits  9    Date for OT Re-Evaluation  12/28/17    Authorization Type  MCR primary/BCBS secondary    Authorization - Visit Number  3    Authorization - Number of Visits  10    OT Start Time  1145    OT Stop Time  1224    OT Time Calculation (min)  39 min    Activity Tolerance  Patient limited by fatigue    Behavior During Therapy  --   Tearful with fatigue      Past Medical History:  Diagnosis Date  . Aneurysm (arteriovenous) of coronary vessels    ascending aorta requiring bypass of the LAD  . Asthma   . Carotid stenosis 04/28/08   Doppler: <40% stenosis bilateral  . Hyperlipemia   . Hypertension   . Pacemaker generator end of life 11/18/08   Intermittent high-grade atrioventricular block  . Peripheral arterial disease (HCC)    left ABI of 0.78  . Presence of permanent cardiac pacemaker 08/26/01   Sinus node dysfunction-St.Jude  . Status post ascending aortic aneurysm repair/AVR -  Medtronic Freestyle root 09/28/2014   ascending aorta requiring bypass of the LAD     Past Surgical History:  Procedure Laterality Date  . ABDOMINAL HYSTERECTOMY  1966  . APPENDECTOMY  1943  . ASCENDING AORTIC ANEURYSM REPAIR  09/22/97   porcine aortic root  . BREAST FIBROADENOMA SURGERY  11/89  . CARDIAC SURGERY    . CORONARY ARTERY BYPASS GRAFT  09/22/97   LIMA to the LAD  . I&D EXTREMITY Left 08/15/2017   Procedure: IRRIGATION AND DEBRIDEMENT EXTREMITY;  Surgeon: Nicholes Stairs, MD;  Location: [facility];  Service: Orthopedics;  Laterality: Left;  . [place]   left  . NEPHRECTOMY  1958   right  . ORIF HUMERUS FRACTURE Left 08/15/2017   Procedure: OPEN REDUCTION INTERNAL FIXATION (ORIF) PROXIMAL HUMERUS FRACTURE;  Surgeon: Nicholes Stairs, MD;  Location: [facility];  Service: Orthopedics;  Laterality: Left;  . OVARIAN CYST SURGERY  1948  . PACEMAKER GENERATOR CHANGE  11/18/08   [employer]Jude  . PACEMAKER INSERTION  08/26/01   [employer]Jude  . PPM GENERATOR CHANGEOUT N/A 05/16/2017   Procedure: PPM GENERATOR CHANGEOUT;  Surgeon: Sanda Klein, MD;  Location: [facility];  Service: Cardiovascular;  Laterality: N/A;    There were no vitals filed for this visit.  Subjective Assessment - 12/25/17 1147    Subjective   No pain, but I got a little short of breath on the bicycle (Nustep)    Patient is accompained by:  Family member    Pertinent History  Lt humerus fx from fall on 08/13/17, s/p ORIF 08/15/17. PMH: CABG 99, pacemaker 2003, PAD, HTN, AV repair, CKD stage III    Limitations  fall risk, pacemaker    Patient Stated Goals  Get my Lt arm stronger and get my balance better    Currently in Pain?  No/denies                   OT Treatments/Exercises (OP) - 12/25/17 0001  ADLs   UB Dressing  Patient incorporates LUE into Upper body dressing without cueing.  Son reports she uses automatically with most bilateral tasks      Exercises   Exercises  Shoulder   Reviewed HEP, and maintained first - supine sh flex     Shoulder Exercises: Supine   Other Supine Exercises  Patient fatigued, and tearful with supine shoulder exercises.  Patient with imporving shoulder range of motion - spoke with son to continue first cane exercise for flexion, extension with emphasis on reducing head activation to free up shoulder motion.      Other Supine Exercises  Worked on place and hold, and mid range shoulder exercises in flex/ext, abd/add - with minimal manual resistance.               OT Education - 12/25/17 1230    Education Details  modified cane HEP    Person(s) Educated   Patient;Child(ren)    Methods  Explanation;Demonstration;Verbal cues          OT Long Term Goals - 12/20/17 1542      OT LONG TERM GOAL #1   Title  Independent with HEP for LUE ROM/strengthening    Time  4    Period  Weeks    Status  On-going      OT LONG TERM GOAL #2   Title  Pt to fold towels in standing safely mod I level    Time  4    Period  Weeks    Status  New      OT LONG TERM GOAL #3   Title  Pt to verbalize understanding with A/E and task modifications to increase ease and safety w/ IADLS and reduce risk of falls    Time  4    Period  Weeks    Status  New      OT LONG TERM GOAL #4   Title  Pt to perform simple stovetop cooking with supervision safely     Time  4    Period  Weeks    Status  New            Plan - 12/25/17 1232    Clinical Impression Statement  Patient showing improved shoulder range of motion, cleared to now start light strengthening per son.      Occupational Profile and client history currently impacting functional performance  PMH; HTN, AV repair, CKD stage 3, PAD, CABG 1999, pacemaker placement 2003    Occupational performance deficits (Please refer to evaluation for details):  IADL's    Rehab Potential  Good    OT Frequency  2x / week    OT Duration  4 weeks    OT Treatment/Interventions  Self-care/ADL training;DME and/or AE instruction;Therapeutic activities;Therapeutic exercise;Neuromuscular education;Functional Mobility Training;Patient/family education;Manual Therapy    Plan  light shoulder strengthening - supine or seated    Clinical Decision Making  Limited treatment options, no task modification necessary    Consulted and Agree with Plan of Care  Patient;Family member/caregiver    Family Member Consulted  son       Patient will benefit from skilled therapeutic intervention in order to improve the following deficits and impairments:  Decreased range of motion, Improper body mechanics, Impaired UE functional use, Decreased  endurance, Decreased balance, Decreased mobility, Decreased strength, Decreased activity tolerance, Improper spinal/pelvic alignment  Visit Diagnosis: Unsteadiness on feet  Muscle weakness (generalized)    Problem List Patient Active Problem List   Diagnosis Date  Noted  . Elevated troponin   . Asthma 08/15/2017  . Open left humeral fracture 08/15/2017  . Fracture 08/15/2017  . Pacemaker battery depletion 05/17/2017  . Leg cramps 06/12/2016  . Peripheral arterial disease ([place]) 12/08/2014  . Status post ascending aortic aneurysm repair/AVR -  Medtronic Freestyle root 09/28/2014  . CAD s/p CABG (LIMA to LAD) 07/08/2013  . History of aortic root repairand bioprosthetic AVR 07/08/2013  . Hyperlipidemia 07/08/2013  . SSS (sick sinus syndrome) ([place]) 07/08/2013  . Second degree AV block 07/08/2013  . Pacemaker, dual chamber St. Jude 2010 07/08/2013  . Essential hypertension 07/08/2013    Mariah Milling, OTR/L 12/25/2017, 12:34 PM  [facility] [address] [place], [place], [address] Phone: [phone]   Fax:  [phone]  Name: MAIREN WALLENSTEIN MRN: [id number] Date of Birth: 1922-04-29

## 2017-12-25 NOTE — Therapy (Signed)
[facility] [address] [place], [place], [address] Phone: [phone]   Fax:  [phone]  Physical Therapy Treatment  Patient Details  Name: Holly Bolton MRN: [id number] Date of Birth: 07-Nov-1922 Referring Provider (PT): Dr. Jani Gravel   Encounter Date: 12/25/2017  PT End of Session - 12/25/17 1106    Visit Number  4    Number of Visits  9    Date for PT Re-Evaluation  12/21/17    Authorization Type  Medicare & BCBS supplement     Authorization Time Period  11-20-17 - 02-12-18    PT Start Time  1100    PT Stop Time  1144    PT Time Calculation (min)  44 min    Equipment Utilized During Treatment  Gait belt   min guard to S prn   Activity Tolerance  No increased pain;Patient tolerated treatment well    Behavior During Therapy  [facility] for tasks assessed/performed       Past Medical History:  Diagnosis Date  . Aneurysm (arteriovenous) of coronary vessels    ascending aorta requiring bypass of the LAD  . Asthma   . Carotid stenosis 04/28/08   Doppler: <40% stenosis bilateral  . Hyperlipemia   . Hypertension   . Pacemaker generator end of life 11/18/08   Intermittent high-grade atrioventricular block  . Peripheral arterial disease (HCC)    left ABI of 0.78  . Presence of permanent cardiac pacemaker 08/26/01   Sinus node dysfunction-St.Jude  . Status post ascending aortic aneurysm repair/AVR -  Medtronic Freestyle root 09/28/2014   ascending aorta requiring bypass of the LAD     Past Surgical History:  Procedure Laterality Date  . ABDOMINAL HYSTERECTOMY  1966  . APPENDECTOMY  1943  . ASCENDING AORTIC ANEURYSM REPAIR  09/22/97   porcine aortic root  . BREAST FIBROADENOMA SURGERY  11/89  . CARDIAC SURGERY    . CORONARY ARTERY BYPASS GRAFT  09/22/97   LIMA to the LAD  . I&D EXTREMITY Left 08/15/2017   Procedure: IRRIGATION AND DEBRIDEMENT EXTREMITY;  Surgeon: Nicholes Stairs, MD;  Location: [facility];  Service:  Orthopedics;  Laterality: Left;  . [place]   left  . NEPHRECTOMY  1958   right  . ORIF HUMERUS FRACTURE Left 08/15/2017   Procedure: OPEN REDUCTION INTERNAL FIXATION (ORIF) PROXIMAL HUMERUS FRACTURE;  Surgeon: Nicholes Stairs, MD;  Location: [facility];  Service: Orthopedics;  Laterality: Left;  . OVARIAN CYST SURGERY  1948  . PACEMAKER GENERATOR CHANGE  11/18/08   [employer]Jude  . PACEMAKER INSERTION  08/26/01   St.Jude  . PPM GENERATOR CHANGEOUT N/A 05/16/2017   Procedure: PPM GENERATOR CHANGEOUT;  Surgeon: Sanda Klein, MD;  Location: [facility];  Service: Cardiovascular;  Laterality: N/A;    There were no vitals filed for this visit.  Subjective Assessment - 12/25/17 1105    Subjective  Pt not feeling much better from her cold, still wheezing. Pt reports no recent falls.     Patient is accompained by:  Family member   Son   Pertinent History  fall sustained on 08-13-17 - pt fractured Lt humerus; ORIF performed on 08-15-17:  CABG 09-22-97:  pacemaker 2003 with generator changeout 05-16-17:  PAD:  s/p ascending aortic aneurysm repair 09-28-14:  HTN    Patient Stated Goals  Be able to get around without fear of falling    Currently in Pain?  No/denies  [facility] Adult PT Treatment/Exercise - 12/25/17 1107      Ambulation/Gait   Ambulation/Gait  Yes    Ambulation/Gait Assistance  4: Min guard;5: Supervision    Ambulation/Gait Assistance Details  Mod VC's for step length and forward gaze.     Ambulation Distance (Feet)  115 Feet    Assistive device  Straight cane;Other (Comment)   with rubber quad tip   Gait Pattern  Step-through pattern;Decreased step length - right;Decreased step length - left    Ambulation Surface  Level;Indoor      High Level Balance   High Level Balance Activities  Side stepping;Backward walking;Tandem walking;Marching forwards;Marching backwards;Negotitating around obstacles;Negotiating over obstacles    High Level Balance Comments  on red  mat at parallel bars with SUE support progressing to no UE support required, including side stepping over foam bubbles and toe taps to foam bubbles. VC's for step length and forward gaze throughout each task.       Exercises   Exercises  Knee/Hip      Knee/Hip Exercises: Aerobic   Nustep  L1 BLE/RUE, 3 mins         PT Long Term Goals - 12/20/17 1620      PT LONG TERM GOAL #1   Title  Improve Berg score from 26/56 to >/= 31/56 to reduce fall risk. NEW TARGET DATE FOR LTGS: 01/07/18, as pt missed two weeks of PT 2/2 illness    Baseline  26/56 on 11-20-17    Time  4    Period  Weeks    Status  New      PT LONG TERM GOAL #2   Title  Improve TUG score from 24.94 secs with SPC to </= 20 secs with SPC to demonstrate improved functional mobility.      Baseline  24.94 secs on 11-20-17    Time  4    Period  Weeks    Status  New      PT LONG TERM GOAL #3   Title  Pt will increase gait velocity from 1.33 ft/sec with cane to >/= 1.6 ft/sec with cane for incr. gait efficiency.    Baseline  24.61 secs = 1.33 ft/sec with SPC on 11-20-17    Time  4    Period  Weeks    Status  New      PT LONG TERM GOAL #4   Title  Amb. 350' with rollator on flat, even surface with SBA for increased community accessbility.      Time  4    Period  Weeks    Status  New      PT LONG TERM GOAL #5   Title  Negotiate 4 steps with use of bil. hand rails using a step over step sequence.    Time  4    Period  Weeks    Status  New      PT LONG TERM GOAL #6   Title  Independent in HEP for balance & strengthening exs.    Time  4    Period  Weeks    Status  New            Plan - 12/25/17 1251    Clinical Impression Statement  Todays skilled session focused on gait training with rubber quad tipped cane with min guard/supervision with on LOB requiring Moderate cues for forward gaze and step length, as well as high level balance on a compliant surface. Pt required many rest breaks throughout session.  Pt  should benefit from continued PT sessions to progress towards unmet goals.     Rehab Potential  Good    PT Frequency  2x / week    PT Duration  4 weeks    PT Treatment/Interventions  ADLs/Self Care Home Management;DME Instruction;Gait training;Stair training;Therapeutic activities;Therapeutic exercise;Balance training;Neuromuscular re-education;Patient/family education    PT Next Visit Plan   cont balance and gait training; functional strengthening exercises, endurance. Check LTGs week of 01/07/18    PT Home Exercise Plan  see above; balance HEP given on 12-04-17    Consulted and Agree with Plan of Care  Patient;Family member/caregiver    Family Member Consulted  son - Ed       Patient will benefit from skilled therapeutic intervention in order to improve the following deficits and impairments:  Abnormal gait, Decreased balance, Decreased strength, Impaired UE functional use, Postural dysfunction  Visit Diagnosis: Unsteadiness on feet  Muscle weakness (generalized)  Other abnormalities of gait and mobility     Problem List Patient Active Problem List   Diagnosis Date Noted  . Elevated troponin   . Asthma 08/15/2017  . Open left humeral fracture 08/15/2017  . Fracture 08/15/2017  . Pacemaker battery depletion 05/17/2017  . Leg cramps 06/12/2016  . Peripheral arterial disease ([place]) 12/08/2014  . Status post ascending aortic aneurysm repair/AVR -  Medtronic Freestyle root 09/28/2014  . CAD s/p CABG (LIMA to LAD) 07/08/2013  . History of aortic root repairand bioprosthetic AVR 07/08/2013  . Hyperlipidemia 07/08/2013  . SSS (sick sinus syndrome) ([place]) 07/08/2013  . Second degree AV block 07/08/2013  . Pacemaker, dual chamber St. Jude 2010 07/08/2013  . Essential hypertension 07/08/2013   Holly Bolton, PTA  Holly Bolton 12/25/2017, 12:58 PM  [facility] [address] [place], [place], [address] Phone:  [phone]   Fax:  [phone]  Name: Holly Bolton MRN: [id number] Date of Birth: 09/05/1922

## 2017-12-27 ENCOUNTER — Ambulatory Visit: Payer: Medicare Other

## 2017-12-27 VITALS — BP 160/72 | HR 71

## 2017-12-27 DIAGNOSIS — R2681 Unsteadiness on feet: Secondary | ICD-10-CM

## 2017-12-27 DIAGNOSIS — R2689 Other abnormalities of gait and mobility: Secondary | ICD-10-CM | POA: Diagnosis not present

## 2017-12-27 DIAGNOSIS — M6281 Muscle weakness (generalized): Secondary | ICD-10-CM | POA: Diagnosis not present

## 2017-12-27 NOTE — Therapy (Signed)
[facility] [address] [place], [place], [address] Phone: [phone]   Fax:  [phone]  Physical Therapy Treatment  Patient Details  Name: Holly Bolton MRN: [id number] Date of Birth: [birth year] Referring Provider (PT): Dr. Jani Gravel   Encounter Date: 12/27/2017  PT End of Session - 12/27/17 1155    Visit Number  5    Number of Visits  9    Date for PT Re-Evaluation  12/21/17    Authorization Type  Medicare & BCBS supplement     Authorization Time Period  11-20-17 - 02-12-18    PT Start Time  1107    PT Stop Time  1150    PT Time Calculation (min)  43 min    Equipment Utilized During Treatment  --   min guard to S prn   Activity Tolerance  Patient tolerated treatment well    Behavior During Therapy  [facility] for tasks assessed/performed       Past Medical History:  Diagnosis Date  . Aneurysm (arteriovenous) of coronary vessels    ascending aorta requiring bypass of the LAD  . Asthma   . Carotid stenosis 04/28/08   Doppler: <40% stenosis bilateral  . Hyperlipemia   . Hypertension   . Pacemaker generator end of life 11/18/08   Intermittent high-grade atrioventricular block  . Peripheral arterial disease (HCC)    left ABI of 0.78  . Presence of permanent cardiac pacemaker 08/26/01   Sinus node dysfunction-St.Jude  . Status post ascending aortic aneurysm repair/AVR -  Medtronic Freestyle root 09/28/2014   ascending aorta requiring bypass of the LAD     Past Surgical History:  Procedure Laterality Date  . ABDOMINAL HYSTERECTOMY  1966  . APPENDECTOMY  1943  . ASCENDING AORTIC ANEURYSM REPAIR  09/22/97   porcine aortic root  . BREAST FIBROADENOMA SURGERY  11/89  . CARDIAC SURGERY    . CORONARY ARTERY BYPASS GRAFT  09/22/97   LIMA to the LAD  . I&D EXTREMITY Left 08/15/2017   Procedure: IRRIGATION AND DEBRIDEMENT EXTREMITY;  Surgeon: Nicholes Stairs, MD;  Location: [facility];  Service: Orthopedics;  Laterality:  Left;  . [place]   left  . NEPHRECTOMY  1958   right  . ORIF HUMERUS FRACTURE Left 08/15/2017   Procedure: OPEN REDUCTION INTERNAL FIXATION (ORIF) PROXIMAL HUMERUS FRACTURE;  Surgeon: Nicholes Stairs, MD;  Location: [facility];  Service: Orthopedics;  Laterality: Left;  . OVARIAN CYST SURGERY  1948  . PACEMAKER GENERATOR CHANGE  11/18/08   [employer]Jude  . PACEMAKER INSERTION  08/26/01   [employer]ude  . PPM GENERATOR CHANGEOUT N/A 05/16/2017   Procedure: PPM GENERATOR CHANGEOUT;  Surgeon: Sanda Klein, MD;  Location: [facility];  Service: Cardiovascular;  Laterality: N/A;    Vitals:   12/27/17 1117 12/27/17 1148  BP: (!) 174/79 (!) 160/72  Pulse: 82 71  SpO2: 90% 94%    Subjective Assessment - 12/27/17 1117    Subjective  Pt's son states pt is still wheezing and has been using nebulizer treatment.     Patient is accompained by:  Family member   Ed: son   Pertinent History  fall sustained on 08-13-17 - pt fractured Lt humerus; ORIF performed on 08-15-17:  CABG 09-22-97:  pacemaker 2003 with generator changeout 05-16-17:  PAD:  s/p ascending aortic aneurysm repair 09-28-14:  HTN    Patient Stated Goals  Be able to get around without fear of falling  Currently in Pain?  No/denies                       [facility] Adult PT Treatment/Exercise - 12/27/17 1119      Ambulation/Gait   Ambulation/Gait  Yes    Ambulation/Gait Assistance  4: Min guard;5: Supervision    Ambulation/Gait Assistance Details  Cues for sequencing [facility] with L foot), incr. stride length, improve B heel strike, and upright posture.     Ambulation Distance (Feet)  115 Feet   x3, 75'   Assistive device  Straight cane   with rubber quad tip   Gait Pattern  Step-through pattern;Decreased step length - right;Decreased step length - left    Ambulation Surface  Level;Indoor      Exercises   Exercises  Knee/Hip      Knee/Hip Exercises: Standing   Functional Squat  1 set;10 reps    Functional  Squat Limitations  Holding onto chair with B hands. Cues for proper technique (shift buttocks posteriorly vs. knees over toes).           Balance Exercises - 12/27/17 1142      Balance Exercises: Standing   Standing Eyes Opened  Narrow base of support (BOS);Wide (BOA);Head turns;Solid surface;5 reps;30 secs    Standing Eyes Closed  Wide (BOA);Solid surface;2 reps;10 secs    SLS  Eyes open;Intermittent upper extremity support;2 reps;10 secs    Other Standing Exercises  Performed with intermittent UE support and min guard for safety. Cues for technique.         PT Education - 12/27/17 1154    Education Details  PT educated pt on pursed lip breathing, as SaO2 on room air incr. to 95-99% with pursed lip breathing vs. 90% at rest. PT also educated pt on elevated systolic BP and to monitor, and inform MD if it remains elevated. PT provided education on sequencing with SPC (with rubber quad tip), and asked pt's son to bring pt's rollator next session.     Person(s) Educated  Patient;Child(ren)    Methods  Explanation;Demonstration;Verbal cues;Handout    Comprehension  Returned demonstration;Verbalized understanding          PT Long Term Goals - 12/20/17 1620      PT LONG TERM GOAL #1   Title  Improve Berg score from 26/56 to >/= 31/56 to reduce fall risk. NEW TARGET DATE FOR LTGS: 01/07/18, as pt missed two weeks of PT 2/2 illness    Baseline  26/56 on 11-20-17    Time  4    Period  Weeks    Status  New      PT LONG TERM GOAL #2   Title  Improve TUG score from 24.94 secs with SPC to </= 20 secs with SPC to demonstrate improved functional mobility.      Baseline  24.94 secs on 11-20-17    Time  4    Period  Weeks    Status  New      PT LONG TERM GOAL #3   Title  Pt will increase gait velocity from 1.33 ft/sec with cane to >/= 1.6 ft/sec with cane for incr. gait efficiency.    Baseline  24.61 secs = 1.33 ft/sec with SPC on 11-20-17    Time  4    Period  Weeks    Status  New       PT LONG TERM GOAL #4   Title  Amb. 350' with rollator on flat, even  surface with SBA for increased community accessbility.      Time  4    Period  Weeks    Status  New      PT LONG TERM GOAL #5   Title  Negotiate 4 steps with use of bil. hand rails using a step over step sequence.    Time  4    Period  Weeks    Status  New      PT LONG TERM GOAL #6   Title  Independent in HEP for balance & strengthening exs.    Time  4    Period  Weeks    Status  New            Plan - 12/27/17 1156    Clinical Impression Statement  Today's skilled session focused on circuit training to improve endurance, gait deviations, strength, and balance. Pt required frequent seated rest breaks 2/2 fatigue, weakness, and SOB. Pt's SaO2 on room air incr. from 90% at rest to 95-99% with pursed lip breathing after activity. Pt had difficulty sequencing with SPC (with rubber quad tip) and might benefit from gait with rollator to improve safety and allow for seated rest breaks. Pt would continue to benefit from skilled PT to improve safety during functional mobility.     Rehab Potential  Good    PT Frequency  2x / week    PT Duration  4 weeks    PT Treatment/Interventions  ADLs/Self Care Home Management;DME Instruction;Gait training;Stair training;Therapeutic activities;Therapeutic exercise;Balance training;Neuromuscular re-education;Patient/family education    PT Next Visit Plan  Circuit training. cont balance and gait training; functional strengthening exercises, endurance. Check LTGs week of 01/07/18    PT Home Exercise Plan  see above; balance HEP given on 12-04-17    Consulted and Agree with Plan of Care  Patient;Family member/caregiver    Family Member Consulted  son - Ed       Patient will benefit from skilled therapeutic intervention in order to improve the following deficits and impairments:  Abnormal gait, Decreased balance, Decreased strength, Impaired UE functional use, Postural  dysfunction  Visit Diagnosis: Other abnormalities of gait and mobility  Muscle weakness (generalized)  Unsteadiness on feet     Problem List Patient Active Problem List   Diagnosis Date Noted  . Elevated troponin   . Asthma 08/15/2017  . Open left humeral fracture 08/15/2017  . Fracture 08/15/2017  . Pacemaker battery depletion 05/17/2017  . Leg cramps 06/12/2016  . Peripheral arterial disease ([place]) 12/08/2014  . Status post ascending aortic aneurysm repair/AVR -  Medtronic Freestyle root 09/28/2014  . CAD s/p CABG (LIMA to LAD) 07/08/2013  . History of aortic root repairand bioprosthetic AVR 07/08/2013  . Hyperlipidemia 07/08/2013  . SSS (sick sinus syndrome) ([place]) 07/08/2013  . Second degree AV block 07/08/2013  . Pacemaker, dual chamber St. Jude 2010 07/08/2013  . Essential hypertension 07/08/2013    Miller,Jennifer L 12/27/2017, 12:00 PM  [facility] [address] [place], [place], [address] Phone: [phone]   Fax:  [phone]  Name: KHALEAH DUER MRN: [id number] Date of Birth: 10/08/1922  Geoffry Paradise, PT,DPT 12/27/17 12:00 PM Phone: [phone] Fax: [phone]

## 2018-01-01 ENCOUNTER — Ambulatory Visit: Payer: Medicare Other | Admitting: Physical Therapy

## 2018-01-01 ENCOUNTER — Ambulatory Visit: Payer: Medicare Other | Admitting: Occupational Therapy

## 2018-01-01 DIAGNOSIS — R2689 Other abnormalities of gait and mobility: Secondary | ICD-10-CM

## 2018-01-01 DIAGNOSIS — R2681 Unsteadiness on feet: Secondary | ICD-10-CM | POA: Diagnosis not present

## 2018-01-01 DIAGNOSIS — M6281 Muscle weakness (generalized): Secondary | ICD-10-CM

## 2018-01-01 NOTE — Therapy (Signed)
[facility] [address] [place], [place], [address] Phone: [phone]   Fax:  [phone]  Occupational Therapy Treatment  Patient Details  Name: Holly Bolton MRN: [id number] Date of Birth: [birth year] No data recorded  Encounter Date: 01/01/2018  OT End of Session - 01/01/18 1255    Visit Number  4    Number of Visits  9    Date for OT Re-Evaluation  12/28/17    Authorization Type  MCR primary/BCBS secondary    Authorization - Visit Number  4    Authorization - Number of Visits  10    OT Start Time  1145    OT Stop Time  1230    OT Time Calculation (min)  45 min    Activity Tolerance  Patient limited by fatigue    Behavior During Therapy  [facility] for tasks assessed/performed       Past Medical History:  Diagnosis Date  . Aneurysm (arteriovenous) of coronary vessels    ascending aorta requiring bypass of the LAD  . Asthma   . Carotid stenosis 04/28/08   Doppler: <40% stenosis bilateral  . Hyperlipemia   . Hypertension   . Pacemaker generator end of life 11/18/08   Intermittent high-grade atrioventricular block  . Peripheral arterial disease (HCC)    left ABI of 0.78  . Presence of permanent cardiac pacemaker 08/26/01   Sinus node dysfunction-St.Jude  . Status post ascending aortic aneurysm repair/AVR -  Medtronic Freestyle root 09/28/2014   ascending aorta requiring bypass of the LAD     Past Surgical History:  Procedure Laterality Date  . ABDOMINAL HYSTERECTOMY  1966  . APPENDECTOMY  1943  . ASCENDING AORTIC ANEURYSM REPAIR  09/22/97   porcine aortic root  . BREAST FIBROADENOMA SURGERY  11/89  . CARDIAC SURGERY    . CORONARY ARTERY BYPASS GRAFT  09/22/97   LIMA to the LAD  . I&D EXTREMITY Left 08/15/2017   Procedure: IRRIGATION AND DEBRIDEMENT EXTREMITY;  Surgeon: Nicholes Stairs, MD;  Location: [facility];  Service: Orthopedics;  Laterality: Left;  . [place]   left  . NEPHRECTOMY   1958   right  . ORIF HUMERUS FRACTURE Left 08/15/2017   Procedure: OPEN REDUCTION INTERNAL FIXATION (ORIF) PROXIMAL HUMERUS FRACTURE;  Surgeon: Nicholes Stairs, MD;  Location: [facility];  Service: Orthopedics;  Laterality: Left;  . OVARIAN CYST SURGERY  1948  . PACEMAKER GENERATOR CHANGE  11/18/08   [employer]Jude  . PACEMAKER INSERTION  08/26/01   [employer]Jude  . PPM GENERATOR CHANGEOUT N/A 05/16/2017   Procedure: PPM GENERATOR CHANGEOUT;  Surgeon: Sanda Klein, MD;  Location: [facility];  Service: Cardiovascular;  Laterality: N/A;    There were no vitals filed for this visit.  Subjective Assessment - 01/01/18 1220    Subjective   Pt only had pain with UBE which was discontinued    Pertinent History  Lt humerus fx from fall on 08/13/17, s/p ORIF 08/15/17. PMH: CABG 99, pacemaker 2003, PAD, HTN, AV repair, CKD stage III    Limitations  fall risk, pacemaker    Patient Stated Goals  Get my Lt arm stronger and get my balance better    Currently in Pain?  No/denies       Pt issued theraband HEP - pt demo each with cueing. Son present for education and able to assist patient with ex's at home.  D/C UBE due to fatigue and reports of mild  chest pain.                     OT Education - 01/01/18 1227    Education Details  Theraband HEP     Person(s) Educated  Patient;Child(ren)    Methods  Explanation;Demonstration;Verbal cues;Handout    Comprehension  Verbalized understanding;Returned demonstration;Verbal cues required   Son verbalizes understanding         OT Long Term Goals - 12/20/17 1542      OT LONG TERM GOAL #1   Title  Independent with HEP for LUE ROM/strengthening    Time  4    Period  Weeks    Status  On-going      OT LONG TERM GOAL #2   Title  Pt to fold towels in standing safely mod I level    Time  4    Period  Weeks    Status  New      OT LONG TERM GOAL #3   Title  Pt to verbalize understanding with A/E and task modifications to increase ease and  safety w/ IADLS and reduce risk of falls    Time  4    Period  Weeks    Status  New      OT LONG TERM GOAL #4   Title  Pt to perform simple stovetop cooking with supervision safely     Time  4    Period  Weeks    Status  New            Plan - 01/01/18 1255    Clinical Impression Statement  Pt progressing with Lt shoulder motion and strength.     Occupational Profile and client history currently impacting functional performance  PMH; HTN, AV repair, CKD stage 3, PAD, CABG 1999, pacemaker placement 2003    Occupational performance deficits (Please refer to evaluation for details):  IADL's    Rehab Potential  Good    OT Frequency  2x / week    OT Duration  4 weeks    OT Treatment/Interventions  Self-care/ADL training;DME and/or AE instruction;Therapeutic activities;Therapeutic exercise;Neuromuscular education;Functional Mobility Training;Patient/family education;Manual Therapy    Plan  review theraband HEP, try standing to fold towels    Consulted and Agree with Plan of Care  Patient;Family member/caregiver    Family Member Consulted  son       Patient will benefit from skilled therapeutic intervention in order to improve the following deficits and impairments:  Decreased range of motion, Improper body mechanics, Impaired UE functional use, Decreased endurance, Decreased balance, Decreased mobility, Decreased strength, Decreased activity tolerance, Improper spinal/pelvic alignment  Visit Diagnosis: Muscle weakness (generalized)    Problem List Patient Active Problem List   Diagnosis Date Noted  . Elevated troponin   . Asthma 08/15/2017  . Open left humeral fracture 08/15/2017  . Fracture 08/15/2017  . Pacemaker battery depletion 05/17/2017  . Leg cramps 06/12/2016  . Peripheral arterial disease ([place]) 12/08/2014  . Status post ascending aortic aneurysm repair/AVR -  Medtronic Freestyle root 09/28/2014  . CAD s/p CABG (LIMA to LAD) 07/08/2013  . History of aortic root  repairand bioprosthetic AVR 07/08/2013  . Hyperlipidemia 07/08/2013  . SSS (sick sinus syndrome) ([place]) 07/08/2013  . Second degree AV block 07/08/2013  . Pacemaker, dual chamber St. Jude 2010 07/08/2013  . Essential hypertension 07/08/2013    Carey Bullocks, OTR/L 01/01/2018, 12:57 PM  [facility] [address] [place], [place],  [address] Phone: [phone]   Fax:  [phone]  Name: LAI HENDRIKS MRN: [id number] Date of Birth: 1922/11/10

## 2018-01-01 NOTE — Therapy (Signed)
[facility] [address] [place], [place], [address] Phone: [phone]   Fax:  [phone]  Physical Therapy Treatment  Patient Details  Name: Holly Bolton MRN: [id number] Date of Birth: Feb 10, 1923 Referring Provider (PT): Dr. Jani Gravel   Encounter Date: 01/01/2018  PT End of Session - 01/01/18 2115    Visit Number  6    Number of Visits  9    Date for PT Re-Evaluation  01/07/18    Authorization Type  Medicare & BCBS supplement     Authorization Time Period  11-20-17 - 02-12-18    PT Start Time  1102    PT Stop Time  1146    PT Time Calculation (min)  44 min       Past Medical History:  Diagnosis Date  . Aneurysm (arteriovenous) of coronary vessels    ascending aorta requiring bypass of the LAD  . Asthma   . Carotid stenosis 04/28/08   Doppler: <40% stenosis bilateral  . Hyperlipemia   . Hypertension   . Pacemaker generator end of life 11/18/08   Intermittent high-grade atrioventricular block  . Peripheral arterial disease (HCC)    left ABI of 0.78  . Presence of permanent cardiac pacemaker 08/26/01   Sinus node dysfunction-St.Jude  . Status post ascending aortic aneurysm repair/AVR -  Medtronic Freestyle root 09/28/2014   ascending aorta requiring bypass of the LAD     Past Surgical History:  Procedure Laterality Date  . ABDOMINAL HYSTERECTOMY  1966  . APPENDECTOMY  1943  . ASCENDING AORTIC ANEURYSM REPAIR  09/22/97   porcine aortic root  . BREAST FIBROADENOMA SURGERY  11/89  . CARDIAC SURGERY    . CORONARY ARTERY BYPASS GRAFT  09/22/97   LIMA to the LAD  . I&D EXTREMITY Left 08/15/2017   Procedure: IRRIGATION AND DEBRIDEMENT EXTREMITY;  Surgeon: Nicholes Stairs, MD;  Location: [facility];  Service: Orthopedics;  Laterality: Left;  . [place]   left  . NEPHRECTOMY  1958   right  . ORIF HUMERUS FRACTURE Left 08/15/2017   Procedure: OPEN REDUCTION INTERNAL FIXATION (ORIF) PROXIMAL  HUMERUS FRACTURE;  Surgeon: Nicholes Stairs, MD;  Location: [facility];  Service: Orthopedics;  Laterality: Left;  . OVARIAN CYST SURGERY  1948  . PACEMAKER GENERATOR CHANGE  11/18/08   [employer]Jude  . PACEMAKER INSERTION  08/26/01   St.Jude  . PPM GENERATOR CHANGEOUT N/A 05/16/2017   Procedure: PPM GENERATOR CHANGEOUT;  Surgeon: Sanda Klein, MD;  Location: [facility];  Service: Cardiovascular;  Laterality: N/A;    There were no vitals filed for this visit.  Subjective Assessment - 01/01/18 2101    Subjective  Pt states she is tired today and doesn't feel that well; son reports he gave her nebulizer treatment this morning before therapies    Patient is accompained by:  Family member    Pertinent History  fall sustained on 08-13-17 - pt fractured Lt humerus; ORIF performed on 08-15-17:  CABG 09-22-97:  pacemaker 2003 with generator changeout 05-16-17:  PAD:  s/p ascending aortic aneurysm repair 09-28-14:  HTN    Patient Stated Goals  Be able to get around without fear of falling    Currently in Pain?  No/denies                       [facility] Adult PT Treatment/Exercise - 01/01/18 1108      Transfers   Transfers  Sit to Stand    Sit to Stand  4: Min guard    Number of Reps  Other reps (comment)   5   Transfer Cueing  cues to bring feet back and to lean forward to avoid posterior LOB       Ambulation/Gait   Ambulation/Gait  Yes    Ambulation/Gait Assistance  4: Min guard    Ambulation/Gait Assistance Details  cues for sequence and step length    Ambulation Distance (Feet)  230 Feet   115' on 2nd rep gait training   Assistive device  Straight cane   with rubber quad tip   Gait Pattern  Step-through pattern;Decreased step length - right;Decreased step length - left   increased weight shift posteriorly   Ambulation Surface  Level;Indoor    Stairs  Yes    Stairs Assistance  5: Supervision    Stair Management Technique  Forwards;Two rails;Step to pattern;Alternating pattern    alternating with ascension; step to w/descension   Number of Stairs  4    Height of Stairs  6      Exercises   Exercises  Knee/Hip      Knee/Hip Exercises: Standing   Heel Raises  Both;1 set;10 reps    Forward Step Up  Both;1 set;5 reps;Hand Hold: 2;Step Height: 6"          Balance Exercises - 01/01/18 2110      Balance Exercises: Standing   SLS  Eyes open;Solid surface;2 reps;10 secs    Step Over Hurdles / Cones  5 reps each leg with mod hand held assist    Other Standing Exercises  Pt performed ladder activity with mod hand held assist       Cone taps with each foot - placed on red mat - min to mod hand held assist Tap ups to 1st and then 2nd step - 5 reps each foot with UE support on hand rail       PT Long Term Goals - 12/20/17 1620      PT LONG TERM GOAL #1   Title  Improve Berg score from 26/56 to >/= 31/56 to reduce fall risk. NEW TARGET DATE FOR LTGS: 01/07/18, as pt missed two weeks of PT 2/2 illness    Baseline  26/56 on 11-20-17    Time  4    Period  Weeks    Status  New      PT LONG TERM GOAL #2   Title  Improve TUG score from 24.94 secs with SPC to </= 20 secs with SPC to demonstrate improved functional mobility.      Baseline  24.94 secs on 11-20-17    Time  4    Period  Weeks    Status  New      PT LONG TERM GOAL #3   Title  Pt will increase gait velocity from 1.33 ft/sec with cane to >/= 1.6 ft/sec with cane for incr. gait efficiency.    Baseline  24.61 secs = 1.33 ft/sec with SPC on 11-20-17    Time  4    Period  Weeks    Status  New      PT LONG TERM GOAL #4   Title  Amb. 350' with rollator on flat, even surface with SBA for increased community accessbility.      Time  4    Period  Weeks    Status  New      PT LONG TERM GOAL #5  Title  Negotiate 4 steps with use of bil. hand rails using a step over step sequence.    Time  4    Period  Weeks    Status  New      PT LONG TERM GOAL #6   Title  Independent in HEP for balance &  strengthening exs.    Time  4    Period  Weeks    Status  New            Plan - 01/01/18 2117    Clinical Impression Statement  Pt had one near trip/LOB due to hitting base of cane with her right foot during gait training; pt able to independently recover mild LOB.  Pt fatigued quickly today with some dyspnea noted - pt required seated rest breaks (son stated he had given pt breathing treatment in am prior to PT appt.).  Pt had more difficulty with Lt SLS than with Rt SLS. Pt did well with cone taps with min hand held assist needed to maintain balance.      Rehab Potential  Good    PT Frequency  2x / week    PT Duration  4 weeks    PT Treatment/Interventions  ADLs/Self Care Home Management;DME Instruction;Gait training;Stair training;Therapeutic activities;Therapeutic exercise;Balance training;Neuromuscular re-education;Patient/family education;Cryotherapy    PT Next Visit Plan  Circuit training. cont balance and gait training; functional strengthening exercises, endurance. Check LTGs week of 01/07/18 - do renewal for 2x/week for 3 additional weeks     PT Home Exercise Plan  see above; balance HEP given on 12-04-17    Consulted and Agree with Plan of Care  Patient;Family member/caregiver    Family Member Consulted  son - Ed       Patient will benefit from skilled therapeutic intervention in order to improve the following deficits and impairments:  Abnormal gait, Decreased balance, Decreased strength, Impaired UE functional use, Postural dysfunction  Visit Diagnosis: Other abnormalities of gait and mobility  Muscle weakness (generalized)  Unsteadiness on feet     Problem List Patient Active Problem List   Diagnosis Date Noted  . Elevated troponin   . Asthma 08/15/2017  . Open left humeral fracture 08/15/2017  . Fracture 08/15/2017  . Pacemaker battery depletion 05/17/2017  . Leg cramps 06/12/2016  . Peripheral arterial disease ([place]) 12/08/2014  . Status post ascending  aortic aneurysm repair/AVR -  Medtronic Freestyle root 09/28/2014  . CAD s/p CABG (LIMA to LAD) 07/08/2013  . History of aortic root repairand bioprosthetic AVR 07/08/2013  . Hyperlipidemia 07/08/2013  . SSS (sick sinus syndrome) ([place]) 07/08/2013  . Second degree AV block 07/08/2013  . Pacemaker, dual chamber St. Jude 2010 07/08/2013  . Essential hypertension 07/08/2013    Alda Lea, PT 01/01/2018, 9:26 PM  [facility] [address] [place], [place], [address] Phone: [phone]   Fax:  [phone]  Name: KIMYATTA LECY MRN: [id number] Date of Birth: 05/24/1922

## 2018-01-01 NOTE — Patient Instructions (Signed)
    Strengthening: Resisted Flexion   SEATED: Place band under feet, Place tubing around Lt wrist/forearm. Pull arm forward and up to eye level. Move shoulder through pain-free range of motion. Keep elbow STRAIGHT Repeat __10__ times per set.  Do _1-2_ sessions per day , every other day   Shoulder Retraction With Band    With band attached in front, pull arms back as if rowing a boat.  Repeat __10__ times. Do __1-2__ sessions per day, every other day.     Resisted Horizontal Abduction: Bilateral   SEATED: hold tubing in both hands, arms out in front. Keeping arms straight, pinch shoulder blades together and stretch arms out. Repeat _10___ times per set. Do _1-2___ sessions per day, every other day.   Elbow Flexion: Resisted   With tubing held in ___left___ hand(s) and other end secured under foot, curl arm up as far as possible. Repeat _10___ times per set. Do _1-2___ sessions per day, every other day.    Elbow Extension: Resisted   Sit in chair with resistive band secured at at Rt shoulder, hold other end with Lt hand with elbow bent. Straighten left elbow, keeping still w/ Rt hand at Rt shoulder. Repeat _10___ times per set.  Do _1-2___ sessions per day, every other day.

## 2018-01-03 ENCOUNTER — Ambulatory Visit: Payer: Medicare Other | Admitting: Occupational Therapy

## 2018-01-03 DIAGNOSIS — R2681 Unsteadiness on feet: Secondary | ICD-10-CM | POA: Diagnosis not present

## 2018-01-03 DIAGNOSIS — R2689 Other abnormalities of gait and mobility: Secondary | ICD-10-CM | POA: Diagnosis not present

## 2018-01-03 DIAGNOSIS — M6281 Muscle weakness (generalized): Secondary | ICD-10-CM | POA: Diagnosis not present

## 2018-01-03 NOTE — Therapy (Signed)
[facility] [address] [place], [place], [address] Phone: [phone]   Fax:  [phone]  Occupational Therapy Treatment  Patient Details  Name: Holly Bolton MRN: [id number] Date of Birth: 24-Apr-1922 No data recorded  Encounter Date: 01/03/2018  OT End of Session - 01/03/18 1226    Visit Number  5    Number of Visits  9    Date for OT Re-Evaluation  12/28/17    Authorization Type  MCR primary/BCBS secondary    Authorization - Visit Number  5    Authorization - Number of Visits  10    OT Start Time  1100    OT Stop Time  1145    OT Time Calculation (min)  45 min    Activity Tolerance  Patient limited by fatigue    Behavior During Therapy  [facility] for tasks assessed/performed       Past Medical History:  Diagnosis Date  . Aneurysm (arteriovenous) of coronary vessels    ascending aorta requiring bypass of the LAD  . Asthma   . Carotid stenosis 04/28/08   Doppler: <40% stenosis bilateral  . Hyperlipemia   . Hypertension   . Pacemaker generator end of life 11/18/08   Intermittent high-grade atrioventricular block  . Peripheral arterial disease (HCC)    left ABI of 0.78  . Presence of permanent cardiac pacemaker 08/26/01   Sinus node dysfunction-St.Jude  . Status post ascending aortic aneurysm repair/AVR -  Medtronic Freestyle root 09/28/2014   ascending aorta requiring bypass of the LAD     Past Surgical History:  Procedure Laterality Date  . ABDOMINAL HYSTERECTOMY  1966  . APPENDECTOMY  1943  . ASCENDING AORTIC ANEURYSM REPAIR  09/22/97   porcine aortic root  . BREAST FIBROADENOMA SURGERY  11/89  . CARDIAC SURGERY    . CORONARY ARTERY BYPASS GRAFT  09/22/97   LIMA to the LAD  . I&D EXTREMITY Left 08/15/2017   Procedure: IRRIGATION AND DEBRIDEMENT EXTREMITY;  Surgeon: Nicholes Stairs, MD;  Location: [facility];  Service: Orthopedics;  Laterality: Left;  . [place]   left  . NEPHRECTOMY   1958   right  . ORIF HUMERUS FRACTURE Left 08/15/2017   Procedure: OPEN REDUCTION INTERNAL FIXATION (ORIF) PROXIMAL HUMERUS FRACTURE;  Surgeon: Nicholes Stairs, MD;  Location: [facility];  Service: Orthopedics;  Laterality: Left;  . OVARIAN CYST SURGERY  1948  . PACEMAKER GENERATOR CHANGE  11/18/08   [employer]ude  . PACEMAKER INSERTION  08/26/01   [employer]ude  . PPM GENERATOR CHANGEOUT N/A 05/16/2017   Procedure: PPM GENERATOR CHANGEOUT;  Surgeon: Sanda Klein, MD;  Location: [place] [facility];  Service: Cardiovascular;  Laterality: N/A;    There were no vitals filed for this visit.  Subjective Assessment - 01/03/18 1116    Subjective   We didn't get a chance to do the theraband ex's at home yet (per son report)     Pertinent History  Lt humerus fx from fall on 08/13/17, s/p ORIF 08/15/17. PMH: CABG 99, pacemaker 2003, PAD, HTN, AV repair, CKD stage III    Limitations  fall risk, pacemaker    Patient Stated Goals  Get my Lt arm stronger and get my balance better    Currently in Pain?  Yes    Pain Score  5     Pain Location  Leg    Pain Orientation  Right    Pain Descriptors / Indicators  Aching  Pain Type  Chronic pain    Pain Frequency  Intermittent    Aggravating Factors   walking    Pain Relieving Factors  rest                   OT Treatments/Exercises (OP) - 01/03/18 0001      ADLs   Functional Mobility  Standing for 13 min. without rest to fold laundry at tabletop mod I level (chair behind her for safety)     ADL Comments  Discussed safety considerations/fall prevention with mobility in kitchen - will practice further next session. Pt/son asked to consider things like toaster oven, [employer] grill to avoid bending over regular oven. Pt's son reports she has a wall oven. Also recommended sturdy chair or stool for patient in kitchen to sit when needed.       Exercises   Exercises  --   Reviewed theraband HEP - pt demo each 10 reps                 OT Long  Term Goals - 01/03/18 1226      OT LONG TERM GOAL #1   Title  Independent with HEP for LUE ROM/strengthening    Time  4    Period  Weeks    Status  Achieved      OT LONG TERM GOAL #2   Title  Pt to fold towels in standing safely mod I level    Time  4    Period  Weeks    Status  Achieved      OT LONG TERM GOAL #3   Title  Pt to verbalize understanding with A/E and task modifications to increase ease and safety w/ IADLS and reduce risk of falls    Time  4    Period  Weeks    Status  New      OT LONG TERM GOAL #4   Title  Pt to perform simple stovetop cooking with supervision safely     Time  4    Period  Weeks    Status  New            Plan - 01/03/18 1226    Clinical Impression Statement  Pt met LTG's #1-2. Pt progressing towards remaining goals.     Occupational Profile and client history currently impacting functional performance  PMH; HTN, AV repair, CKD stage 3, PAD, CABG 1999, pacemaker placement 2003    Occupational performance deficits (Please refer to evaluation for details):  IADL's    Rehab Potential  Good    OT Frequency  2x / week    OT Duration  4 weeks    OT Treatment/Interventions  Self-care/ADL training;DME and/or AE instruction;Therapeutic activities;Therapeutic exercise;Neuromuscular education;Functional Mobility Training;Patient/family education;Manual Therapy    Plan  progress towards remaining goals, possible d/c next week.     Consulted and Agree with Plan of Care  Patient;Family member/caregiver    Family Member Consulted  son       Patient will benefit from skilled therapeutic intervention in order to improve the following deficits and impairments:  Decreased range of motion, Improper body mechanics, Impaired UE functional use, Decreased endurance, Decreased balance, Decreased mobility, Decreased strength, Decreased activity tolerance, Improper spinal/pelvic alignment  Visit Diagnosis: Muscle weakness (generalized)  Unsteadiness on  feet    Problem List Patient Active Problem List   Diagnosis Date Noted  . Elevated troponin   . Asthma 08/15/2017  . Open left humeral  fracture 08/15/2017  . Fracture 08/15/2017  . Pacemaker battery depletion 05/17/2017  . Leg cramps 06/12/2016  . Peripheral arterial disease ([place]) 12/08/2014  . Status post ascending aortic aneurysm repair/AVR -  Medtronic Freestyle root 09/28/2014  . CAD s/p CABG (LIMA to LAD) 07/08/2013  . History of aortic root repairand bioprosthetic AVR 07/08/2013  . Hyperlipidemia 07/08/2013  . SSS (sick sinus syndrome) ([place]) 07/08/2013  . Second degree AV block 07/08/2013  . Pacemaker, dual chamber St. Jude 2010 07/08/2013  . Essential hypertension 07/08/2013    Carey Bullocks, OTR/L 01/03/2018, 12:28 PM  [facility] [address] [name], [place], [address] Phone: [phone]   Fax:  [phone]  Name: Holly Bolton MRN: [id number] Date of Birth: 1922/09/27

## 2018-01-07 ENCOUNTER — Ambulatory Visit: Payer: Medicare Other | Admitting: Occupational Therapy

## 2018-01-07 DIAGNOSIS — R2689 Other abnormalities of gait and mobility: Secondary | ICD-10-CM | POA: Diagnosis not present

## 2018-01-07 DIAGNOSIS — R2681 Unsteadiness on feet: Secondary | ICD-10-CM

## 2018-01-07 DIAGNOSIS — M6281 Muscle weakness (generalized): Secondary | ICD-10-CM

## 2018-01-07 NOTE — Therapy (Signed)
[facility] [address] [place], [place], [address] Phone: [phone]   Fax:  [phone]  Occupational Therapy Treatment  Patient Details  Name: Holly Bolton MRN: [id number] Date of Birth: 17-Mar-1922 No data recorded  Encounter Date: 01/07/2018  OT End of Session - 01/07/18 1225    Visit Number  6    Number of Visits  9    Date for OT Re-Evaluation  12/28/17    Authorization Type  MCR primary/BCBS secondary    Authorization - Visit Number  6    Authorization - Number of Visits  10    OT Start Time  1100    OT Stop Time  1150    OT Time Calculation (min)  50 min    Activity Tolerance  Patient limited by fatigue    Behavior During Therapy  [facility] for tasks assessed/performed       Past Medical History:  Diagnosis Date  . Aneurysm (arteriovenous) of coronary vessels    ascending aorta requiring bypass of the LAD  . Asthma   . Carotid stenosis 04/28/08   Doppler: <40% stenosis bilateral  . Hyperlipemia   . Hypertension   . Pacemaker generator end of life 11/18/08   Intermittent high-grade atrioventricular block  . Peripheral arterial disease (HCC)    left ABI of 0.78  . Presence of permanent cardiac pacemaker 08/26/01   Sinus node dysfunction-St.Jude  . Status post ascending aortic aneurysm repair/AVR -  Medtronic Freestyle root 09/28/2014   ascending aorta requiring bypass of the LAD     Past Surgical History:  Procedure Laterality Date  . ABDOMINAL HYSTERECTOMY  1966  . APPENDECTOMY  1943  . ASCENDING AORTIC ANEURYSM REPAIR  09/22/97   porcine aortic root  . BREAST FIBROADENOMA SURGERY  11/89  . CARDIAC SURGERY    . CORONARY ARTERY BYPASS GRAFT  09/22/97   LIMA to the LAD  . I&D EXTREMITY Left 08/15/2017   Procedure: IRRIGATION AND DEBRIDEMENT EXTREMITY;  Surgeon: Nicholes Stairs, MD;  Location: [facility];  Service: Orthopedics;  Laterality: Left;  . [place]   left  . NEPHRECTOMY   1958   right  . ORIF HUMERUS FRACTURE Left 08/15/2017   Procedure: OPEN REDUCTION INTERNAL FIXATION (ORIF) PROXIMAL HUMERUS FRACTURE;  Surgeon: Nicholes Stairs, MD;  Location: [facility];  Service: Orthopedics;  Laterality: Left;  . OVARIAN CYST SURGERY  1948  . PACEMAKER GENERATOR CHANGE  11/18/08   [facility]Jude  . PACEMAKER INSERTION  08/26/01   [employer]Jude  . PPM GENERATOR CHANGEOUT N/A 05/16/2017   Procedure: PPM GENERATOR CHANGEOUT;  Surgeon: Sanda Klein, MD;  Location: [facility];  Service: Cardiovascular;  Laterality: N/A;    There were no vitals filed for this visit.  Subjective Assessment - 01/07/18 1112    Subjective   I fell last night standing to put my pants on. (Pt fell on Rt side)    Pertinent History  Lt humerus fx from fall on 08/13/17, s/p ORIF 08/15/17. PMH: CABG 99, pacemaker 2003, PAD, HTN, AV repair, CKD stage III    Limitations  fall risk, pacemaker    Patient Stated Goals  Get my Lt arm stronger and get my balance better    Pain Score  5     Pain Location  Shoulder    Pain Orientation  Right    Pain Descriptors / Indicators  Sore;Tender    Pain Frequency  Constant  Aggravating Factors   since fall on Rt side yesterday    Pain Relieving Factors  rest                   OT Treatments/Exercises (OP) - 01/07/18 0001      ADLs   Functional Mobility  Discussed safety/fall prevention during kitchen tasks including one hand countertop support and had pt demo retrieving small pot and filling with water and putting on stove, then removing. Also discussed task modifications for sweeping and laundry to prevent falls    ADL Comments  Discussed safety w/ donning/doffing pants while seated to prevent future falls secondary to pt falling while standing to put pants on.       Reviewed theraband HEP with son per request            OT Long Term Goals - 01/07/18 1226      OT LONG TERM GOAL #1   Title  Independent with HEP for LUE ROM/strengthening     Time  4    Period  Weeks    Status  Achieved      OT LONG TERM GOAL #2   Title  Pt to fold towels in standing safely mod I level    Time  4    Period  Weeks    Status  Achieved      OT LONG TERM GOAL #3   Title  Pt to verbalize understanding with A/E and task modifications to increase ease and safety w/ IADLS and reduce risk of falls    Time  4    Period  Weeks    Status  On-going      OT LONG TERM GOAL #4   Title  Pt to perform simple stovetop cooking with supervision safely     Time  4    Period  Weeks    Status  Achieved   per son report           Plan - 01/07/18 1226    Clinical Impression Statement  Pt approximating remaining goal. Pt had another fall recently and remains at high fall risk    Occupational Profile and client history currently impacting functional performance  PMH; HTN, AV repair, CKD stage 3, PAD, CABG 1999, pacemaker placement 2003    Occupational performance deficits (Please refer to evaluation for details):  IADL's    Rehab Potential  Good    OT Frequency  2x / week    OT Duration  4 weeks    OT Treatment/Interventions  Self-care/ADL training;DME and/or AE instruction;Therapeutic activities;Therapeutic exercise;Neuromuscular education;Functional Mobility Training;Patient/family education;Manual Therapy    Plan  review goals and d/c next session    Consulted and Agree with Plan of Care  Patient;Family member/caregiver    Family Member Consulted  son       Patient will benefit from skilled therapeutic intervention in order to improve the following deficits and impairments:  Decreased range of motion, Improper body mechanics, Impaired UE functional use, Decreased endurance, Decreased balance, Decreased mobility, Decreased strength, Decreased activity tolerance, Improper spinal/pelvic alignment  Visit Diagnosis: Muscle weakness (generalized)  Unsteadiness on feet    Problem List Patient Active Problem List   Diagnosis Date Noted  . Elevated  troponin   . Asthma 08/15/2017  . Open left humeral fracture 08/15/2017  . Fracture 08/15/2017  . Pacemaker battery depletion 05/17/2017  . Leg cramps 06/12/2016  . Peripheral arterial disease ([place]) 12/08/2014  . Status post ascending aortic aneurysm repair/AVR -  Medtronic Freestyle root 09/28/2014  . CAD s/p CABG (LIMA to LAD) 07/08/2013  . History of aortic root repairand bioprosthetic AVR 07/08/2013  . Hyperlipidemia 07/08/2013  . SSS (sick sinus syndrome) ([place]) 07/08/2013  . Second degree AV block 07/08/2013  . Pacemaker, dual chamber St. Jude 2010 07/08/2013  . Essential hypertension 07/08/2013    Carey Bullocks, OTR/L 01/07/2018, 1:25 PM  [facility] [address] [place], [place], [address] Phone: [phone]   Fax:  [phone]  Name: Holly Bolton MRN: [id number] Date of Birth: January 17, 1923

## 2018-01-08 ENCOUNTER — Ambulatory Visit: Payer: Medicare Other | Admitting: Occupational Therapy

## 2018-01-08 ENCOUNTER — Ambulatory Visit: Payer: Medicare Other | Admitting: Physical Therapy

## 2018-01-08 DIAGNOSIS — R2689 Other abnormalities of gait and mobility: Secondary | ICD-10-CM | POA: Diagnosis not present

## 2018-01-08 DIAGNOSIS — M6281 Muscle weakness (generalized): Secondary | ICD-10-CM

## 2018-01-08 DIAGNOSIS — R2681 Unsteadiness on feet: Secondary | ICD-10-CM | POA: Diagnosis not present

## 2018-01-08 NOTE — Therapy (Signed)
[facility] [address] [place], [place], [address] Phone: [phone]   Fax:  [phone]  Occupational Therapy Treatment  Patient Details  Name: Holly Bolton MRN: [id number] Date of Birth: 23-Dec-1922 No data recorded  Encounter Date: 01/08/2018  OT End of Session - 01/08/18 1053    Visit Number  7    Number of Visits  9    Date for OT Re-Evaluation  12/28/17    Authorization Type  MCR primary/BCBS secondary    Authorization - Visit Number  7    Authorization - Number of Visits  10    OT Start Time  1028    OT Stop Time  1055    OT Time Calculation (min)  27 min    Activity Tolerance  Patient limited by fatigue    Behavior During Therapy  [facility] for tasks assessed/performed       Past Medical History:  Diagnosis Date  . Aneurysm (arteriovenous) of coronary vessels    ascending aorta requiring bypass of the LAD  . Asthma   . Carotid stenosis 04/28/08   Doppler: <40% stenosis bilateral  . Hyperlipemia   . Hypertension   . Pacemaker generator end of life 11/18/08   Intermittent high-grade atrioventricular block  . Peripheral arterial disease (HCC)    left ABI of 0.78  . Presence of permanent cardiac pacemaker 08/26/01   Sinus node dysfunction-St.Jude  . Status post ascending aortic aneurysm repair/AVR -  Medtronic Freestyle root 09/28/2014   ascending aorta requiring bypass of the LAD     Past Surgical History:  Procedure Laterality Date  . ABDOMINAL HYSTERECTOMY  1966  . APPENDECTOMY  1943  . ASCENDING AORTIC ANEURYSM REPAIR  09/22/97   porcine aortic root  . BREAST FIBROADENOMA SURGERY  11/89  . CARDIAC SURGERY    . CORONARY ARTERY BYPASS GRAFT  09/22/97   LIMA to the LAD  . I&D EXTREMITY Left 08/15/2017   Procedure: IRRIGATION AND DEBRIDEMENT EXTREMITY;  Surgeon: Nicholes Stairs, MD;  Location: [facility];  Service: Orthopedics;  Laterality: Left;  . [place]   left  . NEPHRECTOMY   1958   right  . ORIF HUMERUS FRACTURE Left 08/15/2017   Procedure: OPEN REDUCTION INTERNAL FIXATION (ORIF) PROXIMAL HUMERUS FRACTURE;  Surgeon: Nicholes Stairs, MD;  Location: [facility];  Service: Orthopedics;  Laterality: Left;  . OVARIAN CYST SURGERY  1948  . PACEMAKER GENERATOR CHANGE  11/18/08   [facility]Jude  . PACEMAKER INSERTION  08/26/01   St.Jude  . PPM GENERATOR CHANGEOUT N/A 05/16/2017   Procedure: PPM GENERATOR CHANGEOUT;  Surgeon: Sanda Klein, MD;  Location: [facility];  Service: Cardiovascular;  Laterality: N/A;    There were no vitals filed for this visit.  Subjective Assessment - 01/08/18 1030    Subjective   I'm tired    Pertinent History  Lt humerus fx from fall on 08/13/17, s/p ORIF 08/15/17. PMH: CABG 99, pacemaker 2003, PAD, HTN, AV repair, CKD stage III    Limitations  fall risk, pacemaker    Patient Stated Goals  Get my Lt arm stronger and get my balance better    Currently in Pain?  Yes    Pain Score  5     Pain Location  Shoulder    Pain Orientation  Right    Pain Descriptors / Indicators  Sore;Tender       Cane ex's seated: bilateral shoulder flexion, abduction to Lt  side, and bilateral ER/IR x 10 reps each.  Assessed remaining goals and progress to date. Reviewed recommendations with pt/son. Discussed deep breathing techniques for effective breathing.                          OT Long Term Goals - 01/08/18 1055      OT LONG TERM GOAL #1   Title  Independent with HEP for LUE ROM/strengthening    Time  4    Period  Weeks    Status  Achieved      OT LONG TERM GOAL #2   Title  Pt to fold towels in standing safely mod I level    Time  4    Period  Weeks    Status  Achieved      OT LONG TERM GOAL #3   Title  Pt to verbalize understanding with A/E and task modifications to increase ease and safety w/ IADLS and reduce risk of falls    Time  4    Period  Weeks    Status  Achieved      OT LONG TERM GOAL #4   Title  Pt to perform  simple stovetop cooking with supervision safely     Time  4    Period  Weeks    Status  Achieved   per son report           Plan - 01/08/18 1055    Clinical Impression Statement  Pt has met all LTG's. Pt remains at high fall risk and recommends direct supervision if pt heats anything on stove, and for son to transport laundry    Occupational Profile and client history currently impacting functional performance  PMH; HTN, AV repair, CKD stage 3, PAD, CABG 1999, pacemaker placement 2003    Occupational performance deficits (Please refer to evaluation for details):  IADL's    Rehab Potential  Good    OT Frequency  2x / week    OT Duration  4 weeks    OT Treatment/Interventions  Self-care/ADL training;DME and/or AE instruction;Therapeutic activities;Therapeutic exercise;Neuromuscular education;Functional Mobility Training;Patient/family education;Manual Therapy    Plan  D/C O.T.        Patient will benefit from skilled therapeutic intervention in order to improve the following deficits and impairments:  Decreased range of motion, Improper body mechanics, Impaired UE functional use, Decreased endurance, Decreased balance, Decreased mobility, Decreased strength, Decreased activity tolerance, Improper spinal/pelvic alignment  Visit Diagnosis: Muscle weakness (generalized)    Problem List Patient Active Problem List   Diagnosis Date Noted  . Elevated troponin   . Asthma 08/15/2017  . Open left humeral fracture 08/15/2017  . Fracture 08/15/2017  . Pacemaker battery depletion 05/17/2017  . Leg cramps 06/12/2016  . Peripheral arterial disease ([place]) 12/08/2014  . Status post ascending aortic aneurysm repair/AVR -  Medtronic Freestyle root 09/28/2014  . CAD s/p CABG (LIMA to LAD) 07/08/2013  . History of aortic root repairand bioprosthetic AVR 07/08/2013  . Hyperlipidemia 07/08/2013  . SSS (sick sinus syndrome) ([place]) 07/08/2013  . Second degree AV block 07/08/2013  . Pacemaker,  dual chamber St. Jude 2010 07/08/2013  . Essential hypertension 07/08/2013   OCCUPATIONAL THERAPY DISCHARGE SUMMARY  Visits from Start of Care: 7   Current functional level related to goals / functional outcomes: See above   Remaining deficits: Balance Endurance    Education / Equipment: HEP'S for LUE, Task modifications to help decrease fall risk Plan:  Patient agrees to discharge.  Patient goals were met. Patient is being discharged due to meeting the stated rehab goals.  ?????        Carey Bullocks, OTR/L 01/08/2018, 10:57 AM  [facility] [address] [place], [place], [address] Phone: [phone]   Fax:  [phone]  Name: ILDA LASKIN MRN: [id number] Date of Birth: 16-Nov-1922

## 2018-01-09 ENCOUNTER — Encounter: Payer: Self-pay | Admitting: Physical Therapy

## 2018-01-09 NOTE — Therapy (Signed)
[facility] [address] [place], [place], [address] Phone: [phone]   Fax:  [phone]  Physical Therapy Treatment  Patient Details  Name: Holly Bolton MRN: [id number] Date of Birth: [birth year] Referring Provider (PT): Dr. Jani Gravel   Encounter Date: 01/08/2018  PT End of Session - 01/09/18 1426    Visit Number  7    Number of Visits  9    Date for PT Re-Evaluation  01/07/18    Authorization Type  Medicare & BCBS supplement     Authorization Time Period  11-20-17 - 02-12-18    PT Start Time  1104    PT Stop Time  1150    PT Time Calculation (min)  46 min       Past Medical History:  Diagnosis Date  . Aneurysm (arteriovenous) of coronary vessels    ascending aorta requiring bypass of the LAD  . Asthma   . Carotid stenosis 04/28/08   Doppler: <40% stenosis bilateral  . Hyperlipemia   . Hypertension   . Pacemaker generator end of life 11/18/08   Intermittent high-grade atrioventricular block  . Peripheral arterial disease (HCC)    left ABI of 0.78  . Presence of permanent cardiac pacemaker 08/26/01   Sinus node dysfunction-St.Jude  . Status post ascending aortic aneurysm repair/AVR -  Medtronic Freestyle root 09/28/2014   ascending aorta requiring bypass of the LAD     Past Surgical History:  Procedure Laterality Date  . ABDOMINAL HYSTERECTOMY  1966  . APPENDECTOMY  1943  . ASCENDING AORTIC ANEURYSM REPAIR  09/22/97   porcine aortic root  . BREAST FIBROADENOMA SURGERY  11/89  . CARDIAC SURGERY    . CORONARY ARTERY BYPASS GRAFT  09/22/97   LIMA to the LAD  . I&D EXTREMITY Left 08/15/2017   Procedure: IRRIGATION AND DEBRIDEMENT EXTREMITY;  Surgeon: Nicholes Stairs, MD;  Location: [facility];  Service: Orthopedics;  Laterality: Left;  . [place]   left  . NEPHRECTOMY  1958   right  . ORIF HUMERUS FRACTURE Left 08/15/2017   Procedure: OPEN REDUCTION INTERNAL FIXATION (ORIF) PROXIMAL  HUMERUS FRACTURE;  Surgeon: Nicholes Stairs, MD;  Location: [facility];  Service: Orthopedics;  Laterality: Left;  . OVARIAN CYST SURGERY  1948  . PACEMAKER GENERATOR CHANGE  11/18/08   [employer]Jude  . PACEMAKER INSERTION  08/26/01   [employer]ude  . PPM GENERATOR CHANGEOUT N/A 05/16/2017   Procedure: PPM GENERATOR CHANGEOUT;  Surgeon: Sanda Klein, MD;  Location: [facility];  Service: Cardiovascular;  Laterality: N/A;    There were no vitals filed for this visit.  Subjective Assessment - 01/09/18 1424    Subjective  Pt fell at home on Sunday - was standing up to put on her pants and lost balance; pt has large bruise over Rt lateral eye region/forehead    Patient is accompained by:  Family member    Pertinent History  fall sustained on 08-13-17 - pt fractured Lt humerus; ORIF performed on 08-15-17:  CABG 09-22-97:  pacemaker 2003 with generator changeout 05-16-17:  PAD:  s/p ascending aortic aneurysm repair 09-28-14:  HTN    Patient Stated Goals  Be able to get around without fear of falling    Currently in Pain?  Yes    Pain Score  5     Pain Location  Shoulder    Pain Orientation  Right    Pain Descriptors / Indicators  Tender;Sore  Pain Type  Chronic pain    Pain Frequency  Constant                       [facility] Adult PT Treatment/Exercise - 01/09/18 0001      Transfers   Transfers  Sit to Stand    Sit to Stand  4: Min guard    Number of Reps  10 reps    Transfer Cueing  cues to lean forward      Ambulation/Gait   Ambulation/Gait  Yes    Ambulation/Gait Assistance  4: Min guard    Ambulation/Gait Assistance Details  cues to stand erect and for step length    Ambulation Distance (Feet)  230 Feet    Assistive device  Straight cane   with rubber quad tip   Gait Pattern  Step-through pattern;Decreased step length - right;Decreased step length - left   increased weight shift posteriorly   Ambulation Surface  Level;Indoor    Stairs  Yes    Stairs Assistance  4: Min  guard    Stair Management Technique  Two rails;Step to pattern;Forwards    Number of Stairs  4    Height of Stairs  6      Knee/Hip Exercises: Standing   Heel Raises  Both;1 set;10 reps    Forward Step Up  Both;1 set;5 reps;Hand Hold: 2;Step Height: 6"          Balance Exercises - 01/09/18 1437      Balance Exercises: Standing   Rockerboard  Anterior/posterior;Lateral;10 reps;UE support    Step Over Hurdles / Cones  5 reps each leg with mod hand held assist    Other Standing Exercises  Pt performed ladder activity with mod hand held assist              PT Long Term Goals - 01/09/18 1427      PT LONG TERM GOAL #1   Title  Improve Berg score from 26/56 to >/= 31/56 to reduce fall risk. NEW TARGET DATE FOR LTGS: 01/07/18, as pt missed two weeks of PT 2/2 illness    Baseline  26/56 on 11-20-17    Time  4    Period  Weeks    Status  On-going    Target Date  01/29/18      PT LONG TERM GOAL #2   Title  Improve TUG score from 24.94 secs with SPC to </= 20 secs with SPC to demonstrate improved functional mobility.      Baseline  24.94 secs on 11-20-17    Time  4    Period  Weeks    Status  On-going    Target Date  01/29/18      PT LONG TERM GOAL #3   Title  Pt will increase gait velocity from 1.33 ft/sec with cane to >/= 1.6 ft/sec with cane for incr. gait efficiency.    Baseline  24.61 secs = 1.33 ft/sec with SPC on 11-20-17    Time  4    Period  Weeks    Status  On-going    Target Date  01/29/18      PT LONG TERM GOAL #4   Title  Amb. 350' with rollator on flat, even surface with SBA for increased community accessbility.      Time  4    Period  Weeks    Status  On-going    Target Date  01/29/18  PT LONG TERM GOAL #5   Title  Negotiate 4 steps with use of bil. hand rails using a step over step sequence.    Time  4    Period  Weeks    Status  On-going    Target Date  01/29/18      PT LONG TERM GOAL #6   Title  Independent in HEP for balance &  strengthening exs.    Time  4    Period  Weeks    Status  On-going    Target Date  01/29/18            Plan - 01/09/18 1431    Clinical Impression Statement  Pt's mobility decreased today due to fall sustained on Sunday, 01-06-18 in her home in which she hit Rt side of her head (pt states she was standing to don her pants):  pt continues to have decreased standing balance and would be safer with use of rollator but pt declines this device and continues to use cane for assistance with ambulation.      Rehab Potential  Good    PT Frequency  2x / week    PT Duration  4 weeks    PT Treatment/Interventions  ADLs/Self Care Home Management;DME Instruction;Gait training;Stair training;Therapeutic activities;Therapeutic exercise;Balance training;Neuromuscular re-education;Patient/family education;Cryotherapy    PT Next Visit Plan  Circuit training. cont balance and gait training; functional strengthening exercises, endurance.     PT Home Exercise Plan  see above; balance HEP given on 12-04-17    Consulted and Agree with Plan of Care  Patient;Family member/caregiver    Family Member Consulted  son - Ed       Patient will benefit from skilled therapeutic intervention in order to improve the following deficits and impairments:  Abnormal gait, Decreased balance, Decreased strength, Impaired UE functional use, Postural dysfunction  Visit Diagnosis: Unsteadiness on feet  Other abnormalities of gait and mobility  Muscle weakness (generalized)     Problem List Patient Active Problem List   Diagnosis Date Noted  . Elevated troponin   . Asthma 08/15/2017  . Open left humeral fracture 08/15/2017  . Fracture 08/15/2017  . Pacemaker battery depletion 05/17/2017  . Leg cramps 06/12/2016  . Peripheral arterial disease ([place]) 12/08/2014  . Status post ascending aortic aneurysm repair/AVR -  Medtronic Freestyle root 09/28/2014  . CAD s/p CABG (LIMA to LAD) 07/08/2013  . History of aortic root  repairand bioprosthetic AVR 07/08/2013  . Hyperlipidemia 07/08/2013  . SSS (sick sinus syndrome) ([place]) 07/08/2013  . Second degree AV block 07/08/2013  . Pacemaker, dual chamber St. Jude 2010 07/08/2013  . Essential hypertension 07/08/2013    Alda Lea, PT 01/09/2018, 2:37 PM  [facility] [address] [place], [place], [address] Phone: [phone]   Fax:  [phone]  Name: AMAREE LOISEL MRN: [id number] Date of Birth: 08-Jun-1922

## 2018-01-21 ENCOUNTER — Ambulatory Visit: Payer: Medicare Other | Attending: Internal Medicine | Admitting: Physical Therapy

## 2018-01-21 DIAGNOSIS — R2689 Other abnormalities of gait and mobility: Secondary | ICD-10-CM | POA: Insufficient documentation

## 2018-01-21 DIAGNOSIS — R2681 Unsteadiness on feet: Secondary | ICD-10-CM | POA: Insufficient documentation

## 2018-01-21 DIAGNOSIS — M6281 Muscle weakness (generalized): Secondary | ICD-10-CM | POA: Insufficient documentation

## 2018-01-22 ENCOUNTER — Encounter: Payer: Self-pay | Admitting: Physical Therapy

## 2018-01-22 NOTE — Therapy (Signed)
[facility] [address] [place], [place], [address] Phone: [phone]   Fax:  [phone]  Physical Therapy Treatment  Patient Details  Name: Holly Bolton MRN: [id number] Date of Birth: [birth year] Referring Provider (PT): Dr. Jani Gravel   Encounter Date: 01/21/2018  PT End of Session - 01/22/18 1950    Visit Number  8    Number of Visits  9    Date for PT Re-Evaluation  01/07/18    Authorization Time Period  11-20-17 - 02-12-18    PT Start Time  1315    PT Stop Time  1401    PT Time Calculation (min)  46 min       Past Medical History:  Diagnosis Date  . Aneurysm (arteriovenous) of coronary vessels    ascending aorta requiring bypass of the LAD  . Asthma   . Carotid stenosis 04/28/08   Doppler: <40% stenosis bilateral  . Hyperlipemia   . Hypertension   . Pacemaker generator end of life 11/18/08   Intermittent high-grade atrioventricular block  . Peripheral arterial disease (HCC)    left ABI of 0.78  . Presence of permanent cardiac pacemaker 08/26/01   Sinus node dysfunction-St.Jude  . Status post ascending aortic aneurysm repair/AVR -  Medtronic Freestyle root 09/28/2014   ascending aorta requiring bypass of the LAD     Past Surgical History:  Procedure Laterality Date  . ABDOMINAL HYSTERECTOMY  1966  . APPENDECTOMY  1943  . ASCENDING AORTIC ANEURYSM REPAIR  09/22/97   porcine aortic root  . BREAST FIBROADENOMA SURGERY  11/89  . CARDIAC SURGERY    . CORONARY ARTERY BYPASS GRAFT  09/22/97   LIMA to the LAD  . I&D EXTREMITY Left 08/15/2017   Procedure: IRRIGATION AND DEBRIDEMENT EXTREMITY;  Surgeon: Nicholes Stairs, MD;  Location: [facility];  Service: Orthopedics;  Laterality: Left;  . [place]   left  . NEPHRECTOMY  1958   right  . ORIF HUMERUS FRACTURE Left 08/15/2017   Procedure: OPEN REDUCTION INTERNAL FIXATION (ORIF) PROXIMAL HUMERUS FRACTURE;  Surgeon: Nicholes Stairs, MD;   Location: [facility];  Service: Orthopedics;  Laterality: Left;  . OVARIAN CYST SURGERY  1948  . PACEMAKER GENERATOR CHANGE  11/18/08   [employer]  . PACEMAKER INSERTION  08/26/01   [employer]  . PPM GENERATOR CHANGEOUT N/A 05/16/2017   Procedure: PPM GENERATOR CHANGEOUT;  Surgeon: Sanda Klein, MD;  Location: [facility];  Service: Cardiovascular;  Laterality: N/A;    There were no vitals filed for this visit.  Subjective Assessment - 01/22/18 1945    Subjective  Pt reports she woke up this morning with dizziness; son, Ed, states he gave her a Dramamine; pt reports very little dizziness at this time    Patient is accompained by:  Family member   son, Ed   Pertinent History  fall sustained on 08-13-17 - pt fractured Lt humerus; ORIF performed on 08-15-17:  CABG 09-22-97:  pacemaker 2003 with generator changeout 05-16-17:  PAD:  s/p ascending aortic aneurysm repair 09-28-14:  HTN    Patient Stated Goals  Be able to get around without fear of falling    Currently in Pain?  No/denies                       [facility] Adult PT Treatment/Exercise - 01/22/18 0001      Transfers   Transfers  Sit to Stand  Sit to Stand  4: Min guard    Number of Reps  Other reps (comment)   5   Transfer Cueing  cues to lean forward       Ambulation/Gait   Ambulation/Gait  Yes    Ambulation/Gait Assistance  4: Min guard    Ambulation/Gait Assistance Details  cues to stand erect and to look up rather than looking down    Ambulation Distance (Feet)  230 Feet    Assistive device  Straight cane   with rubber quad tip   Gait Pattern  Step-through pattern;Decreased step length - right;Decreased step length - left   increased weight shift posteriorly   Ambulation Surface  Level;Indoor      Knee/Hip Exercises: Standing   Heel Raises  Both;1 set;10 reps          Balance Exercises - 01/22/18 1950      Balance Exercises: Standing   Other Standing Exercises  Pt performed ladder activity with mod hand  held assist       Pt performed stepping strategy - alternate stepping forward/back 5 reps each leg with CGA  Tap ups to 1st step with minimal UE support with CGA Cone taps (3 cones used) with min hand held assist ; touching balance bubbles (3) with each foot with min hand held assist for balance    TherEx; pt performed standing hip flexion, extension and abduction with 2# weight on each leg 10 reps each  Nustep level 3 x 3" with UE's and LE's - pt requested to stop after 3" due to fatigue        PT Long Term Goals - 01/09/18 1427      PT LONG TERM GOAL #1   Title  Improve Berg score from 26/56 to >/= 31/56 to reduce fall risk. NEW TARGET DATE FOR LTGS: 01/07/18, as pt missed two weeks of PT 2/2 illness    Baseline  26/56 on 11-20-17    Time  4    Period  Weeks    Status  On-going    Target Date  01/29/18      PT LONG TERM GOAL #2   Title  Improve TUG score from 24.94 secs with SPC to </= 20 secs with SPC to demonstrate improved functional mobility.      Baseline  24.94 secs on 11-20-17    Time  4    Period  Weeks    Status  On-going    Target Date  01/29/18      PT LONG TERM GOAL #3   Title  Pt will increase gait velocity from 1.33 ft/sec with cane to >/= 1.6 ft/sec with cane for incr. gait efficiency.    Baseline  24.61 secs = 1.33 ft/sec with SPC on 11-20-17    Time  4    Period  Weeks    Status  On-going    Target Date  01/29/18      PT LONG TERM GOAL #4   Title  Amb. 350' with rollator on flat, even surface with SBA for increased community accessbility.      Time  4    Period  Weeks    Status  On-going    Target Date  01/29/18      PT LONG TERM GOAL #5   Title  Negotiate 4 steps with use of bil. hand rails using a step over step sequence.    Time  4    Period  Weeks  Status  On-going    Target Date  01/29/18      PT LONG TERM GOAL #6   Title  Independent in HEP for balance & strengthening exs.    Time  4    Period  Weeks    Status  On-going    Target  Date  01/29/18            Plan - 01/22/18 1952    Clinical Impression Statement  Pt had symptoms consistent with BPPV but no vertigo provoked with any positional testing; pt had (-) Rt and Lt sidelying tests, with pt having very limited active and passive cervical extension and therefore, requesting not to attempt Dix-Hallpike tests;  son had given pt Meclizine in am prior to treatment session which may have suppressed nystagmus and provocation of vertigo.  Pt improved with sit to stand transfers with pt able to lean more anteriorly, resulting in less LOB posteriorly.    Rehab Potential  Good    PT Frequency  2x / week    PT Duration  4 weeks    PT Treatment/Interventions  ADLs/Self Care Home Management;DME Instruction;Gait training;Stair training;Therapeutic activities;Therapeutic exercise;Balance training;Neuromuscular re-education;Patient/family education;Cryotherapy    PT Next Visit Plan  Circuit training. cont balance and gait training; functional strengthening exercises, endurance.     PT Home Exercise Plan  see above; balance HEP given on 12-04-17    Consulted and Agree with Plan of Care  Patient;Family member/caregiver    Family Member Consulted  son - Ed       Patient will benefit from skilled therapeutic intervention in order to improve the following deficits and impairments:  Abnormal gait, Decreased balance, Decreased strength, Impaired UE functional use, Postural dysfunction  Visit Diagnosis: Unsteadiness on feet  Other abnormalities of gait and mobility  Muscle weakness (generalized)     Problem List Patient Active Problem List   Diagnosis Date Noted  . Elevated troponin   . Asthma 08/15/2017  . Open left humeral fracture 08/15/2017  . Fracture 08/15/2017  . Pacemaker battery depletion 05/17/2017  . Leg cramps 06/12/2016  . Peripheral arterial disease ([place]) 12/08/2014  . Status post ascending aortic aneurysm repair/AVR -  Medtronic Freestyle root 09/28/2014   . CAD s/p CABG (LIMA to LAD) 07/08/2013  . History of aortic root repairand bioprosthetic AVR 07/08/2013  . Hyperlipidemia 07/08/2013  . SSS (sick sinus syndrome) ([place]) 07/08/2013  . Second degree AV block 07/08/2013  . Pacemaker, dual chamber St. Jude 2010 07/08/2013  . Essential hypertension 07/08/2013    Alda Lea, PT 01/22/2018, 8:02 PM  [facility] [address] [place], [place], [address] Phone: [phone]   Fax:  [phone]  Name: Holly Bolton MRN: [id number] Date of Birth: 1922-11-26

## 2018-01-24 ENCOUNTER — Ambulatory Visit: Payer: Medicare Other | Admitting: Physical Therapy

## 2018-01-24 DIAGNOSIS — R2681 Unsteadiness on feet: Secondary | ICD-10-CM | POA: Diagnosis not present

## 2018-01-24 DIAGNOSIS — R2689 Other abnormalities of gait and mobility: Secondary | ICD-10-CM | POA: Diagnosis not present

## 2018-01-24 DIAGNOSIS — M6281 Muscle weakness (generalized): Secondary | ICD-10-CM

## 2018-01-25 NOTE — Therapy (Signed)
[facility] [address] [place], [place], [address] Phone: [phone]   Fax:  [phone]  Physical Therapy Treatment  Patient Details  Name: Holly Bolton MRN: [id number] Date of Birth: 1922-06-14 Referring Provider (PT): Dr. Jani Gravel   Encounter Date: 01/24/2018  PT End of Session - 01/25/18 1332    Visit Number  9    Number of Visits  9    Date for PT Re-Evaluation  01/07/18    Authorization Type  Medicare & BCBS supplement     Authorization Time Period  11-20-17 - 02-12-18    PT Start Time  1316    PT Stop Time  1400    PT Time Calculation (min)  44 min    Equipment Utilized During Treatment  Gait belt       Past Medical History:  Diagnosis Date  . Aneurysm (arteriovenous) of coronary vessels    ascending aorta requiring bypass of the LAD  . Asthma   . Carotid stenosis 04/28/08   Doppler: <40% stenosis bilateral  . Hyperlipemia   . Hypertension   . Pacemaker generator end of life 11/18/08   Intermittent high-grade atrioventricular block  . Peripheral arterial disease (HCC)    left ABI of 0.78  . Presence of permanent cardiac pacemaker 08/26/01   Sinus node dysfunction-St.Jude  . Status post ascending aortic aneurysm repair/AVR -  Medtronic Freestyle root 09/28/2014   ascending aorta requiring bypass of the LAD     Past Surgical History:  Procedure Laterality Date  . ABDOMINAL HYSTERECTOMY  1966  . APPENDECTOMY  1943  . ASCENDING AORTIC ANEURYSM REPAIR  09/22/97   porcine aortic root  . BREAST FIBROADENOMA SURGERY  11/89  . CARDIAC SURGERY    . CORONARY ARTERY BYPASS GRAFT  09/22/97   LIMA to the LAD  . I&D EXTREMITY Left 08/15/2017   Procedure: IRRIGATION AND DEBRIDEMENT EXTREMITY;  Surgeon: Nicholes Stairs, MD;  Location: [facility];  Service: Orthopedics;  Laterality: Left;  . [place]   left  . NEPHRECTOMY  1958   right  . ORIF HUMERUS FRACTURE Left 08/15/2017   Procedure:  OPEN REDUCTION INTERNAL FIXATION (ORIF) PROXIMAL HUMERUS FRACTURE;  Surgeon: Nicholes Stairs, MD;  Location: [facility];  Service: Orthopedics;  Laterality: Left;  . OVARIAN CYST SURGERY  1948  . PACEMAKER GENERATOR CHANGE  11/18/08   [employer]Jude  . PACEMAKER INSERTION  08/26/01   [employer]Jude  . PPM GENERATOR CHANGEOUT N/A 05/16/2017   Procedure: PPM GENERATOR CHANGEOUT;  Surgeon: Sanda Klein, MD;  Location: [facility];  Service: Cardiovascular;  Laterality: N/A;    There were no vitals filed for this visit.  Subjective Assessment - 01/25/18 1329    Subjective  Pt states "today is not a good day" ; pt very tearful - son states pt does not feel she is progressing     Patient is accompained by:  Family member   son Ed   Pertinent History  fall sustained on 08-13-17 - pt fractured Lt humerus; ORIF performed on 08-15-17:  CABG 09-22-97:  pacemaker 2003 with generator changeout 05-16-17:  PAD:  s/p ascending aortic aneurysm repair 09-28-14:  HTN    Patient Stated Goals  Be able to get around without fear of falling    Currently in Pain?  No/denies                       [facility] Adult  PT Treatment/Exercise - 01/25/18 0001      Transfers   Transfers  Sit to Stand    Sit to Stand  4: Min guard    Number of Reps  Other reps (comment)   5   Transfer Cueing  cues to lean forward      Ambulation/Gait   Ambulation/Gait  Yes    Ambulation/Gait Assistance  4: Min guard    Ambulation/Gait Assistance Details  cues to look up and to increase step length    Ambulation Distance (Feet)  230 Feet    Assistive device  Straight cane   with rubber quad tip   Gait Pattern  Step-through pattern;Decreased step length - right;Decreased step length - left   increased weight shift posteriorly   Ambulation Surface  Level;Indoor      Knee/Hip Exercises: Standing   Heel Raises  Both;1 set;10 reps    Forward Step Up  Both;1 set;5 reps;Hand Hold: 2;Step Height: 6"          Balance Exercises -  01/25/18 1331      Balance Exercises: Standing   Step Over Hurdles / Cones  5 reps each leg with mod hand held assist    Other Standing Exercises  Pt performed ladder activity with mod hand held assist       Cone taps to 3 cones - touching top of cone to improve SLS - moderate hand held assist needed to maintain balance  With this activity       PT Long Term Goals - 01/09/18 1427      PT LONG TERM GOAL #1   Title  Improve Berg score from 26/56 to >/= 31/56 to reduce fall risk. NEW TARGET DATE FOR LTGS: 01/07/18, as pt missed two weeks of PT 2/2 illness    Baseline  26/56 on 11-20-17    Time  4    Period  Weeks    Status  On-going    Target Date  01/29/18      PT LONG TERM GOAL #2   Title  Improve TUG score from 24.94 secs with SPC to </= 20 secs with SPC to demonstrate improved functional mobility.      Baseline  24.94 secs on 11-20-17    Time  4    Period  Weeks    Status  On-going    Target Date  01/29/18      PT LONG TERM GOAL #3   Title  Pt will increase gait velocity from 1.33 ft/sec with cane to >/= 1.6 ft/sec with cane for incr. gait efficiency.    Baseline  24.61 secs = 1.33 ft/sec with SPC on 11-20-17    Time  4    Period  Weeks    Status  On-going    Target Date  01/29/18      PT LONG TERM GOAL #4   Title  Amb. 350' with rollator on flat, even surface with SBA for increased community accessbility.      Time  4    Period  Weeks    Status  On-going    Target Date  01/29/18      PT LONG TERM GOAL #5   Title  Negotiate 4 steps with use of bil. hand rails using a step over step sequence.    Time  4    Period  Weeks    Status  On-going    Target Date  01/29/18      PT LONG TERM  GOAL #6   Title  Independent in HEP for balance & strengthening exs.    Time  4    Period  Weeks    Status  On-going    Target Date  01/29/18            Plan - 01/25/18 1334    Clinical Impression Statement  Pt very tearful and sad during today's session - no specific  reason for this temperament (pt appears to be depressed and feeling down today); pt's progress is limited due to age-related changes and safety concerns with ambulating with less assistive device other than RW.      Rehab Potential  Good    PT Frequency  2x / week    PT Duration  4 weeks    PT Treatment/Interventions  ADLs/Self Care Home Management;DME Instruction;Gait training;Stair training;Therapeutic activities;Therapeutic exercise;Balance training;Neuromuscular re-education;Patient/family education;Cryotherapy    PT Next Visit Plan  cont balance and strengthening - plan D/C next week    PT Home Exercise Plan  see above; balance HEP given on 12-04-17    Consulted and Agree with Plan of Care  Patient;Family member/caregiver    Family Member Consulted  son - Ed       Patient will benefit from skilled therapeutic intervention in order to improve the following deficits and impairments:  Abnormal gait, Decreased balance, Decreased strength, Impaired UE functional use, Postural dysfunction  Visit Diagnosis: Other abnormalities of gait and mobility  Muscle weakness (generalized)  Unsteadiness on feet     Problem List Patient Active Problem List   Diagnosis Date Noted  . Elevated troponin   . Asthma 08/15/2017  . Open left humeral fracture 08/15/2017  . Fracture 08/15/2017  . Pacemaker battery depletion 05/17/2017  . Leg cramps 06/12/2016  . Peripheral arterial disease ([place]) 12/08/2014  . Status post ascending aortic aneurysm repair/AVR -  Medtronic Freestyle root 09/28/2014  . CAD s/p CABG (LIMA to LAD) 07/08/2013  . History of aortic root repairand bioprosthetic AVR 07/08/2013  . Hyperlipidemia 07/08/2013  . SSS (sick sinus syndrome) ([place]) 07/08/2013  . Second degree AV block 07/08/2013  . Pacemaker, dual chamber St. Jude 2010 07/08/2013  . Essential hypertension 07/08/2013    Alda Lea, PT 01/25/2018, 1:39 PM  [facility] [address] [place], [place], [address] Phone: [phone]   Fax:  [phone]  Name: Holly Bolton MRN: [id number] Date of Birth: 1922/08/24

## 2018-01-29 ENCOUNTER — Ambulatory Visit: Payer: Medicare Other | Admitting: Physical Therapy

## 2018-01-29 DIAGNOSIS — R2689 Other abnormalities of gait and mobility: Secondary | ICD-10-CM | POA: Diagnosis not present

## 2018-01-29 DIAGNOSIS — R2681 Unsteadiness on feet: Secondary | ICD-10-CM | POA: Diagnosis not present

## 2018-01-29 DIAGNOSIS — M6281 Muscle weakness (generalized): Secondary | ICD-10-CM | POA: Diagnosis not present

## 2018-01-30 DIAGNOSIS — H43393 Other vitreous opacities, bilateral: Secondary | ICD-10-CM | POA: Diagnosis not present

## 2018-01-30 DIAGNOSIS — Z961 Presence of intraocular lens: Secondary | ICD-10-CM | POA: Diagnosis not present

## 2018-01-30 DIAGNOSIS — H401131 Primary open-angle glaucoma, bilateral, mild stage: Secondary | ICD-10-CM | POA: Diagnosis not present

## 2018-01-30 NOTE — Therapy (Signed)
[facility] [address] [place], [place], [address] Phone: [phone]   Fax:  [phone]  Physical Therapy Treatment/Discharge Summary  Patient Details  Name: Holly Bolton MRN: [id number] Date of Birth: 11-29-1922 Referring Provider (PT): Dr. Jani Gravel   Encounter Date: 01/29/2018  PT End of Session - 01/30/18 2028    Visit Number  10    Number of Visits  10    Authorization Type  Medicare & BCBS supplement     Authorization Time Period  11-20-17 - 02-12-18    PT Start Time  0934    PT Stop Time  1019    PT Time Calculation (min)  45 min    Equipment Utilized During Treatment  Gait belt       Past Medical History:  Diagnosis Date  . Aneurysm (arteriovenous) of coronary vessels    ascending aorta requiring bypass of the LAD  . Asthma   . Carotid stenosis 04/28/08   Doppler: <40% stenosis bilateral  . Hyperlipemia   . Hypertension   . Pacemaker generator end of life 11/18/08   Intermittent high-grade atrioventricular block  . Peripheral arterial disease (HCC)    left ABI of 0.78  . Presence of permanent cardiac pacemaker 08/26/01   Sinus node dysfunction-St.Jude  . Status post ascending aortic aneurysm repair/AVR -  Medtronic Freestyle root 09/28/2014   ascending aorta requiring bypass of the LAD     Past Surgical History:  Procedure Laterality Date  . ABDOMINAL HYSTERECTOMY  1966  . APPENDECTOMY  1943  . ASCENDING AORTIC ANEURYSM REPAIR  09/22/97   porcine aortic root  . BREAST FIBROADENOMA SURGERY  11/89  . CARDIAC SURGERY    . CORONARY ARTERY BYPASS GRAFT  09/22/97   LIMA to the LAD  . I&D EXTREMITY Left 08/15/2017   Procedure: IRRIGATION AND DEBRIDEMENT EXTREMITY;  Surgeon: Nicholes Stairs, MD;  Location: [facility];  Service: Orthopedics;  Laterality: Left;  . [place]   left  . NEPHRECTOMY  1958   right  . ORIF HUMERUS FRACTURE Left 08/15/2017   Procedure: OPEN REDUCTION INTERNAL  FIXATION (ORIF) PROXIMAL HUMERUS FRACTURE;  Surgeon: Nicholes Stairs, MD;  Location: [facility];  Service: Orthopedics;  Laterality: Left;  . OVARIAN CYST SURGERY  1948  . PACEMAKER GENERATOR CHANGE  11/18/08   St[employer]Jude  . PACEMAKER INSERTION  08/26/01   St.Jude  . PPM GENERATOR CHANGEOUT N/A 05/16/2017   Procedure: PPM GENERATOR CHANGEOUT;  Surgeon: Sanda Klein, MD;  Location: [facility];  Service: Cardiovascular;  Laterality: N/A;    There were no vitals filed for this visit.  Subjective Assessment - 01/30/18 2024    Subjective  Pt reports no new problems or changes - is ready to finish up PT    Patient is accompained by:  Family member   son Ed   Pertinent History  fall sustained on 08-13-17 - pt fractured Lt humerus; ORIF performed on 08-15-17:  CABG 09-22-97:  pacemaker 2003 with generator changeout 05-16-17:  PAD:  s/p ascending aortic aneurysm repair 09-28-14:  HTN    Patient Stated Goals  Be able to get around without fear of falling    Currently in Pain?  No/denies                       [facility] Adult PT Treatment/Exercise - 01/30/18 0001      Transfers   Transfers  Sit to  Stand    Sit to Stand  4: Min guard    Number of Reps  Other reps (comment)   5   Transfer Cueing  cues to lean forward      Standardized Balance Assessment   Standardized Balance Assessment  Timed Up and Go Test      Timed Up and Go Test   TUG  Normal TUG    Normal TUG (seconds)  27.58   with cane      Self Care - reviewed HEP and additional balance activities for son (Ed) to work with pt at home in order to improve  Standing balance and balance with sit to stand transfers   Balance Exercises - 01/30/18 2026      Balance Exercises: Standing   Step Over Hurdles / Cones  10 reps each leg with mod HHA and cues to shift weight forward    Other Standing Exercises  Pt performed ladder activity with mod hand held assist         PT Education - 01/30/18 2027    Education Details   educated pt and son in balance exercise - stepping over object with anterior weight shift to prevent LOB posteriorly    Person(s) Educated  Patient;Child(ren)    Methods  Explanation;Demonstration    Comprehension  Verbalized understanding;Returned demonstration          PT Long Term Goals - 01/30/18 2028      PT LONG TERM GOAL #1   Title  Improve Berg score from 26/56 to >/= 31/56 to reduce fall risk. NEW TARGET DATE FOR LTGS: 01/07/18, as pt missed two weeks of PT 2/2 illness    Baseline  26/56 on 11-20-17    Status  Not Met      PT LONG TERM GOAL #2   Title  Improve TUG score from 24.94 secs with SPC to </= 20 secs with SPC to demonstrate improved functional mobility.      Baseline  24.94 secs on 11-20-17; 23.85 secs with cane on 01-29-18    Status  Not Met      PT LONG TERM GOAL #3   Title  Pt will increase gait velocity from 1.33 ft/sec with cane to >/= 1.6 ft/sec with cane for incr. gait efficiency.    Baseline  24.61 secs = 1.33 ft/sec with SPC on 11-20-17; 27.58 secs = 1.19 ft/sec     Status  Not Met      PT LONG TERM GOAL #4   Title  Amb. 350' with rollator on flat, even surface with SBA for increased community accessbility.      Baseline  pt declines use of rollator - uses SPC - able to amb. 250' prior to fatigue    Status  Not Met      PT LONG TERM GOAL #5   Title  Negotiate 4 steps with use of bil. hand rails using a step over step sequence.    Baseline  met 01-29-18    Status  Achieved      PT LONG TERM GOAL #6   Title  Independent in HEP for balance & strengthening exs.    Baseline  met 01-29-18    Status  Achieved            Plan - 01/30/18 2034    Clinical Impression Statement  This 10th visit progress note/discharge summary covers dates 11-20-17 - 01-29-18:  pt has met LTG's #5 and 6:  LTG's #1-4 not met  due to continued balance and gait deficits and also due to pt's choice of using SPC rather than rollator or RW for assistance with ambulation.       Rehab Potential  Good    PT Frequency  2x / week    PT Duration  4 weeks    PT Treatment/Interventions  ADLs/Self Care Home Management;DME Instruction;Gait training;Stair training;Therapeutic activities;Therapeutic exercise;Balance training;Neuromuscular re-education;Patient/family education;Cryotherapy    PT Next Visit Plan  N/A - D/C    PT Home Exercise Plan  see above; balance HEP given on 12-04-17    Consulted and Agree with Plan of Care  Patient    Family Member Consulted  son - Ed       Patient will benefit from skilled therapeutic intervention in order to improve the following deficits and impairments:  Abnormal gait, Decreased balance, Decreased strength, Impaired UE functional use, Postural dysfunction  Visit Diagnosis: Other abnormalities of gait and mobility  Unsteadiness on feet     Problem List Patient Active Problem List   Diagnosis Date Noted  . Elevated troponin   . Asthma 08/15/2017  . Open left humeral fracture 08/15/2017  . Fracture 08/15/2017  . Pacemaker battery depletion 05/17/2017  . Leg cramps 06/12/2016  . Peripheral arterial disease ([facility]) 12/08/2014  . Status post ascending aortic aneurysm repair/AVR -  Medtronic Freestyle root 09/28/2014  . CAD s/p CABG (LIMA to LAD) 07/08/2013  . History of aortic root repairand bioprosthetic AVR 07/08/2013  . Hyperlipidemia 07/08/2013  . SSS (sick sinus syndrome) ([facility]) 07/08/2013  . Second degree AV block 07/08/2013  . Pacemaker, dual chamber [employer] Jude 2010 07/08/2013  . Essential hypertension 07/08/2013     PHYSICAL THERAPY DISCHARGE SUMMARY  Visits from Start of Care: 10  Current functional level related to goals / functional outcomes: See above for porgress towards goals   Remaining deficits: Cont decr. Standing balance and decreased safety with gait - pt chooses to use [facility] and declines recommendation for rollator or rolling walker at this time   Education / Equipment: Pt has been instructed in  balance HEP - son, Ed, assists with HEP for safety Plan: Patient agrees to discharge.  Patient goals were partially met. Patient is being discharged due to meeting the stated rehab goals.  ?????        Pt has maximized functional status at this time - plateau in functional progress with end of certification period.  Alda Lea, PT 01/30/2018, 8:38 PM  [facility] [address] [place], [place], [address] Phone: [phone]   Fax:  [phone]  Name: Holly Bolton MRN: [id number] Date of Birth: 08-18-1922

## 2018-01-31 ENCOUNTER — Ambulatory Visit: Payer: Medicare Other

## 2018-02-03 LAB — CUP PACEART REMOTE DEVICE CHECK
Battery Remaining Longevity: 115 mo
Battery Remaining Percentage: 95.5 %
Battery Voltage: 3.01 V
Brady Statistic AP VP Percent: 35 %
Brady Statistic AP VS Percent: 1.6 %
Brady Statistic AS VP Percent: 27 %
Brady Statistic AS VS Percent: 36 %
Brady Statistic RA Percent Paced: 37 %
Brady Statistic RV Percent Paced: 62 %
Date Time Interrogation Session: 20191021152729
Implantable Lead Implant Date: 20030714
Implantable Lead Implant Date: 20030714
Implantable Lead Location: 753859
Implantable Lead Location: 753860
Implantable Pulse Generator Implant Date: 20190403
Lead Channel Impedance Value: 340 Ohm
Lead Channel Impedance Value: 440 Ohm
Lead Channel Pacing Threshold Amplitude: 0.875 V
Lead Channel Pacing Threshold Amplitude: 1.25 V
Lead Channel Pacing Threshold Pulse Width: 0.5 ms
Lead Channel Pacing Threshold Pulse Width: 0.5 ms
Lead Channel Sensing Intrinsic Amplitude: 0.5 mV
Lead Channel Sensing Intrinsic Amplitude: 2.4 mV
Lead Channel Setting Pacing Amplitude: 1.5 V
Lead Channel Setting Pacing Amplitude: 1.875
Lead Channel Setting Pacing Pulse Width: 0.5 ms
Lead Channel Setting Sensing Sensitivity: 0.7 mV
Pulse Gen Model: 2272
Pulse Gen Serial Number: 9004253

## 2018-02-05 ENCOUNTER — Ambulatory Visit: Payer: Medicare Other

## 2018-02-07 ENCOUNTER — Encounter: Payer: Self-pay | Admitting: Internal Medicine

## 2018-02-07 ENCOUNTER — Ambulatory Visit: Payer: Medicare Other | Admitting: Internal Medicine

## 2018-02-11 ENCOUNTER — Ambulatory Visit (INDEPENDENT_AMBULATORY_CARE_PROVIDER_SITE_OTHER): Payer: Medicare Other | Admitting: Internal Medicine

## 2018-02-11 ENCOUNTER — Ambulatory Visit
Admission: RE | Admit: 2018-02-11 | Discharge: 2018-02-11 | Disposition: A | Discharge status: Home / Self Care | Payer: Medicare Other | Source: Ambulatory Visit | Attending: Internal Medicine | Admitting: Internal Medicine

## 2018-02-11 ENCOUNTER — Encounter: Payer: Self-pay | Admitting: Internal Medicine

## 2018-02-11 VITALS — BP 142/92 | HR 64 | Temp 97.5°F | Ht 59.0 in | Wt 94.0 lb

## 2018-02-11 DIAGNOSIS — I739 Peripheral vascular disease, unspecified: Secondary | ICD-10-CM | POA: Diagnosis not present

## 2018-02-11 DIAGNOSIS — I2510 Atherosclerotic heart disease of native coronary artery without angina pectoris: Secondary | ICD-10-CM | POA: Diagnosis not present

## 2018-02-11 DIAGNOSIS — R413 Other amnesia: Secondary | ICD-10-CM | POA: Diagnosis not present

## 2018-02-11 DIAGNOSIS — N183 Chronic kidney disease, stage 3 unspecified: Secondary | ICD-10-CM

## 2018-02-11 DIAGNOSIS — R062 Wheezing: Secondary | ICD-10-CM

## 2018-02-11 DIAGNOSIS — Z8639 Personal history of other endocrine, nutritional and metabolic disease: Secondary | ICD-10-CM | POA: Insufficient documentation

## 2018-02-11 DIAGNOSIS — R634 Abnormal weight loss: Secondary | ICD-10-CM | POA: Diagnosis not present

## 2018-02-11 DIAGNOSIS — R54 Age-related physical debility: Secondary | ICD-10-CM | POA: Insufficient documentation

## 2018-02-11 DIAGNOSIS — E538 Deficiency of other specified B group vitamins: Secondary | ICD-10-CM | POA: Diagnosis not present

## 2018-02-11 DIAGNOSIS — Z905 Acquired absence of kidney: Secondary | ICD-10-CM | POA: Diagnosis not present

## 2018-02-11 DIAGNOSIS — Z950 Presence of cardiac pacemaker: Secondary | ICD-10-CM

## 2018-02-11 DIAGNOSIS — Z9889 Other specified postprocedural states: Secondary | ICD-10-CM

## 2018-02-11 DIAGNOSIS — N3941 Urge incontinence: Secondary | ICD-10-CM | POA: Diagnosis not present

## 2018-02-11 DIAGNOSIS — R252 Cramp and spasm: Secondary | ICD-10-CM

## 2018-02-11 DIAGNOSIS — I495 Sick sinus syndrome: Secondary | ICD-10-CM

## 2018-02-11 DIAGNOSIS — J4520 Mild intermittent asthma, uncomplicated: Secondary | ICD-10-CM | POA: Diagnosis not present

## 2018-02-11 DIAGNOSIS — Z23 Encounter for immunization: Secondary | ICD-10-CM

## 2018-02-11 DIAGNOSIS — J90 Pleural effusion, not elsewhere classified: Secondary | ICD-10-CM | POA: Diagnosis not present

## 2018-02-11 DIAGNOSIS — Z8679 Personal history of other diseases of the circulatory system: Secondary | ICD-10-CM

## 2018-02-11 NOTE — Patient Instructions (Addendum)
I recommend you complete an advance directive--living will and health care power of attorney.    Increase your water intake to at least 6 cups per day.    Please go get an xray of your chest at [facility], [place]  I have checked your b12, blood counts, kidneys and thyroid today.  We will check your memory next visit.    I do recommend you get the shingles vaccine series even though you've had shingles before.  You can get it again.    Try to drink one boost shake per day to increase the protein in your diet since you are losing weight.

## 2018-02-11 NOTE — Progress Notes (Signed)
Provider:  Rexene Edison. Mariea Clonts, D.O., C.M.D. Location:   [facility]  Place of Service:   clinic  Previous PCP:  Dr. Jani Gravel  PPatient Care Team: Gayland Curry, DO as PCP - General (Geriatric Medicine)  Extended Emergency Contact Information Primary Emergency Contact: Werden,[name] Address: [place], [place] Phone: [phone] Mobile Phone: [phone] Relation: Son  Code Status: full code Goals of Care: Advanced Directive information Advanced Directives 02/11/2018  Does Patient Have a Medical Advance Directive? No  Type of Advance Directive -  Does patient want to make changes to medical advance directive? -  Would patient like information on creating a medical advance directive? No - Patient declined     Chief Complaint  Patient presents with  . Establish Care    New Patient    HPI: Patient is a 84 y.o. female seen today to establish with [facility].  Records have been requested from Dr. Jeneen Rinks Kim's office and received. I reviewed these.  Her new patient packet indicates a h/o htn, pacemaker, leg cramps, aortic valve replacement and CABG in August of 1999, asthma in the past, hand arthritis, chronic kidney disease and occasional hyperthyroidism.  She had recurrent pyelonephritis and had a kidney removed in 1958, later a kidney stone in 1959.      She has had two URIs in August, Sept.  They lasted a week and a half.  She's had trouble wheezing afterwards.  That's abated but not entirely resolved.  She's been getting PT since a fall in July.  7/1 was first fall--compound fx that required surgery of her left humerus--Dr. Stann Mainland.  Screws migrated to elbow.  Bone itself healed well. She had been mobile before her fall, but borderline per her son.  She initially was dependent in adls and had to stay in rehab for a couple weeks ([facility]), then 2 mos home rehab and 2 mos outpatient rehab.    She had a second fall in  August.    Has aortic valve replacement--1999.  She had her pacemaker "replaced" in the spring of this past year.  CKD3:  Creatinine runs a little high.  She has just one kidney--[facility] records indicate that she had the nephrectomy for recurrent pyelonephritis.    She does feel a little short of breath.  2-3 times per week, she gets to where she cannot breathe well and needs a neb tx.  She will feel tight when she wheezes.  Not coughing.  Not congested.  Had clear sputum initially with the cold.    Uses her cane at home and out.  Sometimes forgets it.  She is afraid to go without it.  She does a wet wipe bath and her son makes sure she showers using transfer bench at home.  Her son helps her with that.  Uses the restroom on her own.  A little incontinence every now and then.  Does wear a poise pad at night.  It has improved considerably--most nights are dry.  She is now waking to urinate.  No difficulty with bowel control.   She does get pain in her right leg.  That's been a problem for 15 years.  Was nuisance but progressed.  She saw neurology a couple of years ago and no nerve problem was noted.  She has a blockage in the leg--85% b/w distal and proximal per son.  This limits her mobility, too.  She's not back to where she was a year ago, but pretty close and light years ahead from where she was in the hospital.    [name] notices she gets discouraged.  He lives with her.    Seeing ok with glasses--needs them changed though by her report.  Wears them overall not just reading.  Dr. Rachael [facility]ee group follows her.  She did not need a new Rx when she went last week.  Has one eye drop for each eye for glaucoma--pressure was stable.  Hears well.    BP slightly elevated.  Gets vertigo attacks 2-3 times per year.  Her son gives her a dramamine, she lays down and it passes after a few hrs. Epley maneuver was tried at neuro rehab, but she cannot get her head all the way back.    Her thyroid has fluctuated up and  she was on medication at one point.  Then it trended down.  Iodine was recommended before that but it normalized.    Does c/o fatigue, weakness at times.    Memory:  She says it's not too bad.  [name] says she c/o immediate memory problems.  Her son writes the checks.  She will get confused at times.  She puts her pills out in a container.  She does not take them all together.    She struggles to swallow the CoQ10.  Oval pill like jellybean.  She does get leg cramps.  She does not drink much water.  She does not drink but 2 cups of water per day.  6-8 per day only.   She is to be on lasix twice a week, but has not taken it for two weeks.  Skipped b/c of concern that she will have an accident at therapy.   Down 9 lbs.  Appetite is ok.  She's never had much of one per her son.  Does usually eat three meals per day.  She gets a full breakfast and full dinner.  She is preparing her own lunch.  Soup and a 1/2 sandwich.  No difficulty swallowing food.    She had a prior hernia surgery and son was concerned due to recall, but he's unable to track down where she had the surgery.  He brought her there.  It was done at [facility] as day surgery.  Past Medical History:  Diagnosis Date  . Aneurysm (arteriovenous) of coronary vessels    ascending aorta requiring bypass of the LAD  . Asthma   . Carotid stenosis 04/28/08   Doppler: <40% stenosis bilateral  . Hyperlipemia   . Hypertension   . Pacemaker generator end of life 11/18/08   Intermittent high-grade atrioventricular block  . Peripheral arterial disease (HCC)    left ABI of 0.78  . Presence of permanent cardiac pacemaker 08/26/01   Sinus node dysfunction-[facility]  . Status post ascending aortic aneurysm repair/AVR -  Medtronic Freestyle root 09/28/2014   ascending aorta requiring bypass of the LAD    Past Surgical History:  Procedure Laterality Date  . ABDOMINAL HYSTERECTOMY  1966  . APPENDECTOMY  1943  . ASCENDING AORTIC ANEURYSM REPAIR  09/22/97    porcine aortic root  . BREAST FIBROADENOMA SURGERY  11/89  . CARDIAC SURGERY  09/22/1997   aortic valve root repair with porcine at time of CABG  . CORONARY ARTERY BYPASS GRAFT  09/22/97   LIMA to the LAD  . I&D EXTREMITY Left 08/15/2017   Procedure: IRRIGATION AND DEBRIDEMENT EXTREMITY;  Surgeon: Victorino December  Saralyn Pilar, MD;  Location: [facility];  Service: Orthopedics;  Laterality: Left;  . [place]   left  . NEPHRECTOMY  1958   right  . ORIF HUMERUS FRACTURE Left 08/15/2017   Procedure: OPEN REDUCTION INTERNAL FIXATION (ORIF) PROXIMAL HUMERUS FRACTURE;  Surgeon: Nicholes Stairs, MD;  Location: [facility];  Service: Orthopedics;  Laterality: Left;  . OVARIAN CYST SURGERY  1948  . PACEMAKER GENERATOR CHANGE  11/18/08   [facility]  . PACEMAKER INSERTION  08/26/01   [facility]  . PPM GENERATOR CHANGEOUT N/A 05/16/2017   Procedure: PPM GENERATOR CHANGEOUT;  Surgeon: Sanda Klein, MD;  Location: [facility];  Service: Cardiovascular;  Laterality: N/A;    Social History   Socioeconomic History  . Marital status: Widowed    Spouse name: Not on file  . Number of children: 1  . Years of education: 45  . Highest education level: High school graduate  Occupational History  . Occupation: Retired    Comment: was [employer]  . Financial resource strain: Not on file  . Food insecurity:    Worry: Not on file    Inability: Not on file  . Transportation needs:    Medical: Not on file    Non-medical: Not on file  Tobacco Use  . Smoking status: Never Smoker  . Smokeless tobacco: Never Used  Substance and Sexual Activity  . Alcohol use: No  . Drug use: No  . Sexual activity: Not Currently  Lifestyle  . Physical activity:    Days per week: Not on file    Minutes per session: Not on file  . Stress: Not on file  Relationships  . Social connections:    Talks on phone: Not on file    Gets together: Not on file    Attends religious service: Not on file    Active  member of club or organization: Not on file    Attends meetings of clubs or organizations: Not on file    Relationship status: Not on file  Other Topics Concern  . Not on file  Social History Narrative   Lives with son, Algis Greenhouse   Caffeine use: 1 cup coffee per day   Right handed   Exercises at home with theraband, stationary bike and walking    reports that she has never smoked. She has never used smokeless tobacco. She reports that she does not drink alcohol or use drugs.  Functional Status Survey: Is the patient deaf or have difficulty hearing?: Yes Does the patient have difficulty seeing, even when wearing glasses/contacts?: No(does have glaucoma, wears glasses) Does the patient have difficulty concentrating, remembering, or making decisions?: Yes Does the patient have difficulty walking or climbing stairs?: Yes Does the patient have difficulty dressing or bathing?: Yes(takes limited baths with son's assistance) Does the patient have difficulty doing errands alone such as visiting a doctor's office or shopping?: Yes(son brings her)  Family History  Problem Relation Age of Onset  . Heart attack Mother   . Heart attack Father   . Heart attack Brother   . Hyperlipidemia Sister   . Hypertension Sister     Health Maintenance  Topic Date Due  . DEXA SCAN  10/17/1989  . PNA vac Low Risk Adult (2 of 2 - PCV13) 02/19/2013  . TETANUS/TDAP  08/15/2027  . INFLUENZA VACCINE  Completed    Allergies  Allergen Reactions  . Accupril [Quinapril Hcl] Other (See Comments)    Unknown  reaction   . Amlodipine Swelling  . Benadryl [Diphenhydramine Hcl] Other (See Comments)    Unknown reaction   . Biaxin [Clarithromycin] Other (See Comments)    Unknown reaction   . Ciprofloxacin Other (See Comments)    Unknown reaction   . Codeine Other (See Comments)    Unknown reaction   . Diovan [Valsartan] Other (See Comments)    Unknown reaction   . Medrol [Methylprednisolone] Other (See  Comments)    Unknown  . Morphine And Related Other (See Comments)    Unknown reaction   . Neurontin [Gabapentin] Other (See Comments)    Unknown reaction   . Penicillins     Unknown reaction  Has patient had a PCN reaction causing immediate rash, facial/tongue/throat swelling, SOB or lightheadedness with hypotension: Unknown Has patient had a PCN reaction causing severe rash involving mucus membranes or skin necrosis: Unknown Has patient had a PCN reaction that required hospitalization: Unknown Has patient had a PCN reaction occurring within the last 10 years: No If all of the above answers are "NO", then may proceed with Cephalosporin use.     Outpatient Encounter Medications as of 02/11/2018  Medication Sig  . albuterol (ACCUNEB) 0.63 MG/3ML nebulizer solution Take 1 ampule by nebulization every 6 (six) hours as needed for wheezing.  Marland Kitchen aspirin 81 MG tablet Take 81 mg by mouth daily.   . brimonidine (ALPHAGAN) 0.2 % ophthalmic solution instill ONE DROP IN [facility] EYE TWICE DAILY  . carvedilol (COREG) 3.125 MG tablet Take 3.125 mg by mouth 2 (two) times daily with a meal.  . Coenzyme Q10 (COQ10) 200 MG CAPS Take 200 mg by mouth daily.  . furosemide (LASIX) 20 MG tablet Take 1 tablet (20 mg total) by mouth 2 (two) times a week. Mondays and Fridays  . olmesartan (BENICAR) 40 MG tablet Take 40 mg by mouth daily.  . vitamin B-12 (CYANOCOBALAMIN) 500 MCG tablet Take 500 mcg by mouth daily.  . [DISCONTINUED] vitamin B-12 (CYANOCOBALAMIN) 1000 MCG tablet Take 1,000 mcg by mouth daily.   . [DISCONTINUED] carvedilol (COREG) 3.125 MG tablet Take 3.125 mg by mouth 2 (two) times daily with a meal.   No facility-administered encounter medications on file as of 02/11/2018.     Review of Systems  Constitutional: Positive for malaise/fatigue and weight loss. Negative for chills, diaphoresis and fever.  HENT: Positive for nosebleeds.        Has a bridge  Eyes: Positive for blurred vision.        Glasses, glaucoma, feels like she needs new glasses, but just had eye exam  Respiratory: Positive for shortness of breath and wheezing. Negative for cough, hemoptysis and sputum production.   Cardiovascular: Positive for leg swelling. Negative for chest pain and palpitations.       Htn  Gastrointestinal: Positive for heartburn. Negative for abdominal pain, blood in stool, constipation, diarrhea, melena, nausea and vomiting.       Hiatal hernia, ok with ppi  Genitourinary: Negative for dysuria, frequency and urgency.  Musculoskeletal: Positive for falls. Negative for myalgias.       Deconditioning after recent illnesses; osteoporosis; poor balance  Skin: Negative for itching and rash.  Neurological: Positive for weakness. Negative for dizziness, tingling, sensory change, loss of consciousness and headaches.  Endo/Heme/Allergies: Negative for polydipsia. Bruises/bleeds easily.  Psychiatric/Behavioral: Positive for memory loss. Negative for depression and suicidal ideas. The patient is not nervous/anxious and does not have insomnia.     Vitals:   02/11/18 5809  BP: (!) 142/92  Pulse: 64  Temp: (!) 97.5 F (36.4 C)  TempSrc: Oral  SpO2: 94%  Weight: 94 lb (42.6 kg)  Height: 4\' 11"  (1.499 m)   Body mass index is 18.99 kg/m. Physical Exam Constitutional:      General: She is not in acute distress.    Appearance: Normal appearance. She is not ill-appearing or toxic-appearing.     Comments: Thin petite female, using cane to ambulate  HENT:     Head: Normocephalic and atraumatic.     Right Ear: Tympanic membrane, ear canal and external ear normal.     Left Ear: Tympanic membrane, ear canal and external ear normal.     Nose: Nose normal. No congestion.     Mouth/Throat:     Mouth: Mucous membranes are moist.     Pharynx: Oropharynx is clear. No oropharyngeal exudate.  Eyes:     Extraocular Movements: Extraocular movements intact.     Conjunctiva/sclera: Conjunctivae normal.      Pupils: Pupils are equal, round, and reactive to light.     Comments: glasses  Neck:     Musculoskeletal: Normal range of motion and neck supple.  Cardiovascular:     Rate and Rhythm: Normal rate and regular rhythm.     Pulses: Normal pulses.     Heart sounds: Murmur present. No friction rub. No gallop.   Pulmonary:     Effort: Pulmonary effort is normal. No respiratory distress.     Breath sounds: No wheezing or rales.     Comments: Poor aeration Abdominal:     General: Abdomen is flat. Bowel sounds are normal. There is no distension.     Palpations: Abdomen is soft. There is no mass.     Tenderness: There is no abdominal tenderness. There is no right CVA tenderness, left CVA tenderness, guarding or rebound.     Hernia: No hernia is present.  Musculoskeletal: Normal range of motion.        General: Deformity present. No swelling or tenderness.     Right lower leg: Edema present.     Left lower leg: Edema present.     Comments: Very minimal edema  Skin:    General: Skin is warm and dry.     Capillary Refill: Capillary refill takes less than 2 seconds.     Coloration: Skin is pale.  Neurological:     General: No focal deficit present.     Mental Status: She is alert and oriented to person, place, and time. Mental status is at baseline.     Cranial Nerves: No cranial nerve deficit.     Motor: Weakness present.     Coordination: Coordination normal.     Gait: Gait abnormal.     Comments: Walks with cane, kyphotic posture  Psychiatric:        Mood and Affect: Mood normal.        Behavior: Behavior normal.     Comments: Some mild short term memory loss, [name] helps with history     Labs reviewed: Basic Metabolic Panel: Recent Labs    08/20/17 0258 09/13/17 10/07/17 1430 02/11/18 1049  NA 140 141 142 141  K 4.3 4.7 4.6 4.5  CL 108  --  108 108  CO2 28  --  25 27  GLUCOSE 110*  --  124* 102  BUN 27* 43* 39* 25  CREATININE 1.30* 1.4* 1.33* 1.36*  CALCIUM 8.3*  --  9.3  9.2   Liver Function  Tests: Recent Labs    05/15/17 09/13/17 02/11/18 1049  AST 21 15 14   ALT 16 16 8   ALKPHOS 101 177*  --   BILITOT  --   --  0.7  PROT  --   --  6.4   No results for input(s): LIPASE, AMYLASE in the last 8760 hours. No results for input(s): AMMONIA in the last 8760 hours. CBC: Recent Labs    08/15/17 1535  08/20/17 0258 09/13/17 10/07/17 1430 02/11/18 1049  WBC 8.8   < > 8.6 5.4 9.4 5.4  NEUTROABS 6.9  --   --   --  7.4 3,175  HGB 9.9*   < > 9.3* 9.8* 9.5* 10.1*  HCT 31.3*   < > 28.7* 31* 29.2* 29.5*  MCV 100.6*   < > 95.7  --  98.3 96.4  PLT 210   < > 197 191 264 225   < > = values in this interval not displayed.   Cardiac Enzymes: Recent Labs    08/15/17 1923 08/16/17 0448 08/18/17 0640  TROPONINI 0.10* 0.09* 0.60*   BNP: Invalid input(s): POCBNP No results found for: HGBA1C Lab Results  Component Value Date   TSH 0.08 (L) 02/11/2018   Lab Results  Component Value Date   VITAMINB12 700 02/11/2018   No results found for: FOLATE No results found for: IRON, TIBC, FERRITIN  Imaging and Procedures noted on new patient packet: 9/19 CXR for pneumonia at [facility] 9/18 CT scan at [facility] 07/2008 Cataract surgery--Dr. Kathrin Penner, but follows with Dr. Bing Plume  Assessment/Plan 1. Mild intermittent asthma without complication -had h/o of this, but no problems with wheezing until recent pneumonia in the fall -will obtain CXR to evaluate  2. Coronary artery disease involving native coronary artery of native heart without angina pectoris -prior CABG 1999 with LIMA to LAD -on coreg and benicar and baby asa, tolerating well w/o dizziness  -bp satisfactory for her age and fall risk today from the geriatrics perspective  3. Peripheral arterial disease ([place]) -notable and may be actual etiology of her leg cramps, is quite frail at present so not a good candidate for vascular interventions, monitor, does follow with cardiology  4. SSS (sick sinus  syndrome) (HCC) -was reason for pacemaker  5. Status post ascending aortic aneurysm repair/AVR -  Medtronic Freestyle root -porcine AVR -clinically stable at present  6. Pacemaker, dual chamber [employer] Jude 2010 -followed by Dr. Loletha Grayer and team  7. Leg cramps -no active symptoms -struggling to swallow co q 10 and not on statin -ok to try to d/c coq10 and see if these recur -son has been unable to find an easier to swallow formulation  8. Memory loss, short term -notable, her son is very invested and supportive, will f/u mmse at next visit to evaluate further -likely vascular etiology due to findings on CT reviewed from 06/01/11 that showed "Remote infarct in the right frontal lobe is stable. Chronic microvascular ischemic changes and cerebral atrophy are stable.  Remote lacunar infarction in the right caudate head is stable."  9. H/O hyperthyroidism -will r/o this as cause of current weight loss scenario - TSH  10. Weight loss, unintentional -cause not clear outside of generalized frailty at 89 after complicated hospitalization and rehab stay--has had improvements though which is positive - CBC with Differential/Platelet - COMPLETE METABOLIC PANEL WITH GFR  11. Frailty syndrome in geriatric patient -has had decline since her pneumonia and humeral fx--f/u cxr and get updated labs - CBC with  Differential/Platelet - COMPLETE METABOLIC PANEL WITH GFR - TSH  12. Urge incontinence of urine -ongoing concern at times, regular trips to the bathroom may help prevent outright incontinence -followed by [facility]  13. Chronic kidney disease, stage 3 (HCC) Avoid nephrotoxic agents like nsaids, dose adjust renally excreted meds, hydrate. - COMPLETE METABOLIC PANEL WITH GFR  14. S/p nephrectomy -for recurrent pyelo - CBC with Differential/Platelet - COMPLETE METABOLIC PANEL WITH GFR  15. B12 deficiency - has ho deficiency so f/u lab - Vitamin B12  16. Need for vaccination with 13-polyvalent  pneumococcal conjugate vaccine -prevnar given today  17. Wheezing - ongoing since time of pneumonia in the fall, f/u cxr - DG Chest 2 View; Future  Labs/tests ordered:   Orders Placed This Encounter  Procedures  . DG Chest 2 View    Standing Status:   Future    Number of Occurrences:   1    Standing Expiration Date:   04/13/2019    Order Specific Question:   Reason for Exam (SYMPTOM  OR DIAGNOSIS REQUIRED)    Answer:   wheezing since URI    Order Specific Question:   Preferred imaging location?    Answer:   [facility]  . CBC with Differential/Platelet  . COMPLETE METABOLIC PANEL WITH GFR  . TSH  . Vitamin B12  . Vitamin B12  . T4, free  . T3, free  . TEST AUTHORIZATION     Shaelyn Decarli L. Rushil Kimbrell, D.O. [facility] [address], [place] [address] Cell Phone (Mon-Fri 8am-5pm):  [phone] On Call:  [phone] & follow prompts after 5pm & weekends Office Phone:  [phone] Office Fax:  [phone]

## 2018-02-12 ENCOUNTER — Other Ambulatory Visit: Payer: Self-pay | Admitting: Internal Medicine

## 2018-02-12 DIAGNOSIS — J90 Pleural effusion, not elsewhere classified: Secondary | ICD-10-CM

## 2018-02-12 LAB — TEST AUTHORIZATION

## 2018-02-12 LAB — CBC WITH DIFFERENTIAL/PLATELET
Absolute Monocytes: 913 cells/uL (ref 200–950)
Basophils Absolute: 38 cells/uL (ref 0–200)
Basophils Relative: 0.7 %
Eosinophils Absolute: 259 cells/uL (ref 15–500)
Eosinophils Relative: 4.8 %
HCT: 29.5 % — ABNORMAL LOW (ref 35.0–45.0)
Hemoglobin: 10.1 g/dL — ABNORMAL LOW (ref 11.7–15.5)
Lymphs Abs: 1015 cells/uL (ref 850–3900)
MCH: 33 pg (ref 27.0–33.0)
MCHC: 34.2 g/dL (ref 32.0–36.0)
MCV: 96.4 fL (ref 80.0–100.0)
MPV: 11.6 fL (ref 7.5–12.5)
Monocytes Relative: 16.9 %
Neutro Abs: 3175 cells/uL (ref 1500–7800)
Neutrophils Relative %: 58.8 %
Platelets: 225 10*3/uL (ref 140–400)
RBC: 3.06 10*6/uL — ABNORMAL LOW (ref 3.80–5.10)
RDW: 14.2 % (ref 11.0–15.0)
Total Lymphocyte: 18.8 %
WBC: 5.4 10*3/uL (ref 3.8–10.8)

## 2018-02-12 LAB — COMPLETE METABOLIC PANEL WITH GFR
AG Ratio: 1.3 (calc) (ref 1.0–2.5)
ALT: 8 U/L (ref 6–29)
AST: 14 U/L (ref 10–35)
Albumin: 3.6 g/dL (ref 3.6–5.1)
Alkaline phosphatase (APISO): 102 U/L (ref 33–130)
BUN/Creatinine Ratio: 18 (calc) (ref 6–22)
BUN: 25 mg/dL (ref 7–25)
CO2: 27 mmol/L (ref 20–32)
Calcium: 9.2 mg/dL (ref 8.6–10.4)
Chloride: 108 mmol/L (ref 98–110)
Creat: 1.36 mg/dL — ABNORMAL HIGH (ref 0.60–0.88)
GFR, Est African American: 39 mL/min/{1.73_m2} — ABNORMAL LOW (ref >=60)
GFR, Est Non African American: 33 mL/min/{1.73_m2} — ABNORMAL LOW (ref >=60)
Globulin: 2.8 g/dL (calc) (ref 1.9–3.7)
Glucose, Bld: 102 mg/dL (ref 65–139)
Potassium: 4.5 mmol/L (ref 3.5–5.3)
Sodium: 141 mmol/L (ref 135–146)
Total Bilirubin: 0.7 mg/dL (ref 0.2–1.2)
Total Protein: 6.4 g/dL (ref 6.1–8.1)

## 2018-02-12 LAB — T4, FREE: Free T4: 1.4 ng/dL (ref 0.8–1.8)

## 2018-02-12 LAB — VITAMIN B12: Vitamin B-12: 700 pg/mL (ref 200–1100)

## 2018-02-12 LAB — TSH: TSH: 0.08 mIU/L — ABNORMAL LOW (ref 0.40–4.50)

## 2018-02-12 LAB — T3, FREE: T3, Free: 2.7 pg/mL (ref 2.3–4.2)

## 2018-02-12 NOTE — Progress Notes (Signed)
CT ordered to evaluate effusion.

## 2018-02-13 DIAGNOSIS — E039 Hypothyroidism, unspecified: Secondary | ICD-10-CM

## 2018-02-13 HISTORY — DX: Hypothyroidism, unspecified: E03.9

## 2018-02-14 ENCOUNTER — Encounter: Payer: BLUE CROSS/BLUE SHIELD | Admitting: Internal Medicine

## 2018-02-14 ENCOUNTER — Ambulatory Visit: Payer: BLUE CROSS/BLUE SHIELD | Admitting: Internal Medicine

## 2018-02-15 ENCOUNTER — Encounter: Payer: Self-pay | Admitting: Internal Medicine

## 2018-03-04 ENCOUNTER — Ambulatory Visit (INDEPENDENT_AMBULATORY_CARE_PROVIDER_SITE_OTHER): Payer: Medicare Other

## 2018-03-04 DIAGNOSIS — I495 Sick sinus syndrome: Secondary | ICD-10-CM | POA: Diagnosis not present

## 2018-03-05 LAB — CUP PACEART REMOTE DEVICE CHECK
Battery Remaining Longevity: 112 mo
Battery Remaining Percentage: 95.5 %
Battery Voltage: 3.01 V
Brady Statistic AP VP Percent: 33 %
Brady Statistic AP VS Percent: 2.1 %
Brady Statistic AS VP Percent: 28 %
Brady Statistic AS VS Percent: 36 %
Brady Statistic RA Percent Paced: 35 %
Brady Statistic RV Percent Paced: 61 %
Date Time Interrogation Session: 20200121070014
Implantable Lead Implant Date: 20030714
Implantable Lead Implant Date: 20030714
Implantable Lead Location: 753859
Implantable Lead Location: 753860
Implantable Pulse Generator Implant Date: 20190403
Lead Channel Impedance Value: 350 Ohm
Lead Channel Impedance Value: 440 Ohm
Lead Channel Pacing Threshold Amplitude: 0.75 V
Lead Channel Pacing Threshold Amplitude: 0.875 V
Lead Channel Pacing Threshold Pulse Width: 0.5 ms
Lead Channel Pacing Threshold Pulse Width: 0.5 ms
Lead Channel Sensing Intrinsic Amplitude: 0.5 mV
Lead Channel Sensing Intrinsic Amplitude: 2.7 mV
Lead Channel Setting Pacing Amplitude: 1 V
Lead Channel Setting Pacing Amplitude: 1.875
Lead Channel Setting Pacing Pulse Width: 0.5 ms
Lead Channel Setting Sensing Sensitivity: 0.7 mV
Pulse Gen Model: 2272
Pulse Gen Serial Number: 9004253

## 2018-03-05 NOTE — Progress Notes (Signed)
Remote pacemaker transmission.   

## 2018-03-20 ENCOUNTER — Encounter: Payer: Self-pay | Admitting: Cardiovascular Disease

## 2018-03-20 ENCOUNTER — Ambulatory Visit (INDEPENDENT_AMBULATORY_CARE_PROVIDER_SITE_OTHER): Payer: Medicare Other | Admitting: Cardiovascular Disease

## 2018-03-20 VITALS — BP 123/60 | HR 80 | Ht 59.0 in | Wt 94.4 lb

## 2018-03-20 DIAGNOSIS — Z950 Presence of cardiac pacemaker: Secondary | ICD-10-CM | POA: Diagnosis not present

## 2018-03-20 DIAGNOSIS — I495 Sick sinus syndrome: Secondary | ICD-10-CM | POA: Diagnosis not present

## 2018-03-20 DIAGNOSIS — I2510 Atherosclerotic heart disease of native coronary artery without angina pectoris: Secondary | ICD-10-CM | POA: Diagnosis not present

## 2018-03-20 DIAGNOSIS — Z8679 Personal history of other diseases of the circulatory system: Secondary | ICD-10-CM | POA: Diagnosis not present

## 2018-03-20 DIAGNOSIS — I739 Peripheral vascular disease, unspecified: Secondary | ICD-10-CM | POA: Diagnosis not present

## 2018-03-20 DIAGNOSIS — Z9889 Other specified postprocedural states: Secondary | ICD-10-CM | POA: Diagnosis not present

## 2018-03-20 DIAGNOSIS — E0590 Thyrotoxicosis, unspecified without thyrotoxic crisis or storm: Secondary | ICD-10-CM | POA: Insufficient documentation

## 2018-03-20 DIAGNOSIS — I441 Atrioventricular block, second degree: Secondary | ICD-10-CM

## 2018-03-20 DIAGNOSIS — I10 Essential (primary) hypertension: Secondary | ICD-10-CM

## 2018-03-20 NOTE — Progress Notes (Signed)
Patient ID: Holly Bolton, female   DOB: [birth year], 85 y.o.   MRN: [id number]    Cardiology Office Note    Date:  03/20/2018   ID:  Holly Bolton, DOB May 26, 1922, MRN [id number]  PCP:  Gayland Curry, DO  Cardiologist:   Sanda Klein, MD   Chief Complaint  Patient presents with  . Pacemaker Problem    History of Present Illness:  Holly Bolton is a 85 y.o. female for follow up for PAD, CAD status post CABG, s/p AVR & ascending aortic aneurysm repair, pacemaker for SSS and 2nd deg AV block.  She had a pacemaker generator change out in April 2019.  About 3 months later, she had a mechanical fall complicated by a complex left humerus fracture.  She has a surgical repair with placement of a plate.  Some of the hardware dislodgment she has some loose readings in her left arm.  She has lost a lot of weight and has a BMI of 19.  She has a persistently suppressed TSH but has been reluctant to restart antithyroid medications.  The patient specifically denies any chest pain at rest exertion, dyspnea at rest or with exertion, orthopnea, paroxysmal nocturnal dyspnea, syncope, palpitations, focal neurological deficits, intermittent claudication, lower extremity edema, unexplained weight gain, cough, hemoptysis or wheezing.  She does not have clear-cut heat intolerance and she claims that she eats well.  Pacemaker function is normal.  Her current generator has approximately 9 years of estimated longevity.  Her sensing parameters have slowly deteriorated since implantation but are still acceptable.  She has 35% atrial pacing and 61% ventricular pacing.  She has a very long AV conduction 60 ms.  She has infrequent episodes of very brief paroxysmal atrial tachycardia lasting 10-20 seconds each time.  There is no true atrial fibrillation.  She underwent aortic root replacement with a Medtronic Freestyle root, with coronary reimplantation and a single vessel LIMA to LAD bypass in 1999 (for bicuspid valve with  severe stenosis and ascending aortic aneurysm). She received a dual-chamber permanent pacemaker for sinus node dysfunction and intermittent high-grade AV block. She had a pacemaker generator change in 2010 ([address] Jude accent). She has hyperlipidemia but prefers not to take statin medication.  Most recent echo July 2019 with normal bioprosthesis function with an LVEF 50 to 55%.  Past Medical History:  Diagnosis Date  . Aneurysm (arteriovenous) of coronary vessels    ascending aorta requiring bypass of the LAD  . Asthma   . Carotid stenosis 04/28/08   Doppler: <40% stenosis bilateral  . Hyperlipemia   . Hypertension   . Pacemaker generator end of life 11/18/08   Intermittent high-grade atrioventricular block  . Peripheral arterial disease (H[facility])    left ABI of 0.78  . Presence of permanent cardiac pacemaker 08/26/01   Sinus node dysfunction-[facility]  . Status post ascending aortic aneurysm repair/AVR -  Medtronic Freestyle root 09/28/2014   ascending aorta requiring bypass of the LAD     Past Surgical History:  Procedure Laterality Date  . ABDOMINAL HYSTERECTOMY  1966  . APPENDECTOMY  1943  . ASCENDING AORTIC ANEURYSM REPAIR  09/22/97   porcine aortic root  . BREAST FIBROADENOMA SURGERY  11/89  . CARDIAC SURGERY  09/22/1997   aortic valve root repair with porcine at time of CABG  . CORONARY ARTERY BYPASS GRAFT  09/22/97   LIMA to the LAD  . I&D EXTREMITY Left 08/15/2017   Procedure: IRRIGATION AND DEBRIDEMENT EXTREMITY;  Surgeon: Stann Mainland,  Elly Modena, MD;  Location: [facility];  Service: Orthopedics;  Laterality: Left;  . [place]   left  . NEPHRECTOMY  1958   right  . ORIF HUMERUS FRACTURE Left 08/15/2017   Procedure: OPEN REDUCTION INTERNAL FIXATION (ORIF) PROXIMAL HUMERUS FRACTURE;  Surgeon: Nicholes Stairs, MD;  Location: [facility];  Service: Orthopedics;  Laterality: Left;  . OVARIAN CYST SURGERY  1948  . PACEMAKER GENERATOR CHANGE  11/18/08   [facility]  . PACEMAKER  INSERTION  08/26/01   [facility]  . PPM GENERATOR CHANGEOUT N/A 05/16/2017   Procedure: PPM GENERATOR CHANGEOUT;  Surgeon: Sanda Klein, MD;  Location: [facility];  Service: Cardiovascular;  Laterality: N/A;    Current Medications: Outpatient Medications Prior to Visit  Medication Sig Dispense Refill  . albuterol (ACCUNEB) 0.63 MG/3ML nebulizer solution Take 1 ampule by nebulization every 6 (six) hours as needed for wheezing.    Marland Kitchen aspirin 81 MG tablet Take 81 mg by mouth daily.     . brimonidine (ALPHAGAN) 0.2 % ophthalmic solution instill ONE DROP IN EACH EYE TWICE DAILY  2  . carvedilol (COREG) 3.125 MG tablet Take 3.125 mg by mouth 2 (two) times daily with a meal.    . Coenzyme Q10 (COQ10) 200 MG CAPS Take 200 mg by mouth daily.    . furosemide (LASIX) 20 MG tablet Take 1 tablet (20 mg total) by mouth 2 (two) times a week. Mondays and Fridays 30 tablet 3  . olmesartan (BENICAR) 40 MG tablet Take 40 mg by mouth daily.    . vitamin B-12 (CYANOCOBALAMIN) 500 MCG tablet Take 500 mcg by mouth daily.     No facility-administered medications prior to visit.      Allergies:   Accupril [quinapril hcl]; Amlodipine; Benadryl [diphenhydramine hcl]; Biaxin [clarithromycin]; Ciprofloxacin; Codeine; Diovan [valsartan]; Medrol [methylprednisolone]; Morphine and related; Neurontin [gabapentin]; and Penicillins   Social History   Socioeconomic History  . Marital status: Widowed    Spouse name: Not on file  . Number of children: 1  . Years of education: 21  . Highest education level: High school graduate  Occupational History  . Occupation: Retired    Comment: was [employer]  . Financial resource strain: Not on file  . Food insecurity:    Worry: Not on file    Inability: Not on file  . Transportation needs:    Medical: Not on file    Non-medical: Not on file  Tobacco Use  . Smoking status: Never Smoker  . Smokeless tobacco: Never Used  Substance and Sexual Activity  .  Alcohol use: No  . Drug use: No  . Sexual activity: Not Currently  Lifestyle  . Physical activity:    Days per week: Not on file    Minutes per session: Not on file  . Stress: Not on file  Relationships  . Social connections:    Talks on phone: Not on file    Gets together: Not on file    Attends religious service: Not on file    Active member of club or organization: Not on file    Attends meetings of clubs or organizations: Not on file    Relationship status: Not on file  Other Topics Concern  . Not on file  Social History Narrative   Lives with son, Algis Greenhouse   Caffeine use: 1 cup coffee per day   Right handed   Exercises at home with theraband, stationary bike and  walking     Family History:  The patient's family history includes Heart attack in her brother, father, and mother; Hyperlipidemia in her sister; Hypertension in her sister.   ROS:   Please see the history of present illness.    ROS All other systems reviewed and are negative.   PHYSICAL EXAM:   VS:  BP 123/60   Pulse 80   Ht 4\' 11"  (1.499 m)   Wt 94 lb 6.4 oz (42.8 kg)   BMI 19.07 kg/m     General: Alert, oriented x3, no distress,  very slender, borderline cachectic.  Well-healed left subclavian pacemaker site.well-healed sternotomy scar  Head: no evidence of trauma, PERRL, EOMI, no exophtalmos or lid lag, no myxedema, no xanthelasma; normal ears, nose and oropharynx Neck: normal jugular venous pulsations and no hepatojugular reflux; brisk carotid pulses without delay and no carotid bruits Chest: clear to auscultation, no signs of consolidation by percussion or palpation, normal fremitus, symmetrical and full respiratory excursions Cardiovascular: normal position and quality of the apical impulse, regular rhythm, normal first and second heart sounds, 2/6 early peaking systolic ejection murmur, no diastolic murmurs, rubs or gallops Abdomen: no tenderness or distention, no masses by palpation, no abnormal  pulsatility or arterial bruits, normal bowel sounds, no hepatosplenomegaly Extremities: no clubbing, cyanosis or edema; 2+ radial, ulnar and brachial pulses bilaterally; 2+ right femoral, posterior tibial and dorsalis pedis pulses; 2+ left femoral, posterior tibial and dorsalis pedis pulses; no subclavian or femoral bruits Neurological: grossly nonfocal Psych: Normal mood and affect   Wt Readings from Last 3 Encounters:  03/20/18 94 lb 6.4 oz (42.8 kg)  02/11/18 94 lb (42.6 kg)  09/05/17 96 lb (43.5 kg)      Studies/Labs Reviewed:   EKG:  EKG is not ordered today.    ECHO 08/16/2017 - Left ventricle: The cavity size was normal. Wall thickness was   increased in a pattern of moderate LVH. Systolic function was   normal. The estimated ejection fraction was in the range of 50%   to 55%. The study was not technically sufficient to allow   evaluation of LV diastolic dysfunction due to atrial   fibrillation. - Aortic valve: Moderately calcified annulus. Trileaflet;   moderately thickened leaflets. Valve area (VTI): 2.53 cm^2.  Peak gradient 9 mmHg, mean gradient 5 mmHg - Mitral valve: Mildly calcified annulus. Mildly thickened leaflets   . There was mild to moderate regurgitation. - Left atrium: The atrium was severely dilated. - Right ventricle: The cavity size was mildly dilated. - Right atrium: The atrium was moderately dilated. - Tricuspid valve: There was moderate regurgitation. - Pulmonary arteries: Systolic pressure was moderately increased.   PA peak pressure: 58 mm Hg (S). - Technically difficult study.   Lipid Panel  Total cholesterol 170, HDL 63, LDL 99, triglycerides 57 Hemoglobin 10.1, creatinine 2.0, potassium 4.7  ASSESSMENT:    1. Second degree AV block   2. SSS (sick sinus syndrome) ([place])   3. Pacemaker, dual chamber [employer] 2003/2010/2019   4. Essential hypertension   5. Coronary artery disease involving native coronary artery of native heart without  angina pectoris   6. Status post ascending aortic aneurysm repair/AVR -  Medtronic Freestyle root   7. Peripheral arterial disease ([place])   8. Hyperthyroidism      PLAN:  In order of problems listed above:   1. 2nd deg AV block: in spite of the high percentage of ventricular pacing, without evidence of congestive heart failure.  She has evidence of infrahisian disease with bifascicular block 2. SSS:  The percentage of atrial pacing has decreased and this might be a sign of hyperthyroidism. 3. PPM: Normal device function.  Has some relatively low sensing on both the atrial and the ventricular channel (leads from 2003), but there is no evidence of this is interfering with normal device function so far. 4. HTN: Targeting a blood pressure of systolic under 329, due to orthostatic hypotension.  When she first checked and her blood pressure was actually found 168/68 but within a few minutes it has dropped to 123/60.. 5. CAD: She denies angina pectoris.  She had coronary reimplantation of the time of aortic valve/aortic root surgery, but has never really had significant coronary obstruction that I am aware of.  She declines to take a statin. 6. S/P Asc Ao repair and bioAVR (Medtronic Freestyle): Echo August 16, 2017 with normal left ventricular systolic function and normal aortic valve bioprosthesis parameters 7. PAD: Asymptomatic, but sedentary. 8. Hyperthyroidism: She previously had to take antithyroid medications but did not want to be on them long-term.  She has had substantial weight loss.  I am concerned about the potential cardiac effects of persistent toxicosis.  Recommend that she restart antithyroid medications.  She plans to discuss this with PCP.  Most recent TSH was 0.08 and her TSH has been low for several sequential assays.    Medication Adjustments/Labs and Tests Ordered: Current medicines are reviewed at length with the patient today.  Concerns regarding medicines are outlined above.   Medication changes, Labs and Tests ordered today are listed in the Patient Instructions below. Patient Instructions  Medication Instructions:  Continue same medications If you need a refill on your cardiac medications before your next appointment, please call your pharmacy.   Lab work: None ordered   Testing/Procedures: None ordered  Follow-Up: At [facility], you and your health needs are our priority.  As part of our continuing mission to provide you with exceptional heart care, we have created designated Provider Care Teams.  These Care Teams include your primary Cardiologist (physician) and Advanced Practice Providers (APPs -  Physician Assistants and Nurse Practitioners) who all work together to provide you with the care you need, when you need it.  . Follow Up appointment with Dr.Williard Keller  12 months  Call 3 months before to schedule      Signed, Sanda Klein, MD  03/20/2018 4:09 PM    [facility] [address], [place], [place]  [address] Phone: [phone]; Fax: [phone]

## 2018-03-20 NOTE — Patient Instructions (Addendum)
Medication Instructions:  Continue same medications If you need a refill on your cardiac medications before your next appointment, please call your pharmacy.   Lab work: None ordered   Testing/Procedures: None ordered  Follow-Up: At [facility], you and your health needs are our priority.  As part of our continuing mission to provide you with exceptional heart care, we have created designated Provider Care Teams.  These Care Teams include your primary Cardiologist (physician) and Advanced Practice Providers (APPs -  Physician Assistants and Nurse Practitioners) who all work together to provide you with the care you need, when you need it.  . Follow Up appointment with Dr.Croitoru  12 months  Call 3 months before to schedule

## 2018-03-26 LAB — CUP PACEART INCLINIC DEVICE CHECK
Date Time Interrogation Session: 20200211110759
Implantable Lead Implant Date: 20030714
Implantable Lead Implant Date: 20030714
Implantable Lead Location: 753859
Implantable Lead Location: 753860
Implantable Pulse Generator Implant Date: 20190403
Pulse Gen Model: 2272
Pulse Gen Serial Number: 9004253

## 2018-03-28 ENCOUNTER — Encounter: Payer: Self-pay | Admitting: Internal Medicine

## 2018-03-28 ENCOUNTER — Ambulatory Visit (INDEPENDENT_AMBULATORY_CARE_PROVIDER_SITE_OTHER): Payer: Medicare Other | Admitting: Internal Medicine

## 2018-03-28 VITALS — BP 122/60 | HR 77 | Temp 97.5°F | Ht 59.0 in | Wt 94.0 lb

## 2018-03-28 DIAGNOSIS — M21611 Bunion of right foot: Secondary | ICD-10-CM

## 2018-03-28 DIAGNOSIS — G3184 Mild cognitive impairment, so stated: Secondary | ICD-10-CM

## 2018-03-28 DIAGNOSIS — R54 Age-related physical debility: Secondary | ICD-10-CM

## 2018-03-28 DIAGNOSIS — I495 Sick sinus syndrome: Secondary | ICD-10-CM

## 2018-03-28 DIAGNOSIS — M21612 Bunion of left foot: Secondary | ICD-10-CM | POA: Diagnosis not present

## 2018-03-28 DIAGNOSIS — I2510 Atherosclerotic heart disease of native coronary artery without angina pectoris: Secondary | ICD-10-CM

## 2018-03-28 DIAGNOSIS — N183 Chronic kidney disease, stage 3 unspecified: Secondary | ICD-10-CM

## 2018-03-28 DIAGNOSIS — R413 Other amnesia: Secondary | ICD-10-CM

## 2018-03-28 DIAGNOSIS — J4520 Mild intermittent asthma, uncomplicated: Secondary | ICD-10-CM | POA: Diagnosis not present

## 2018-03-28 DIAGNOSIS — Z23 Encounter for immunization: Secondary | ICD-10-CM

## 2018-03-28 DIAGNOSIS — E0590 Thyrotoxicosis, unspecified without thyrotoxic crisis or storm: Secondary | ICD-10-CM | POA: Diagnosis not present

## 2018-03-28 HISTORY — DX: Mild cognitive impairment of uncertain or unknown etiology: G31.84

## 2018-03-28 MED ORDER — METHIMAZOLE 5 MG PO TABS: 5.0000 mg | ORAL_TABLET | Freq: Three times a day (TID) | ORAL | 3 refills | Status: DC

## 2018-03-28 MED ORDER — CARVEDILOL 3.125 MG PO TABS: 3.1250 mg | ORAL_TABLET | Freq: Two times a day (BID) | ORAL | 3 refills | Status: DC

## 2018-03-28 NOTE — Progress Notes (Signed)
Location:  [facility] clinic Provider:  Lilias Lorensen L. Mariea Clonts, D.O., C.M.D.  Goals of Care:  Advanced Directives 02/11/2018  Does Patient Have a Medical Advance Directive? No  Type of Advance Directive -  Does patient want to make changes to medical advance directive? -  Would patient like information on creating a medical advance directive? No - Patient declined     Chief Complaint  Patient presents with  . Medical Management of Chronic Issues    6 weeks follow-up    HPI: Patient is a 85 y.o. female seen today for medical management of chronic diseases/6 week f/u.    BP is high.  She is cold.  BP improved to normal range at recheck at end of visit.  Says she is so so.    Her TSH has been running low several times.  Dr. Loletha Grayer was worried it would throw her into afib. She had been on methimazole.    Wheezing has cleared up.  Has not needed an albuterol neb in at least a month.  She is doing her exercises at home.  She says her son is the Veterinary surgeon.  Her son is having her put one foot in front of the other.  One side is much more stable than the other.  He discussed it with neuro and felt to be due to weakness of hips.  Using therabands and doing squats at sink.  She is still afraid of falling.  Says they happen when you least expect it.    She is able to dress herself and take a sponge bath and make her bed, simple meals.  She cannot do a shower on her own.  She will get confused about stuff in the immediate vicinity.  Does ok with remembering things from a week ago or in the future.  We did not do mmse at her son's request b/c he thought she'd grow upset about it and not want to keep on going with her exercises if she learned her memory was significantly bad.  Has a 3-in-1 commode in one bathroom.  Has not had formal home safety evaluation.    Left pleural effusion was seen on xray.  I recommended a CT but patient is not wanting any workup of this--she's tired of being tested and put thru  exams.    She has been c/o pain in her big toes.  she gets some edema in her feet and takes her lasix.  There is no redness.    Past Medical History:  Diagnosis Date  . Aneurysm (arteriovenous) of coronary vessels    ascending aorta requiring bypass of the LAD  . Asthma   . Carotid stenosis 04/28/08   Doppler: <40% stenosis bilateral  . Hyperlipemia   . Hypertension   . Pacemaker generator end of life 11/18/08   Intermittent high-grade atrioventricular block  . Peripheral arterial disease (HCC)    left ABI of 0.78  . Presence of permanent cardiac pacemaker 08/26/01   Sinus node dysfunction-[facility]  . Status post ascending aortic aneurysm repair/AVR -  Medtronic Freestyle root 09/28/2014   ascending aorta requiring bypass of the LAD     Past Surgical History:  Procedure Laterality Date  . ABDOMINAL HYSTERECTOMY  1966  . APPENDECTOMY  1943  . ASCENDING AORTIC ANEURYSM REPAIR  09/22/97   porcine aortic root  . BREAST FIBROADENOMA SURGERY  11/89  . CARDIAC SURGERY  09/22/1997   aortic valve root repair with porcine at time of CABG  .  CORONARY ARTERY BYPASS GRAFT  09/22/97   LIMA to the LAD  . I&D EXTREMITY Left 08/15/2017   Procedure: IRRIGATION AND DEBRIDEMENT EXTREMITY;  Surgeon: Nicholes Stairs, MD;  Location: [place];  Service: Orthopedics;  Laterality: Left;  . [place]   left  . NEPHRECTOMY  1958   right  . ORIF HUMERUS FRACTURE Left 08/15/2017   Procedure: OPEN REDUCTION INTERNAL FIXATION (ORIF) PROXIMAL HUMERUS FRACTURE;  Surgeon: Nicholes Stairs, MD;  Location: [place];  Service: Orthopedics;  Laterality: Left;  . OVARIAN CYST SURGERY  1948  . PACEMAKER GENERATOR CHANGE  11/18/08   [place]  . PACEMAKER INSERTION  08/26/01   [place]  . PPM GENERATOR CHANGEOUT N/A 05/16/2017   Procedure: PPM GENERATOR CHANGEOUT;  Surgeon: Sanda Klein, MD;  Location: [place] [facility];  Service: Cardiovascular;  Laterality: N/A;    Allergies  Allergen Reactions    . Accupril [Quinapril Hcl] Other (See Comments)    Unknown reaction   . Amlodipine Swelling  . Benadryl [Diphenhydramine Hcl] Other (See Comments)    Unknown reaction   . Biaxin [Clarithromycin] Other (See Comments)    Unknown reaction   . Ciprofloxacin Other (See Comments)    Unknown reaction   . Codeine Other (See Comments)    Unknown reaction   . Diovan [Valsartan] Other (See Comments)    Unknown reaction   . Medrol [Methylprednisolone] Other (See Comments)    Unknown  . Morphine And Related Other (See Comments)    Unknown reaction   . Neurontin [Gabapentin] Other (See Comments)    Unknown reaction   . Penicillins     Unknown reaction  Has patient had a PCN reaction causing immediate rash, facial/tongue/throat swelling, SOB or lightheadedness with hypotension: Unknown Has patient had a PCN reaction causing severe rash involving mucus membranes or skin necrosis: Unknown Has patient had a PCN reaction that required hospitalization: Unknown Has patient had a PCN reaction occurring within the last 10 years: No If all of the above answers are "NO", then may proceed with Cephalosporin use.     Outpatient Encounter Medications as of 03/28/2018  Medication Sig  . albuterol (ACCUNEB) 0.63 MG/3ML nebulizer solution Take 1 ampule by nebulization every 6 (six) hours as needed for wheezing.  Marland Kitchen aspirin 81 MG tablet Take 81 mg by mouth daily.   . brimonidine (ALPHAGAN) 0.2 % ophthalmic solution instill ONE DROP IN [facility] EYE TWICE DAILY  . carvedilol (COREG) 3.125 MG tablet Take 3.125 mg by mouth 2 (two) times daily with a meal.  . Coenzyme Q10 (COQ10) 200 MG CAPS Take 200 mg by mouth daily.  . furosemide (LASIX) 20 MG tablet Take 1 tablet (20 mg total) by mouth 2 (two) times a week. Mondays and Fridays  . olmesartan (BENICAR) 40 MG tablet Take 40 mg by mouth daily.  . vitamin B-12 (CYANOCOBALAMIN) 500 MCG tablet Take 500 mcg by mouth daily.   No facility-administered encounter medications  on file as of 03/28/2018.     Review of Systems:  Review of Systems  Constitutional: Negative for chills, fever, malaise/fatigue and weight loss.       Wt stable over 6 wks  HENT: Negative for congestion.   Eyes: Negative for blurred vision.       Glasses  Respiratory: Negative for cough and shortness of breath.   Cardiovascular: Negative for chest pain, palpitations and leg swelling.  Gastrointestinal: Negative for abdominal pain, blood in stool, constipation, diarrhea and melena.  Genitourinary: Positive for urgency.       Uses pad--incontinence at times when takes lasix  Musculoskeletal: Negative for back pain, falls and joint pain.  Skin: Negative for itching and rash.  Neurological: Negative for dizziness and loss of consciousness.  Endo/Heme/Allergies: Bruises/bleeds easily.  Psychiatric/Behavioral: Positive for memory loss. Negative for depression. The patient is not nervous/anxious.     Health Maintenance  Topic Date Due  . DEXA SCAN  10/17/1989  . PNA vac Low Risk Adult (2 of 2 - PCV13) 02/19/2013  . TETANUS/TDAP  08/15/2027  . INFLUENZA VACCINE  Completed    Physical Exam: Vitals:   03/28/18 1136  BP: (!) 160/68  Pulse: 77  Temp: (!) 97.5 F (36.4 C)  TempSrc: Oral  SpO2: 98%  Weight: 94 lb (42.6 kg)  Height: 4\' 11"  (1.499 m)   Body mass index is 18.99 kg/m. Physical Exam Vitals signs reviewed.  Constitutional:      General: She is not in acute distress.    Appearance: Normal appearance. She is not ill-appearing or toxic-appearing.  HENT:     Head: Normocephalic and atraumatic.  Neck:     Musculoskeletal: Neck supple.  Cardiovascular:     Rate and Rhythm: Normal rate and regular rhythm.     Pulses: Normal pulses.     Heart sounds: Murmur present.  Pulmonary:     Effort: Pulmonary effort is normal.     Comments: Breath sounds diminished left base, clear otherwise Abdominal:     General: Bowel sounds are normal. There is no distension.      Palpations: Abdomen is soft.     Tenderness: There is no abdominal tenderness.  Musculoskeletal: Normal range of motion.     Right lower leg: Edema present.     Left lower leg: Edema present.     Comments: Ambulates with cane; bilateral feet with severe great toe bunions with erythema on MTPs and tips of toes, right greater than left; great toenails also thickened right greater than left--using tissue b/w toes to keep them separate and from going on top of one another; edema nonpitting  Skin:    General: Skin is warm and dry.     Capillary Refill: Capillary refill takes less than 2 seconds.  Neurological:     General: No focal deficit present.     Mental Status: She is alert and oriented to person, place, and time. Mental status is at baseline.     Gait: Gait abnormal.  Psychiatric:        Mood and Affect: Mood normal.        Behavior: Behavior normal.     Labs reviewed: Basic Metabolic Panel: Recent Labs    08/20/17 0258 09/13/17 10/07/17 1430 02/11/18 1049  NA 140 141 142 141  K 4.3 4.7 4.6 4.5  CL 108  --  108 108  CO2 28  --  25 27  GLUCOSE 110*  --  124* 102  BUN 27* 43* 39* 25  CREATININE 1.30* 1.4* 1.33* 1.36*  CALCIUM 8.3*  --  9.3 9.2  TSH  --  0.07*  --  0.08*   Liver Function Tests: Recent Labs    05/15/17 09/13/17 02/11/18 1049  AST 21 15 14   ALT 16 16 8   ALKPHOS 101 177*  --   BILITOT  --   --  0.7  PROT  --   --  6.4   No results for input(s): LIPASE, AMYLASE in the last 8760 hours. No results  for input(s): AMMONIA in the last 8760 hours. CBC: Recent Labs    08/15/17 1535  08/20/17 0258 09/13/17 10/07/17 1430 02/11/18 1049  WBC 8.8   < > 8.6 5.4 9.4 5.4  NEUTROABS 6.9  --   --   --  7.4 3,175  HGB 9.9*   < > 9.3* 9.8* 9.5* 10.1*  HCT 31.3*   < > 28.7* 31* 29.2* 29.5*  MCV 100.6*   < > 95.7  --  98.3 96.4  PLT 210   < > 197 191 264 225   < > = values in this interval not displayed.   Lipid Panel: Recent Labs    09/13/17  CHOL 153  HDL 49   LDLCALC 92  TRIG 62   No results found for: HGBA1C  Procedures since last visit: No results found.  Assessment/Plan 1. Memory loss, short term -mild cognitive impairment, possibly early dementia, but her son did not want mmse done today (wrote me a letter about his concerns)  - will ask ST from home health to help with assessing her from the cognitive perspective at home due to some difficulties with remembering things in her surroundings and to work with OT on home safety and ADLs also--sounds like some grab bars may be needed - Ambulatory referral to [place]  2. Frailty syndrome in geriatric patient - ongoing, cont cane for support, her son is definitely invested in her care--continue his support -will get home health out to ensure safety - Ambulatory referral to [place]  3. Mild intermittent asthma without complication -doing better w/o wheezing at this time -still sounds like she has her left pleural effusion--we opted not to pursue a CT chest today as pt is tired of tests and wants to be left alone  4. Hyperthyroidism - tsh has been running low so will restart treatment in hopes that her weight will stabilize with this--recheck tsh at next visit - methimazole (TAPAZOLE) 5 MG tablet; Take 1 tablet (5 mg total) by mouth 3 (three) times daily.  Dispense: 90 tablet; Refill: 3  5. Chronic kidney disease, stage 3 (HCC) -Avoid nephrotoxic agents like nsaids, dose adjust renally excreted meds, hydrate. -pt has only one kidney due to prior nephrectomy  6. Bilateral bunions -needs better fitting shoes and place to have toenails trimmed--son reports it's challenging as great toenails are thick - Ambulatory referral to Podiatry  7. Coronary artery disease involving native coronary artery of native heart without angina pectoris - s/p cabg -cont secondary prevention with current regimen and continue to follow with Dr. Loletha Grayer - carvedilol (COREG) 3.125 MG tablet; Take 1 tablet (3.125  mg total) by mouth 2 (two) times daily with a meal.  Dispense: 60 tablet; Refill: 3  8. SSS (sick sinus syndrome) (HCC) -s/p pacer, just had checkup - carvedilol (COREG) 3.125 MG tablet; Take 1 tablet (3.125 mg total) by mouth 2 (two) times daily with a meal.  Dispense: 60 tablet; Refill: 3  9. Need for vaccination with 13-polyvalent pneumococcal conjugate vaccine - Pneumococcal conjugate vaccine 13-valent given--appears she never had this per records we have  Labs/tests ordered:   Orders Placed This Encounter  Procedures  . Pneumococcal conjugate vaccine 13-valent  . Ambulatory referral to Home Health    Referral Priority:   Routine    Referral Type:   Home Health Care    Referral Reason:   Specialty Services Required    Requested Specialty:   Tea    Number of  Visits Requested:   1  . Ambulatory referral to Podiatry    Referral Priority:   Routine    Referral Type:   Consultation    Referral Reason:   Specialty Services Required    Requested Specialty:   Podiatry    Number of Visits Requested:   1   Next appt:  06/27/2018  Jaislyn Blinn L. Desjuan Stearns, D.O. [facility] [address], [place] [address] Cell Phone (Mon-Fri 8am-5pm):  [phone] On Call:  [phone] & follow prompts after 5pm & weekends Office Phone:  [phone] Office Fax:  [phone]

## 2018-03-28 NOTE — Patient Instructions (Signed)
Please restart methimazole 5mg  daily for your hyperthyroidism.  I suspect this may help you maintain your weight better.    I will ask home health to come out to help with your home safety.  I've referred you to podiatry about your bunions and thickened toenails.

## 2018-03-29 ENCOUNTER — Encounter: Payer: Self-pay | Admitting: Internal Medicine

## 2018-04-02 DIAGNOSIS — N183 Chronic kidney disease, stage 3 (moderate): Secondary | ICD-10-CM | POA: Diagnosis not present

## 2018-04-02 DIAGNOSIS — I2510 Atherosclerotic heart disease of native coronary artery without angina pectoris: Secondary | ICD-10-CM | POA: Diagnosis not present

## 2018-04-02 DIAGNOSIS — I6523 Occlusion and stenosis of bilateral carotid arteries: Secondary | ICD-10-CM | POA: Diagnosis not present

## 2018-04-02 DIAGNOSIS — Z953 Presence of xenogenic heart valve: Secondary | ICD-10-CM | POA: Diagnosis not present

## 2018-04-02 DIAGNOSIS — I739 Peripheral vascular disease, unspecified: Secondary | ICD-10-CM

## 2018-04-02 DIAGNOSIS — Z951 Presence of aortocoronary bypass graft: Secondary | ICD-10-CM | POA: Diagnosis not present

## 2018-04-02 DIAGNOSIS — I495 Sick sinus syndrome: Secondary | ICD-10-CM | POA: Diagnosis not present

## 2018-04-02 DIAGNOSIS — G3184 Mild cognitive impairment, so stated: Secondary | ICD-10-CM | POA: Diagnosis not present

## 2018-04-02 DIAGNOSIS — R54 Age-related physical debility: Secondary | ICD-10-CM | POA: Diagnosis not present

## 2018-04-02 DIAGNOSIS — E039 Hypothyroidism, unspecified: Secondary | ICD-10-CM | POA: Diagnosis not present

## 2018-04-02 DIAGNOSIS — J4520 Mild intermittent asthma, uncomplicated: Secondary | ICD-10-CM

## 2018-04-02 DIAGNOSIS — Z950 Presence of cardiac pacemaker: Secondary | ICD-10-CM | POA: Diagnosis not present

## 2018-04-02 DIAGNOSIS — I129 Hypertensive chronic kidney disease with stage 1 through stage 4 chronic kidney disease, or unspecified chronic kidney disease: Secondary | ICD-10-CM | POA: Diagnosis not present

## 2018-04-04 DIAGNOSIS — G3184 Mild cognitive impairment, so stated: Secondary | ICD-10-CM | POA: Diagnosis not present

## 2018-04-04 DIAGNOSIS — I6523 Occlusion and stenosis of bilateral carotid arteries: Secondary | ICD-10-CM | POA: Diagnosis not present

## 2018-04-04 DIAGNOSIS — I129 Hypertensive chronic kidney disease with stage 1 through stage 4 chronic kidney disease, or unspecified chronic kidney disease: Secondary | ICD-10-CM | POA: Diagnosis not present

## 2018-04-04 DIAGNOSIS — R54 Age-related physical debility: Secondary | ICD-10-CM | POA: Diagnosis not present

## 2018-04-04 DIAGNOSIS — N183 Chronic kidney disease, stage 3 (moderate): Secondary | ICD-10-CM | POA: Diagnosis not present

## 2018-04-04 DIAGNOSIS — I2510 Atherosclerotic heart disease of native coronary artery without angina pectoris: Secondary | ICD-10-CM | POA: Diagnosis not present

## 2018-04-08 DIAGNOSIS — I6523 Occlusion and stenosis of bilateral carotid arteries: Secondary | ICD-10-CM | POA: Diagnosis not present

## 2018-04-08 DIAGNOSIS — G3184 Mild cognitive impairment, so stated: Secondary | ICD-10-CM | POA: Diagnosis not present

## 2018-04-08 DIAGNOSIS — I129 Hypertensive chronic kidney disease with stage 1 through stage 4 chronic kidney disease, or unspecified chronic kidney disease: Secondary | ICD-10-CM | POA: Diagnosis not present

## 2018-04-08 DIAGNOSIS — R54 Age-related physical debility: Secondary | ICD-10-CM | POA: Diagnosis not present

## 2018-04-08 DIAGNOSIS — N183 Chronic kidney disease, stage 3 (moderate): Secondary | ICD-10-CM | POA: Diagnosis not present

## 2018-04-08 DIAGNOSIS — I2510 Atherosclerotic heart disease of native coronary artery without angina pectoris: Secondary | ICD-10-CM | POA: Diagnosis not present

## 2018-04-10 DIAGNOSIS — N183 Chronic kidney disease, stage 3 (moderate): Secondary | ICD-10-CM | POA: Diagnosis not present

## 2018-04-10 DIAGNOSIS — G3184 Mild cognitive impairment, so stated: Secondary | ICD-10-CM | POA: Diagnosis not present

## 2018-04-10 DIAGNOSIS — I6523 Occlusion and stenosis of bilateral carotid arteries: Secondary | ICD-10-CM | POA: Diagnosis not present

## 2018-04-10 DIAGNOSIS — R54 Age-related physical debility: Secondary | ICD-10-CM | POA: Diagnosis not present

## 2018-04-10 DIAGNOSIS — I2510 Atherosclerotic heart disease of native coronary artery without angina pectoris: Secondary | ICD-10-CM | POA: Diagnosis not present

## 2018-04-10 DIAGNOSIS — I129 Hypertensive chronic kidney disease with stage 1 through stage 4 chronic kidney disease, or unspecified chronic kidney disease: Secondary | ICD-10-CM | POA: Diagnosis not present

## 2018-04-12 DIAGNOSIS — N183 Chronic kidney disease, stage 3 (moderate): Secondary | ICD-10-CM | POA: Diagnosis not present

## 2018-04-12 DIAGNOSIS — I6523 Occlusion and stenosis of bilateral carotid arteries: Secondary | ICD-10-CM | POA: Diagnosis not present

## 2018-04-12 DIAGNOSIS — R54 Age-related physical debility: Secondary | ICD-10-CM | POA: Diagnosis not present

## 2018-04-12 DIAGNOSIS — G3184 Mild cognitive impairment, so stated: Secondary | ICD-10-CM | POA: Diagnosis not present

## 2018-04-12 DIAGNOSIS — I2510 Atherosclerotic heart disease of native coronary artery without angina pectoris: Secondary | ICD-10-CM | POA: Diagnosis not present

## 2018-04-12 DIAGNOSIS — I129 Hypertensive chronic kidney disease with stage 1 through stage 4 chronic kidney disease, or unspecified chronic kidney disease: Secondary | ICD-10-CM | POA: Diagnosis not present

## 2018-04-15 DIAGNOSIS — R54 Age-related physical debility: Secondary | ICD-10-CM | POA: Diagnosis not present

## 2018-04-15 DIAGNOSIS — I6523 Occlusion and stenosis of bilateral carotid arteries: Secondary | ICD-10-CM | POA: Diagnosis not present

## 2018-04-15 DIAGNOSIS — N183 Chronic kidney disease, stage 3 (moderate): Secondary | ICD-10-CM | POA: Diagnosis not present

## 2018-04-15 DIAGNOSIS — I2510 Atherosclerotic heart disease of native coronary artery without angina pectoris: Secondary | ICD-10-CM | POA: Diagnosis not present

## 2018-04-15 DIAGNOSIS — G3184 Mild cognitive impairment, so stated: Secondary | ICD-10-CM | POA: Diagnosis not present

## 2018-04-15 DIAGNOSIS — I129 Hypertensive chronic kidney disease with stage 1 through stage 4 chronic kidney disease, or unspecified chronic kidney disease: Secondary | ICD-10-CM | POA: Diagnosis not present

## 2018-04-16 ENCOUNTER — Ambulatory Visit (INDEPENDENT_AMBULATORY_CARE_PROVIDER_SITE_OTHER): Payer: Medicare Other | Admitting: Sports Medicine

## 2018-04-16 ENCOUNTER — Encounter: Payer: Self-pay | Admitting: Sports Medicine

## 2018-04-16 VITALS — BP 168/80 | HR 76 | Resp 16

## 2018-04-16 DIAGNOSIS — I739 Peripheral vascular disease, unspecified: Secondary | ICD-10-CM

## 2018-04-16 DIAGNOSIS — M79675 Pain in left toe(s): Secondary | ICD-10-CM

## 2018-04-16 DIAGNOSIS — M79674 Pain in right toe(s): Secondary | ICD-10-CM

## 2018-04-16 DIAGNOSIS — I2510 Atherosclerotic heart disease of native coronary artery without angina pectoris: Secondary | ICD-10-CM

## 2018-04-16 DIAGNOSIS — B351 Tinea unguium: Secondary | ICD-10-CM | POA: Diagnosis not present

## 2018-04-16 NOTE — Progress Notes (Signed)
Subjective: Holly Bolton is a 85 y.o. female patient seen today in office with complaint of mildly painful thickened and elongated toenails; unable to trim and pain to toes at night. Patient denies history of Diabetes, Neuropathy, or Vascular disease. Patient has no other pedal complaints at this time.   Patient is assisted by son at this visit.  Review of Systems  All other systems reviewed and are negative.    Patient Active Problem List   Diagnosis Date Noted  . Bilateral bunions 03/28/2018  . Hyperthyroidism 03/20/2018  . Weight loss, unintentional 02/11/2018  . H/O hyperthyroidism 02/11/2018  . Memory loss, short term 02/11/2018  . Frailty syndrome in geriatric patient 02/11/2018  . Urge incontinence of urine 02/11/2018  . Chronic kidney disease, stage 3 ([place]) 02/11/2018  . S/p nephrectomy 02/11/2018  . B12 deficiency 02/11/2018  . Elevated troponin   . Asthma 08/15/2017  . Open left humeral fracture 08/15/2017  . Fracture 08/15/2017  . Pacemaker battery depletion 05/17/2017  . Leg cramps 06/12/2016  . Peripheral arterial disease ([place]) 12/08/2014  . Status post ascending aortic aneurysm repair/AVR -  Medtronic Freestyle root 09/28/2014  . CAD s/p CABG (LIMA to LAD) 07/08/2013  . History of aortic root repairand bioprosthetic AVR 07/08/2013  . Hyperlipidemia 07/08/2013  . SSS (sick sinus syndrome) ([place]) 07/08/2013  . Second degree AV block 07/08/2013  . Pacemaker, dual chamber [employer] 2003/2010/2019 07/08/2013  . Essential hypertension 07/08/2013    Current Outpatient Medications on File Prior to Visit  Medication Sig Dispense Refill  . albuterol (ACCUNEB) 0.63 MG/3ML nebulizer solution Take 1 ampule by nebulization every 6 (six) hours as needed for wheezing.    Marland Kitchen aspirin 81 MG tablet Take 81 mg by mouth daily.     . brimonidine (ALPHAGAN) 0.2 % ophthalmic solution instill ONE DROP IN EACH EYE TWICE DAILY  2  . carvedilol (COREG) 3.125 MG tablet Take 1 tablet (3.125  mg total) by mouth 2 (two) times daily with a meal. 60 tablet 3  . Coenzyme Q10 (COQ10) 200 MG CAPS Take 200 mg by mouth daily.    . furosemide (LASIX) 20 MG tablet Take 1 tablet (20 mg total) by mouth 2 (two) times a week. Mondays and Fridays 30 tablet 3  . methimazole (TAPAZOLE) 5 MG tablet Take 1 tablet (5 mg total) by mouth 3 (three) times daily. 90 tablet 3  . olmesartan (BENICAR) 40 MG tablet Take 40 mg by mouth daily.    . vitamin B-12 (CYANOCOBALAMIN) 500 MCG tablet Take 500 mcg by mouth daily.     No current facility-administered medications on file prior to visit.     Allergies  Allergen Reactions  . Accupril [Quinapril Hcl] Other (See Comments)    Unknown reaction   . Amlodipine Swelling  . Benadryl [Diphenhydramine Hcl] Other (See Comments)    Unknown reaction   . Biaxin [Clarithromycin] Other (See Comments)    Unknown reaction   . Ciprofloxacin Other (See Comments)    Unknown reaction   . Codeine Other (See Comments)    Unknown reaction   . Diovan [Valsartan] Other (See Comments)    Unknown reaction   . Medrol [Methylprednisolone] Other (See Comments)    Unknown  . Morphine And Related Other (See Comments)    Unknown reaction   . Neurontin [Gabapentin] Other (See Comments)    Unknown reaction   . Penicillins     Unknown reaction  Has patient had a PCN reaction causing immediate rash,  facial/tongue/throat swelling, SOB or lightheadedness with hypotension: Unknown Has patient had a PCN reaction causing severe rash involving mucus membranes or skin necrosis: Unknown Has patient had a PCN reaction that required hospitalization: Unknown Has patient had a PCN reaction occurring within the last 10 years: No If all of the above answers are "NO", then may proceed with Cephalosporin use.     Objective: Physical Exam  General: Well developed, nourished, no acute distress, awake, alert and oriented x 3  Vascular: Dorsalis pedis artery 1/4 bilateral, Posterior tibial  artery 1/4 bilateral, skin temperature warm to cooler proximal to distal bilateral lower extremities, moderate varicosities, no pedal hair present bilateral.  Neurological: Gross sensation present via light touch bilateral.   Dermatological: Skin is cool, dry, and supple bilateral, Nails 1-10 are tender, long, thick, and discolored with mild subungal debris, no webspace macerations present bilateral, no open lesions present bilateral, no callus/corns/hyperkeratotic tissue present bilateral. No signs of infection bilateral.  Musculoskeletal: Asymptomatic grade 4 bunion boney deformities and hammertoe noted bilateral. Muscular strength within normal limits without painon range of motion. No pain with calf compression bilateral.  Assessment and Plan:  Problem List Items Addressed This Visit    None    Visit Diagnoses    Pain due to onychomycosis of toenails of both feet    -  Primary   PVD (peripheral vascular disease) ([place])          -Examined patient.  -Discussed treatment options for painful mycotic nails. -ABN signed -Mechanically debrided and reduced mycotic nails with sterile nail nipper and dremel nail file without incident. -Recommend topical pain cream or using lidocaine patches that she has at home as needed for toe pain especially when sleeping at night -Recommend during the day to use toe caps to protect the toes and to avoid shoes that are narrow or that rub her toes especially both big toes where severe bunion deformity is noted -Patient to return in 3 months for follow up evaluation or sooner if symptoms worsen.  Landis Martins, DPM

## 2018-04-18 DIAGNOSIS — I2510 Atherosclerotic heart disease of native coronary artery without angina pectoris: Secondary | ICD-10-CM | POA: Diagnosis not present

## 2018-04-18 DIAGNOSIS — I6523 Occlusion and stenosis of bilateral carotid arteries: Secondary | ICD-10-CM | POA: Diagnosis not present

## 2018-04-18 DIAGNOSIS — I129 Hypertensive chronic kidney disease with stage 1 through stage 4 chronic kidney disease, or unspecified chronic kidney disease: Secondary | ICD-10-CM | POA: Diagnosis not present

## 2018-04-18 DIAGNOSIS — G3184 Mild cognitive impairment, so stated: Secondary | ICD-10-CM | POA: Diagnosis not present

## 2018-04-18 DIAGNOSIS — R54 Age-related physical debility: Secondary | ICD-10-CM | POA: Diagnosis not present

## 2018-04-18 DIAGNOSIS — N183 Chronic kidney disease, stage 3 (moderate): Secondary | ICD-10-CM | POA: Diagnosis not present

## 2018-04-26 DIAGNOSIS — I129 Hypertensive chronic kidney disease with stage 1 through stage 4 chronic kidney disease, or unspecified chronic kidney disease: Secondary | ICD-10-CM | POA: Diagnosis not present

## 2018-04-26 DIAGNOSIS — R54 Age-related physical debility: Secondary | ICD-10-CM | POA: Diagnosis not present

## 2018-04-26 DIAGNOSIS — I2510 Atherosclerotic heart disease of native coronary artery without angina pectoris: Secondary | ICD-10-CM | POA: Diagnosis not present

## 2018-04-26 DIAGNOSIS — G3184 Mild cognitive impairment, so stated: Secondary | ICD-10-CM | POA: Diagnosis not present

## 2018-04-26 DIAGNOSIS — I6523 Occlusion and stenosis of bilateral carotid arteries: Secondary | ICD-10-CM | POA: Diagnosis not present

## 2018-04-26 DIAGNOSIS — N183 Chronic kidney disease, stage 3 (moderate): Secondary | ICD-10-CM | POA: Diagnosis not present

## 2018-04-30 DIAGNOSIS — I2510 Atherosclerotic heart disease of native coronary artery without angina pectoris: Secondary | ICD-10-CM | POA: Diagnosis not present

## 2018-04-30 DIAGNOSIS — G3184 Mild cognitive impairment, so stated: Secondary | ICD-10-CM | POA: Diagnosis not present

## 2018-04-30 DIAGNOSIS — R54 Age-related physical debility: Secondary | ICD-10-CM | POA: Diagnosis not present

## 2018-04-30 DIAGNOSIS — I129 Hypertensive chronic kidney disease with stage 1 through stage 4 chronic kidney disease, or unspecified chronic kidney disease: Secondary | ICD-10-CM | POA: Diagnosis not present

## 2018-04-30 DIAGNOSIS — I6523 Occlusion and stenosis of bilateral carotid arteries: Secondary | ICD-10-CM | POA: Diagnosis not present

## 2018-04-30 DIAGNOSIS — N183 Chronic kidney disease, stage 3 (moderate): Secondary | ICD-10-CM | POA: Diagnosis not present

## 2018-05-29 ENCOUNTER — Other Ambulatory Visit: Payer: Self-pay | Admitting: *Deleted

## 2018-05-29 MED ORDER — OLMESARTAN MEDOXOMIL 40 MG PO TABS: 40.0000 mg | ORAL_TABLET | Freq: Every day | ORAL | 1 refills | Status: DC

## 2018-05-29 NOTE — Telephone Encounter (Signed)
Received fax refill Request from [facility].  Pended Rx and sent to Dr. Mariea Clonts for approval due to [place] Warning.

## 2018-06-03 ENCOUNTER — Ambulatory Visit (INDEPENDENT_AMBULATORY_CARE_PROVIDER_SITE_OTHER): Payer: Medicare Other | Admitting: *Deleted

## 2018-06-03 ENCOUNTER — Other Ambulatory Visit: Payer: Self-pay

## 2018-06-03 DIAGNOSIS — I495 Sick sinus syndrome: Secondary | ICD-10-CM

## 2018-06-04 LAB — CUP PACEART REMOTE DEVICE CHECK
Battery Remaining Longevity: 109 mo
Battery Remaining Percentage: 95.5 %
Battery Voltage: 3.01 V
Brady Statistic AP VP Percent: 37 %
Brady Statistic AP VS Percent: 1 %
Brady Statistic AS VP Percent: 38 %
Brady Statistic AS VS Percent: 23 %
Brady Statistic RA Percent Paced: 38 %
Brady Statistic RV Percent Paced: 76 %
Date Time Interrogation Session: 20200421060030
Implantable Lead Implant Date: 20030714
Implantable Lead Implant Date: 20030714
Implantable Lead Location: 753859
Implantable Lead Location: 753860
Implantable Pulse Generator Implant Date: 20190403
Lead Channel Impedance Value: 350 Ohm
Lead Channel Impedance Value: 450 Ohm
Lead Channel Pacing Threshold Amplitude: 0.625 V
Lead Channel Pacing Threshold Amplitude: 0.75 V
Lead Channel Pacing Threshold Pulse Width: 0.5 ms
Lead Channel Pacing Threshold Pulse Width: 0.5 ms
Lead Channel Sensing Intrinsic Amplitude: 0.5 mV
Lead Channel Sensing Intrinsic Amplitude: 3.1 mV
Lead Channel Setting Pacing Amplitude: 1 V
Lead Channel Setting Pacing Amplitude: 1.625
Lead Channel Setting Pacing Pulse Width: 0.5 ms
Lead Channel Setting Sensing Sensitivity: 0.7 mV
Pulse Gen Model: 2272
Pulse Gen Serial Number: 9004253

## 2018-06-10 ENCOUNTER — Encounter: Payer: Self-pay | Admitting: Cardiology

## 2018-06-10 NOTE — Progress Notes (Signed)
Remote pacemaker transmission.   

## 2018-06-27 ENCOUNTER — Other Ambulatory Visit: Payer: Self-pay

## 2018-06-27 ENCOUNTER — Encounter: Payer: Self-pay | Admitting: Internal Medicine

## 2018-06-27 ENCOUNTER — Ambulatory Visit (INDEPENDENT_AMBULATORY_CARE_PROVIDER_SITE_OTHER): Payer: Medicare Other | Admitting: Internal Medicine

## 2018-06-27 DIAGNOSIS — I2510 Atherosclerotic heart disease of native coronary artery without angina pectoris: Secondary | ICD-10-CM

## 2018-06-27 DIAGNOSIS — N183 Chronic kidney disease, stage 3 unspecified: Secondary | ICD-10-CM

## 2018-06-27 DIAGNOSIS — I739 Peripheral vascular disease, unspecified: Secondary | ICD-10-CM

## 2018-06-27 DIAGNOSIS — J4520 Mild intermittent asthma, uncomplicated: Secondary | ICD-10-CM

## 2018-06-27 DIAGNOSIS — M810 Age-related osteoporosis without current pathological fracture: Secondary | ICD-10-CM

## 2018-06-27 DIAGNOSIS — E0590 Thyrotoxicosis, unspecified without thyrotoxic crisis or storm: Secondary | ICD-10-CM

## 2018-06-27 DIAGNOSIS — R54 Age-related physical debility: Secondary | ICD-10-CM

## 2018-06-27 NOTE — Progress Notes (Signed)
Patient ID: Holly Bolton, female   DOB: [birth year], 85 y.o.   MRN: [id number] This service is provided via telemedicine  No vital signs collected/recorded due to the encounter was a telemedicine visit.   Location of patient (ex: home, work):  HOME  Patient consents to a telephone visit:  YES  Location of the provider (ex: office, home):  OFFICE  Name of any referring provider:  Shell Blanchette, DO  Names of all persons participating in the telemedicine service and their role in the encounter:  PATIENT, Carlos, Edwin Dada, [place], [place] DO  Time spent on call:  3:45  Virtual Visit via Video Note  I connected with Holly Bolton on 06/27/18 at  9:30 AM EDT by a video enabled telemedicine application and verified that I am speaking with the correct person using two identifiers.  Location: Patient: home Provider: office   I discussed the limitations of evaluation and management by telemedicine and the availability of in person appointments. The patient expressed understanding and agreed to proceed.  History of Present Illness: 85 yo female spoken with via iphone visit with her and her son this morning.  Feels pretty good.  No hurting.  She's having more difficulty with wheezing.  It's similar to what she had last year after a cold.  She's been getting her albuterol neb daily for the last week or 10 days.  No chills.  No much cough.  Had asthma 30 days ago.  Seems like allergies trigger it.  Not much nasal congestion--it's a little runny.  Not coughing or bringing up anything.  She has some saline spray Ed tries to get her to use.  Not more sob than usual per pt.  Ed says she is more sob during PT.  She will recover.  Sometimes when she gets up in the morning, she struggles, but albuterol relieves it.  Recommended they do breathing exercises regularly as Ed asked about.  She has a faint pain in her right leg if she does some walking.  It limits an extended walk.  She's had that  for some time.  It's not been clear what the cause is.  Saw neurology a couple of years ago--no clear explanation.  She does have a partial blockage in her femoral a.  Pain resolves with rest.  Discussed benefits of doing the walking.  She also rides her stationary bike for 10-15 mins daily.    Appetite is ok.  She's not having a great day.  She thinks she is too much trouble and she likes to be independent.  She is tearful.  She gets discouraged that she is not back to where she was.  Ed notices good progress.  She can get dressed, get ready for bed, make up her bed, make it to the bathroom ok and make a little small meal.    She does not check her bp at home anymore.  She actually needs a new meter.  The old meter died.  Discussed need for pediatric one.    Many years ago, she had been on fosamax.  She did not tolerate it.  We can discuss more about osteoporosis treatment next visit.  When she broke her arm and it healed up--it did heal properly with solid bone.  She is not on vitamin D3.    Observations/Objective: Wt 97 lbs--up 3 lbs from last visit  Assessment and Plan: 1. Senile osteoporosis -recommended otc vitamin D3 2000 units daily and will discuss  options for treatment at next visit  2. Frailty syndrome in geriatric patient -progressive, this makes her sad -she did not want antidepressants for her mood -she wishes she could be more independent  3. Mild intermittent asthma without complication -cont prn albuterol nebs -seems due to allergies  4. Hyperthyroidism -cont methimazole, last thyroid tests normal--reassess when seen in office  5. Chronic kidney disease, stage 3 (HCC) -Avoid nephrotoxic agents like nsaids, dose adjust renally excreted meds, hydrate.  6. Intermittent claudication (HCC) -with leg pain after walking longer distances--frustrates her, improves with rest  Follow Up Instructions: Please begin Vitamin D3 supplement 2000 units daily (over the counter) to  help bone strength--it also sometimes helps with balance in older adults.  We will check your calcium level when we check on your kidneys the next time you come in. Options to other osteoporosis treatments:   -Oral agents like fosamax (took years ago), boniva which is a monthly pill -prolia subcutaneous injection every 6 months (works like fosamax and boniva)--done in our office -reclast infusion annually (IV formulation, done at the infusion center at the hospital) -evenity injections monthly for one year to actually build bone, followed by prolia twice a year indefinitely (both given in our office)  I discussed the assessment and treatment plan with the patient. The patient was provided an opportunity to ask questions and all were answered. The patient agreed with the plan and demonstrated an understanding of the instructions.   The patient was advised to call back or seek an in-person evaluation if the symptoms worsen or if the condition fails to improve as anticipated.  I provided 29 minutes of non-face-to-face time during this encounter.  Sebastin Perlmutter L. Karlynn Furrow, D.O. [facility] [address], [place] [address] Cell Phone (Mon-Fri 8am-5pm):  [phone] On Call:  [phone] & follow prompts after 5pm & weekends Office Phone:  [phone] Office Fax:  [phone]

## 2018-06-27 NOTE — Patient Instructions (Addendum)
Please begin Vitamin D3 supplement 2000 units daily (over the counter) to help bone strength--it also sometimes helps with balance in older adults.  We will check your calcium level when we check on your kidneys the next time you come in. Options to other osteoporosis treatments:   -Oral agents like fosamax (took years ago), boniva which is a monthly pill -prolia subcutaneous injection every 6 months (works like fosamax and boniva)--done in our office -reclast infusion annually (IV formulation, done at the infusion center at the hospital) -evenity injections monthly for one year to actually build bone, followed by prolia twice a year indefinitely (both given in our office)  Osteoporosis  Osteoporosis is thinning and loss of density in your bones. Osteoporosis makes bones more brittle and fragile and more likely to break (fracture). Over time, osteoporosis can cause your bones to become so weak that they fracture after a minor fall. Bones in the hip, wrist, and spine are most likely to fracture due to osteoporosis. What are the causes? The exact cause of this condition is not known. What increases the risk? You may be at greater risk for osteoporosis if you:  Have a family history of the condition.  Have poor nutrition.  Use steroid medicines, such as prednisone.  Are female.  Are age 15 or older.  Smoke or have a history of smoking.  Are not physically active (are sedentary).  Are white (Caucasian) or of Asian descent.  Have a small body frame.  Take certain medicines, such as antiseizure medicines. What are the signs or symptoms? A fracture might be the first sign of osteoporosis, especially if the fracture results from a fall or injury that usually would not cause a bone to break. Other signs and symptoms include:  Pain in the neck or low back.  Stooped posture.  Loss of height. How is this diagnosed? This condition may be diagnosed based on:  Your medical history.  A  physical exam.  A bone mineral density test, also called a DXA or DEXA test (dual-energy X-ray absorptiometry test). This test uses X-rays to measure the amount of minerals in your bones. How is this treated? The goal of treatment is to strengthen your bones and lower your risk for a fracture. Treatment may involve:  Making lifestyle changes, such as: ? Including foods with more calcium and vitamin D in your diet. ? Doing weight-bearing and muscle-strengthening exercises. ? Stopping tobacco use. ? Limiting alcohol intake.  Taking medicine to slow the process of bone loss or to increase bone density.  Taking daily supplements of calcium and vitamin D.  Taking hormone replacement medicines, such as estrogen for women and testosterone for men.  Monitoring your levels of calcium and vitamin D. Follow these instructions at home:  Activity  Exercise as told by your health care provider. Ask your health care provider what exercises and activities are safe for you. You should do: ? Exercises that make you work against gravity (weight-bearing exercises), such as tai chi, yoga, or walking. ? Exercises to strengthen muscles, such as lifting weights. Lifestyle  Limit alcohol intake to no more than 1 drink a day for nonpregnant women and 2 drinks a day for men. One drink equals 12 oz of beer, 5 oz of wine, or 1 oz of hard liquor.  Do not use any products that contain nicotine or tobacco, such as cigarettes and e-cigarettes. If you need help quitting, ask your health care provider. Preventing falls  Use devices to help you  move around (mobility aids) as needed, such as canes, walkers, scooters, or crutches.  Keep rooms well-lit and clutter-free.  Remove tripping hazards from walkways, including cords and throw rugs.  Install grab bars in bathrooms and safety rails on stairs.  Use rubber mats in the bathroom and other areas that are often wet or slippery.  Wear closed-toe shoes that fit  well and support your feet. Wear shoes that have rubber soles or low heels.  Review your medicines with your health care provider. Some medicines can cause dizziness or changes in blood pressure, which can increase your risk of falling. General instructions  Include calcium and vitamin D in your diet. Calcium is important for bone health, and vitamin D helps your body to absorb calcium. Good sources of calcium and vitamin D include: ? Certain fatty fish, such as salmon and tuna. ? Products that have calcium and vitamin D added to them (fortified products), such as fortified cereals. ? Egg yolks. ? Cheese. ? Liver.  Take over-the-counter and prescription medicines only as told by your health care provider.  Keep all follow-up visits as told by your health care provider. This is important. Contact a health care provider if:  You have never been screened for osteoporosis and you are: ? A woman who is age 43 or older. ? A man who is age 57 or older. Get help right away if:  You fall or injure yourself. Summary  Osteoporosis is thinning and loss of density in your bones. This makes bones more brittle and fragile and more likely to break (fracture),even with minor falls.  The goal of treatment is to strengthen your bones and reduce your risk for a fracture.  Include calcium and vitamin D in your diet. Calcium is important for bone health, and vitamin D helps your body to absorb calcium.  Talk with your health care provider about screening for osteoporosis if you are a woman who is age 46 or older, or a man who is age 84 or older. This information is not intended to replace advice given to you by your health care provider. Make sure you discuss any questions you have with your health care provider. Document Released: 11/09/2004 Document Revised: 11/24/2016 Document Reviewed: 11/24/2016 Elsevier Interactive Patient Education  2019 [employer].

## 2018-07-02 ENCOUNTER — Other Ambulatory Visit: Payer: Self-pay

## 2018-07-02 DIAGNOSIS — I2510 Atherosclerotic heart disease of native coronary artery without angina pectoris: Secondary | ICD-10-CM

## 2018-07-02 DIAGNOSIS — I495 Sick sinus syndrome: Secondary | ICD-10-CM

## 2018-07-02 MED ORDER — CARVEDILOL 3.125 MG PO TABS: 3.1250 mg | ORAL_TABLET | Freq: Two times a day (BID) | ORAL | 1 refills | Status: DC

## 2018-09-02 ENCOUNTER — Ambulatory Visit (INDEPENDENT_AMBULATORY_CARE_PROVIDER_SITE_OTHER): Payer: Medicare Other | Admitting: *Deleted

## 2018-09-02 DIAGNOSIS — I495 Sick sinus syndrome: Secondary | ICD-10-CM | POA: Diagnosis not present

## 2018-09-03 LAB — CUP PACEART REMOTE DEVICE CHECK
Date Time Interrogation Session: 20200721085510
Implantable Lead Implant Date: 20030714
Implantable Lead Implant Date: 20030714
Implantable Lead Location: 753859
Implantable Lead Location: 753860
Implantable Pulse Generator Implant Date: 20190403
Pulse Gen Model: 2272
Pulse Gen Serial Number: 9004253

## 2018-09-16 NOTE — Progress Notes (Signed)
Remote pacemaker transmission.   

## 2018-09-20 ENCOUNTER — Encounter: Payer: Self-pay | Admitting: Internal Medicine

## 2018-09-23 ENCOUNTER — Other Ambulatory Visit: Payer: Self-pay | Admitting: Cardiovascular Disease

## 2018-09-26 ENCOUNTER — Encounter: Payer: Self-pay | Admitting: Internal Medicine

## 2018-09-26 ENCOUNTER — Ambulatory Visit (INDEPENDENT_AMBULATORY_CARE_PROVIDER_SITE_OTHER): Payer: Medicare Other | Admitting: Internal Medicine

## 2018-09-26 ENCOUNTER — Other Ambulatory Visit: Payer: Self-pay

## 2018-09-26 VITALS — BP 138/60 | HR 76 | Temp 98.0°F | Ht 59.0 in | Wt 99.0 lb

## 2018-09-26 DIAGNOSIS — N184 Chronic kidney disease, stage 4 (severe): Secondary | ICD-10-CM | POA: Diagnosis not present

## 2018-09-26 DIAGNOSIS — E0590 Thyrotoxicosis, unspecified without thyrotoxic crisis or storm: Secondary | ICD-10-CM | POA: Diagnosis not present

## 2018-09-26 DIAGNOSIS — E538 Deficiency of other specified B group vitamins: Secondary | ICD-10-CM | POA: Diagnosis not present

## 2018-09-26 DIAGNOSIS — I739 Peripheral vascular disease, unspecified: Secondary | ICD-10-CM

## 2018-09-26 DIAGNOSIS — I745 Embolism and thrombosis of iliac artery: Secondary | ICD-10-CM | POA: Diagnosis not present

## 2018-09-26 DIAGNOSIS — R54 Age-related physical debility: Secondary | ICD-10-CM

## 2018-09-26 DIAGNOSIS — M25561 Pain in right knee: Secondary | ICD-10-CM | POA: Diagnosis not present

## 2018-09-26 NOTE — Patient Instructions (Addendum)
Please try taking tylenol each morning, as well as each evening for your right posterior knee pain.  If you are still not getting relief, call us back or send a mychart message.  Watch your left leg and foot for warning signs of circulation loss: Pain Pallor (pale) Poichylothermia (cold) Paresthesias (numb) Pulselessness Those are signs of emergency. I'll send Dr. Loletha Grayer a note about your purple foot episode.

## 2018-09-26 NOTE — Progress Notes (Signed)
Location:  [facility]   Advanced Directives 02/11/2018  Does Patient Have a Medical Advance Directive? No  Type of Advance Directive -  Does patient want to make changes to medical advance directive? -  Would patient like information on creating a medical advance directive? No - Patient declined     Chief Complaint  Patient presents with  . Medical Management of Chronic Issues    37mth follow-up    HPI: Patient is a 85 y.o. female seen today for medical management of chronic diseases.    She has been struggling with progressive right knee pain for the past few months. The pain occurs all day and is described as aching. She takes aspirin in the morning and tylenol in the evening for pain. She rates the pain as a 5/10. Uses a four point cane when ambulating. No recent falls or injuries. Patient states she has stumbled twice in the last few weeks. Son states her mobility has decreased because of  knee pain. Son tries to get her to exercise on a stationary bike for atleast 25 minutes a day or walk driveway which is about 244ft.   Her appetite includes one meal and ensure shakes daily. Her meals are prepared by her son and consist of a protein and vegetable. Her diet is also low in sodium.  She is having normal bowel movements. No incontinent episodes.               Past Medical History:  Diagnosis Date  . Aneurysm (arteriovenous) of coronary vessels    ascending aorta requiring bypass of the LAD  . Carotid stenosis 04/28/08   Doppler: <40% stenosis bilateral  . Chronic kidney disease, stage 3 ([place])   . Hyperlipemia   . Hypertension   . Hypertensive chronic kidney disease   . Hypothyroidism, unspecified 02/13/2018   Symptoms well controlled with current therapy  . Mild cognitive impairment, so stated 03/28/2018   Symptoms poorly controlled, needs frequent adjustments in treatment and dose monitoring  . Mild intermittent asthma    Symptoms controlled with difficulty,  affecting daily functioning, needs ongoing monitoring  . Occlusion and stenosis of bilateral carotid arteries   . Pacemaker generator end of life 11/18/08   Intermittent high-grade atrioventricular block  . Peripheral arterial disease (HCC)    left ABI of 0.78  . Presence of permanent cardiac pacemaker 08/26/01   Sinus node dysfunction-[place]  . Presence of xenogenic heart valve   . PVD (peripheral vascular disease) ([place])   . SSS (sick sinus syndrome) ([place])   . Status post ascending aortic aneurysm repair/AVR -  Medtronic Freestyle root 09/28/2014   ascending aorta requiring bypass of the LAD     Past Surgical History:  Procedure Laterality Date  . ABDOMINAL HYSTERECTOMY  1966  . APPENDECTOMY  1943  . ASCENDING AORTIC ANEURYSM REPAIR  09/22/97   porcine aortic root  . BREAST FIBROADENOMA SURGERY  11/89  . CARDIAC SURGERY  09/22/1997   aortic valve root repair with porcine at time of CABG  . CORONARY ARTERY BYPASS GRAFT  09/22/97   LIMA to the LAD  . I&D EXTREMITY Left 08/15/2017   Procedure: IRRIGATION AND DEBRIDEMENT EXTREMITY;  Surgeon: Nicholes Stairs, MD;  Location: [facility];  Service: Orthopedics;  Laterality: Left;  . [place]   left  . NEPHRECTOMY  1958   right  . ORIF HUMERUS FRACTURE Left 08/15/2017   Procedure: OPEN REDUCTION INTERNAL FIXATION (ORIF) PROXIMAL HUMERUS FRACTURE;  Surgeon: Nicholes Stairs, MD;  Location: [facility];  Service: Orthopedics;  Laterality: Left;  . OVARIAN CYST SURGERY  1948  . PACEMAKER GENERATOR CHANGE  11/18/08   [place]  . PACEMAKER INSERTION  08/26/01   [place]  . PPM GENERATOR CHANGEOUT N/A 05/16/2017   Procedure: PPM GENERATOR CHANGEOUT;  Surgeon: Sanda Klein, MD;  Location: [facility];  Service: Cardiovascular;  Laterality: N/A;    Allergies  Allergen Reactions  . Accupril [Quinapril Hcl] Other (See Comments)    Unknown reaction   . Amlodipine Swelling  . Benadryl [Diphenhydramine Hcl] Other (See  Comments)    Unknown reaction   . Biaxin [Clarithromycin] Other (See Comments)    Unknown reaction   . Ciprofloxacin Other (See Comments)    Unknown reaction   . Codeine Other (See Comments)    Unknown reaction   . Diovan [Valsartan] Other (See Comments)    Unknown reaction   . Medrol [Methylprednisolone] Other (See Comments)    Unknown  . Morphine And Related Other (See Comments)    Unknown reaction   . Neurontin [Gabapentin] Other (See Comments)    Unknown reaction   . Penicillins     Unknown reaction  Has patient had a PCN reaction causing immediate rash, facial/tongue/throat swelling, SOB or lightheadedness with hypotension: Unknown Has patient had a PCN reaction causing severe rash involving mucus membranes or skin necrosis: Unknown Has patient had a PCN reaction that required hospitalization: Unknown Has patient had a PCN reaction occurring within the last 10 years: No If all of the above answers are "NO", then may proceed with Cephalosporin use.     Outpatient Encounter Medications as of 09/26/2018  Medication Sig  . albuterol (ACCUNEB) 0.63 MG/3ML nebulizer solution Take 1 ampule by nebulization every 6 (six) hours as needed for wheezing.  Marland Kitchen aspirin 81 MG tablet Take 81 mg by mouth daily.   . brimonidine (ALPHAGAN) 0.2 % ophthalmic solution instill ONE DROP IN [facility] EYE TWICE DAILY  . carvedilol (COREG) 3.125 MG tablet Take 1 tablet (3.125 mg total) by mouth 2 (two) times daily with a meal.  . Cholecalciferol (VITAMIN D) 50 MCG (2000 UT) CAPS Take 1 capsule by mouth daily.  . Coenzyme Q10 (COQ10) 200 MG CAPS Take 200 mg by mouth daily.  . furosemide (LASIX) 20 MG tablet TAKE ONE TABLET (20 MG) BY MOUTH TWICE A WEEK. [place]  . methimazole (TAPAZOLE) 5 MG tablet Take 1 tablet (5 mg total) by mouth 3 (three) times daily.  Marland Kitchen olmesartan (BENICAR) 40 MG tablet Take 1 tablet (40 mg total) by mouth daily.  . vitamin B-12 (CYANOCOBALAMIN) 500 MCG tablet Take 500 mcg by  mouth daily.   No facility-administered encounter medications on file as of 09/26/2018.     Review of Systems:  Review of Systems  Constitutional: Positive for activity change. Negative for appetite change and fatigue.  HENT: Negative for dental problem, hearing loss and trouble swallowing.   Respiratory: Negative for cough and shortness of breath.   Cardiovascular: Positive for leg swelling. Negative for chest pain.  Gastrointestinal: Negative for constipation, diarrhea and nausea.  Genitourinary: Negative for dysuria, frequency and hematuria.  Musculoskeletal: Positive for arthralgias and gait problem.       Right knee pain  Neurological: Positive for headaches. Negative for weakness and light-headedness.  Hematological: Bruises/bleeds easily.  Psychiatric/Behavioral: Negative for dysphoric mood and sleep disturbance. The patient is not nervous/anxious.     Health Maintenance  Topic Date  Due  . DEXA SCAN  10/17/1989  . INFLUENZA VACCINE  09/14/2018  . TETANUS/TDAP  08/15/2027  . PNA vac Low Risk Adult  Completed    Physical Exam: Vitals:   09/26/18 1133  BP: 138/60  Pulse: 76  Temp: 98 F (36.7 C)  TempSrc: Oral  SpO2: 95%  Weight: 99 lb (44.9 kg)  Height: 4\' 11"  (1.499 m)   Body mass index is 20 kg/m. Physical Exam Vitals signs reviewed.  Constitutional:      General: She is not in acute distress.    Appearance: Normal appearance. She is not ill-appearing.  HENT:     Head: Normocephalic.     Mouth/Throat:     Mouth: Mucous membranes are moist.  Cardiovascular:     Rate and Rhythm: Normal rate and regular rhythm.     Pulses: Normal pulses.     Heart sounds: Murmur present.  Pulmonary:     Effort: Pulmonary effort is normal. No respiratory distress.     Breath sounds: Normal breath sounds. No wheezing.  Abdominal:     General: Abdomen is flat. Bowel sounds are normal.     Palpations: Abdomen is soft.  Musculoskeletal:     Right knee: She exhibits normal  range of motion, no swelling and no effusion. Tenderness found.       Legs:  Neurological:     Mental Status: She is alert.     Labs reviewed: Basic Metabolic Panel: Recent Labs    10/07/17 1430 02/11/18 1049  NA 142 141  K 4.6 4.5  CL 108 108  CO2 25 27  GLUCOSE 124* 102  BUN 39* 25  CREATININE 1.33* 1.36*  CALCIUM 9.3 9.2  TSH  --  0.08*   Liver Function Tests: Recent Labs    02/11/18 1049  AST 14  ALT 8  BILITOT 0.7  PROT 6.4   No results for input(s): LIPASE, AMYLASE in the last 8760 hours. No results for input(s): AMMONIA in the last 8760 hours. CBC: Recent Labs    10/07/17 1430 02/11/18 1049  WBC 9.4 5.4  NEUTROABS 7.4 3,175  HGB 9.5* 10.1*  HCT 29.2* 29.5*  MCV 98.3 96.4  PLT 264 225   Lipid Panel: No results for input(s): CHOL, HDL, LDLCALC, TRIG, CHOLHDL, LDLDIRECT in the last 8760 hours. No results found for: HGBA1C  Procedures since last visit: No results found.  Assessment/Plan 1. Chronic kidney disease, stage 4 (severe) (HCC) - complete blood count with differential/ platelets - complete metabolic panel with GFR- today - continue to avoid nephrotoxic agents like NSAIDS, adjust medications to be renally appropriate, encourage hydration  2. 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 Intermittent claudication (HCC) - ongoing, unchanged - could be contributing factor to leg pain and increased pain when ambulating  4. Iliac artery occlusion, left (HCC) -  Bilateral lower extremity swelling unchanged - son claims lower leg was purple a few days ago - elevate legs a few times a day to help with swelling - 5 P's taught to son and patient - Contact PCP if lower extremities are painful, increased swelling, have paresthesia, no pulses, and cold  5. Hyperthyroidism  -Ongoing, unchanged - TSH, free T3 and T4- today  6. Posterior knee pain, right - could be arthritis or bakers cyst - take tylenol in AM to  help with pain - if tylenol not effective, can consider a topical - contact PCP if pain is not improved with tylenol or mobility has worsened   Labs/tests ordered: complete blood count with differential/platelets, complete metabolic panel with GFR, TSH, free T3 and T4, lipid panel- future Next appt:  4 month follow up

## 2018-09-26 NOTE — Progress Notes (Signed)
Hello, I think if foot exam is now normal will defer any testing unless it happens again. Thanks for the update, [facility]

## 2018-09-27 LAB — COMPLETE METABOLIC PANEL WITH GFR
AG Ratio: 1.4 (calc) (ref 1.0–2.5)
ALT: 11 U/L (ref 6–29)
AST: 15 U/L (ref 10–35)
Albumin: 3.9 g/dL (ref 3.6–5.1)
Alkaline phosphatase (APISO): 82 U/L (ref 37–153)
BUN/Creatinine Ratio: 24 (calc) — ABNORMAL HIGH (ref 6–22)
BUN: 31 mg/dL — ABNORMAL HIGH (ref 7–25)
CO2: 27 mmol/L (ref 20–32)
Calcium: 9.3 mg/dL (ref 8.6–10.4)
Chloride: 107 mmol/L (ref 98–110)
Creat: 1.28 mg/dL — ABNORMAL HIGH (ref 0.60–0.88)
GFR, Est African American: 42 mL/min/{1.73_m2} — ABNORMAL LOW (ref >=60)
GFR, Est Non African American: 36 mL/min/{1.73_m2} — ABNORMAL LOW (ref >=60)
Globulin: 2.7 g/dL (calc) (ref 1.9–3.7)
Glucose, Bld: 107 mg/dL (ref 65–139)
Potassium: 4.7 mmol/L (ref 3.5–5.3)
Sodium: 142 mmol/L (ref 135–146)
Total Bilirubin: 0.9 mg/dL (ref 0.2–1.2)
Total Protein: 6.6 g/dL (ref 6.1–8.1)

## 2018-09-27 LAB — T4, FREE: Free T4: 0.7 ng/dL — ABNORMAL LOW (ref 0.8–1.8)

## 2018-09-27 LAB — TSH: TSH: 18.3 mIU/L — ABNORMAL HIGH (ref 0.40–4.50)

## 2018-09-27 LAB — CBC WITH DIFFERENTIAL/PLATELET
Absolute Monocytes: 997 cells/uL — ABNORMAL HIGH (ref 200–950)
Basophils Absolute: 53 cells/uL (ref 0–200)
Basophils Relative: 0.8 %
Eosinophils Absolute: 40 cells/uL (ref 15–500)
Eosinophils Relative: 0.6 %
HCT: 31.9 % — ABNORMAL LOW (ref 35.0–45.0)
Hemoglobin: 10.4 g/dL — ABNORMAL LOW (ref 11.7–15.5)
Lymphs Abs: 858 cells/uL (ref 850–3900)
MCH: 32.9 pg (ref 27.0–33.0)
MCHC: 32.6 g/dL (ref 32.0–36.0)
MCV: 100.9 fL — ABNORMAL HIGH (ref 80.0–100.0)
MPV: 11.4 fL (ref 7.5–12.5)
Monocytes Relative: 15.1 %
Neutro Abs: 4653 cells/uL (ref 1500–7800)
Neutrophils Relative %: 70.5 %
Platelets: 210 10*3/uL (ref 140–400)
RBC: 3.16 10*6/uL — ABNORMAL LOW (ref 3.80–5.10)
RDW: 14.9 % (ref 11.0–15.0)
Total Lymphocyte: 13 %
WBC: 6.6 10*3/uL (ref 3.8–10.8)

## 2018-09-27 LAB — VITAMIN B12: Vitamin B-12: 936 pg/mL (ref 200–1100)

## 2018-09-27 LAB — T3, FREE: T3, Free: 2.8 pg/mL (ref 2.3–4.2)

## 2018-09-30 ENCOUNTER — Encounter: Payer: Self-pay | Admitting: *Deleted

## 2018-09-30 DIAGNOSIS — H43393 Other vitreous opacities, bilateral: Secondary | ICD-10-CM | POA: Diagnosis not present

## 2018-09-30 DIAGNOSIS — Z961 Presence of intraocular lens: Secondary | ICD-10-CM | POA: Diagnosis not present

## 2018-09-30 DIAGNOSIS — H401131 Primary open-angle glaucoma, bilateral, mild stage: Secondary | ICD-10-CM | POA: Diagnosis not present

## 2018-10-02 ENCOUNTER — Encounter: Payer: Self-pay | Admitting: Internal Medicine

## 2018-10-02 NOTE — Telephone Encounter (Signed)
Routed to [employer], DO

## 2018-10-11 ENCOUNTER — Emergency Department (HOSPITAL_COMMUNITY): Payer: Medicare Other

## 2018-10-11 ENCOUNTER — Ambulatory Visit (INDEPENDENT_AMBULATORY_CARE_PROVIDER_SITE_OTHER): Payer: Medicare Other | Admitting: Family

## 2018-10-11 ENCOUNTER — Inpatient Hospital Stay (HOSPITAL_COMMUNITY)
Admission: EM | Admit: 2018-10-11 | Discharge: 2018-10-15 | DRG: 291 | Disposition: A | Discharge status: Home Health | Payer: Medicare Other | Attending: Internal Medicine | Admitting: Internal Medicine

## 2018-10-11 ENCOUNTER — Other Ambulatory Visit: Payer: Self-pay

## 2018-10-11 ENCOUNTER — Encounter: Payer: Self-pay | Admitting: Family

## 2018-10-11 VITALS — BP 130/80 | HR 74 | Temp 97.1°F | Resp 28 | Ht 59.0 in | Wt 100.2 lb

## 2018-10-11 DIAGNOSIS — I130 Hypertensive heart and chronic kidney disease with heart failure and stage 1 through stage 4 chronic kidney disease, or unspecified chronic kidney disease: Principal | ICD-10-CM | POA: Diagnosis present

## 2018-10-11 DIAGNOSIS — N183 Chronic kidney disease, stage 3 unspecified: Secondary | ICD-10-CM

## 2018-10-11 DIAGNOSIS — E43 Unspecified severe protein-calorie malnutrition: Secondary | ICD-10-CM | POA: Diagnosis present

## 2018-10-11 DIAGNOSIS — Z681 Body mass index (BMI) 19 or less, adult: Secondary | ICD-10-CM | POA: Diagnosis not present

## 2018-10-11 DIAGNOSIS — J4520 Mild intermittent asthma, uncomplicated: Secondary | ICD-10-CM | POA: Diagnosis present

## 2018-10-11 DIAGNOSIS — R0602 Shortness of breath: Secondary | ICD-10-CM | POA: Diagnosis not present

## 2018-10-11 DIAGNOSIS — I5033 Acute on chronic diastolic (congestive) heart failure: Secondary | ICD-10-CM | POA: Diagnosis not present

## 2018-10-11 DIAGNOSIS — I110 Hypertensive heart disease with heart failure: Secondary | ICD-10-CM | POA: Diagnosis not present

## 2018-10-11 DIAGNOSIS — I745 Embolism and thrombosis of iliac artery: Secondary | ICD-10-CM

## 2018-10-11 DIAGNOSIS — I739 Peripheral vascular disease, unspecified: Secondary | ICD-10-CM | POA: Diagnosis present

## 2018-10-11 DIAGNOSIS — Z905 Acquired absence of kidney: Secondary | ICD-10-CM

## 2018-10-11 DIAGNOSIS — Z79899 Other long term (current) drug therapy: Secondary | ICD-10-CM

## 2018-10-11 DIAGNOSIS — E785 Hyperlipidemia, unspecified: Secondary | ICD-10-CM | POA: Diagnosis present

## 2018-10-11 DIAGNOSIS — Z951 Presence of aortocoronary bypass graft: Secondary | ICD-10-CM

## 2018-10-11 DIAGNOSIS — R0600 Dyspnea, unspecified: Secondary | ICD-10-CM

## 2018-10-11 DIAGNOSIS — I248 Other forms of acute ischemic heart disease: Secondary | ICD-10-CM | POA: Diagnosis not present

## 2018-10-11 DIAGNOSIS — Z90710 Acquired absence of both cervix and uterus: Secondary | ICD-10-CM

## 2018-10-11 DIAGNOSIS — Z950 Presence of cardiac pacemaker: Secondary | ICD-10-CM

## 2018-10-11 DIAGNOSIS — R609 Edema, unspecified: Secondary | ICD-10-CM

## 2018-10-11 DIAGNOSIS — R600 Localized edema: Secondary | ICD-10-CM | POA: Diagnosis not present

## 2018-10-11 DIAGNOSIS — I509 Heart failure, unspecified: Secondary | ICD-10-CM

## 2018-10-11 DIAGNOSIS — I2510 Atherosclerotic heart disease of native coronary artery without angina pectoris: Secondary | ICD-10-CM | POA: Diagnosis present

## 2018-10-11 DIAGNOSIS — Z7982 Long term (current) use of aspirin: Secondary | ICD-10-CM

## 2018-10-11 DIAGNOSIS — Z20828 Contact with and (suspected) exposure to other viral communicable diseases: Secondary | ICD-10-CM | POA: Diagnosis present

## 2018-10-11 DIAGNOSIS — E0590 Thyrotoxicosis, unspecified without thyrotoxic crisis or storm: Secondary | ICD-10-CM | POA: Diagnosis present

## 2018-10-11 DIAGNOSIS — I441 Atrioventricular block, second degree: Secondary | ICD-10-CM | POA: Diagnosis present

## 2018-10-11 DIAGNOSIS — D539 Nutritional anemia, unspecified: Secondary | ICD-10-CM | POA: Diagnosis present

## 2018-10-11 DIAGNOSIS — E039 Hypothyroidism, unspecified: Secondary | ICD-10-CM | POA: Diagnosis present

## 2018-10-11 LAB — CBC
HCT: 35.9 % — ABNORMAL LOW (ref 36.0–46.0)
Hemoglobin: 11.5 g/dL — ABNORMAL LOW (ref 12.0–15.0)
MCH: 33.4 pg (ref 26.0–34.0)
MCHC: 32 g/dL (ref 30.0–36.0)
MCV: 104.4 fL — ABNORMAL HIGH (ref 80.0–100.0)
Platelets: 217 10*3/uL (ref 150–400)
RBC: 3.44 MIL/uL — ABNORMAL LOW (ref 3.87–5.11)
RDW: 17.4 % — ABNORMAL HIGH (ref 11.5–15.5)
WBC: 7.4 10*3/uL (ref 4.0–10.5)
nRBC: 0.8 % — ABNORMAL HIGH (ref 0.0–0.2)

## 2018-10-11 LAB — BASIC METABOLIC PANEL
Anion gap: 11 (ref 5–15)
BUN: 31 mg/dL — ABNORMAL HIGH (ref 8–23)
CO2: 23 mmol/L (ref 22–32)
Calcium: 9.3 mg/dL (ref 8.9–10.3)
Chloride: 107 mmol/L (ref 98–111)
Creatinine, Ser: 1.61 mg/dL — ABNORMAL HIGH (ref 0.44–1.00)
GFR calc Af Amer: 32 mL/min — ABNORMAL LOW (ref >60)
GFR calc non Af Amer: 27 mL/min — ABNORMAL LOW (ref >60)
Glucose, Bld: 145 mg/dL — ABNORMAL HIGH (ref 70–99)
Potassium: 4.6 mmol/L (ref 3.5–5.1)
Sodium: 141 mmol/L (ref 135–145)

## 2018-10-11 LAB — BRAIN NATRIURETIC PEPTIDE: B Natriuretic Peptide: 2821.5 pg/mL — ABNORMAL HIGH (ref 0.0–100.0)

## 2018-10-11 LAB — LACTIC ACID, PLASMA
Lactic Acid, Venous: 1.2 mmol/L (ref 0.5–1.9)
Lactic Acid, Venous: 1.4 mmol/L (ref 0.5–1.9)

## 2018-10-11 LAB — HEPATIC FUNCTION PANEL
ALT: 19 U/L (ref 0–44)
AST: 22 U/L (ref 15–41)
Albumin: 3.8 g/dL (ref 3.5–5.0)
Alkaline Phosphatase: 80 U/L (ref 38–126)
Bilirubin, Direct: 0.3 mg/dL — ABNORMAL HIGH (ref 0.0–0.2)
Indirect Bilirubin: 1 mg/dL — ABNORMAL HIGH (ref 0.3–0.9)
Total Bilirubin: 1.3 mg/dL — ABNORMAL HIGH (ref 0.3–1.2)
Total Protein: 6.9 g/dL (ref 6.5–8.1)

## 2018-10-11 LAB — TROPONIN I (HIGH SENSITIVITY)
Troponin I (High Sensitivity): 74 ng/L — ABNORMAL HIGH (ref <18)
Troponin I (High Sensitivity): 74 ng/L — ABNORMAL HIGH (ref <18)

## 2018-10-11 LAB — LIPASE, BLOOD: Lipase: 51 U/L (ref 11–51)

## 2018-10-11 MED ORDER — FUROSEMIDE 10 MG/ML IJ SOLN
20.0000 mg | Freq: Once | INTRAMUSCULAR | Status: AC
  Administered 2018-10-11: 20 mg via INTRAVENOUS
  Filled 2018-10-11: qty 2

## 2018-10-11 MED ORDER — VITAMIN D 25 MCG (1000 UNIT) PO TABS
2000.0000 [IU] | ORAL_TABLET | Freq: Every day | ORAL | Status: DC
  Administered 2018-10-12 – 2018-10-15 (×4): 2000 [IU] via ORAL
  Filled 2018-10-11 (×5): qty 2

## 2018-10-11 MED ORDER — ALBUTEROL SULFATE (2.5 MG/3ML) 0.083% IN NEBU: 2.5000 mg | INHALATION_SOLUTION | Freq: Four times a day (QID) | RESPIRATORY_TRACT | Status: DC | PRN

## 2018-10-11 MED ORDER — ASPIRIN 81 MG PO CHEW
324.0000 mg | CHEWABLE_TABLET | Freq: Once | ORAL | Status: AC
  Administered 2018-10-11: 324 mg via ORAL
  Filled 2018-10-11: qty 4

## 2018-10-11 MED ORDER — BRIMONIDINE TARTRATE 0.2 % OP SOLN
1.0000 [drp] | Freq: Two times a day (BID) | OPHTHALMIC | Status: DC
  Administered 2018-10-12 – 2018-10-15 (×8): 1 [drp] via OPHTHALMIC
  Filled 2018-10-11: qty 5

## 2018-10-11 MED ORDER — SODIUM CHLORIDE 0.9% FLUSH: 3.0000 mL | Freq: Once | INTRAVENOUS | Status: DC

## 2018-10-11 MED ORDER — CARVEDILOL 3.125 MG PO TABS
3.1250 mg | ORAL_TABLET | Freq: Two times a day (BID) | ORAL | Status: DC
  Administered 2018-10-12 – 2018-10-15 (×7): 3.125 mg via ORAL
  Filled 2018-10-11 (×7): qty 1

## 2018-10-11 MED ORDER — ACETAMINOPHEN 500 MG PO TABS
500.0000 mg | ORAL_TABLET | Freq: Four times a day (QID) | ORAL | Status: DC | PRN
  Administered 2018-10-12 – 2018-10-13 (×2): 500 mg via ORAL
  Filled 2018-10-11 (×2): qty 1

## 2018-10-11 MED ORDER — METHIMAZOLE 5 MG PO TABS
5.0000 mg | ORAL_TABLET | Freq: Every day | ORAL | Status: DC
  Administered 2018-10-12 – 2018-10-15 (×4): 5 mg via ORAL
  Filled 2018-10-11 (×4): qty 1

## 2018-10-11 MED ORDER — ASPIRIN EC 81 MG PO TBEC
81.0000 mg | DELAYED_RELEASE_TABLET | Freq: Every day | ORAL | Status: DC
  Administered 2018-10-12 – 2018-10-15 (×4): 81 mg via ORAL
  Filled 2018-10-11 (×5): qty 1

## 2018-10-11 MED ORDER — FUROSEMIDE 10 MG/ML IJ SOLN
60.0000 mg | Freq: Two times a day (BID) | INTRAMUSCULAR | Status: AC
  Administered 2018-10-12 (×3): 60 mg via INTRAVENOUS
  Filled 2018-10-11 (×3): qty 6

## 2018-10-11 MED ORDER — ENOXAPARIN SODIUM 30 MG/0.3ML ~~LOC~~ SOLN
30.0000 mg | SUBCUTANEOUS | Status: DC
  Administered 2018-10-12 – 2018-10-15 (×4): 30 mg via SUBCUTANEOUS
  Filled 2018-10-11 (×6): qty 0.3

## 2018-10-11 MED ORDER — VITAMIN B-12 1000 MCG PO TABS
500.0000 ug | ORAL_TABLET | Freq: Every day | ORAL | Status: DC
  Administered 2018-10-12 – 2018-10-15 (×4): 500 ug via ORAL
  Filled 2018-10-11 (×5): qty 1

## 2018-10-11 NOTE — ED Notes (Signed)
Pt and husband were given sandwich and coke.

## 2018-10-11 NOTE — ED Provider Notes (Signed)
[facility] EMERGENCY DEPARTMENT Provider Note   CSN: [id number] Arrival date & time: 10/11/18  1419     History   Chief Complaint Chief Complaint  Patient presents with  . Leg Swelling  . Shortness of Breath    HPI Holly Bolton is a 85 y.o. female.     HPI  85 year old female sent over by her doctor's office for work-up of dyspnea and leg swelling.  She has had worse leg swelling than typical over the last 1 week.  Shortness of breath seems to slowly be worsening.  No cough or fever.  Some intermittent chest tightness though she thinks the last was a couple days ago.  This morning she had transient upper abdominal pain while she was particularly short of breath.  This has resolved.  She takes Lasix 20 mg twice a week for CHF, cannot take more because of impaired renal function and only one kidney.  Past Medical History:  Diagnosis Date  . Aneurysm (arteriovenous) of coronary vessels    ascending aorta requiring bypass of the LAD  . Carotid stenosis 04/28/08   Doppler: <40% stenosis bilateral  . Chronic kidney disease, stage 3 ([place])   . Hyperlipemia   . Hypertension   . Hypertensive chronic kidney disease   . Hypothyroidism, unspecified 02/13/2018   Symptoms well controlled with current therapy  . Mild cognitive impairment, so stated 03/28/2018   Symptoms poorly controlled, needs frequent adjustments in treatment and dose monitoring  . Mild intermittent asthma    Symptoms controlled with difficulty, affecting daily functioning, needs ongoing monitoring  . Occlusion and stenosis of bilateral carotid arteries   . Pacemaker generator end of life 11/18/08   Intermittent high-grade atrioventricular block  . Peripheral arterial disease (HCC)    left ABI of 0.78  . Presence of permanent cardiac pacemaker 08/26/01   Sinus node dysfunction-St.Jude  . Presence of xenogenic heart valve   . PVD (peripheral vascular disease) ([place])   . SSS (sick sinus syndrome)  ([place])   . Status post ascending aortic aneurysm repair/AVR -  Medtronic Freestyle root 09/28/2014   ascending aorta requiring bypass of the LAD     Patient Active Problem List   Diagnosis Date Noted  . Chronic kidney disease, stage 4 (severe) ([place]) 09/26/2018  . Intermittent claudication ([place]) 06/27/2018  . Mild intermittent asthma without complication 79/89/2119  . Senile osteoporosis 06/27/2018  . Bilateral bunions 03/28/2018  . Hyperthyroidism 03/20/2018  . Weight loss, unintentional 02/11/2018  . H/O hyperthyroidism 02/11/2018  . Memory loss, short term 02/11/2018  . Frailty syndrome in geriatric patient 02/11/2018  . Urge incontinence of urine 02/11/2018  . Chronic kidney disease, stage 3 ([place]) 02/11/2018  . S/p nephrectomy 02/11/2018  . B12 deficiency 02/11/2018  . Elevated troponin   . Asthma 08/15/2017  . Open left humeral fracture 08/15/2017  . Fracture 08/15/2017  . Pacemaker battery depletion 05/17/2017  . Leg cramps 06/12/2016  . Peripheral arterial disease ([place]) 12/08/2014  . Status post ascending aortic aneurysm repair/AVR -  Medtronic Freestyle root 09/28/2014  . CAD s/p CABG (LIMA to LAD) 07/08/2013  . History of aortic root repairand bioprosthetic AVR 07/08/2013  . Hyperlipidemia 07/08/2013  . SSS (sick sinus syndrome) ([place]) 07/08/2013  . Second degree AV block 07/08/2013  . Pacemaker, dual chamber St. Jude 2003/2010/2019 07/08/2013  . Essential hypertension 07/08/2013    Past Surgical History:  Procedure Laterality Date  . ABDOMINAL HYSTERECTOMY  1966  . APPENDECTOMY  1943  .  ASCENDING AORTIC ANEURYSM REPAIR  09/22/97   porcine aortic root  . BREAST FIBROADENOMA SURGERY  11/89  . CARDIAC SURGERY  09/22/1997   aortic valve root repair with porcine at time of CABG  . CORONARY ARTERY BYPASS GRAFT  09/22/97   LIMA to the LAD  . I&D EXTREMITY Left 08/15/2017   Procedure: IRRIGATION AND DEBRIDEMENT EXTREMITY;  Surgeon: Nicholes Stairs, MD;  Location: [facility];  Service: Orthopedics;  Laterality: Left;  . [place]   left  . NEPHRECTOMY  1958   right  . ORIF HUMERUS FRACTURE Left 08/15/2017   Procedure: OPEN REDUCTION INTERNAL FIXATION (ORIF) PROXIMAL HUMERUS FRACTURE;  Surgeon: Nicholes Stairs, MD;  Location: [facility];  Service: Orthopedics;  Laterality: Left;  . OVARIAN CYST SURGERY  1948  . PACEMAKER GENERATOR CHANGE  11/18/08   [employer]  . PACEMAKER INSERTION  08/26/01   [employer]ude  . PPM GENERATOR CHANGEOUT N/A 05/16/2017   Procedure: PPM GENERATOR CHANGEOUT;  Surgeon: Sanda Klein, MD;  Location: [facility];  Service: Cardiovascular;  Laterality: N/A;     OB History   No obstetric history on file.      Home Medications    Prior to Admission medications   Medication Sig Start Date End Date Taking? Authorizing Provider  albuterol (ACCUNEB) 0.63 MG/3ML nebulizer solution Take 1 ampule by nebulization every 6 (six) hours as needed for wheezing.    [provider]  aspirin 81 MG tablet Take 81 mg by mouth daily.     [provider]  brimonidine (ALPHAGAN) 0.2 % ophthalmic solution instill ONE DROP IN [facility] EYE TWICE DAILY 03/31/16   [provider]  carvedilol (COREG) 3.125 MG tablet Take 1 tablet (3.125 mg total) by mouth 2 (two) times daily with a meal. 07/02/18   Reed, Tiffany L, DO  Cholecalciferol (VITAMIN D) 50 MCG (2000 UT) CAPS Take 1 capsule by mouth daily.    [provider]  Coenzyme Q10 (COQ10) 200 MG CAPS Take 200 mg by mouth daily.    [provider]  furosemide (LASIX) 20 MG tablet TAKE ONE TABLET (20 MG) BY MOUTH TWICE A WEEK. mondays AND fridays 09/23/18   Croitoru, Mihai, MD  methimazole (TAPAZOLE) 5 MG tablet Take 5 mg by mouth daily.    [provider]  olmesartan (BENICAR) 40 MG tablet Take 1 tablet (40 mg total) by mouth daily. 05/29/18   Reed, Tiffany L, DO  vitamin B-12 (CYANOCOBALAMIN) 500 MCG tablet Take 500 mcg by mouth daily.     [provider]    Family History Family History  Problem Relation Age of Onset  . Heart attack Mother   . Heart attack Father   . Heart attack Brother   . Hyperlipidemia Sister   . Hypertension Sister     Social History Social History   Tobacco Use  . Smoking status: Never Smoker  . Smokeless tobacco: Never Used  Substance Use Topics  . Alcohol use: No  . Drug use: No     Allergies   Accupril [quinapril hcl], Amlodipine, Benadryl [diphenhydramine hcl], Biaxin [clarithromycin], Ciprofloxacin, Codeine, Diovan [valsartan], Medrol [methylprednisolone], Morphine and related, Neurontin [gabapentin], and Penicillins   Review of Systems Review of Systems  Constitutional: Negative for fever.  Respiratory: Positive for chest tightness and shortness of breath. Negative for cough.   Cardiovascular: Positive for leg swelling. Negative for chest pain.  Gastrointestinal: Positive for abdominal pain.  All other systems reviewed and  are negative.    Physical Exam Updated Vital Signs BP (!) 147/79   Pulse 81   Temp 98.9 F (37.2 C) (Oral)   Resp 20   SpO2 97%   Physical Exam Vitals signs and nursing note reviewed.  Constitutional:      General: She is not in acute distress.    Appearance: She is well-developed. She is not ill-appearing or diaphoretic.  HENT:     Head: Normocephalic and atraumatic.     Right Ear: External ear normal.     Left Ear: External ear normal.     Nose: Nose normal.  Eyes:     General:        Right eye: No discharge.        Left eye: No discharge.  Cardiovascular:     Rate and Rhythm: Normal rate and regular rhythm.     Heart sounds: Normal heart sounds.  Pulmonary:     Effort: Tachypnea present. No accessory muscle usage.     Breath sounds: Examination of the right-lower field reveals rales. Examination of the left-lower field reveals rales. Rales present.  Abdominal:     Palpations: Abdomen is soft.     Tenderness: There is no  abdominal tenderness.  Musculoskeletal:     Right lower leg: Edema present.     Left lower leg: Edema present.     Comments: BLE pitting edema from feet to just inferior to knees  Skin:    General: Skin is warm and dry.  Neurological:     Mental Status: She is alert.  Psychiatric:        Mood and Affect: Mood is not anxious.      ED Treatments / Results  Labs (all labs ordered are listed, but only abnormal results are displayed) Labs Reviewed  BASIC METABOLIC PANEL - Abnormal; Notable for the following components:      Result Value   Glucose, Bld 145 (*)    BUN 31 (*)    Creatinine, Ser 1.61 (*)    GFR calc non Af Amer 27 (*)    GFR calc Af Amer 32 (*)    All other components within normal limits  CBC - Abnormal; Notable for the following components:   RBC 3.44 (*)    Hemoglobin 11.5 (*)    HCT 35.9 (*)    MCV 104.4 (*)    RDW 17.4 (*)    nRBC 0.8 (*)    All other components within normal limits  BRAIN NATRIURETIC PEPTIDE - Abnormal; Notable for the following components:   B Natriuretic Peptide 2,821.5 (*)    All other components within normal limits  HEPATIC FUNCTION PANEL - Abnormal; Notable for the following components:   Total Bilirubin 1.3 (*)    Bilirubin, Direct 0.3 (*)    Indirect Bilirubin 1.0 (*)    All other components within normal limits  TROPONIN I (HIGH SENSITIVITY) - Abnormal; Notable for the following components:   Troponin I (High Sensitivity) 74 (*)    All other components within normal limits  SARS CORONAVIRUS 2 (TAT 6-12 HRS)  LIPASE, BLOOD  LACTIC ACID, PLASMA  LACTIC ACID, PLASMA  TROPONIN I (HIGH SENSITIVITY)    EKG EKG Interpretation  Date/Time:  Friday October 11 2018 14:32:16 EDT Ventricular Rate:  88 PR Interval:  222 QRS Duration: 160 QT Interval:  438 QTC Calculation: 529 R Axis:   -54 Text Interpretation:  Atrial-sensed ventricular-paced rhythm with prolonged AV conduction Abnormal ECG no significant change  since 2019  Confirmed by Sherwood Gambler [phone]) on 10/11/2018 3:45:17 PM   Radiology Dg Chest 2 View  Result Date: 10/11/2018 CLINICAL DATA:  Shortness of breath EXAM: CHEST - 2 VIEW COMPARISON:  02/11/2018 FINDINGS: Cardiomegaly. Prior median sternotomy. Moderate left pleural effusion and small right pleural effusion. Bibasilar atelectasis. Right pacer is unchanged. IMPRESSION: Bilateral effusions and bibasilar atelectasis, left greater than right. Mild cardiomegaly. Electronically Signed   By: Rolm Baptise M.D.   On: 10/11/2018 15:12    Procedures Procedures (including critical care time)  Medications Ordered in ED Medications  sodium chloride flush (NS) 0.9 % injection 3 mL (3 mLs Intravenous Not Given 10/11/18 1556)  furosemide (LASIX) injection 20 mg (20 mg Intravenous Given 10/11/18 1708)  aspirin chewable tablet 324 mg (324 mg Oral Given 10/11/18 1708)     Initial Impression / Assessment and Plan / ED Course  I have reviewed the triage vital signs and the nursing notes.  Pertinent labs & imaging results that were available during my care of the patient were reviewed by me and considered in my medical decision making (see chart for details).        Patient appears to have CHF exacerbation.  She is tachypneic though not hypoxic.  She will be given IV Lasix.  I discussed she will need diuresis and I think this would be best done in the hospital given her concern for her chronic kidney disease as well as solitary kidney.  Chest tightness is probably more from the fluid overload though the abdominal pain is of unclear etiology.  There is no pain now and no tenderness.  Troponin elevation is likely from the CHF though should trend in case this was atypical ACS.  Discussed with Dr. Marthenia Rolling, he will admit.  He asked for lactic acid to help screen for mesenteric ischemia.  Final Clinical Impressions(s) / ED Diagnoses   Final diagnoses:  Acute on chronic congestive heart failure, unspecified heart  failure type [facility])    ED Discharge Orders    None       Sherwood Gambler, MD 10/11/18 [phone]

## 2018-10-11 NOTE — Progress Notes (Signed)
Provider: Raoul Ciano FNP-C  Gayland Curry, DO  Patient Care Team: Gayland Curry, DO as PCP - General (Geriatric Medicine)  Extended Emergency Contact Information Primary Emergency Contact: Frei,[name] Address: [place], [place] Phone: [phone] Mobile Phone: [phone] Relation: Son  Code Status:  Goals of care: Advanced Directive information Advanced Directives 02/11/2018  Does Patient Have a Medical Advance Directive? No  Type of Advance Directive -  Does patient want to make changes to medical advance directive? -  Would patient like information on creating a medical advance directive? No - Patient declined     Chief Complaint  Patient presents with  . Acute Visit    Difficulty Breathing and SOB patient states difficulty breathing has gradually been getting worse. Had nebulizer treatment last night and this morning distress and trouble breathing.     HPI:  Pt is a 85 y.o. female seen today for an acute visit for evaluation of difficulty Breathing and shortness of breath.she is here with her son. patient states difficulty breathing has gradually been gotten worse.She has required her  nebulizer treatment last night and this morning for distress and trouble breathing.Also states swelling on her legs are getting worst.she is on furosemide 20 mg tablet twice daily and methimazole 5 mg tablet daily. she also had intermittent pain across her upper abdomen.she states unable to take additional diuretic due to her significant medical history of right Nephrectomy and left CKD.she states was advised by Nephrologist not to take Furosemide more than two times a week.she denies any cough,fever,chills or chest pain.she has had a 1.2 lbs weight gain since seen here by PCP Dr.Reed on 09/26/2018.       Past Medical History:  Diagnosis Date  . Aneurysm (arteriovenous) of coronary vessels    ascending aorta requiring bypass  of the LAD  . Carotid stenosis 04/28/08   Doppler: <40% stenosis bilateral  . Chronic kidney disease, stage 3 ([place])   . Hyperlipemia   . Hypertension   . Hypertensive chronic kidney disease   . Hypothyroidism, unspecified 02/13/2018   Symptoms well controlled with current therapy  . Mild cognitive impairment, so stated 03/28/2018   Symptoms poorly controlled, needs frequent adjustments in treatment and dose monitoring  . Mild intermittent asthma    Symptoms controlled with difficulty, affecting daily functioning, needs ongoing monitoring  . Occlusion and stenosis of bilateral carotid arteries   . Pacemaker generator end of life 11/18/08   Intermittent high-grade atrioventricular block  . Peripheral arterial disease (HCC)    left ABI of 0.78  . Presence of permanent cardiac pacemaker 08/26/01   Sinus node dysfunction-St.Jude  . Presence of xenogenic heart valve   . PVD (peripheral vascular disease) ([place])   . SSS (sick sinus syndrome) ([place])   . Status post ascending aortic aneurysm repair/AVR -  Medtronic Freestyle root 09/28/2014   ascending aorta requiring bypass of the LAD    Past Surgical History:  Procedure Laterality Date  . ABDOMINAL HYSTERECTOMY  1966  . APPENDECTOMY  1943  . ASCENDING AORTIC ANEURYSM REPAIR  09/22/97   porcine aortic root  . BREAST FIBROADENOMA SURGERY  11/89  . CARDIAC SURGERY  09/22/1997   aortic valve root repair with porcine at time of CABG  . CORONARY ARTERY BYPASS GRAFT  09/22/97   LIMA to the LAD  . I&D EXTREMITY Left 08/15/2017   Procedure: IRRIGATION AND DEBRIDEMENT EXTREMITY;  Surgeon:  Nicholes Stairs, MD;  Location: [facility];  Service: Orthopedics;  Laterality: Left;  . [place]   left  . NEPHRECTOMY  1958   right  . ORIF HUMERUS FRACTURE Left 08/15/2017   Procedure: OPEN REDUCTION INTERNAL FIXATION (ORIF) PROXIMAL HUMERUS FRACTURE;  Surgeon: Nicholes Stairs, MD;  Location: [facility];  Service: Orthopedics;  Laterality: Left;   . OVARIAN CYST SURGERY  1948  . PACEMAKER GENERATOR CHANGE  11/18/08   [facility]ude  . PACEMAKER INSERTION  08/26/01   [employer]ude  . PPM GENERATOR CHANGEOUT N/A 05/16/2017   Procedure: PPM GENERATOR CHANGEOUT;  Surgeon: Sanda Klein, MD;  Location: [facility];  Service: Cardiovascular;  Laterality: N/A;    Allergies  Allergen Reactions  . Accupril [Quinapril Hcl] Other (See Comments)    Unknown reaction   . Amlodipine Swelling  . Benadryl [Diphenhydramine Hcl] Other (See Comments)    Unknown reaction   . Biaxin [Clarithromycin] Other (See Comments)    Unknown reaction   . Ciprofloxacin Other (See Comments)    Unknown reaction   . Codeine Other (See Comments)    Unknown reaction   . Diovan [Valsartan] Other (See Comments)    Unknown reaction   . Medrol [Methylprednisolone] Other (See Comments)    Unknown  . Morphine And Related Other (See Comments)    Unknown reaction   . Neurontin [Gabapentin] Other (See Comments)    Unknown reaction   . Penicillins     Unknown reaction  Has patient had a PCN reaction causing immediate rash, facial/tongue/throat swelling, SOB or lightheadedness with hypotension: Unknown Has patient had a PCN reaction causing severe rash involving mucus membranes or skin necrosis: Unknown Has patient had a PCN reaction that required hospitalization: Unknown Has patient had a PCN reaction occurring within the last 10 years: No If all of the above answers are "NO", then may proceed with Cephalosporin use.     Outpatient Encounter Medications as of 10/11/2018  Medication Sig  . albuterol (ACCUNEB) 0.63 MG/3ML nebulizer solution Take 1 ampule by nebulization every 6 (six) hours as needed for wheezing.  Marland Kitchen aspirin 81 MG tablet Take 81 mg by mouth daily.   . brimonidine (ALPHAGAN) 0.2 % ophthalmic solution instill ONE DROP IN [facility] EYE TWICE DAILY  . carvedilol (COREG) 3.125 MG tablet Take 1 tablet (3.125 mg total) by mouth 2 (two) times daily with a meal.  .  Cholecalciferol (VITAMIN D) 50 MCG (2000 UT) CAPS Take 1 capsule by mouth daily.  . Coenzyme Q10 (COQ10) 200 MG CAPS Take 200 mg by mouth daily.  . furosemide (LASIX) 20 MG tablet TAKE ONE TABLET (20 MG) BY MOUTH TWICE A WEEK. [place]  . methimazole (TAPAZOLE) 5 MG tablet Take 5 mg by mouth daily.  Marland Kitchen olmesartan (BENICAR) 40 MG tablet Take 1 tablet (40 mg total) by mouth daily.  . vitamin B-12 (CYANOCOBALAMIN) 500 MCG tablet Take 500 mcg by mouth daily.  . [DISCONTINUED] methimazole (TAPAZOLE) 5 MG tablet Take 1 tablet (5 mg total) by mouth 3 (three) times daily. (Patient taking differently: Take 5 mg by mouth 3 (three) times daily. )   No facility-administered encounter medications on file as of 10/11/2018.     Review of Systems  Constitutional: Negative for appetite change, chills, fatigue and fever.  HENT: Negative for congestion, rhinorrhea, sinus pressure, sinus pain, sneezing and sore throat.   Eyes: Positive for visual disturbance. Negative for discharge, redness and itching.  Wears eye glasses   Respiratory: Positive for shortness of breath and wheezing. Negative for chest tightness.   Cardiovascular: Positive for leg swelling. Negative for chest pain and palpitations.  Gastrointestinal: Negative for abdominal distention, constipation, diarrhea, nausea and vomiting.       Intermittent pain across upper abdomen   Genitourinary: Negative for decreased urine volume, difficulty urinating, flank pain and urgency.    Immunization History  Administered Date(s) Administered  . Influenza, High Dose Seasonal PF 11/16/2014, 11/09/2015  . Influenza-Unspecified 11/30/2010, 11/20/2011, 11/14/2012, 11/13/2017  . Pneumococcal Conjugate-13 03/28/2018  . Pneumococcal Polysaccharide-23 01/08/2006, 02/20/2012  . Tdap 07/23/2010, 08/14/2017   Pertinent  Health Maintenance Due  Topic Date Due  . DEXA SCAN  10/17/1989  . INFLUENZA VACCINE  09/14/2018  . PNA vac Low Risk Adult   Completed   Fall Risk  10/11/2018 09/26/2018 06/27/2018 03/28/2018 02/11/2018  Falls in the past year? 0 0 0 0 1  Number falls in past yr: 0 0 0 0 1  Injury with Fall? 0 0 0 0 1    Vitals:   10/11/18 1327  BP: 130/80  Pulse: 74  Temp: (!) 97.1 F (36.2 C)  TempSrc: Oral  SpO2: 96%  Weight: 100 lb 3.2 oz (45.5 kg)  Height: 4\' 11"  (1.499 m)   Body mass index is 20.24 kg/m. Physical Exam Constitutional:      Appearance: She is normal weight.     Comments: Frail elderly with shortness of breath   Eyes:     General: No scleral icterus.       Right eye: No discharge.        Left eye: No discharge.     Conjunctiva/sclera: Conjunctivae normal.     Pupils: Pupils are equal, round, and reactive to light.     Comments: Corrective lens in place   Cardiovascular:     Rate and Rhythm: Normal rate and regular rhythm.     Pulses: Normal pulses.     Heart sounds: Murmur present. No friction rub. No gallop.   Pulmonary:     Effort: Tachypnea and accessory muscle usage present.     Breath sounds: Examination of the right-upper field reveals wheezing. Examination of the right-middle field reveals decreased breath sounds. Examination of the left-middle field reveals decreased breath sounds. Examination of the right-lower field reveals decreased breath sounds. Examination of the left-lower field reveals decreased breath sounds. Decreased breath sounds and wheezing present. No rhonchi.     Comments: Labored respiration 28 b/min with use of accessory abdominal muscle.  Chest:     Chest wall: No tenderness.  Abdominal:     General: Bowel sounds are normal. There is no distension.     Palpations: Abdomen is soft. There is no mass.     Tenderness: There is no abdominal tenderness. There is no right CVA tenderness, left CVA tenderness, guarding or rebound.  Musculoskeletal:        General: No tenderness.     Comments: Unsteady gait walks with three point cane.bilateral lower extremities 3+ edema.    Skin:    General: Skin is warm and dry.     Coloration: Skin is pale.     Findings: No bruising, erythema or rash.  Neurological:     Mental Status: She is alert.     Cranial Nerves: No cranial nerve deficit.     Sensory: No sensory deficit.     Motor: No weakness.     Coordination: Coordination normal.  Gait: Gait normal.     Comments: Alert and oriented to person and place.   Psychiatric:        Mood and Affect: Mood normal.        Behavior: Behavior normal.        Thought Content: Thought content normal.        Judgment: Judgment normal.    Labs reviewed: Recent Labs    02/11/18 1049 09/26/18 1225  NA 141 142  K 4.5 4.7  CL 108 107  CO2 27 27  GLUCOSE 102 107  BUN 25 31*  CREATININE 1.36* 1.28*  CALCIUM 9.2 9.3   Recent Labs    02/11/18 1049 09/26/18 1225  AST 14 15  ALT 8 11  BILITOT 0.7 0.9  PROT 6.4 6.6   Recent Labs    02/11/18 1049 09/26/18 1225  WBC 5.4 6.6  NEUTROABS 3,175 4,653  HGB 10.1* 10.4*  HCT 29.5* 31.9*  MCV 96.4 100.9*  PLT 225 210   Lab Results  Component Value Date   TSH 18.30 (H) 09/26/2018   No results found for: HGBA1C Lab Results  Component Value Date   CHOL 153 09/13/2017   HDL 49 09/13/2017   LDLCALC 92 09/13/2017   TRIG 62 09/13/2017    Significant Diagnostic Results in last 30 days:  No results found.  Assessment/Plan 1. Shortness of breath Afebrile.Laboured respiration with use of abdominal accessory muscle resp 28 b/min.right upper lob wheeze noted with diminished bilateral  breath sounds throughout.Has required frequent use of albuterol without any relief.with her bilateral lower extremities worsening edema and weight gain suspect fluid overload declines adjusting diuretic due to significant medical history of right nephrectomy with left CKD.Recommended ED evaluation.will need a CXR   2. Peripheral edema Bilateral 3+ edema.On furosemide 20 mg tablet twice daily and methimazole 5 mg tablet daily.latest CR  1.28 (09/26/2018).Send to ED for further evaluation declined adjusting furosemide as directed by her Nephrologist.  Family/ staff Communication: Reviewed plan of care with patient and son  Labs/tests ordered: None send to ED for further evaluation.   Sandrea Hughs, NP

## 2018-10-11 NOTE — Patient Instructions (Signed)
Please go to ED for evaluation of shortness of breath.

## 2018-10-11 NOTE — ED Notes (Signed)
ED TO INPATIENT HANDOFF REPORT  ED Nurse Name and Phone #:  [phone]  S Name/Age/Gender Holly Bolton 85 y.o. female Room/Bed: 036C/036C  Code Status   Code Status: Prior  Home/SNF/Other Home Patient oriented to: self, place, time and situation Is this baseline? Yes   Triage Complete: Triage complete  Chief Complaint kidney problem                Triage Note Patient sent by doctor for further evaluation of shortness of breath and bilateral lower extremity edema, worsening over the past couple of weeks. Patient states she has one kidney and can only take two water pills a week. Denies chest pain, dizziness, cough, fevers/chills. Resp e/u.    Allergies Allergies  Allergen Reactions  . Accupril [Quinapril Hcl] Other (See Comments)    Unknown reaction   . Amlodipine Swelling  . Benadryl [Diphenhydramine Hcl] Other (See Comments)    Unknown reaction   . Biaxin [Clarithromycin] Other (See Comments)    Unknown reaction   . Ciprofloxacin Other (See Comments)    Unknown reaction   . Codeine Other (See Comments)    Unknown reaction   . Diovan [Valsartan] Other (See Comments)    Unknown reaction   . Medrol [Methylprednisolone] Other (See Comments)    Unknown  . Morphine And Related Other (See Comments)    Unknown reaction   . Neurontin [Gabapentin] Other (See Comments)    Unknown reaction   . Penicillins     Unknown reaction  Has patient had a PCN reaction causing immediate rash, facial/tongue/throat swelling, SOB or lightheadedness with hypotension: Unknown Has patient had a PCN reaction causing severe rash involving mucus membranes or skin necrosis: Unknown Has patient had a PCN reaction that required hospitalization: Unknown Has patient had a PCN reaction occurring within the last 10 years: No If all of the above answers are "NO", then may proceed with Cephalosporin use.     Level of Care/Admitting Diagnosis ED Disposition    ED Disposition Condition Comment   Admit  Hospital Area: [facility] [[id number]]  Level of Care: Telemetry Cardiac [103]  I expect the patient will be discharged within 24 hours: No (not a candidate for 5C-Observation unit)  Covid Evaluation: Asymptomatic Screening Protocol (No Symptoms)  Diagnosis: Acute on chronic diastolic CHF (congestive heart failure) ([place]) [831517]  Admitting Physician: Bonnell Public [[id number]]  Attending Physician: Dana Allan I [[id number]]  PT Class (Do Not Modify): Observation [104]  PT Acc Code (Do Not Modify): Observation [10022]       B Medical/Surgery History Past Medical History:  Diagnosis Date  . Aneurysm (arteriovenous) of coronary vessels    ascending aorta requiring bypass of the LAD  . Carotid stenosis 04/28/08   Doppler: <40% stenosis bilateral  . Chronic kidney disease, stage 3 ([place])   . Hyperlipemia   . Hypertension   . Hypertensive chronic kidney disease   . Hypothyroidism, unspecified 02/13/2018   Symptoms well controlled with current therapy  . Mild cognitive impairment, so stated 03/28/2018   Symptoms poorly controlled, needs frequent adjustments in treatment and dose monitoring  . Mild intermittent asthma    Symptoms controlled with difficulty, affecting daily functioning, needs ongoing monitoring  . Occlusion and stenosis of bilateral carotid arteries   . Pacemaker generator end of life 11/18/08   Intermittent high-grade atrioventricular block  . Peripheral arterial disease (HCC)    left ABI of 0.78  . Presence of permanent cardiac pacemaker 08/26/01  Sinus node dysfunction-[facility]  . Presence of xenogenic heart valve   . PVD (peripheral vascular disease) ([place])   . SSS (sick sinus syndrome) ([place])   . Status post ascending aortic aneurysm repair/AVR -  Medtronic Freestyle root 09/28/2014   ascending aorta requiring bypass of the LAD    Past Surgical History:  Procedure Laterality Date  . ABDOMINAL HYSTERECTOMY  1966  . APPENDECTOMY  1943  .  ASCENDING AORTIC ANEURYSM REPAIR  09/22/97   porcine aortic root  . BREAST FIBROADENOMA SURGERY  11/89  . CARDIAC SURGERY  09/22/1997   aortic valve root repair with porcine at time of CABG  . CORONARY ARTERY BYPASS GRAFT  09/22/97   LIMA to the LAD  . I&D EXTREMITY Left 08/15/2017   Procedure: IRRIGATION AND DEBRIDEMENT EXTREMITY;  Surgeon: Nicholes Stairs, MD;  Location: [facility];  Service: Orthopedics;  Laterality: Left;  . [place]   left  . NEPHRECTOMY  1958   right  . ORIF HUMERUS FRACTURE Left 08/15/2017   Procedure: OPEN REDUCTION INTERNAL FIXATION (ORIF) PROXIMAL HUMERUS FRACTURE;  Surgeon: Nicholes Stairs, MD;  Location: [facility];  Service: Orthopedics;  Laterality: Left;  . OVARIAN CYST SURGERY  1948  . PACEMAKER GENERATOR CHANGE  11/18/08   [facility]  . PACEMAKER INSERTION  08/26/01   [place]  . PPM GENERATOR CHANGEOUT N/A 05/16/2017   Procedure: PPM GENERATOR CHANGEOUT;  Surgeon: Sanda Klein, MD;  Location: [facility];  Service: Cardiovascular;  Laterality: N/A;     A IV Location/Drains/Wounds Patient Lines/Drains/Airways Status   Active Line/Drains/Airways    Name:   Placement date:   Placement time:   Site:   Days:   Peripheral IV 10/07/17 Right Wrist   10/07/17    1439    Wrist   369   Peripheral IV 10/11/18 Right Forearm   10/11/18    1659    Forearm   less than 1   Incision (Closed) 08/15/17 Arm Left   08/15/17    2151     422   Wound 06/01/11 Skin tear Hand Left 3in skin tear on dorsal aspect L hand, closed with steri strips.    06/01/11    1734    Hand   2689          Intake/Output Last 24 hours No intake or output data in the 24 hours ending 10/11/18 2020  Labs/Imaging Results for orders placed or performed during the hospital encounter of 10/11/18 (from the past 48 hour(s))  Basic metabolic panel     Status: Abnormal   Collection Time: 10/11/18  2:32 PM  Result Value Ref Range   Sodium 141 135 - 145 mmol/L   Potassium 4.6  3.5 - 5.1 mmol/L   Chloride 107 98 - 111 mmol/L   CO2 23 22 - 32 mmol/L   Glucose, Bld 145 (H) 70 - 99 mg/dL   BUN 31 (H) 8 - 23 mg/dL   Creatinine, Ser 1.61 (H) 0.44 - 1.00 mg/dL   Calcium 9.3 8.9 - 10.3 mg/dL   GFR calc non Af Amer 27 (L) >60 mL/min   GFR calc Af Amer 32 (L) >60 mL/min   Anion gap 11 5 - 15    Comment: Performed at [facility], [address], [place], [place] [address]  CBC     Status: Abnormal   Collection Time: 10/11/18  2:32 PM  Result Value Ref Range   WBC 7.4 4.0 -  10.5 K/uL   RBC 3.44 (L) 3.87 - 5.11 MIL/uL   Hemoglobin 11.5 (L) 12.0 - 15.0 g/dL   HCT 35.9 (L) 36.0 - 46.0 %   MCV 104.4 (H) 80.0 - 100.0 fL   MCH 33.4 26.0 - 34.0 pg   MCHC 32.0 30.0 - 36.0 g/dL   RDW 17.4 (H) 11.5 - 15.5 %   Platelets 217 150 - 400 K/uL   nRBC 0.8 (H) 0.0 - 0.2 %    Comment: Performed at [facility] [address], [place], [place] [address]  Brain natriuretic peptide     Status: Abnormal   Collection Time: 10/11/18  3:00 PM  Result Value Ref Range   B Natriuretic Peptide 2,821.5 (H) 0.0 - 100.0 pg/mL    Comment: Performed at [facility] [address] [place], [place] [address]  Troponin I (High Sensitivity)     Status: Abnormal   Collection Time: 10/11/18  5:15 PM  Result Value Ref Range   Troponin I (High Sensitivity) 74 (H) <18 ng/L    Comment: (NOTE) Elevated high sensitivity troponin I (hsTnI) values and significant  changes across serial measurements may suggest ACS but many other  chronic and acute conditions are known to elevate hsTnI results.  Refer to the "Links" section for chest pain algorithms and additional  guidance. Performed at [facility], [place] [address], [place], [place] [address]   Hepatic function panel     Status: Abnormal   Collection Time: 10/11/18  5:15 PM  Result Value Ref Range   Total Protein 6.9 6.5 - 8.1 g/dL   Albumin 3.8 3.5 - 5.0 g/dL   AST 22 15 - 41 U/L   ALT 19 0 - 44 U/L   Alkaline  Phosphatase 80 38 - 126 U/L   Total Bilirubin 1.3 (H) 0.3 - 1.2 mg/dL   Bilirubin, Direct 0.3 (H) 0.0 - 0.2 mg/dL   Indirect Bilirubin 1.0 (H) 0.3 - 0.9 mg/dL    Comment: Performed at [facility] [address], [place], [place] [address]  Lipase, blood     Status: None   Collection Time: 10/11/18  5:15 PM  Result Value Ref Range   Lipase 51 11 - 51 U/L    Comment: Performed at [facility] [address], [place], [place] [address]  Troponin I (High Sensitivity)     Status: Abnormal   Collection Time: 10/11/18  6:44 PM  Result Value Ref Range   Troponin I (High Sensitivity) 74 (H) <18 ng/L    Comment: (NOTE) Elevated high sensitivity troponin I (hsTnI) values and significant  changes across serial measurements may suggest ACS but many other  chronic and acute conditions are known to elevate hsTnI results.  Refer to the "Links" section for chest pain algorithms and additional  guidance. Performed at [facility], [place] [address] [place], [place] [address]   Lactic acid, plasma     Status: None   Collection Time: 10/11/18  7:02 PM  Result Value Ref Range   Lactic Acid, Venous 1.2 0.5 - 1.9 mmol/L    Comment: Performed at [facility] [address] [place], [place] [address]   Dg Chest 2 View  Result Date: 10/11/2018 CLINICAL DATA:  Shortness of breath EXAM: CHEST - 2 VIEW COMPARISON:  02/11/2018 FINDINGS: Cardiomegaly. Prior median sternotomy. Moderate left pleural effusion and small right pleural effusion. Bibasilar atelectasis. Right pacer is unchanged. IMPRESSION: Bilateral  effusions and bibasilar atelectasis, left greater than right. Mild cardiomegaly. Electronically Signed   By: Rolm Baptise M.D.   On: 10/11/2018 15:12    Pending Labs Unresulted Labs (From admission, onward)    Start     Ordered   10/11/18 1902  Lactic acid, plasma  Now then every 2 hours,   STAT     10/11/18 1901   10/11/18 1626  SARS CORONAVIRUS 2 (TAT 6-12 HRS) Nasal Swab  Aptima Multi Swab  (Asymptomatic/Tier 2 Patients Labs)  Once,   STAT    Question Answer Comment  Is this test for diagnosis or screening Screening   Symptomatic for COVID-19 as defined by CDC No   Hospitalized for COVID-19 No   Admitted to ICU for COVID-19 No   Previously tested for COVID-19 No   Resident in a congregate (group) care setting No   Employed in healthcare setting No   Pregnant No      10/11/18 1625          Vitals/Pain Today's Vitals   10/11/18 1615 10/11/18 1815 10/11/18 1830 10/11/18 1900  BP:  (!) 166/96 (!) 175/92 (!) 170/68  Pulse: 81 86 80 79  Resp:      Temp:      TempSrc:      SpO2: 97% 96% 97% 96%  PainSc:        Isolation Precautions No active isolations  Medications Medications  sodium chloride flush (NS) 0.9 % injection 3 mL (3 mLs Intravenous Not Given 10/11/18 1556)  furosemide (LASIX) injection 20 mg (20 mg Intravenous Given 10/11/18 1708)  aspirin chewable tablet 324 mg (324 mg Oral Given 10/11/18 1708)    Mobility walks with device Low fall risk   Focused Assessments Cardiac Assessment Handoff:    Lab Results  Component Value Date   TROPONINI 0.60 ([place]) 08/18/2017   No results found for: DDIMER Does the Patient currently have chest pain? No     R Recommendations: See Admitting Provider Note  Report given to:   Additional Notes:

## 2018-10-11 NOTE — ED Triage Notes (Signed)
Patient sent by doctor for further evaluation of shortness of breath and bilateral lower extremity edema, worsening over the past couple of weeks. Patient states she has one kidney and can only take two water pills a week. Denies chest pain, dizziness, cough, fevers/chills. Resp e/u.

## 2018-10-11 NOTE — H&P (Signed)
History and Physical  DARNICE COMRIE YHC:[id number] DOB: 30-Sep-1922 DOA: 10/11/2018  Referring physician: ER provider PCP: Gayland Curry, DO  Outpatient Specialists:    Patient coming from: Home  Chief Complaint: Shortness of breath on leg swollen  HPI: Patient is a 85 year old female with multiple medical and cardiac problems as listed below.  Essentially, patient has diastolic congestive heart failure, coronary artery disease status post CABG, aortic root repair, PVD, chronic kidney disease stage III, status post right nephrectomy amongst other medical, cardiac and surgical history.  Patient was seen alongside patient's son.  Most of the history came from patient's son and prior documentation.  The patient also participated in some of the history.  Apparently, patient has had worsening leg swelling for the last 1 week, dyspnea on exertion with worsening shortness of breath, chest tightness and intermittent upper abdominal pain.  No chest pain reported.  Troponin on 2 occasions have been 74 (high sensitivity troponin).  Cardiac BNP is elevated.  Chest x-ray reveals bilateral pleural effusion.  Patient has chronic left pleural effusion.  No headache, no neck pain, no fever or chills, no URI symptoms, no GI symptoms and no urinary symptoms.  No new EKG changes.  COVID-19 test is still pending.  Hospitalist team has been asked to admit patient for further assessment and management.  ED Course: On presentation to the hospital, patient was afebrile with temperature of 98.9, blood pressure 170/68, heart rate of 79, respiratory rate of 20 and O2 sat of 96%.  Pertinent labs reveal serum creatinine of 1.61 (up from 1.28), high sensitive troponin of 74 (flat),, mean of 11.5 g/dL with MCV of 104.4.  Chest x-ray revealed bilateral effusions and bibasilar atelectasis, left greater than right with mild cardiomegaly.  Patient has received IV Lasix 20 Mg by the ER team.  Patient seems to be improving.  Pertinent  labs: As documented above.  EKG: Independently reviewed.  No new changes.  Prolonged QTc interval.  Prolonged PR and QRS interval, all chronic.  Imaging: independently reviewed.   Review of Systems:  Negative for fever, visual changes, sore throat, rash, new muscle aches, chest pain, dysuria, bleeding, n/v/abdominal pain.  Past Medical History:  Diagnosis Date  . Aneurysm (arteriovenous) of coronary vessels    ascending aorta requiring bypass of the LAD  . Carotid stenosis 04/28/08   Doppler: <40% stenosis bilateral  . Chronic kidney disease, stage 3 ([place])   . Hyperlipemia   . Hypertension   . Hypertensive chronic kidney disease   . Hypothyroidism, unspecified 02/13/2018   Symptoms well controlled with current therapy  . Mild cognitive impairment, so stated 03/28/2018   Symptoms poorly controlled, needs frequent adjustments in treatment and dose monitoring  . Mild intermittent asthma    Symptoms controlled with difficulty, affecting daily functioning, needs ongoing monitoring  . Occlusion and stenosis of bilateral carotid arteries   . Pacemaker generator end of life 11/18/08   Intermittent high-grade atrioventricular block  . Peripheral arterial disease (HCC)    left ABI of 0.78  . Presence of permanent cardiac pacemaker 08/26/01   Sinus node dysfunction-[facility]  . Presence of xenogenic heart valve   . PVD (peripheral vascular disease) ([place])   . SSS (sick sinus syndrome) ([place])   . Status post ascending aortic aneurysm repair/AVR -  Medtronic Freestyle root 09/28/2014   ascending aorta requiring bypass of the LAD     Past Surgical History:  Procedure Laterality Date  . ABDOMINAL HYSTERECTOMY  1966  . APPENDECTOMY  [place] ANEURYSM REPAIR  09/22/97   porcine aortic root  . BREAST FIBROADENOMA SURGERY  11/89  . CARDIAC SURGERY  09/22/1997   aortic valve root repair with porcine at time of CABG  . CORONARY ARTERY BYPASS GRAFT  09/22/97   LIMA to the LAD  . I&D  EXTREMITY Left 08/15/2017   Procedure: IRRIGATION AND DEBRIDEMENT EXTREMITY;  Surgeon: Nicholes Stairs, MD;  Location: [facility];  Service: Orthopedics;  Laterality: Left;  . [place]   left  . NEPHRECTOMY  1958   right  . ORIF HUMERUS FRACTURE Left 08/15/2017   Procedure: OPEN REDUCTION INTERNAL FIXATION (ORIF) PROXIMAL HUMERUS FRACTURE;  Surgeon: Nicholes Stairs, MD;  Location: [facility];  Service: Orthopedics;  Laterality: Left;  . OVARIAN CYST SURGERY  1948  . PACEMAKER GENERATOR CHANGE  11/18/08   [facility]  . PACEMAKER INSERTION  08/26/01   [facility]  . PPM GENERATOR CHANGEOUT N/A 05/16/2017   Procedure: PPM GENERATOR CHANGEOUT;  Surgeon: Sanda Klein, MD;  Location: [facility];  Service: Cardiovascular;  Laterality: N/A;     reports that she has never smoked. She has never used smokeless tobacco. She reports that she does not drink alcohol or use drugs.  Allergies  Allergen Reactions  . Accupril [Quinapril Hcl] Other (See Comments)    Unknown reaction   . Amlodipine Swelling  . Benadryl [Diphenhydramine Hcl] Other (See Comments)    Unknown reaction   . Biaxin [Clarithromycin] Other (See Comments)    Unknown reaction   . Ciprofloxacin Other (See Comments)    Unknown reaction   . Codeine Other (See Comments)    Unknown reaction   . Diovan [Valsartan] Other (See Comments)    Unknown reaction   . Medrol [Methylprednisolone] Other (See Comments)    Unknown  . Morphine And Related Other (See Comments)    Unknown reaction   . Neurontin [Gabapentin] Other (See Comments)    Unknown reaction   . Penicillins     Unknown reaction  Has patient had a PCN reaction causing immediate rash, facial/tongue/throat swelling, SOB or lightheadedness with hypotension: Unknown Has patient had a PCN reaction causing severe rash involving mucus membranes or skin necrosis: Unknown Has patient had a PCN reaction that required hospitalization: Unknown Has patient had a PCN  reaction occurring within the last 10 years: No If all of the above answers are "NO", then may proceed with Cephalosporin use.     Family History  Problem Relation Age of Onset  . Heart attack Mother   . Heart attack Father   . Heart attack Brother   . Hyperlipidemia Sister   . Hypertension Sister      Prior to Admission medications   Medication Sig Start Date End Date Taking? Authorizing Provider  acetaminophen (TYLENOL) 500 MG tablet Take 500 mg by mouth every 6 (six) hours as needed for mild pain or headache.   Yes [provider]  albuterol (ACCUNEB) 0.63 MG/3ML nebulizer solution Take 1 ampule by nebulization every 6 (six) hours as needed for wheezing.   Yes [provider]  aspirin 325 MG tablet Take 81 mg by mouth daily.    Yes [provider]  brimonidine (ALPHAGAN) 0.2 % ophthalmic solution Place 1 drop into both eyes 2 (two) times daily.  03/31/16  Yes [provider]  carvedilol (COREG) 3.125 MG tablet Take 1 tablet (3.125 mg total) by mouth 2 (two) times daily with a meal.  07/02/18  Yes Reed, Tiffany L, DO  Cholecalciferol (VITAMIN D) 50 MCG (2000 UT) CAPS Take 1 capsule by mouth daily.   Yes [provider]  Coenzyme Q10 (COQ10) 200 MG CAPS Take 200 mg by mouth daily.   Yes [provider]  furosemide (LASIX) 20 MG tablet TAKE ONE TABLET (20 MG) BY MOUTH TWICE A WEEK. mondays AND fridays Patient taking differently: Take 20 mg by mouth 2 (two) times a week. Mondays and fridays 09/23/18  Yes Croitoru, Mihai, MD  methimazole (TAPAZOLE) 5 MG tablet Take 5 mg by mouth daily.   Yes [provider]  olmesartan (BENICAR) 40 MG tablet Take 1 tablet (40 mg total) by mouth daily. 05/29/18  Yes Reed, Tiffany L, DO  vitamin B-12 (CYANOCOBALAMIN) 500 MCG tablet Take 500 mcg by mouth daily.   Yes [provider]    Physical Exam: Vitals:   10/11/18 1615 10/11/18 1815 10/11/18 1830 10/11/18 1900  BP:  (!) 166/96 (!)  175/92 (!) 170/68  Pulse: 81 86 80 79  Resp:      Temp:      TempSrc:      SpO2: 97% 96% 97% 96%    Constitutional:  . Appears calm and comfortable. Eyes:  Marland Kitchen Mild pallor. No jaundice.  ENMT:  . external ears, nose appear normal Neck:  . Neck is supple.  Difficult to assess.    Respiratory:  . Decreased air entry globally Cardiovascular:  . S1S2 . Bilateral lower leg edema 1+ to 2.     Abdomen:  . Abdomen is soft and non tender. Organs are difficult to assess. Neurologic:  . Awake and alert. . Moves all limbs.  Wt Readings from Last 3 Encounters:  10/11/18 45.5 kg  09/26/18 44.9 kg  03/28/18 42.6 kg    I have personally reviewed following labs and imaging studies  Labs on Admission:  CBC: Recent Labs  Lab 10/11/18 1432  WBC 7.4  HGB 11.5*  HCT 35.9*  MCV 104.4*  PLT 220   Basic Metabolic Panel: Recent Labs  Lab 10/11/18 1432  NA 141  K 4.6  CL 107  CO2 23  GLUCOSE 145*  BUN 31*  CREATININE 1.61*  CALCIUM 9.3   Liver Function Tests: Recent Labs  Lab 10/11/18 1715  AST 22  ALT 19  ALKPHOS 80  BILITOT 1.3*  PROT 6.9  ALBUMIN 3.8   Recent Labs  Lab 10/11/18 1715  LIPASE 51   No results for input(s): AMMONIA in the last 168 hours. Coagulation Profile: No results for input(s): INR, PROTIME in the last 168 hours. Cardiac Enzymes: No results for input(s): CKTOTAL, CKMB, CKMBINDEX, TROPONINI in the last 168 hours. BNP (last 3 results) No results for input(s): PROBNP in the last 8760 hours. HbA1C: No results for input(s): HGBA1C in the last 72 hours. CBG: No results for input(s): GLUCAP in the last 168 hours. Lipid Profile: No results for input(s): CHOL, HDL, LDLCALC, TRIG, CHOLHDL, LDLDIRECT in the last 72 hours. Thyroid Function Tests: No results for input(s): TSH, T4TOTAL, FREET4, T3FREE, THYROIDAB in the last 72 hours. Anemia Panel: No results for input(s): VITAMINB12, FOLATE, FERRITIN, TIBC, IRON, RETICCTPCT in the last 72 hours.  Urine analysis:    Component Value Date/Time   COLORURINE YELLOW 08/14/2017 0121   APPEARANCEUR CLEAR 08/14/2017 0121   LABSPEC 1.009 08/14/2017 0121   PHURINE 5.0 08/14/2017 0121   GLUCOSEU NEGATIVE 08/14/2017 0121   HGBUR NEGATIVE 08/14/2017 0121   BILIRUBINUR NEGATIVE 08/14/2017 0121  KETONESUR NEGATIVE 08/14/2017 0121   PROTEINUR NEGATIVE 08/14/2017 0121   NITRITE NEGATIVE 08/14/2017 0121   LEUKOCYTESUR NEGATIVE 08/14/2017 0121   Sepsis Labs: @LABRCNTIP (procalcitonin:4,lacticidven:4) )No results found for this or any previous visit (from the past 240 hour(s)).    Radiological Exams on Admission: Dg Chest 2 View  Result Date: 10/11/2018 CLINICAL DATA:  Shortness of breath EXAM: CHEST - 2 VIEW COMPARISON:  02/11/2018 FINDINGS: Cardiomegaly. Prior median sternotomy. Moderate left pleural effusion and small right pleural effusion. Bibasilar atelectasis. Right pacer is unchanged. IMPRESSION: Bilateral effusions and bibasilar atelectasis, left greater than right. Mild cardiomegaly. Electronically Signed   By: Rolm Baptise M.D.   On: 10/11/2018 15:12    EKG: Independently reviewed.   Active Problems:   * No active hospital problems. *   Assessment/Plan Acute on chronic diastolic congestive heart failure: Admit patient for further assessment and management. Start patient on IV Lasix Monitor renal function closely Minimize nephrotoxic medications for now. Low threshold to proceed with echocardiogram and cardiology consultation Worsening renal function may be cardiorenal. Further management depend on hospital course Monitor electrolytes.  Coronary artery disease status post CABG: High sensitive troponin is 74. Continue to monitor closely Lower threshold to consult cardiology  Chronic kidney disease stage III with mild worsening of renal function: Patient worsening renal function may be secondary to cardiac decompensation. Patient has solitary kidney Monitor renal  function and electrolytes closely For further work-up if no improvement.  Hypertension: Not optimized. May improve with increased dose of Lasix and improvement in CHF symptoms Manage expectantly.  Hyperthyroidism: Patient is on methimazole Check TSH Adjust medication accordingly.  DVT prophylaxis: Subacute Lovenox Code Status: Full code Family Communication: Son Disposition Plan: Home eventually Consults called: Low threshold to consult cardiology Admission status: Observation  Time spent: 65 minutes  Dana Allan, MD  [facility] Pager #: [phone] 7PM-7AM contact night coverage as above  10/11/2018, 7:31 PM

## 2018-10-12 DIAGNOSIS — I739 Peripheral vascular disease, unspecified: Secondary | ICD-10-CM | POA: Diagnosis present

## 2018-10-12 DIAGNOSIS — I441 Atrioventricular block, second degree: Secondary | ICD-10-CM | POA: Diagnosis present

## 2018-10-12 DIAGNOSIS — D539 Nutritional anemia, unspecified: Secondary | ICD-10-CM | POA: Diagnosis present

## 2018-10-12 DIAGNOSIS — Z950 Presence of cardiac pacemaker: Secondary | ICD-10-CM | POA: Diagnosis not present

## 2018-10-12 DIAGNOSIS — Z905 Acquired absence of kidney: Secondary | ICD-10-CM | POA: Diagnosis not present

## 2018-10-12 DIAGNOSIS — E43 Unspecified severe protein-calorie malnutrition: Secondary | ICD-10-CM | POA: Diagnosis present

## 2018-10-12 DIAGNOSIS — I5033 Acute on chronic diastolic (congestive) heart failure: Secondary | ICD-10-CM | POA: Diagnosis present

## 2018-10-12 DIAGNOSIS — E039 Hypothyroidism, unspecified: Secondary | ICD-10-CM | POA: Diagnosis present

## 2018-10-12 DIAGNOSIS — Z20828 Contact with and (suspected) exposure to other viral communicable diseases: Secondary | ICD-10-CM | POA: Diagnosis present

## 2018-10-12 DIAGNOSIS — Z79899 Other long term (current) drug therapy: Secondary | ICD-10-CM | POA: Diagnosis not present

## 2018-10-12 DIAGNOSIS — I5031 Acute diastolic (congestive) heart failure: Secondary | ICD-10-CM | POA: Diagnosis not present

## 2018-10-12 DIAGNOSIS — Z951 Presence of aortocoronary bypass graft: Secondary | ICD-10-CM | POA: Diagnosis not present

## 2018-10-12 DIAGNOSIS — N183 Chronic kidney disease, stage 3 (moderate): Secondary | ICD-10-CM | POA: Diagnosis present

## 2018-10-12 DIAGNOSIS — E0590 Thyrotoxicosis, unspecified without thyrotoxic crisis or storm: Secondary | ICD-10-CM | POA: Diagnosis present

## 2018-10-12 DIAGNOSIS — Z90710 Acquired absence of both cervix and uterus: Secondary | ICD-10-CM | POA: Diagnosis not present

## 2018-10-12 DIAGNOSIS — E785 Hyperlipidemia, unspecified: Secondary | ICD-10-CM | POA: Diagnosis present

## 2018-10-12 DIAGNOSIS — I130 Hypertensive heart and chronic kidney disease with heart failure and stage 1 through stage 4 chronic kidney disease, or unspecified chronic kidney disease: Secondary | ICD-10-CM | POA: Diagnosis present

## 2018-10-12 DIAGNOSIS — Z7982 Long term (current) use of aspirin: Secondary | ICD-10-CM | POA: Diagnosis not present

## 2018-10-12 DIAGNOSIS — R0602 Shortness of breath: Secondary | ICD-10-CM | POA: Diagnosis not present

## 2018-10-12 DIAGNOSIS — I495 Sick sinus syndrome: Secondary | ICD-10-CM | POA: Diagnosis not present

## 2018-10-12 DIAGNOSIS — J4520 Mild intermittent asthma, uncomplicated: Secondary | ICD-10-CM | POA: Diagnosis present

## 2018-10-12 DIAGNOSIS — J90 Pleural effusion, not elsewhere classified: Secondary | ICD-10-CM | POA: Diagnosis not present

## 2018-10-12 DIAGNOSIS — I2510 Atherosclerotic heart disease of native coronary artery without angina pectoris: Secondary | ICD-10-CM | POA: Diagnosis present

## 2018-10-12 DIAGNOSIS — Z681 Body mass index (BMI) 19 or less, adult: Secondary | ICD-10-CM | POA: Diagnosis not present

## 2018-10-12 DIAGNOSIS — N179 Acute kidney failure, unspecified: Secondary | ICD-10-CM | POA: Diagnosis not present

## 2018-10-12 DIAGNOSIS — I248 Other forms of acute ischemic heart disease: Secondary | ICD-10-CM | POA: Diagnosis present

## 2018-10-12 LAB — BASIC METABOLIC PANEL
Anion gap: 11 (ref 5–15)
BUN: 33 mg/dL — ABNORMAL HIGH (ref 8–23)
CO2: 25 mmol/L (ref 22–32)
Calcium: 9 mg/dL (ref 8.9–10.3)
Chloride: 105 mmol/L (ref 98–111)
Creatinine, Ser: 1.67 mg/dL — ABNORMAL HIGH (ref 0.44–1.00)
GFR calc Af Amer: 30 mL/min — ABNORMAL LOW (ref >60)
GFR calc non Af Amer: 26 mL/min — ABNORMAL LOW (ref >60)
Glucose, Bld: 118 mg/dL — ABNORMAL HIGH (ref 70–99)
Potassium: 4.2 mmol/L (ref 3.5–5.1)
Sodium: 141 mmol/L (ref 135–145)

## 2018-10-12 LAB — CBC
HCT: 37.1 % (ref 36.0–46.0)
HCT: 35.6 % — ABNORMAL LOW (ref 36.0–46.0)
Hemoglobin: 11.9 g/dL — ABNORMAL LOW (ref 12.0–15.0)
Hemoglobin: 11.6 g/dL — ABNORMAL LOW (ref 12.0–15.0)
MCH: 33.6 pg (ref 26.0–34.0)
MCH: 33 pg (ref 26.0–34.0)
MCHC: 32.1 g/dL (ref 30.0–36.0)
MCHC: 32.6 g/dL (ref 30.0–36.0)
MCV: 104.8 fL — ABNORMAL HIGH (ref 80.0–100.0)
MCV: 101.4 fL — ABNORMAL HIGH (ref 80.0–100.0)
Platelets: 82 10*3/uL — ABNORMAL LOW (ref 150–400)
Platelets: 201 10*3/uL (ref 150–400)
RBC: 3.54 MIL/uL — ABNORMAL LOW (ref 3.87–5.11)
RBC: 3.51 MIL/uL — ABNORMAL LOW (ref 3.87–5.11)
RDW: 17.5 % — ABNORMAL HIGH (ref 11.5–15.5)
RDW: 17.2 % — ABNORMAL HIGH (ref 11.5–15.5)
WBC: 8.4 10*3/uL (ref 4.0–10.5)
WBC: 7.2 10*3/uL (ref 4.0–10.5)
nRBC: 0.7 % — ABNORMAL HIGH (ref 0.0–0.2)
nRBC: 0.6 % — ABNORMAL HIGH (ref 0.0–0.2)

## 2018-10-12 LAB — CREATININE, SERUM
Creatinine, Ser: 1.66 mg/dL — ABNORMAL HIGH (ref 0.44–1.00)
GFR calc Af Amer: 30 mL/min — ABNORMAL LOW (ref >60)
GFR calc non Af Amer: 26 mL/min — ABNORMAL LOW (ref >60)

## 2018-10-12 LAB — SARS CORONAVIRUS 2 (TAT 6-24 HRS): SARS Coronavirus 2: NEGATIVE

## 2018-10-12 LAB — MAGNESIUM: Magnesium: 2.3 mg/dL (ref 1.7–2.4)

## 2018-10-12 LAB — TROPONIN I (HIGH SENSITIVITY)
Troponin I (High Sensitivity): 73 ng/L — ABNORMAL HIGH (ref <18)
Troponin I (High Sensitivity): 89 ng/L — ABNORMAL HIGH (ref <18)

## 2018-10-12 LAB — TSH: TSH: 8.881 u[IU]/mL — ABNORMAL HIGH (ref 0.350–4.500)

## 2018-10-12 LAB — PHOSPHORUS: Phosphorus: 4.2 mg/dL (ref 2.5–4.6)

## 2018-10-12 NOTE — Progress Notes (Signed)
Patient began experiencing confusion. Disoriented to place. Charge Nurse spoke with patient per her request. Her son was called to comfort and reorient her. She seemed to calm down after speaking with him. He plans to visit in the morning 10am

## 2018-10-12 NOTE — Progress Notes (Signed)
NURSING PROGRESS NOTE  KEIRAH KONITZER [id number] Admission Data: 10/12/2018 5:19 AM Attending Provider: Bonnell Public, MD TAV:WPVX, Rexene Edison, DO Code Status: full   Holly Bolton is a 85 y.o. female patient admitted from ED:  -No acute distress noted.  -No complaints of shortness of breath.  -No complaints of chest pain.  Alert/oriented X 3  Cardiac Monitoring: Box # 25 in place. Cardiac monitor yields:normal sinus rhythm.  Blood pressure (!) 144/68, pulse 73, temperature 98.8 F (37.1 C), temperature source Oral, resp. rate 16, weight 45.5 kg, SpO2 96 %.   IV Fluids:  IV in place, occlusive dsg intact without redness, IV cath forearm right, condition patent and no redness none.   Allergies:  Accupril [quinapril hcl], Amlodipine, Benadryl [diphenhydramine hcl], Biaxin [clarithromycin], Ciprofloxacin, Codeine, Diovan [valsartan], Medrol [methylprednisolone], Morphine and related, Neurontin [gabapentin], and Penicillins  Past Medical History:   has a past medical history of Aneurysm (arteriovenous) of coronary vessels, Carotid stenosis (04/28/08), Chronic kidney disease, stage 3 ([place]), Hyperlipemia, Hypertension, Hypertensive chronic kidney disease, Hypothyroidism, unspecified (02/13/2018), Mild cognitive impairment, so stated (03/28/2018), Mild intermittent asthma, Occlusion and stenosis of bilateral carotid arteries, Pacemaker generator end of life (11/18/08), Peripheral arterial disease ([place]), Presence of permanent cardiac pacemaker (08/26/01), Presence of xenogenic heart valve, PVD (peripheral vascular disease) ([place]), SSS (sick sinus syndrome) ([place]), and Status post ascending aortic aneurysm repair/AVR -  Medtronic Freestyle root (09/28/2014).  Past Surgical History:   has a past surgical history that includes Cardiac surgery (09/22/1997); Pacemaker insertion (08/26/01); Pacemaker generator change (11/18/08); Ascending aortic aneurysm repair (09/22/97); Coronary artery bypass graft  (09/22/97); Abdominal hysterectomy (1966); Kidney stone surgery (1959); Nephrectomy (1958); Ovarian cyst surgery (1948); Appendectomy (1943); Breast fibroadenoma surgery (11/89); PPM GENERATOR CHANGEOUT (N/A, 05/16/2017); ORIF humerus fracture (Left, 08/15/2017); and I&D extremity (Left, 08/15/2017).  Social History:   reports that she has never smoked. She has never used smokeless tobacco. She reports that she does not drink alcohol or use drugs.  Skin: patient skin is clean, dry and intact.   Patient/Family orientated to room. Information packet given to patient/family. Admission inpatient armband information verified with patient/family to include name and date of birth and placed on patient arm. Side rails up x 2, fall assessment and education completed with patient/family. Patient/family able to verbalize understanding of risk associated with falls and verbalized understanding to call for assistance before getting out of bed. Call light within reach. Patient/family able to voice and demonstrate understanding of unit orientation instructions.    Will continue to evaluate and treat per MD orders.

## 2018-10-12 NOTE — Progress Notes (Signed)
PROGRESS NOTE    [name]  Bolton:[id number] DOB: [birth year] DOA: 10/11/2018 PCP: Gayland Curry, DO      Brief Narrative:  [name] is a 85 y.o. F with hx HTN, SSS with pacer, history of AV repair, pig valve as well as LIMA to LAD not for atherosclerosis, chronic normocytic anemia and CKD III who presented with 1 week leg swelling, DOB and DOE.    In the ER, BNP elevated, troponin high normal.  CXR with bilateral pleural effusions.      Assessment & Plan:  Acute on chronic diastolic CHF Last echo 3154 >12 months ago showed EF 50-55%, no significant AV stenosis.  Increased est pulm pressures.  Negative 1L yesteday -Furosemide 60 mg IV twice a day  -K supplement -Strict I/Os, daily weights, telemetry  -Daily monitoring renal function -Obtain Echo -Continue carvedilol -Hold olmesartan for now  Elevated troponin Demand ishcemia, doubt ACS.   Coronary disease and PVD secondary prevention Hypertension BP controlled -Continue aspriin -Continue carvedilol  -Hold olmesartan  SSS with pacer -Continue carvedilol  CKD III [place] Cr 1.3, currently 1.6, higher than normal, likely congestion.  Hyperthyroidism TSH 8.8 -Continue methimazole  Mild macrocytic anemia            MDM and disposition: The below labs and imaging reports were reviewed and summarized above.  Medication management as above.  The patient was admitted with acute CHF.  SHe remains edematous.  Will continue IV Lasix..   This is a severe exacerbation of her chronic illness.         DVT prophylaxis: Lovenox Code Status: FULL Family Communication: Son at bedside     Procedures:   Echo 8/29   Subjective: Feeling tired.  Still somewhat swollen.  No confusion, no fever, no sputum.  Objective: Vitals:   10/11/18 2151 10/12/18 0300 10/12/18 0457 10/12/18 0856  BP: (!) 169/67  (!) 144/68 (!) 131/58  Pulse: (!) 43  73 73  Resp: 20  16 18   Temp: (!) 97.5 F (36.4 C)  98.8  F (37.1 C)   TempSrc: Oral  Oral   SpO2: 99%  96% 97%  Weight:  45.5 kg      Intake/Output Summary (Last 24 hours) at 10/12/2018 1115 Last data filed at 10/12/2018 1040 Gross per 24 hour  Intake 400 ml  Output 1450 ml  Net -1050 ml   Filed Weights   10/12/18 0300  Weight: 45.5 kg    Examination: General appearance: frail elderly adult female, sleeping but rousable, no acute distress.   HEENT: Anicteric, conjunctiva pink, lids and lashes normal. No nasal deformity, discharge, epistaxis.  Lips moist, dentition normal, OP dry, no oral lesions.   Skin: Warm and dry.  no jaundice.  No suspicious rashes or lesions. Cardiac: RRR, nl S1-S2, no murmurs appreciated.  Capillary refill is brisk.  JVP e;levated.  2+ LE edema to shins.  Radial pulses 2+ and symmetric. Respiratory: Normal respiratory rate and rhythm.  Rales at bilateral bases, lungs diminished. Abdomen: Abdomen soft.  no TTP. No ascites, distension, hepatosplenomegaly.   MSK: No deformities or effusions. Neuro: Awake and alert.  EOMI, moves all extremities. Speech fluent.    Psych: Sensorium intact and responding to questions, attention diminished.  Judgment and insight appear normal.    Data Reviewed: I have personally reviewed following labs and imaging studies:  CBC: Recent Labs  Lab 10/11/18 1432 10/11/18 2301 10/12/18 0426  WBC 7.4 8.4 7.2  HGB 11.5* 11.9* 11.6*  HCT 35.9* 37.1 35.6*  MCV 104.4* 104.8* 101.4*  PLT 217 82* 355   Basic Metabolic Panel: Recent Labs  Lab 10/11/18 1432 10/11/18 2301 10/12/18 0426  NA 141  --  141  K 4.6  --  4.2  CL 107  --  105  CO2 23  --  25  GLUCOSE 145*  --  118*  BUN 31*  --  33*  CREATININE 1.61* 1.66* 1.67*  CALCIUM 9.3  --  9.0  MG  --  2.3  --   PHOS  --  4.2  --    GFR: Estimated Creatinine Clearance: 14.4 mL/min (A) (by C-G formula based on SCr of 1.67 mg/dL (H)). Liver Function Tests: Recent Labs  Lab 10/11/18 1715  AST 22  ALT 19  ALKPHOS 80   BILITOT 1.3*  PROT 6.9  ALBUMIN 3.8   Recent Labs  Lab 10/11/18 1715  LIPASE 51   No results for input(s): AMMONIA in the last 168 hours. Coagulation Profile: No results for input(s): INR, PROTIME in the last 168 hours. Cardiac Enzymes: No results for input(s): CKTOTAL, CKMB, CKMBINDEX, TROPONINI in the last 168 hours. BNP (last 3 results) No results for input(s): PROBNP in the last 8760 hours. HbA1C: No results for input(s): HGBA1C in the last 72 hours. CBG: No results for input(s): GLUCAP in the last 168 hours. Lipid Profile: No results for input(s): CHOL, HDL, LDLCALC, TRIG, CHOLHDL, LDLDIRECT in the last 72 hours. Thyroid Function Tests: Recent Labs    10/11/18 2301  TSH 8.881*   Anemia Panel: No results for input(s): VITAMINB12, FOLATE, FERRITIN, TIBC, IRON, RETICCTPCT in the last 72 hours. Urine analysis:    Component Value Date/Time   COLORURINE YELLOW 08/14/2017 0121   APPEARANCEUR CLEAR 08/14/2017 0121   LABSPEC 1.009 08/14/2017 0121   PHURINE 5.0 08/14/2017 0121   GLUCOSEU NEGATIVE 08/14/2017 0121   HGBUR NEGATIVE 08/14/2017 0121   BILIRUBINUR NEGATIVE 08/14/2017 0121   KETONESUR NEGATIVE 08/14/2017 0121   PROTEINUR NEGATIVE 08/14/2017 0121   NITRITE NEGATIVE 08/14/2017 0121   LEUKOCYTESUR NEGATIVE 08/14/2017 0121   Sepsis Labs: @LABRCNTIP (procalcitonin:4,lacticacidven:4)  ) Recent Results (from the past 240 hour(s))  SARS CORONAVIRUS 2 (TAT 6-12 HRS) Nasal Swab Aptima Multi Swab     Status: None   Collection Time: 10/11/18  4:26 PM   Specimen: Aptima Multi Swab; Nasal Swab  Result Value Ref Range Status   SARS Coronavirus 2 NEGATIVE NEGATIVE Final    Comment: (NOTE) SARS-CoV-2 target nucleic acids are NOT DETECTED. The SARS-CoV-2 RNA is generally detectable in upper and lower respiratory specimens during the acute phase of infection. Negative results do not preclude SARS-CoV-2 infection, do not rule out co-infections with other pathogens, and  should not be used as the sole basis for treatment or other patient management decisions. Negative results must be combined with clinical observations, patient history, and epidemiological information. The expected result is Negative. Fact Sheet for Patients: SugarRoll.be Fact Sheet for Healthcare Providers: https://www.woods-mathews.com/ This test is not yet approved or cleared by the [place] FDA and  has been authorized for detection and/or diagnosis of SARS-CoV-2 by FDA under an Emergency Use Authorization (EUA). This EUA will remain  in effect (meaning this test can be used) for the duration of the COVID-19 declaration under Section 56 4(b)(1) of the Act, 21 U.S.C. section 360bbb-3(b)(1), unless the authorization is terminated or revoked sooner. Performed at [facility], [place] [address], [place], [place] [address]  Radiology Studies: Dg Chest 2 View  Result Date: 10/11/2018 CLINICAL DATA:  Shortness of breath EXAM: CHEST - 2 VIEW COMPARISON:  02/11/2018 FINDINGS: Cardiomegaly. Prior median sternotomy. Moderate left pleural effusion and small right pleural effusion. Bibasilar atelectasis. Right pacer is unchanged. IMPRESSION: Bilateral effusions and bibasilar atelectasis, left greater than right. Mild cardiomegaly. Electronically Signed   By: Rolm Baptise M.D.   On: 10/11/2018 15:12        Scheduled Meds: . aspirin EC  81 mg Oral Daily  . brimonidine  1 drop Both Eyes BID  . carvedilol  3.125 mg Oral BID WC  . cholecalciferol  2,000 Units Oral Daily  . enoxaparin (LOVENOX) injection  30 mg Subcutaneous Q24H  . furosemide  60 mg Intravenous Q12H  . methimazole  5 mg Oral Daily  . sodium chloride flush  3 mL Intravenous Once  . vitamin B-12  500 mcg Oral Daily   Continuous Infusions:   LOS: 0 days    Time spent: 35 minutes    Edwin Dada, MD [facility] 10/12/2018, 11:15 AM      Please page through [place]:  [email or web address] Password [email or web address] If 7PM-7AM, please contact night-coverage

## 2018-10-13 ENCOUNTER — Inpatient Hospital Stay (HOSPITAL_COMMUNITY): Payer: Medicare Other

## 2018-10-13 DIAGNOSIS — I5031 Acute diastolic (congestive) heart failure: Secondary | ICD-10-CM

## 2018-10-13 LAB — BASIC METABOLIC PANEL
Anion gap: 12 (ref 5–15)
BUN: 33 mg/dL — ABNORMAL HIGH (ref 8–23)
CO2: 30 mmol/L (ref 22–32)
Calcium: 8.9 mg/dL (ref 8.9–10.3)
Chloride: 100 mmol/L (ref 98–111)
Creatinine, Ser: 1.72 mg/dL — ABNORMAL HIGH (ref 0.44–1.00)
GFR calc Af Amer: 29 mL/min — ABNORMAL LOW (ref >60)
GFR calc non Af Amer: 25 mL/min — ABNORMAL LOW (ref >60)
Glucose, Bld: 94 mg/dL (ref 70–99)
Potassium: 3.5 mmol/L (ref 3.5–5.1)
Sodium: 142 mmol/L (ref 135–145)

## 2018-10-13 LAB — CBC
HCT: 30.9 % — ABNORMAL LOW (ref 36.0–46.0)
Hemoglobin: 10.4 g/dL — ABNORMAL LOW (ref 12.0–15.0)
MCH: 33.3 pg (ref 26.0–34.0)
MCHC: 33.7 g/dL (ref 30.0–36.0)
MCV: 99 fL (ref 80.0–100.0)
Platelets: 181 10*3/uL (ref 150–400)
RBC: 3.12 MIL/uL — ABNORMAL LOW (ref 3.87–5.11)
RDW: 17.2 % — ABNORMAL HIGH (ref 11.5–15.5)
WBC: 5.1 10*3/uL (ref 4.0–10.5)
nRBC: 0 % (ref 0.0–0.2)

## 2018-10-13 LAB — ECHOCARDIOGRAM COMPLETE
Height: 59 in
Weight: 1495.6 oz

## 2018-10-13 MED ORDER — FUROSEMIDE 20 MG PO TABS
20.0000 mg | ORAL_TABLET | Freq: Every day | ORAL | Status: DC
  Administered 2018-10-14 – 2018-10-15 (×2): 20 mg via ORAL
  Filled 2018-10-13 (×3): qty 1

## 2018-10-13 NOTE — Progress Notes (Signed)
PROGRESS NOTE    CINCERE DEPREY  JJK:[id number] DOB: March 27, 1922 DOA: 10/11/2018 PCP: Gayland Curry, DO      Brief Narrative:  Mrs. Scruggs is a 85 y.o. F with hx HTN, SSS with pacer, history of AV repair, pig valve as well as LIMA to LAD not for atherosclerosis, chronic normocytic anemia and CKD III who presented with 1 week leg swelling, DOB and DOE.    In the ER, BNP elevated, troponin high normal.  CXR with bilateral pleural effusions.      Assessment & Plan:  Acute on chronic diastolic CHF Last echo 3716 >12 months ago showed EF 50-55%, no significant AV stenosis.  Increased est pulm pressures.  -1.7 L yesterday, net -2.8 L on admission.  Creatinine bicarb BUN slightly up, normal. -Hold Lasix today, resume oral Lasix tomorrow -K supplement -Strict I/Os, daily weights, telemetry  -Daily monitoring renal function -Follow echo -Continue carvedilol -Hold olmesartan for now  Elevated troponin Demand ishcemia, doubt ACS.   Coronary disease and PVD secondary prevention Hypertension Pressure controlled -Continue aspriin -Continue carvedilol  -Hold olmesartan  SSS with pacer Telemetry shows paced rhythm -Continue carvedilol  CKD III Baseline Cr 1.3, trending slightly up with diuresis.  Hyperthyroidism TSH 8.8 -Continue methimazole  Mild macrocytic anemia            MDM and disposition: The below labs and imaging reports reviewed and summarized above.  Medication management as above.  The patient was admitted with acute congestive heart failure.  We will hold IV Lasix and transition oral diuretics, and monitor renal function closely.  Possibly home with home health tomorrow if echo normal and renal function back to normal.       DVT prophylaxis: Lovenox Code Status: FULL Family Communication: Son by phone     Procedures:   Echo 8/29   Subjective: Legs are sore, swelling resolved, no confusion, sputum, fever, cough.  No chest pain or  palpitations.    Objective: Vitals:   10/12/18 1616 10/12/18 2018 10/13/18 0429 10/13/18 0847  BP: (!) 145/55 (!) 122/45 (!) 147/62 (!) 144/60  Pulse: 68 61 80 82  Resp:  18 18   Temp:  97.6 F (36.4 C) 98.2 F (36.8 C)   TempSrc:  Oral Oral   SpO2:  93% 93% 99%  Weight:   42.4 kg     Intake/Output Summary (Last 24 hours) at 10/13/2018 1101 Last data filed at 10/13/2018 0854 Gross per 24 hour  Intake 600 ml  Output 2150 ml  Net -1550 ml   Filed Weights   10/12/18 0300 10/13/18 0429  Weight: 45.5 kg 42.4 kg    Examination: General appearance: Frail elderly female, lying in bed, interactive. HEENT: Anicteric, conjunctival pink, lids and lashes normal.  No nasal deformity, discharge, or epistaxis.  Lips moist, dentition normal, oropharynx moist, no oral lesions.   Skin: Skin warm and dry, no jaundice or suspicious rashes or lesions. Cardiac: Regular rate and rhythm, no murmurs, JVP normal, no lower extremity edema. Respiratory: Normal respiratory effort, no rales or wheezes. Abdomen: Abdomen soft without tenderness to palpation or guarding.  No ascites, distention, hepatosplenomegaly.   MSK: Diffuse loss of subcutaneous muscle mass and fat. Neuro: Awake and alert, extraocular movements intact, moves all extremities with global weakness, appropriate for age, normal coordination.  Speech fluent.    Psych: Oriented to person, place, and time.  Slightly forgetful.  Attention normal, judgment insight appear mildly impaired.    Data Reviewed: I have personally  reviewed following labs and imaging studies:  CBC: Recent Labs  Lab 10/11/18 1432 10/11/18 2301 10/12/18 0426 10/13/18 0416  WBC 7.4 8.4 7.2 5.1  HGB 11.5* 11.9* 11.6* 10.4*  HCT 35.9* 37.1 35.6* 30.9*  MCV 104.4* 104.8* 101.4* 99.0  PLT 217 82* 201 144   Basic Metabolic Panel: Recent Labs  Lab 10/11/18 1432 10/11/18 2301 10/12/18 0426 10/13/18 0416  NA 141  --  141 142  K 4.6  --  4.2 3.5  CL 107  --   105 100  CO2 23  --  25 30  GLUCOSE 145*  --  118* 94  BUN 31*  --  33* 33*  CREATININE 1.61* 1.66* 1.67* 1.72*  CALCIUM 9.3  --  9.0 8.9  MG  --  2.3  --   --   PHOS  --  4.2  --   --    GFR: Estimated Creatinine Clearance: 13.7 mL/min (A) (by C-G formula based on SCr of 1.72 mg/dL (H)). Liver Function Tests: Recent Labs  Lab 10/11/18 1715  AST 22  ALT 19  ALKPHOS 80  BILITOT 1.3*  PROT 6.9  ALBUMIN 3.8   Recent Labs  Lab 10/11/18 1715  LIPASE 51   No results for input(s): AMMONIA in the last 168 hours. Coagulation Profile: No results for input(s): INR, PROTIME in the last 168 hours. Cardiac Enzymes: No results for input(s): CKTOTAL, CKMB, CKMBINDEX, TROPONINI in the last 168 hours. BNP (last 3 results) No results for input(s): PROBNP in the last 8760 hours. HbA1C: No results for input(s): HGBA1C in the last 72 hours. CBG: No results for input(s): GLUCAP in the last 168 hours. Lipid Profile: No results for input(s): CHOL, HDL, LDLCALC, TRIG, CHOLHDL, LDLDIRECT in the last 72 hours. Thyroid Function Tests: Recent Labs    10/11/18 2301  TSH 8.881*   Anemia Panel: No results for input(s): VITAMINB12, FOLATE, FERRITIN, TIBC, IRON, RETICCTPCT in the last 72 hours. Urine analysis:    Component Value Date/Time   COLORURINE YELLOW 08/14/2017 0121   APPEARANCEUR CLEAR 08/14/2017 0121   LABSPEC 1.009 08/14/2017 0121   PHURINE 5.0 08/14/2017 0121   GLUCOSEU NEGATIVE 08/14/2017 0121   HGBUR NEGATIVE 08/14/2017 0121   BILIRUBINUR NEGATIVE 08/14/2017 0121   KETONESUR NEGATIVE 08/14/2017 0121   PROTEINUR NEGATIVE 08/14/2017 0121   NITRITE NEGATIVE 08/14/2017 0121   LEUKOCYTESUR NEGATIVE 08/14/2017 0121   Sepsis Labs: @LABRCNTIP (procalcitonin:4,lacticacidven:4)  ) Recent Results (from the past 240 hour(s))  SARS CORONAVIRUS 2 (TAT 6-12 HRS) Nasal Swab Aptima Multi Swab     Status: None   Collection Time: 10/11/18  4:26 PM   Specimen: Aptima Multi Swab; Nasal  Swab  Result Value Ref Range Status   SARS Coronavirus 2 NEGATIVE NEGATIVE Final    Comment: (NOTE) SARS-CoV-2 target nucleic acids are NOT DETECTED. The SARS-CoV-2 RNA is generally detectable in upper and lower respiratory specimens during the acute phase of infection. Negative results do not preclude SARS-CoV-2 infection, do not rule out co-infections with other pathogens, and should not be used as the sole basis for treatment or other patient management decisions. Negative results must be combined with clinical observations, patient history, and epidemiological information. The expected result is Negative. Fact Sheet for Patients: SugarRoll.be Fact Sheet for Healthcare Providers: https://www.woods-mathews.com/ This test is not yet approved or cleared by the [place] FDA and  has been authorized for detection and/or diagnosis of SARS-CoV-2 by FDA under an Emergency Use Authorization (EUA). This EUA will remain  in effect (meaning this test can be used) for the duration of the COVID-19 declaration under Section 56 4(b)(1) of the Act, 21 U.S.C. section 360bbb-3(b)(1), unless the authorization is terminated or revoked sooner. Performed at [facility], [place] [address], [place], [place] [address]          Radiology Studies: Dg Chest 2 View  Result Date: 10/11/2018 CLINICAL DATA:  Shortness of breath EXAM: CHEST - 2 VIEW COMPARISON:  02/11/2018 FINDINGS: Cardiomegaly. Prior median sternotomy. Moderate left pleural effusion and small right pleural effusion. Bibasilar atelectasis. Right pacer is unchanged. IMPRESSION: Bilateral effusions and bibasilar atelectasis, left greater than right. Mild cardiomegaly. Electronically Signed   By: Rolm Baptise M.D.   On: 10/11/2018 15:12        Scheduled Meds: . aspirin EC  81 mg Oral Daily  . brimonidine  1 drop Both Eyes BID  . carvedilol  3.125 mg Oral BID WC  . cholecalciferol  2,000  Units Oral Daily  . enoxaparin (LOVENOX) injection  30 mg Subcutaneous Q24H  . [START ON 10/14/2018] furosemide  20 mg Oral Daily  . methimazole  5 mg Oral Daily  . sodium chloride flush  3 mL Intravenous Once  . vitamin B-12  500 mcg Oral Daily   Continuous Infusions:   LOS: 1 day    Time spent: 25 minutes    Edwin Dada, MD [facility] 10/13/2018, 11:01 AM     Please page through [place]:  [email or web address] Password [email or web address] If 7PM-7AM, please contact night-coverage

## 2018-10-13 NOTE — Progress Notes (Signed)
  Echocardiogram 2D Echocardiogram has been performed.  Haddie Bruhl L Androw 10/13/2018, 3:07 PM

## 2018-10-13 NOTE — Evaluation (Signed)
Physical Therapy Evaluation Patient Details Name: Holly Bolton MRN: [id number] DOB: [birth year] Today's Date: 10/13/2018   History of Present Illness  y.o. F with hx HTN, SSS with pacer, history of AV repair, pig valve as well as LIMA to LAD not for atherosclerosis, chronic normocytic anemia and CKD III who presented with 1 week leg swelling, DOB and DOE.    Clinical Impression  Pt admitted with above diagnosis. PTA pt lived at home with her son, mod I mobility with cane vs RW. On eval, she required min assist bed mobility, min assist transfers and min guard assist ambulation 25 feet with RW.  Pt currently with functional limitations due to the deficits listed below (see PT Problem List). Pt will benefit from skilled PT to increase their independence and safety with mobility to allow discharge to the venue listed below.       Follow Up Recommendations Home health PT;Supervision/Assistance - 24 hour    Equipment Recommendations  None recommended by PT    Recommendations for Other Services       Precautions / Restrictions Precautions Precautions: Fall      Mobility  Bed Mobility Overal bed mobility: Needs Assistance Bed Mobility: Supine to Sit;Sit to Supine     Supine to sit: Min guard;HOB elevated Sit to supine: Min assist;HOB elevated   General bed mobility comments: +rail, increased time and effort  Transfers Overall transfer level: Needs assistance Equipment used: Rolling walker (2 wheeled) Transfers: Sit to/from Stand Sit to Stand: Min assist         General transfer comment: cues for hand placement, assist to power up  Ambulation/Gait Ambulation/Gait assistance: Min guard Gait Distance (Feet): 25 Feet Assistive device: Rolling walker (2 wheeled) Gait Pattern/deviations: Step-through pattern;Decreased stride length;Trunk flexed Gait velocity: decreased Gait velocity interpretation: <1.31 ft/sec, indicative of household ambulator General Gait Details: distance  limited by BLE pain  Stairs            Wheelchair Mobility    Modified Rankin (Stroke Patients Only)       Balance Overall balance assessment: Needs assistance Sitting-balance support: No upper extremity supported;Feet supported Sitting balance-Leahy Scale: Fair     Standing balance support: Bilateral upper extremity supported;During functional activity Standing balance-Leahy Scale: Poor Standing balance comment: reliant on external support                             Pertinent Vitals/Pain Pain Assessment: Faces Faces Pain Scale: Hurts even more Pain Location: BLE after ambulating Pain Descriptors / Indicators: Sore;Aching;Grimacing;Guarding Pain Intervention(s): Limited activity within patient's tolerance;Monitored during session;Patient requesting pain meds-RN notified    Home Living Family/patient expects to be discharged to:: Private residence Living Arrangements: Children(son) Available Help at Discharge: Family;Available PRN/intermittently Type of Home: House Home Access: Stairs to enter Entrance Stairs-Rails: None Entrance Stairs-Number of Steps: 2 Home Layout: One level Home Equipment: Cane - single point;Walker - 2 wheels      Prior Function Level of Independence: Independent with assistive device(s)               Hand Dominance   Dominant Hand: Right    Extremity/Trunk Assessment   Upper Extremity Assessment Upper Extremity Assessment: Generalized weakness    Lower Extremity Assessment Lower Extremity Assessment: Generalized weakness    Cervical / Trunk Assessment Cervical / Trunk Assessment: Kyphotic  Communication   Communication: No difficulties  Cognition Arousal/Alertness: Awake/alert Behavior During Therapy: WFL for tasks assessed/performed Overall  Cognitive Status: Within Functional Limits for tasks assessed                                        General Comments General comments (skin  integrity, edema, etc.): Pt on RA with SpO2 >90%.    Exercises     Assessment/Plan    PT Assessment Patient needs continued PT services  PT Problem List Decreased strength;Decreased mobility;Decreased activity tolerance;Cardiopulmonary status limiting activity;Pain;Decreased balance       PT Treatment Interventions Therapeutic activities;DME instruction;Gait training;Therapeutic exercise;Patient/family education;Stair training;Balance training;Functional mobility training    PT Goals (Current goals can be found in the Care Plan section)  Acute Rehab PT Goals Patient Stated Goal: decrease leg swelling PT Goal Formulation: With patient Time For Goal Achievement: 10/27/18 Potential to Achieve Goals: Fair    Frequency Min 3X/week   Barriers to discharge        Co-evaluation               AM-PAC PT "6 Clicks" Mobility  Outcome Measure Help needed turning from your back to your side while in a flat bed without using bedrails?: A Little Help needed moving from lying on your back to sitting on the side of a flat bed without using bedrails?: A Little Help needed moving to and from a bed to a chair (including a wheelchair)?: A Little Help needed standing up from a chair using your arms (e.g., wheelchair or bedside chair)?: A Little Help needed to walk in hospital room?: A Little Help needed climbing 3-5 steps with a railing? : A Lot 6 Click Score: 17    End of Session Equipment Utilized During Treatment: Gait belt Activity Tolerance: Patient limited by pain Patient left: in bed;with bed alarm set;with call bell/phone within reach Nurse Communication: Patient requests pain meds PT Visit Diagnosis: Pain;Difficulty in walking, not elsewhere classified (R26.2)    Time: 7972-8206 PT Time Calculation (min) (ACUTE ONLY): 12 min   Charges:   PT Evaluation $PT Eval Moderate Complexity: 1 Mod          Lorrin Goodell, PT  Office # [phone] Pager [phone]   Lorriane Shire 10/13/2018, 11:32 AM

## 2018-10-13 NOTE — Progress Notes (Signed)
Per MD d/c teli since IV Lasix is stopped.  Teli d/c. Primary nurse aware.

## 2018-10-14 ENCOUNTER — Inpatient Hospital Stay (HOSPITAL_COMMUNITY): Payer: Medicare Other

## 2018-10-14 DIAGNOSIS — J90 Pleural effusion, not elsewhere classified: Secondary | ICD-10-CM

## 2018-10-14 DIAGNOSIS — N183 Chronic kidney disease, stage 3 (moderate): Secondary | ICD-10-CM

## 2018-10-14 DIAGNOSIS — I495 Sick sinus syndrome: Secondary | ICD-10-CM

## 2018-10-14 LAB — BASIC METABOLIC PANEL
Anion gap: 10 (ref 5–15)
BUN: 36 mg/dL — ABNORMAL HIGH (ref 8–23)
CO2: 29 mmol/L (ref 22–32)
Calcium: 8.5 mg/dL — ABNORMAL LOW (ref 8.9–10.3)
Chloride: 100 mmol/L (ref 98–111)
Creatinine, Ser: 1.68 mg/dL — ABNORMAL HIGH (ref 0.44–1.00)
GFR calc Af Amer: 30 mL/min — ABNORMAL LOW (ref >60)
GFR calc non Af Amer: 26 mL/min — ABNORMAL LOW (ref >60)
Glucose, Bld: 93 mg/dL (ref 70–99)
Potassium: 3.5 mmol/L (ref 3.5–5.1)
Sodium: 139 mmol/L (ref 135–145)

## 2018-10-14 LAB — CBC
HCT: 29.1 % — ABNORMAL LOW (ref 36.0–46.0)
Hemoglobin: 9.7 g/dL — ABNORMAL LOW (ref 12.0–15.0)
MCH: 33.2 pg (ref 26.0–34.0)
MCHC: 33.3 g/dL (ref 30.0–36.0)
MCV: 99.7 fL (ref 80.0–100.0)
Platelets: 177 10*3/uL (ref 150–400)
RBC: 2.92 MIL/uL — ABNORMAL LOW (ref 3.87–5.11)
RDW: 17.3 % — ABNORMAL HIGH (ref 11.5–15.5)
WBC: 5.2 10*3/uL (ref 4.0–10.5)
nRBC: 0.4 % — ABNORMAL HIGH (ref 0.0–0.2)

## 2018-10-14 LAB — BRAIN NATRIURETIC PEPTIDE: B Natriuretic Peptide: 901.8 pg/mL — ABNORMAL HIGH (ref 0.0–100.0)

## 2018-10-14 LAB — T4, FREE: Free T4: 0.78 ng/dL (ref 0.61–1.12)

## 2018-10-14 MED ORDER — IPRATROPIUM-ALBUTEROL 0.5-2.5 (3) MG/3ML IN SOLN: 3.0000 mL | RESPIRATORY_TRACT | Status: DC | PRN

## 2018-10-14 MED ORDER — POLYETHYLENE GLYCOL 3350 17 G PO PACK: 17.0000 g | PACK | Freq: Every day | ORAL | Status: DC | PRN

## 2018-10-14 MED ORDER — POTASSIUM CHLORIDE CRYS ER 20 MEQ PO TBCR
40.0000 meq | EXTENDED_RELEASE_TABLET | Freq: Once | ORAL | Status: AC
  Administered 2018-10-14: 40 meq via ORAL
  Filled 2018-10-14: qty 2

## 2018-10-14 MED ORDER — FUROSEMIDE 10 MG/ML IJ SOLN
40.0000 mg | Freq: Once | INTRAMUSCULAR | Status: AC
  Administered 2018-10-14: 40 mg via INTRAVENOUS
  Filled 2018-10-14: qty 4

## 2018-10-14 MED ORDER — SENNOSIDES-DOCUSATE SODIUM 8.6-50 MG PO TABS
2.0000 | ORAL_TABLET | Freq: Every evening | ORAL | Status: DC | PRN
  Filled 2018-10-14: qty 2

## 2018-10-14 NOTE — TOC Initial Note (Signed)
Transition of Care [facility]) - Initial/Assessment Note    Patient Details  Name: Holly Bolton MRN: [id number] Date of Birth: May 07, 1922  Transition of [facility] CM/SW Contact:    Alberteen Sam, LCSW Phone Number: 10/14/2018, 10:20 AM  Clinical Narrative:                  CSW consulted with patient regarding Home health PT recommendation, she reports previously having home health earlier this year however she is unsure the company but believes it was through [facility]. CSW confirmed patient's address in chart is accurate. She asked CSW to call her son [name].   CSW reached out to [place] who confirmed patient was previously set up with [facility] and they would like to go through [place] once again for Home Health PT. He identifies no equipment needs for patient at home, and reports when patient leaves the hospital he will be taking her home, no transport needed.   CSW has reached out to [facility], patient set up. No further needs identified.   Expected Discharge Plan: [place] Barriers to Discharge: Continued Medical Work up   Patient Goals and CMS Choice Patient states their goals for this hospitalization and ongoing recovery are:: to go home CMS Medicare.gov Compare Post Acute Care list provided to:: Patient Choice offered to / list presented to : Patient  Expected Discharge Plan and Services Expected Discharge Plan: [place] Acute Care Choice: [place] arrangements for the past 2 months: Single Family Home                                      Prior Living Arrangements/Services Living arrangements for the past 2 months: Single Family Home Lives with:: Self Patient language and need for interpreter reviewed:: Yes Do you feel safe going back to the place where you live?: Yes      Need for Family Participation in Patient Care: Yes (Comment) Care giver support system in place?: Yes (comment)   Criminal  Activity/Legal Involvement Pertinent to Current Situation/Hospitalization: No - Comment as needed  Activities of Daily Living Home Assistive Devices/Equipment: Cane (specify quad or straight)    Permission Sought/Granted Permission sought to share information with : Case Manager, Customer service manager, Family Supports    Share Information with NAME: [name]  Permission granted to share info w AGENCY: [place] granted to share info w Relationship: Son  Permission granted to share info w Contact Information: [phone]  Emotional Assessment Appearance:: Appears stated age Attitude/Demeanor/Rapport: Gracious Affect (typically observed): Calm Orientation: : Oriented to Self, Oriented to Place, Oriented to  Time, Oriented to Situation Alcohol / Substance Use: Not Applicable Psych Involvement: No (comment)  Admission diagnosis:  Acute on chronic congestive heart failure, unspecified heart failure type ([place]) [I50.9] Acute on chronic diastolic CHF (congestive heart failure) ([place]) [I50.33] Patient Active Problem List   Diagnosis Date Noted  . Acute on chronic diastolic CHF (congestive heart failure) ([place]) 10/11/2018  . Chronic kidney disease, stage 4 (severe) ([place]) 09/26/2018  . Intermittent claudication ([place]) 06/27/2018  . Mild intermittent asthma without complication 09/73/5329  . Senile osteoporosis 06/27/2018  . Bilateral bunions 03/28/2018  . Hyperthyroidism 03/20/2018  . Weight loss, unintentional 02/11/2018  . H/O hyperthyroidism 02/11/2018  . Memory loss, short term 02/11/2018  . Frailty syndrome in geriatric patient 02/11/2018  .  Urge incontinence of urine 02/11/2018  . Chronic kidney disease, stage 3 ([place]) 02/11/2018  . S/p nephrectomy 02/11/2018  . B12 deficiency 02/11/2018  . Elevated troponin   . Asthma 08/15/2017  . Open left humeral fracture 08/15/2017  . Fracture 08/15/2017  . Pacemaker battery depletion 05/17/2017  . Leg cramps  06/12/2016  . Peripheral arterial disease ([place]) 12/08/2014  . Status post ascending aortic aneurysm repair/AVR -  Medtronic Freestyle root 09/28/2014  . CAD s/p CABG (LIMA to LAD) 07/08/2013  . History of aortic root repairand bioprosthetic AVR 07/08/2013  . Hyperlipidemia 07/08/2013  . SSS (sick sinus syndrome) ([place]) 07/08/2013  . Second degree AV block 07/08/2013  . Pacemaker, dual chamber St. Jude 2003/2010/2019 07/08/2013  . Essential hypertension 07/08/2013   PCP:  Gayland Curry, DO Pharmacy:   [facility], [place] [address] Phone: [phone] Fax: [phone]     Social Determinants of Health (SDOH) Interventions    Readmission Risk Interventions No flowsheet data found.

## 2018-10-14 NOTE — Plan of Care (Signed)
  Problem: Activity: Goal: Capacity to carry out activities will improve Outcome: Adequate for Discharge   

## 2018-10-14 NOTE — Progress Notes (Signed)
PROGRESS NOTE    Holly Bolton  OZD:[id number] DOB: 06-Jun-1922 DOA: 10/11/2018 PCP: Gayland Curry, DO   Brief Narrative:  85 year old with history of second-degree AV block, sick sinus syndrome status post pacemaker, aortic aneurysm status post ascending aorta repair with bioprosthetic valve/Medtronic freestyle, coronary artery disease, essential hypertension, peripheral arterial disease, hypothyroidism came to the hospital with complaints of bilateral lower extremity swelling with dyspnea on exertion.  Chest x-ray showed bilateral pleural effusions with elevated BNP.  She was started on diuretics while in the hospital.   Assessment & Plan:   Active Problems:   Acute on chronic diastolic CHF (congestive heart failure) (HCC)   Acute on chronic diastolic congestive heart failure, ejection fraction 55%, class III Bilateral pleural effusions - Net negative since admission.  Received p.o. Lasix this morning, I will give her 1 more dose of IV Lasix later today. -Echocardiogram-55-60% ejection fraction, prosthetic valve noted. -Continue to hold olmesartan -Continue Coreg. -Chest x-ray still shows bilateral pleural effusions  Elevated troponin-demand ischemia.  Chest pain-free.  No further work-up indicated at this time.  Coronary artery disease/peripheral vascular disease -On aspirin.  Continue Coreg.  Sick sinus syndrome status post pacemaker in place Second-degree AV block - Appears to be doing well.  CKD stage III -Baseline creatinine 1.3.  Today is 1.68.  Closely monitor  Hyperthyroidism -Continue methimazole  Moderate to severe protein calorie malnutrition - Encouraged to increase her p.o. intake.  Physical therapy is recommending home health  DVT prophylaxis: Lovenox Code Status: Full code Family Communication: None at bedside Disposition Plan: Maintain inpatient hospital stay for IV diuretics today.  Hopefully we can discharge her tomorrow, she is very frail cachectic  therefore will need to closely be monitored on diuretics.  In the meantime monitor renal function as well.  Consultants:   None  Procedures:   None  Antimicrobials:   None   Subjective: Tells me overall she feels better but still having some exertional dyspnea.  Review of Systems Otherwise negative except as per HPI, including: General: Denies fever, chills, night sweats or unintended weight loss. Resp: Denies cough, wheezing Cardiac: Denies chest pain, palpitations, orthopnea, paroxysmal nocturnal dyspnea. GI: Denies abdominal pain, nausea, vomiting, diarrhea or constipation GU: Denies dysuria, frequency, hesitancy or incontinence MS: Denies muscle aches, joint pain or swelling Neuro: Denies headache, neurologic deficits (focal weakness, numbness, tingling), abnormal gait Psych: Denies anxiety, depression, SI/HI/AVH Skin: Denies new rashes or lesions ID: Denies sick contacts, exotic exposures, travel  Objective: Vitals:   10/13/18 1641 10/13/18 2047 10/14/18 0619 10/14/18 0619  BP: (!) 122/45 (!) 130/52  (!) 145/58  Pulse: 70 80  75  Resp:    18  Temp:  (!) 97.5 F (36.4 C)  97.9 F (36.6 C)  TempSrc:  Oral  Oral  SpO2: 94% 95%  95%  Weight:   41.4 kg     Intake/Output Summary (Last 24 hours) at 10/14/2018 1022 Last data filed at 10/14/2018 0900 Gross per 24 hour  Intake 720 ml  Output 425 ml  Net 295 ml   Filed Weights   10/12/18 0300 10/13/18 0429 10/14/18 0619  Weight: 45.5 kg 42.4 kg 41.4 kg    Examination:  General exam: Appears calm and comfortable, temporal wasting. Respiratory system: Bibasilar diminished breath sounds midway up the lung fields. Cardiovascular system: S1 & S2 heard, RRR. No JVD, murmurs, rubs, gallops or clicks. No pedal edema. Gastrointestinal system: Abdomen is nondistended, soft and nontender. No organomegaly or masses felt. Normal bowel  sounds heard. Central nervous system: Alert and oriented. No focal neurological deficits.  Extremities: Symmetric 4+ x 5 power. Skin: No rashes, lesions or ulcers Psychiatry: Judgement and insight appear normal. Mood & affect appropriate.     Data Reviewed:   CBC: Recent Labs  Lab 10/11/18 1432 10/11/18 2301 10/12/18 0426 10/13/18 0416 10/14/18 0543  WBC 7.4 8.4 7.2 5.1 5.2  HGB 11.5* 11.9* 11.6* 10.4* 9.7*  HCT 35.9* 37.1 35.6* 30.9* 29.1*  MCV 104.4* 104.8* 101.4* 99.0 99.7  PLT 217 82* 201 181 720   Basic Metabolic Panel: Recent Labs  Lab 10/11/18 1432 10/11/18 2301 10/12/18 0426 10/13/18 0416 10/14/18 0543  NA 141  --  141 142 139  K 4.6  --  4.2 3.5 3.5  CL 107  --  105 100 100  CO2 23  --  25 30 29   GLUCOSE 145*  --  118* 94 93  BUN 31*  --  33* 33* 36*  CREATININE 1.61* 1.66* 1.67* 1.72* 1.68*  CALCIUM 9.3  --  9.0 8.9 8.5*  MG  --  2.3  --   --   --   PHOS  --  4.2  --   --   --    GFR: Estimated Creatinine Clearance: 13.7 mL/min (A) (by C-G formula based on SCr of 1.68 mg/dL (H)). Liver Function Tests: Recent Labs  Lab 10/11/18 1715  AST 22  ALT 19  ALKPHOS 80  BILITOT 1.3*  PROT 6.9  ALBUMIN 3.8   Recent Labs  Lab 10/11/18 1715  LIPASE 51   No results for input(s): AMMONIA in the last 168 hours. Coagulation Profile: No results for input(s): INR, PROTIME in the last 168 hours. Cardiac Enzymes: No results for input(s): CKTOTAL, CKMB, CKMBINDEX, TROPONINI in the last 168 hours. BNP (last 3 results) No results for input(s): PROBNP in the last 8760 hours. HbA1C: No results for input(s): HGBA1C in the last 72 hours. CBG: No results for input(s): GLUCAP in the last 168 hours. Lipid Profile: No results for input(s): CHOL, HDL, LDLCALC, TRIG, CHOLHDL, LDLDIRECT in the last 72 hours. Thyroid Function Tests: Recent Labs    10/11/18 2301  TSH 8.881*   Anemia Panel: No results for input(s): VITAMINB12, FOLATE, FERRITIN, TIBC, IRON, RETICCTPCT in the last 72 hours. Sepsis Labs: Recent Labs  Lab 10/11/18 1902 10/11/18 2224   LATICACIDVEN 1.2 1.4    Recent Results (from the past 240 hour(s))  SARS CORONAVIRUS 2 (TAT 6-12 HRS) Nasal Swab Aptima Multi Swab     Status: None   Collection Time: 10/11/18  4:26 PM   Specimen: Aptima Multi Swab; Nasal Swab  Result Value Ref Range Status   SARS Coronavirus 2 NEGATIVE NEGATIVE Final    Comment: (NOTE) SARS-CoV-2 target nucleic acids are NOT DETECTED. The SARS-CoV-2 RNA is generally detectable in upper and lower respiratory specimens during the acute phase of infection. Negative results do not preclude SARS-CoV-2 infection, do not rule out co-infections with other pathogens, and should not be used as the sole basis for treatment or other patient management decisions. Negative results must be combined with clinical observations, patient history, and epidemiological information. The expected result is Negative. Fact Sheet for Patients: SugarRoll.be Fact Sheet for Healthcare Providers: https://www.woods-mathews.com/ This test is not yet approved or cleared by the [place] FDA and  has been authorized for detection and/or diagnosis of SARS-CoV-2 by FDA under an Emergency Use Authorization (EUA). This EUA will remain  in effect (meaning this test can  be used) for the duration of the COVID-19 declaration under Section 56 4(b)(1) of the Act, 21 U.S.C. section 360bbb-3(b)(1), unless the authorization is terminated or revoked sooner. Performed at [facility], [place] [address], [place], [place] [address]          Radiology Studies: Dg Chest Port 1 View  Result Date: 10/14/2018 CLINICAL DATA:  Shortness of breath EXAM: PORTABLE CHEST 1 VIEW COMPARISON:  October 11, 2018 FINDINGS: Again noted is mild cardiomegaly. Small bilateral pleural effusions are seen, right greater than left which have slightly increased since the prior exam. Mildly increased interstitial markings throughout both lungs. Median sternotomy wires  are present. A right-sided pacemaker is seen. Surgical clips in the right upper quadrant. The patient is status post ORIF of the left humerus. IMPRESSION: Cardiomegaly and small bilateral pleural effusions, right greater than left. This is slightly increased since the prior exam. Interstitial edema. Electronically Signed   By: Prudencio Pair M.D.   On: 10/14/2018 09:51        Scheduled Meds: . aspirin EC  81 mg Oral Daily  . brimonidine  1 drop Both Eyes BID  . carvedilol  3.125 mg Oral BID WC  . cholecalciferol  2,000 Units Oral Daily  . enoxaparin (LOVENOX) injection  30 mg Subcutaneous Q24H  . furosemide  40 mg Intravenous Once  . furosemide  20 mg Oral Daily  . methimazole  5 mg Oral Daily  . sodium chloride flush  3 mL Intravenous Once  . vitamin B-12  500 mcg Oral Daily   Continuous Infusions:   LOS: 2 days   Time spent= 35 mins    Dorien Mayotte Arsenio Loader, MD [facility]  If 7PM-7AM, please contact night-coverage [email or web address] 10/14/2018, 10:22 AM

## 2018-10-14 NOTE — Progress Notes (Signed)
Physical Therapy Treatment Patient Details Name: Holly Bolton MRN: [id number] DOB: January 12, 1923 Today's Date: 10/14/2018    History of Present Illness y.o. F with hx HTN, SSS with pacer, history of AV repair, pig valve as well as LIMA to LAD not for atherosclerosis, chronic normocytic anemia and CKD III who presented with 1 week leg swelling, DOB and DOE.    PT Comments    Pt supine on arrival with son present. Pt stating she feels off today and can't get her mind straight. Pt with cues needed to manage briefs for toileting and required bil UE support for gait despite her baseline cane use. Pt and son educated for current need for RW with all ambulation to increase tolerance and safety. Pt educated for HEP and encouraged to continue to perform as well as OOB to [facility] for toileting.   SPo2 96% on RA with all mobility    Follow Up Recommendations  Home health PT;Supervision/Assistance - 24 hour     Equipment Recommendations  None recommended by PT    Recommendations for Other Services       Precautions / Restrictions Precautions Precautions: Fall    Mobility  Bed Mobility Overal bed mobility: Needs Assistance Bed Mobility: Supine to Sit     Supine to sit: Min assist     General bed mobility comments: HOB 15 degrees with use of rail and HHA with increased time and effort  Transfers Overall transfer level: Needs assistance   Transfers: Sit to/from Stand;Stand Pivot Transfers Sit to Stand: Min assist Stand pivot transfers: Min assist       General transfer comment: cues for hand placement, assist to power up, use of cane to pivot from bed to [facility]  Ambulation/Gait Ambulation/Gait assistance: Min assist Gait Distance (Feet): 15 Feet Assistive device: Straight cane;1 person hand held assist Gait Pattern/deviations: Step-through pattern;Decreased stride length;Trunk flexed   Gait velocity interpretation: 1.31 - 2.62 ft/sec, indicative of limited community ambulator General  Gait Details: pt with trunk flexed and reaching for additional environmental support with use of cane and HHA. pt initially declined RW use but end of session agreeable to utilizing RW for all further acute gait. pt declined further distance due to fatigue   Stairs             Wheelchair Mobility    Modified Rankin (Stroke Patients Only)       Balance Overall balance assessment: Needs assistance Sitting-balance support: No upper extremity supported;Feet supported Sitting balance-Leahy Scale: Fair     Standing balance support: Bilateral upper extremity supported;During functional activity Standing balance-Leahy Scale: Poor Standing balance comment: reliant on external support                            Cognition Arousal/Alertness: Awake/alert Behavior During Therapy: WFL for tasks assessed/performed Overall Cognitive Status: Impaired/Different from baseline Area of Impairment: Memory                     Memory: Decreased short-term memory         General Comments: pt having difficulty recalling her age and stating "i'm not with it"      Exercises General Exercises - Lower Extremity Long Arc Quad: AROM;Both;Seated;15 reps Hip ABduction/ADduction: AROM;Both;15 reps;Seated Hip Flexion/Marching: AROM;Both;15 reps;Seated    General Comments        Pertinent Vitals/Pain Pain Assessment: No/denies pain    Home Living  Prior Function            PT Goals (current goals can now be found in the care plan section) Progress towards PT goals: Progressing toward goals    Frequency    Min 3X/week      PT Plan Current plan remains appropriate    Co-evaluation              AM-PAC PT "6 Clicks" Mobility   Outcome Measure  Help needed turning from your back to your side while in a flat bed without using bedrails?: A Little Help needed moving from lying on your back to sitting on the side of a flat bed  without using bedrails?: A Little Help needed moving to and from a bed to a chair (including a wheelchair)?: A Little Help needed standing up from a chair using your arms (e.g., wheelchair or bedside chair)?: A Little Help needed to walk in hospital room?: A Little Help needed climbing 3-5 steps with a railing? : A Lot 6 Click Score: 17    End of Session   Activity Tolerance: Patient limited by fatigue Patient left: in chair;with call bell/phone within reach;with family/visitor present Nurse Communication: Mobility status PT Visit Diagnosis: Other abnormalities of gait and mobility (R26.89);Muscle weakness (generalized) (M62.81)     Time: 7116-5790 PT Time Calculation (min) (ACUTE ONLY): 19 min  Charges:  $Gait Training: 8-22 mins                     [place], PT Acute Rehabilitation Services Pager: [phone] Office: [place] 10/14/2018, 1:36 PM

## 2018-10-15 DIAGNOSIS — N179 Acute kidney failure, unspecified: Secondary | ICD-10-CM

## 2018-10-15 LAB — BASIC METABOLIC PANEL
Anion gap: 10 (ref 5–15)
BUN: 36 mg/dL — ABNORMAL HIGH (ref 8–23)
CO2: 30 mmol/L (ref 22–32)
Calcium: 9 mg/dL (ref 8.9–10.3)
Chloride: 98 mmol/L (ref 98–111)
Creatinine, Ser: 1.71 mg/dL — ABNORMAL HIGH (ref 0.44–1.00)
GFR calc Af Amer: 29 mL/min — ABNORMAL LOW (ref >60)
GFR calc non Af Amer: 25 mL/min — ABNORMAL LOW (ref >60)
Glucose, Bld: 105 mg/dL — ABNORMAL HIGH (ref 70–99)
Potassium: 3.8 mmol/L (ref 3.5–5.1)
Sodium: 138 mmol/L (ref 135–145)

## 2018-10-15 LAB — MAGNESIUM: Magnesium: 2 mg/dL (ref 1.7–2.4)

## 2018-10-15 NOTE — Progress Notes (Signed)
Discharge instructions given to patient and son at bedside no concerns voiced. PIV removed with catheter intact whole clean dry dressing applied.

## 2018-10-15 NOTE — Care Management Important Message (Signed)
Important Message  Patient Details  Name: Holly Bolton MRN: [id number] Date of Birth: [birth year]   Medicare Important Message Given:  Yes     Shelda Altes 10/15/2018, 12:01 PM

## 2018-10-15 NOTE — Evaluation (Signed)
Occupational Therapy Evaluation Patient Details Name: Holly Bolton MRN: [id number] DOB: 1922/08/02 Today's Date: 10/15/2018    History of Present Illness 85y.o. F with hx HTN, SSS with pacer, history of AV repair, pig valve as well as LIMA to LAD not for atherosclerosis, chronic normocytic anemia and CKD III who presented with 1 week leg swelling, DOB and DOE.   Clinical Impression   PTA patient reports independent with dressing but requires assist for shower transfers, using cane for mobility. Admitted for above and limited by problem list below, including generalized weakness, impaired balance, decreased activity tolerance and impaired cognition.  She requires min assist for transfers and LB self care, min assist for toileting, and min assist for in room mobility using cane. Cognitively, she requires cueing for safety awareness and problem solving; pt reports decreased short term memory recently.  Patient will benefit from continued OT services while admitted and after dc at [facility] level in order to optimize independence and safety with ADLs/mobility.  She will require initial 24/7 assistance for safety.       Follow Up Recommendations  Home health OT;Supervision/Assistance - 24 hour    Equipment Recommendations  None recommended by OT    Recommendations for Other Services PT consult     Precautions / Restrictions Precautions Precautions: Fall Restrictions Weight Bearing Restrictions: No      Mobility Bed Mobility Overal bed mobility: Needs Assistance Bed Mobility: Supine to Sit     Supine to sit: Min guard     General bed mobility comments: min guard for safety, increased time required  Transfers Overall transfer level: Needs assistance Equipment used: Rolling walker (2 wheeled) Transfers: Sit to/from [facility] Sit to Stand: Min assist Stand pivot transfers: Min assist       General transfer comment: cueing for hand placement and safety, blocking B  feet from slipping forward and cueing for technique; decreased control of descent to sitting    Balance Overall balance assessment: Needs assistance Sitting-balance support: No upper extremity supported;Feet supported Sitting balance-Leahy Scale: Fair     Standing balance support: Bilateral upper extremity supported;During functional activity Standing balance-Leahy Scale: Poor Standing balance comment: reliant on external support                           ADL either performed or assessed with clinical judgement   ADL Overall ADL's : Needs assistance/impaired     Grooming: Supervision/safety;Set up;Sitting   Upper Body Bathing: Set up;Supervision/ safety;Sitting   Lower Body Bathing: Minimal assistance;Sit to/from stand   Upper Body Dressing : Set up;Sitting   Lower Body Dressing: Minimal assistance;Sit to/from stand Lower Body Dressing Details (indicate cue type and reason): min assist sit <>stand  Toilet Transfer: Minimal assistance;Ambulation;BSC(cane)   Toileting- Clothing Manipulation and Hygiene: Minimal assistance;Sit to/from stand Toileting - Clothing Manipulation Details (indicate cue type and reason): for clothing mgmt      Functional mobility during ADLs: Minimal assistance;Min guard;Cane General ADL Comments: pt limited by generalized weakness, impaired balance and impaired cognition     Vision Baseline Vision/History: Wears glasses Wears Glasses: At all times Patient Visual Report: No change from baseline Vision Assessment?: No apparent visual deficits     Perception     Praxis      Pertinent Vitals/Pain Pain Assessment: No/denies pain     Hand Dominance Right   Extremity/Trunk Assessment Upper Extremity Assessment Upper Extremity Assessment: Generalized weakness   Lower Extremity Assessment Lower Extremity  Assessment: Defer to PT evaluation   Cervical / Trunk Assessment Cervical / Trunk Assessment: Kyphotic   Communication  Communication Communication: No difficulties   Cognition Arousal/Alertness: Awake/alert Behavior During Therapy: WFL for tasks assessed/performed Overall Cognitive Status: Impaired/Different from baseline Area of Impairment: Memory;Problem solving;Safety/judgement;Awareness                     Memory: Decreased short-term memory   Safety/Judgement: Decreased awareness of safety;Decreased awareness of deficits Awareness: Emergent Problem Solving: Difficulty sequencing;Requires verbal cues;Slow processing General Comments: pt with decreased STM, poor problem sovling and safety awareness; difficulty remembering need for assist with mobility   General Comments  VSS, RA     Exercises     Shoulder Instructions      Home Living Family/patient expects to be discharged to:: Private residence Living Arrangements: Children(son) Available Help at Discharge: Family;Available PRN/intermittently Type of Home: House Home Access: Stairs to enter [employer] of Steps: 2 Entrance Stairs-Rails: None Home Layout: One level     Bathroom Shower/Tub: Teacher, early years/pre: Standard     Home Equipment: Cane - single point;Walker - 2 wheels;Shower seat          Prior Functioning/Environment Level of Independence: Independent with assistive device(s)        Comments: independent ADLs, uses cane for mobility        OT Problem List: Decreased strength;Decreased activity tolerance;Impaired balance (sitting and/or standing);Decreased knowledge of precautions;Decreased knowledge of use of DME or AE;Decreased safety awareness;Decreased cognition      OT Treatment/Interventions: Self-care/ADL training;DME and/or AE instruction;Therapeutic activities;Cognitive remediation/compensation;Patient/family education;Balance training    OT Goals(Current goals can be found in the care plan section) Acute Rehab OT Goals Patient Stated Goal: to feel better OT Goal  Formulation: With patient Time For Goal Achievement: 10/29/18 Potential to Achieve Goals: Good  OT Frequency: Min 2X/week   Barriers to D/C:            Co-evaluation              AM-PAC OT "6 Clicks" Daily Activity     Outcome Measure Help from another person eating meals?: A Little Help from another person taking care of personal grooming?: A Little Help from another person toileting, which includes using toliet, bedpan, or urinal?: A Little Help from another person bathing (including washing, rinsing, drying)?: A Little Help from another person to put on and taking off regular upper body clothing?: A Little Help from another person to put on and taking off regular lower body clothing?: A Little 6 Click Score: 18   End of Session Equipment Utilized During Treatment: Other (comment)(cane) Nurse Communication: Mobility status  Activity Tolerance: Patient tolerated treatment well Patient left: in chair;with call bell/phone within reach;with chair alarm set  OT Visit Diagnosis: Unsteadiness on feet (R26.81);Muscle weakness (generalized) (M62.81)                Time: 0263-7858 OT Time Calculation (min): 24 min Charges:  OT General Charges $OT Visit: 1 Visit OT Evaluation $OT Eval Moderate Complexity: 1 Mod OT Treatments $Self Care/Home Management : 8-22 mins  Delight Stare, OT Acute Rehabilitation Services Pager [phone] Office [phone]   Delight Stare 10/15/2018, 8:58 AM

## 2018-10-15 NOTE — Discharge Summary (Signed)
Physician Discharge Summary  Holly Bolton DPO:[id number] DOB: Jun 05, 1922 DOA: 10/11/2018  PCP: Gayland Curry, DO  Admit date: 10/11/2018 Discharge date: 10/15/2018  Admitted From: Home Disposition: Home with home health  Recommendations for Outpatient Follow-up:  1. Follow up with PCP in 1-2 weeks 2. Please obtain BMP/CBC in one week your next doctors visit.  3. Advised to take Lasix as prescribed, can take an additional dose if it becomes necessary if she is noticing increasing weight gain and shortness of breath. 4. Follow-up outpatient cardiology in 2 weeks  Home Health: Arrangements made per case manager Discharge Condition: Stable CODE STATUS: Full code Diet recommendation: 2 g salt diet with 1800 cc fluid restriction  Brief/Interim Summary: 85 year old with history of second-degree AV block, sick sinus syndrome status post pacemaker, aortic aneurysm status post ascending aorta repair with bioprosthetic valve/[employer] freestyle, coronary artery disease, essential hypertension, peripheral arterial disease, hypothyroidism came to the hospital with complaints of bilateral lower extremity swelling with dyspnea on exertion.  Chest x-ray showed bilateral pleural effusions with elevated BNP.  She was started on diuretics while in the hospital. Patient did well with the diuretics center.  She was transitioned to oral diuretics.  Her breathing was at baseline.  She was advised to follow strict fluid intake and diet restriction given her effusions and high risk of going back into heart failure.  She should also follow-up with outpatient heart failure clinic.  Advised to hold olmesartan for at least 1 week until outpatient labs are done to ensure renal function is remained stable and then resume as necessary and further instructed by outpatient PCP.  Called patient's son but he did not answer.  No voicemail set up to leave a voicemail.   Discharge Diagnoses:  Active Problems:   Acute on  chronic diastolic CHF (congestive heart failure) (HCC)   Acute on chronic diastolic congestive heart failure, ejection fraction 55%, class II Bilateral pleural effusions -  Position to home regimen of oral Lasix.  She is on room air saturating greater than 95% -Echocardiogram-55-60% ejection fraction, prosthetic valve noted. -Continue to hold olmesartan for 1 week and then resume -Continue Coreg. -Chest x-ray still shows bilateral pleural effusions  Elevated troponin-demand ischemia.  Chest pain-free.  No further work-up indicated at this time.  Coronary artery disease/peripheral vascular disease -On aspirin.  Continue Coreg.  Sick sinus syndrome status post pacemaker in place Second-degree AV block - Appears to be doing well.  CKD stage III -Baseline creatinine 1.3.    Today's 1.7.  Advised to continue holding olmesartan for 1 week until instructed by outpatient PCP to safely resume this  Hyperthyroidism -Continue methimazole  Moderate to severe protein calorie malnutrition - Encouraged to increase her p.o. intake.  Risk for readmission in case if she remains noncompliant with her diet and fluid intake.  She is also quite frail.  Consultations:  None  Subjective: Feels much better. Wants to go home.   Discharge Exam: Vitals:   10/15/18 0437 10/15/18 0628  BP: (!) 146/64 (!) 129/59  Pulse: 63 76  Resp: 18   Temp: 97.7 F (36.5 C)   SpO2: 97%    Vitals:   10/14/18 1634 10/14/18 1934 10/15/18 0437 10/15/18 0628  BP: (!) 128/59 (!) 127/50 (!) 146/64 (!) 129/59  Pulse: 75 65 63 76  Resp:  20 18   Temp:  98 F (36.7 C) 97.7 F (36.5 C)   TempSrc:  Oral Oral   SpO2:  97% 97%   Weight:  38.7 kg     General: Pt is alert, awake, not in acute distress Cardiovascular: RRR, S1/S2 +, no rubs, no gallops Respiratory: Bibasilar crackles Abdominal: Soft, NT, ND, bowel sounds + Extremities: no edema, no cyanosis Kyphotic but does not appear in any distress.   She is on room air  Discharge Instructions  Discharge Instructions    AMB referral to CHF clinic   Complete by: As directed    Call MD for:  difficulty breathing, headache or visual disturbances   Complete by: As directed    Diet - low sodium heart healthy   Complete by: As directed    Discharge instructions   Complete by: As directed    You were cared for by a hospitalist during your hospital stay. If you have any questions about your discharge medications or the care you received while you were in the hospital after you are discharged, you can call the unit and asked to speak with the hospitalist on call if the hospitalist that took care of you is not available. Once you are discharged, your primary care physician will handle any further medical issues. Please note that NO REFILLS for any discharge medications will be authorized once you are discharged, as it is imperative that you return to your primary care physician (or establish a relationship with a primary care physician if you do not have one) for your aftercare needs so that they can reassess your need for medications and monitor your lab values.  Please request your Prim.MD to go over all Hospital Tests and Procedure/Radiological results at the follow up, please get all Hospital records sent to your Prim MD by signing hospital release before you go home.  Get CBC, CMP, 2 view Chest X ray checked  by Primary MD during your next visit or SNF MD in 5-7 days ( we routinely change or add medications that can affect your baseline labs and fluid status, therefore we recommend that you get the mentioned basic workup next visit with your PCP, your PCP may decide not to get them or add new tests based on their clinical decision)  On your next visit with your primary care physician please Get Medicines reviewed and adjusted.  If you experience worsening of your admission symptoms, develop shortness of breath, life threatening emergency, suicidal  or homicidal thoughts you must seek medical attention immediately by calling 911 or calling your MD immediately  if symptoms less severe.  You Must read complete instructions/literature along with all the possible adverse reactions/side effects for all the Medicines you take and that have been prescribed to you. Take any new Medicines after you have completely understood and accpet all the possible adverse reactions/side effects.   Do not drive, operate heavy machinery, perform activities at heights, swimming or participation in water activities or provide baby sitting services if your were admitted for syncope or siezures until you have seen by Primary MD or a Neurologist and advised to do so again.  Do not drive when taking Pain medications.   Increase activity slowly   Complete by: As directed      Allergies as of 10/15/2018      Reactions   Accupril [quinapril Hcl] Other (See Comments)   Unknown reaction    Amlodipine Swelling   Benadryl [diphenhydramine Hcl] Other (See Comments)   Unknown reaction    Biaxin [clarithromycin] Other (See Comments)   Unknown reaction    Ciprofloxacin Other (See Comments)   Unknown reaction  Codeine Other (See Comments)   Unknown reaction    Diovan [valsartan] Other (See Comments)   Unknown reaction    Medrol [methylprednisolone] Other (See Comments)   Unknown   Morphine And Related Other (See Comments)   Unknown reaction    Neurontin [gabapentin] Other (See Comments)   Unknown reaction    Penicillins    Unknown reaction  Has patient had a PCN reaction causing immediate rash, facial/tongue/throat swelling, SOB or lightheadedness with hypotension: Unknown Has patient had a PCN reaction causing severe rash involving mucus membranes or skin necrosis: Unknown Has patient had a PCN reaction that required hospitalization: Unknown Has patient had a PCN reaction occurring within the last 10 years: No If all of the above answers are "NO", then may  proceed with Cephalosporin use.      Medication List    TAKE these medications   acetaminophen 500 MG tablet Commonly known as: TYLENOL Take 500 mg by mouth every 6 (six) hours as needed for mild pain or headache.   albuterol 0.63 MG/3ML nebulizer solution Commonly known as: ACCUNEB Take 1 ampule by nebulization every 6 (six) hours as needed for wheezing.   aspirin 325 MG tablet Take 81 mg by mouth daily.   brimonidine 0.2 % ophthalmic solution Commonly known as: ALPHAGAN Place 1 drop into both eyes 2 (two) times daily.   carvedilol 3.125 MG tablet Commonly known as: COREG Take 1 tablet (3.125 mg total) by mouth 2 (two) times daily with a meal.   CoQ10 200 MG Caps Take 200 mg by mouth daily.   furosemide 20 MG tablet Commonly known as: LASIX TAKE ONE TABLET (20 MG) BY MOUTH TWICE A WEEK. mondays AND fridays What changed: See the new instructions.   methimazole 5 MG tablet Commonly known as: TAPAZOLE Take 5 mg by mouth daily.   olmesartan 40 MG tablet Commonly known as: BENICAR Take 1 tablet (40 mg total) by mouth daily.   vitamin B-12 500 MCG tablet Commonly known as: CYANOCOBALAMIN Take 500 mcg by mouth daily.   Vitamin D 50 MCG (2000 UT) Caps Take 1 capsule by mouth daily.      Follow-up Information    [facility] Follow [facility].   Specialty: Home Health Services Why: [facility] will call to schedule PT and OT therapy services. If you do not hear from them, they can be reached at [phone].        Reed, Tiffany L, DO. Go on 10/24/2018.   Specialty: Geriatric Medicine Why: @8 :00am Contact information: [address]. [place] [address] [phone]          Allergies  Allergen Reactions  . Accupril [Quinapril Hcl] Other (See Comments)    Unknown reaction   . Amlodipine Swelling  . Benadryl [Diphenhydramine Hcl] Other (See Comments)    Unknown reaction   . Biaxin [Clarithromycin] Other (See Comments)    Unknown  reaction   . Ciprofloxacin Other (See Comments)    Unknown reaction   . Codeine Other (See Comments)    Unknown reaction   . Diovan [Valsartan] Other (See Comments)    Unknown reaction   . Medrol [Methylprednisolone] Other (See Comments)    Unknown  . Morphine And Related Other (See Comments)    Unknown reaction   . Neurontin [Gabapentin] Other (See Comments)    Unknown reaction   . Penicillins     Unknown reaction  Has patient had a PCN reaction causing immediate rash, facial/tongue/throat swelling, SOB  or lightheadedness with hypotension: Unknown Has patient had a PCN reaction causing severe rash involving mucus membranes or skin necrosis: Unknown Has patient had a PCN reaction that required hospitalization: Unknown Has patient had a PCN reaction occurring within the last 10 years: No If all of the above answers are "NO", then may proceed with Cephalosporin use.     You were cared for by a hospitalist during your hospital stay. If you have any questions about your discharge medications or the care you received while you were in the hospital after you are discharged, you can call the unit and asked to speak with the hospitalist on call if the hospitalist that took care of you is not available. Once you are discharged, your primary care physician will handle any further medical issues. Please note that no refills for any discharge medications will be authorized once you are discharged, as it is imperative that you return to your primary care physician (or establish a relationship with a primary care physician if you do not have one) for your aftercare needs so that they can reassess your need for medications and monitor your lab values.   Procedures/Studies: Dg Chest 2 View  Result Date: 10/11/2018 CLINICAL DATA:  Shortness of breath EXAM: CHEST - 2 VIEW COMPARISON:  02/11/2018 FINDINGS: Cardiomegaly. Prior median sternotomy. Moderate left pleural effusion and small right pleural  effusion. Bibasilar atelectasis. Right pacer is unchanged. IMPRESSION: Bilateral effusions and bibasilar atelectasis, left greater than right. Mild cardiomegaly. Electronically Signed   By: Rolm Baptise M.D.   On: 10/11/2018 15:12   Dg Chest Port 1 View  Result Date: 10/14/2018 CLINICAL DATA:  Shortness of breath EXAM: PORTABLE CHEST 1 VIEW COMPARISON:  October 11, 2018 FINDINGS: Again noted is mild cardiomegaly. Small bilateral pleural effusions are seen, right greater than left which have slightly increased since the prior exam. Mildly increased interstitial markings throughout both lungs. Median sternotomy wires are present. A right-sided pacemaker is seen. Surgical clips in the right upper quadrant. The patient is status post ORIF of the left humerus. IMPRESSION: Cardiomegaly and small bilateral pleural effusions, right greater than left. This is slightly increased since the prior exam. Interstitial edema. Electronically Signed   By: Prudencio Pair M.D.   On: 10/14/2018 09:51      The results of significant diagnostics from this hospitalization (including imaging, microbiology, ancillary and laboratory) are listed below for reference.     Microbiology: Recent Results (from the past 240 hour(s))  SARS CORONAVIRUS 2 (TAT 6-12 HRS) Nasal Swab Aptima Multi Swab     Status: None   Collection Time: 10/11/18  4:26 PM   Specimen: Aptima Multi Swab; Nasal Swab  Result Value Ref Range Status   SARS Coronavirus 2 NEGATIVE NEGATIVE Final    Comment: (NOTE) SARS-CoV-2 target nucleic acids are NOT DETECTED. The SARS-CoV-2 RNA is generally detectable in upper and lower respiratory specimens during the acute phase of infection. Negative results do not preclude SARS-CoV-2 infection, do not rule out co-infections with other pathogens, and should not be used as the sole basis for treatment or other patient management decisions. Negative results must be combined with clinical observations, patient history,  and epidemiological information. The expected result is Negative. Fact Sheet for Patients: SugarRoll.be Fact Sheet for Healthcare Providers: https://www.woods-mathews.com/ This test is not yet approved or cleared by the [place] FDA and  has been authorized for detection and/or diagnosis of SARS-CoV-2 by FDA under an Emergency Use Authorization (EUA). This EUA will remain  in effect (meaning this test can be used) for the duration of the COVID-19 declaration under Section 56 4(b)(1) of the Act, 21 U.S.C. section 360bbb-3(b)(1), unless the authorization is terminated or revoked sooner. Performed at [facility], [place] [address] [place], [place] [address]      Labs: BNP (last 3 results) Recent Labs    10/11/18 1500 10/14/18 0543  BNP 2,821.5* 710.6*   Basic Metabolic Panel: Recent Labs  Lab 10/11/18 1432 10/11/18 2301 10/12/18 0426 10/13/18 0416 10/14/18 0543 10/15/18 0504  NA 141  --  141 142 139 138  K 4.6  --  4.2 3.5 3.5 3.8  CL 107  --  105 100 100 98  CO2 23  --  25 30 29 30   GLUCOSE 145*  --  118* 94 93 105*  BUN 31*  --  33* 33* 36* 36*  CREATININE 1.61* 1.66* 1.67* 1.72* 1.68* 1.71*  CALCIUM 9.3  --  9.0 8.9 8.5* 9.0  MG  --  2.3  --   --   --  2.0  PHOS  --  4.2  --   --   --   --    Liver Function Tests: Recent Labs  Lab 10/11/18 1715  AST 22  ALT 19  ALKPHOS 80  BILITOT 1.3*  PROT 6.9  ALBUMIN 3.8   Recent Labs  Lab 10/11/18 1715  LIPASE 51   No results for input(s): AMMONIA in the last 168 hours. CBC: Recent Labs  Lab 10/11/18 1432 10/11/18 2301 10/12/18 0426 10/13/18 0416 10/14/18 0543  WBC 7.4 8.4 7.2 5.1 5.2  HGB 11.5* 11.9* 11.6* 10.4* 9.7*  HCT 35.9* 37.1 35.6* 30.9* 29.1*  MCV 104.4* 104.8* 101.4* 99.0 99.7  PLT 217 82* 201 181 177   Cardiac Enzymes: No results for input(s): CKTOTAL, CKMB, CKMBINDEX, TROPONINI in the last 168 hours. BNP: Invalid input(s):  POCBNP CBG: No results for input(s): GLUCAP in the last 168 hours. D-Dimer No results for input(s): DDIMER in the last 72 hours. Hgb A1c No results for input(s): HGBA1C in the last 72 hours. Lipid Profile No results for input(s): CHOL, HDL, LDLCALC, TRIG, CHOLHDL, LDLDIRECT in the last 72 hours. Thyroid function studies No results for input(s): TSH, T4TOTAL, T3FREE, THYROIDAB in the last 72 hours.  Invalid input(s): FREET3 Anemia work up No results for input(s): VITAMINB12, FOLATE, FERRITIN, TIBC, IRON, RETICCTPCT in the last 72 hours. Urinalysis    Component Value Date/Time   COLORURINE YELLOW 08/14/2017 0121   APPEARANCEUR CLEAR 08/14/2017 0121   LABSPEC 1.009 08/14/2017 0121   PHURINE 5.0 08/14/2017 0121   GLUCOSEU NEGATIVE 08/14/2017 0121   HGBUR NEGATIVE 08/14/2017 0121   BILIRUBINUR NEGATIVE 08/14/2017 0121   KETONESUR NEGATIVE 08/14/2017 0121   PROTEINUR NEGATIVE 08/14/2017 0121   NITRITE NEGATIVE 08/14/2017 0121   LEUKOCYTESUR NEGATIVE 08/14/2017 0121   Sepsis Labs Invalid input(s): PROCALCITONIN,  WBC,  LACTICIDVEN Microbiology Recent Results (from the past 240 hour(s))  SARS CORONAVIRUS 2 (TAT 6-12 HRS) Nasal Swab Aptima Multi Swab     Status: None   Collection Time: 10/11/18  4:26 PM   Specimen: Aptima Multi Swab; Nasal Swab  Result Value Ref Range Status   SARS Coronavirus 2 NEGATIVE NEGATIVE Final    Comment: (NOTE) SARS-CoV-2 target nucleic acids are NOT DETECTED. The SARS-CoV-2 RNA is generally detectable in upper and lower respiratory specimens during the acute phase of infection. Negative results do not preclude SARS-CoV-2 infection, do not rule out co-infections with other  pathogens, and should not be used as the sole basis for treatment or other patient management decisions. Negative results must be combined with clinical observations, patient history, and epidemiological information. The expected result is Negative. Fact Sheet for  Patients: SugarRoll.be Fact Sheet for Healthcare Providers: https://www.woods-mathews.com/ This test is not yet approved or cleared by the [place] FDA and  has been authorized for detection and/or diagnosis of SARS-CoV-2 by FDA under an Emergency Use Authorization (EUA). This EUA will remain  in effect (meaning this test can be used) for the duration of the COVID-19 declaration under Section 56 4(b)(1) of the Act, 21 U.S.C. section 360bbb-3(b)(1), unless the authorization is terminated or revoked sooner. Performed at [facility], [place] [address] [place], [place] [address]      Time coordinating discharge:  I have spent 35 minutes face to face with the patient and on the ward discussing the patients care, assessment, plan and disposition with other care givers. >50% of the time was devoted counseling the patient about the risks and benefits of treatment/Discharge disposition and coordinating care.   SIGNED:   Damita Lack, MD  [facility] 10/15/2018, 8:45 AM   If 7PM-7AM, please contact night-coverage [email or web address]

## 2018-10-15 NOTE — Progress Notes (Signed)
PT Cancellation Note  Patient Details Name: Holly Bolton MRN: [id number] DOB: May 11, 1922   Cancelled Treatment:    Reason Eval/Treat Not Completed: Patient declined, no reason specified(pt preparing for D/C and politely declined therapy at this time. Son and pt encouraged to use RW)   Ercell Perlman B Lori Liew 10/15/2018, 10:20 AM Elwyn Reach, PT Acute Rehabilitation Services Pager: [phone] Office: [phone]

## 2018-10-16 ENCOUNTER — Telehealth: Payer: Self-pay | Admitting: *Deleted

## 2018-10-16 DIAGNOSIS — I5033 Acute on chronic diastolic (congestive) heart failure: Secondary | ICD-10-CM | POA: Diagnosis not present

## 2018-10-16 DIAGNOSIS — J4520 Mild intermittent asthma, uncomplicated: Secondary | ICD-10-CM | POA: Diagnosis not present

## 2018-10-16 DIAGNOSIS — E785 Hyperlipidemia, unspecified: Secondary | ICD-10-CM | POA: Diagnosis not present

## 2018-10-16 DIAGNOSIS — G3184 Mild cognitive impairment, so stated: Secondary | ICD-10-CM | POA: Diagnosis not present

## 2018-10-16 DIAGNOSIS — Z950 Presence of cardiac pacemaker: Secondary | ICD-10-CM | POA: Diagnosis not present

## 2018-10-16 DIAGNOSIS — I441 Atrioventricular block, second degree: Secondary | ICD-10-CM

## 2018-10-16 DIAGNOSIS — Z905 Acquired absence of kidney: Secondary | ICD-10-CM | POA: Diagnosis not present

## 2018-10-16 DIAGNOSIS — I130 Hypertensive heart and chronic kidney disease with heart failure and stage 1 through stage 4 chronic kidney disease, or unspecified chronic kidney disease: Secondary | ICD-10-CM | POA: Diagnosis not present

## 2018-10-16 DIAGNOSIS — Z953 Presence of xenogenic heart valve: Secondary | ICD-10-CM | POA: Diagnosis not present

## 2018-10-16 DIAGNOSIS — Z7982 Long term (current) use of aspirin: Secondary | ICD-10-CM | POA: Diagnosis not present

## 2018-10-16 DIAGNOSIS — I6523 Occlusion and stenosis of bilateral carotid arteries: Secondary | ICD-10-CM | POA: Diagnosis not present

## 2018-10-16 DIAGNOSIS — N183 Chronic kidney disease, stage 3 (moderate): Secondary | ICD-10-CM | POA: Diagnosis not present

## 2018-10-16 DIAGNOSIS — E0590 Thyrotoxicosis, unspecified without thyrotoxic crisis or storm: Secondary | ICD-10-CM | POA: Diagnosis not present

## 2018-10-16 DIAGNOSIS — I739 Peripheral vascular disease, unspecified: Secondary | ICD-10-CM

## 2018-10-16 DIAGNOSIS — E440 Moderate protein-calorie malnutrition: Secondary | ICD-10-CM | POA: Diagnosis not present

## 2018-10-16 DIAGNOSIS — Z951 Presence of aortocoronary bypass graft: Secondary | ICD-10-CM | POA: Diagnosis not present

## 2018-10-16 DIAGNOSIS — I2510 Atherosclerotic heart disease of native coronary artery without angina pectoris: Secondary | ICD-10-CM | POA: Diagnosis not present

## 2018-10-16 DIAGNOSIS — I495 Sick sinus syndrome: Secondary | ICD-10-CM

## 2018-10-16 DIAGNOSIS — Z8781 Personal history of (healed) traumatic fracture: Secondary | ICD-10-CM | POA: Diagnosis not present

## 2018-10-16 NOTE — Telephone Encounter (Signed)
Holly Bolton with [facility] called requesting verbal orders for PT 1x2wks,2x3wks,1x4wks. Verbal orders given.

## 2018-10-16 NOTE — Telephone Encounter (Signed)
Transition Care Management Follow-up Telephone Call  Date of discharge and from where: 10/15/2018 [facility]  How have you been since you were released from the hospital? Much Better  Any questions or concerns? No   Items Reviewed:  Did the pt receive and understand the discharge instructions provided? Yes   Medications obtained and verified? Yes   Any new allergies since your discharge? No   Dietary orders reviewed? Yes  Do you have support at home? Yes Son  Other (ie: DME, Home Health, etc) Home Health  Functional Questionnaire: (I = Independent and D = Dependent) ADL's: I with assistance  Bathing/Dressing- I with assistance   Meal Prep- I  Eating- I  Maintaining continence- I  Transferring/Ambulation- I with assistance  Managing Meds- I with some assistance   Follow up appointments reviewed:    PCP Hospital f/u appt confirmed? Yes  Scheduled to see Dr Mariea Clonts on 9/10 @ 8.  [facility] f/u appt confirmed? NO .  Are transportation arrangements needed? No   If their condition worsens, is the pt aware to call  their PCP or go to the ED? Yes  Was the patient provided with contact information for the PCP's office or ED? Yes  Was the pt encouraged to call back with questions or concerns? Yes

## 2018-10-18 ENCOUNTER — Telehealth: Payer: Self-pay

## 2018-10-18 ENCOUNTER — Telehealth: Payer: Self-pay | Admitting: Cardiovascular Disease

## 2018-10-18 DIAGNOSIS — I130 Hypertensive heart and chronic kidney disease with heart failure and stage 1 through stage 4 chronic kidney disease, or unspecified chronic kidney disease: Secondary | ICD-10-CM | POA: Diagnosis not present

## 2018-10-18 DIAGNOSIS — I2510 Atherosclerotic heart disease of native coronary artery without angina pectoris: Secondary | ICD-10-CM | POA: Diagnosis not present

## 2018-10-18 DIAGNOSIS — J4520 Mild intermittent asthma, uncomplicated: Secondary | ICD-10-CM | POA: Diagnosis not present

## 2018-10-18 DIAGNOSIS — G3184 Mild cognitive impairment, so stated: Secondary | ICD-10-CM | POA: Diagnosis not present

## 2018-10-18 DIAGNOSIS — I5033 Acute on chronic diastolic (congestive) heart failure: Secondary | ICD-10-CM | POA: Diagnosis not present

## 2018-10-18 DIAGNOSIS — N183 Chronic kidney disease, stage 3 (moderate): Secondary | ICD-10-CM | POA: Diagnosis not present

## 2018-10-18 NOTE — Telephone Encounter (Signed)
Pacemaker download looks fine, no recent atrial fibrillation, normal device function. Maybe she had lots of ectopic beats or the pulse ox was just not picking the pulse up well

## 2018-10-18 NOTE — Telephone Encounter (Signed)
[name] (orthopedic specialist) called to inform provider patient has is lethargic and has very low pulse running between 66 and 43. States patient currently has pacemaker and A-Fib.  Holly Bolton wanted to call and see if anything needed to be done. She states pacemaker may need to be adjusted. She said we may need to call patient's son [name].  Please advise.

## 2018-10-18 NOTE — Telephone Encounter (Signed)
Called and spoke with Delsa Sale at Dr. Sallyanne Kuster office, informed her of information given by Manuela Schwartz orthopedic specialist and her direct number. Also discussed Dr. Magdalene Molly response. Delsa Sale states she will reach out to Manuela Schwartz and get information to Dr. Sallyanne Kuster. She would also work on getting patient evaluated.

## 2018-10-18 NOTE — Telephone Encounter (Signed)
°  STAT if HR is under 50 or over 120 (normal HR is 60-100 beats per minute)  1) What is your heart rate? Fluctuates between 43-66  2) Do you have a log of your heart rate readings (document readings)?   3) Do you have any other symptoms? Low energy.   Holly Bolton is an orthopedic specialist that was evaluating the patient after a hospital d.c for CHF exacerbation. Holly Bolton tried to take the pt's pulse with a pulse ox monitor, and noticed that the pt was jumping back and forth between 43-66. The specialist waited about 20 minutes and tried to take the pulse radially, and the pulse was still jumping back and forth  Holly Bolton was concerned because the pt has a history of afib until the pacemaker was put in. Holly Bolton was concerned that the HR was so irregular even with the pacemaker. Holly Bolton first contacted Dr. Hollace Kinnier, the pt's PCP, and Dr. Mariea Clonts advised her to call Dr. Sallyanne Kuster and make him aware of the situation.   Ms Holly Bolton is no longer with the patient, but the pt lives with her son Holly Bolton, who is with her. His information is on her DPR.

## 2018-10-18 NOTE — Telephone Encounter (Signed)
It's possible that she is in AFib or another irregular rhythm, which makes the pulse oximeter pulse rate reading very inaccurate. Please ask her to do an ad hoc download on her pacemaker.

## 2018-10-18 NOTE — Telephone Encounter (Signed)
RETURNED CALL TO SON HE STATES THAT HE WILL DO THE DOWNLOAD ASAP AND HE STATED THAT HER HR "CALMED DOWN" AND IT IS 67 AND O2 IS 97%IT IS NO LONGER JUMPING AROUND BUT HE WILL STILL DO THE DOWNLOAD AS REQUESTED.

## 2018-10-18 NOTE — Telephone Encounter (Signed)
I would suggest we call her cardiologist's office and notify them that Holly Bolton's HR is running 43-66.  She may need her pacemaker interrogated.  We can also inform [name].

## 2018-10-18 NOTE — Telephone Encounter (Signed)
Cardiologist is Dr. Sallyanne Kuster.  Also, pt is already on the lowest dose of coreg and does have CHF for which she was just hospitalized so holding it may not be in her best interest.  We need his opinion on this.

## 2018-10-22 NOTE — Telephone Encounter (Signed)
Pt son, Dickie Labarre, notified. He states that everything is back to normal. He will CB if anything else is needed.

## 2018-10-23 ENCOUNTER — Telehealth: Payer: Self-pay | Admitting: Cardiovascular Disease

## 2018-10-23 DIAGNOSIS — N183 Chronic kidney disease, stage 3 (moderate): Secondary | ICD-10-CM | POA: Diagnosis not present

## 2018-10-23 DIAGNOSIS — I5033 Acute on chronic diastolic (congestive) heart failure: Secondary | ICD-10-CM | POA: Diagnosis not present

## 2018-10-23 DIAGNOSIS — J4520 Mild intermittent asthma, uncomplicated: Secondary | ICD-10-CM | POA: Diagnosis not present

## 2018-10-23 DIAGNOSIS — I130 Hypertensive heart and chronic kidney disease with heart failure and stage 1 through stage 4 chronic kidney disease, or unspecified chronic kidney disease: Secondary | ICD-10-CM | POA: Diagnosis not present

## 2018-10-23 DIAGNOSIS — I2510 Atherosclerotic heart disease of native coronary artery without angina pectoris: Secondary | ICD-10-CM | POA: Diagnosis not present

## 2018-10-23 DIAGNOSIS — G3184 Mild cognitive impairment, so stated: Secondary | ICD-10-CM | POA: Diagnosis not present

## 2018-10-23 NOTE — Telephone Encounter (Signed)
Spoke with the patient's son. He stated that the physical therapist had called in to give the patient's current weight. The patient is currently at 89 pounds and has mild lower extremity edema. She denies any shortness of breath.  The patient had been discharged from the hospital with bilateral pleural effusions on 9/1. She was down to 85 pounds after her hospitalization.   She is seeing her PCP in the morning and will call back if anything further is needed.

## 2018-10-23 NOTE — Telephone Encounter (Signed)
New Message   Pt c/o swelling: STAT is pt has developed SOB within 24 hours  1) How much weight have you gained and in what time span? 4lbs in a week 85 to 89. One day event of 3lbs gain on the 6th  2) If swelling, where is the swelling located? Yes, swelling in both ankles   3) Are you currently taking a fluid pill? yes  4) Are you currently SOB? no  5) Do you have a log of your daily weights (if so, list)? 85,86, 89  6) Have you gained 3 pounds in a day or 5 pounds in a week? 3lbs on the 6th and have been at 15 since the 6th  7) Have you traveled recently? No    This information is provided from the Holly Bolton.

## 2018-10-24 ENCOUNTER — Ambulatory Visit (INDEPENDENT_AMBULATORY_CARE_PROVIDER_SITE_OTHER): Payer: Medicare Other | Admitting: Internal Medicine

## 2018-10-24 ENCOUNTER — Other Ambulatory Visit: Payer: Self-pay

## 2018-10-24 ENCOUNTER — Encounter: Payer: Self-pay | Admitting: Internal Medicine

## 2018-10-24 VITALS — BP 122/60 | HR 88 | Temp 97.9°F | Ht 59.0 in | Wt 92.0 lb

## 2018-10-24 DIAGNOSIS — R54 Age-related physical debility: Secondary | ICD-10-CM | POA: Diagnosis not present

## 2018-10-24 DIAGNOSIS — Z23 Encounter for immunization: Secondary | ICD-10-CM | POA: Diagnosis not present

## 2018-10-24 DIAGNOSIS — N184 Chronic kidney disease, stage 4 (severe): Secondary | ICD-10-CM

## 2018-10-24 DIAGNOSIS — R413 Other amnesia: Secondary | ICD-10-CM

## 2018-10-24 DIAGNOSIS — Z7189 Other specified counseling: Secondary | ICD-10-CM

## 2018-10-24 DIAGNOSIS — E0590 Thyrotoxicosis, unspecified without thyrotoxic crisis or storm: Secondary | ICD-10-CM | POA: Diagnosis not present

## 2018-10-24 DIAGNOSIS — I5032 Chronic diastolic (congestive) heart failure: Secondary | ICD-10-CM | POA: Diagnosis not present

## 2018-10-24 LAB — CBC WITH DIFFERENTIAL/PLATELET
Absolute Monocytes: 977 cells/uL — ABNORMAL HIGH (ref 200–950)
Basophils Absolute: 31 cells/uL (ref 0–200)
Basophils Relative: 0.7 %
Eosinophils Absolute: 70 cells/uL (ref 15–500)
Eosinophils Relative: 1.6 %
HCT: 29.5 % — ABNORMAL LOW (ref 35.0–45.0)
Hemoglobin: 9.6 g/dL — ABNORMAL LOW (ref 11.7–15.5)
Lymphs Abs: 994 cells/uL (ref 850–3900)
MCH: 32.9 pg (ref 27.0–33.0)
MCHC: 32.5 g/dL (ref 32.0–36.0)
MCV: 101 fL — ABNORMAL HIGH (ref 80.0–100.0)
MPV: 11.1 fL (ref 7.5–12.5)
Monocytes Relative: 22.2 %
Neutro Abs: 2328 cells/uL (ref 1500–7800)
Neutrophils Relative %: 52.9 %
Platelets: 220 10*3/uL (ref 140–400)
RBC: 2.92 10*6/uL — ABNORMAL LOW (ref 3.80–5.10)
RDW: 15.2 % — ABNORMAL HIGH (ref 11.0–15.0)
Total Lymphocyte: 22.6 %
WBC: 4.4 10*3/uL (ref 3.8–10.8)

## 2018-10-24 LAB — BASIC METABOLIC PANEL
BUN/Creatinine Ratio: 20 (calc) (ref 6–22)
BUN: 34 mg/dL — ABNORMAL HIGH (ref 7–25)
CO2: 31 mmol/L (ref 20–32)
Calcium: 8.9 mg/dL (ref 8.6–10.4)
Chloride: 106 mmol/L (ref 98–110)
Creat: 1.7 mg/dL — ABNORMAL HIGH (ref 0.60–0.88)
Glucose, Bld: 106 mg/dL (ref 65–139)
Potassium: 4.6 mmol/L (ref 3.5–5.3)
Sodium: 141 mmol/L (ref 135–146)

## 2018-10-24 MED ORDER — FUROSEMIDE 20 MG PO TABS: ORAL_TABLET | ORAL | 3 refills | Status: DC

## 2018-10-24 MED ORDER — FUROSEMIDE 20 MG PO TABS: 20.0000 mg | ORAL_TABLET | ORAL | 3 refills | Status: DC

## 2018-10-24 NOTE — Progress Notes (Signed)
Location:  [facility] clinic Provider: Chezney Huether L. Mariea Bolton, D.O., C.M.D.  Code Status: Full code, but pt thinking about this Goals of Care:  Advanced Directives 02/11/2018  Does Patient Have a Medical Advance Directive? No  Type of Advance Directive -  Does patient want to make changes to medical advance directive? -  Would patient like information on creating a medical advance directive? No - Patient declined     Chief Complaint  Patient presents with  . Transitions Of Care     discharged 10/15/2018    HPI: Patient is a 85 y.o. female seen today for hospital follow-up s/p admission from 8/28-10/15/18 for acute on chronic diastolic heart failure.  She has a h/o second degree AV block, sick sinus syndrome s/p pacemaker, aortic aneurysm s/p ascending aorta repair with bioprosthetic valve/medtronic freestyle, CAD, essential htn, PAD, hyperthyroidism.  She'd been c/o increased pain behind her knees when I'd seen her and did have more swelling, but no other localizing symptoms a week before her hospitalization (just malaise and decreased mobility). She'd been seen in our office by our NP and she was noted to be dyspneic, tachypneic, and had increased edema of her legs.  Her son says historically, the albuterol would help her wheezing.  It sounded more like she was struggling to breathe.  NP sent her to [facility] where her xray revealed b/l pleural effusions and elevated BNP.  She received IV lasix.   She was 85 lbs at hospital d/c and yesterday 89 lbs.  She does not remember her hospitalization.  She has not had increased sob since leaving.  She's got some increased edema of her ankles.  Medications were reconciled today.  PT came to the house and did give Holly Bolton the parameters for calling for increased lasix:  5 lbs up in a week or 3 lbs in a day.  Holly Bolton fixes toast, piece of bacon and fruit.  Fixes her own lunch like 1/2 sandwich.  Dinner is lean cuisines and additional side like green beans or carrots.  She  only eats 1/2 of the frozen dinner.  Holly Bolton reports it has 500mg  sodium.  We discussed that the bacon, lunchmeat and the frozen meal are all high in sodium.  1800 cc fluid restriction--Holly Bolton is lucky if she drinks 3-4 cups of water.    She keeps repeating she does not remember even being in the hospital.  She has had some difficulty with short-term memory.    Olmesartan is on hold from the hospital.  BP 122/60 today.  Has not been dizzy.  Not having difficulty when stands.  BP has been consistently this low.    Past Medical History:  Diagnosis Date  . Aneurysm (arteriovenous) of coronary vessels    ascending aorta requiring bypass of the LAD  . Carotid stenosis 04/28/08   Doppler: <40% stenosis bilateral  . Chronic kidney disease, stage 3 ([place])   . Hyperlipemia   . Hypertension   . Hypertensive chronic kidney disease   . Hypothyroidism, unspecified 02/13/2018   Symptoms well controlled with current therapy  . Mild cognitive impairment, so stated 03/28/2018   Symptoms poorly controlled, needs frequent adjustments in treatment and dose monitoring  . Mild intermittent asthma    Symptoms controlled with difficulty, affecting daily functioning, needs ongoing monitoring  . Occlusion and stenosis of bilateral carotid arteries   . Pacemaker generator end of life 11/18/08   Intermittent high-grade atrioventricular block  . Peripheral arterial disease (HCC)    left ABI  of 0.78  . Presence of permanent cardiac pacemaker 08/26/01   Sinus node dysfunction-[place]  . Presence of xenogenic heart valve   . PVD (peripheral vascular disease) ([place])   . SSS (sick sinus syndrome) ([place])   . Status post ascending aortic aneurysm repair/AVR -  Medtronic Freestyle root 09/28/2014   ascending aorta requiring bypass of the LAD     Past Surgical History:  Procedure Laterality Date  . ABDOMINAL HYSTERECTOMY  1966  . APPENDECTOMY  1943  . ASCENDING AORTIC ANEURYSM REPAIR  09/22/97   porcine aortic root  . BREAST  FIBROADENOMA SURGERY  11/89  . CARDIAC SURGERY  09/22/1997   aortic valve root repair with porcine at time of CABG  . CORONARY ARTERY BYPASS GRAFT  09/22/97   LIMA to the LAD  . I&D EXTREMITY Left 08/15/2017   Procedure: IRRIGATION AND DEBRIDEMENT EXTREMITY;  Surgeon: Nicholes Stairs, MD;  Location: [facility];  Service: Orthopedics;  Laterality: Left;  . [place]   left  . NEPHRECTOMY  1958   right  . ORIF HUMERUS FRACTURE Left 08/15/2017   Procedure: OPEN REDUCTION INTERNAL FIXATION (ORIF) PROXIMAL HUMERUS FRACTURE;  Surgeon: Nicholes Stairs, MD;  Location: [facility];  Service: Orthopedics;  Laterality: Left;  . OVARIAN CYST SURGERY  1948  . PACEMAKER GENERATOR CHANGE  11/18/08   [place]  . PACEMAKER INSERTION  08/26/01   [place]  . PPM GENERATOR CHANGEOUT N/A 05/16/2017   Procedure: PPM GENERATOR CHANGEOUT;  Surgeon: Sanda Klein, MD;  Location: [facility];  Service: Cardiovascular;  Laterality: N/A;    Allergies  Allergen Reactions  . Accupril [Quinapril Hcl] Other (See Comments)    Unknown reaction   . Amlodipine Swelling  . Benadryl [Diphenhydramine Hcl] Other (See Comments)    Unknown reaction   . Biaxin [Clarithromycin] Other (See Comments)    Unknown reaction   . Ciprofloxacin Other (See Comments)    Unknown reaction   . Codeine Other (See Comments)    Unknown reaction   . Diovan [Valsartan] Other (See Comments)    Unknown reaction   . Medrol [Methylprednisolone] Other (See Comments)    Unknown  . Morphine And Related Other (See Comments)    Unknown reaction   . Neurontin [Gabapentin] Other (See Comments)    Unknown reaction   . Penicillins     Unknown reaction  Has patient had a PCN reaction causing immediate rash, facial/tongue/throat swelling, SOB or lightheadedness with hypotension: Unknown Has patient had a PCN reaction causing severe rash involving mucus membranes or skin necrosis: Unknown Has patient had a PCN reaction that  required hospitalization: Unknown Has patient had a PCN reaction occurring within the last 10 years: No If all of the above answers are "NO", then may proceed with Cephalosporin use.     Outpatient Encounter Medications as of 10/24/2018  Medication Sig  . acetaminophen (TYLENOL) 500 MG tablet Take 500 mg by mouth every 6 (six) hours as needed for mild pain or headache.  . albuterol (ACCUNEB) 0.63 MG/3ML nebulizer solution Take 1 ampule by nebulization every 6 (six) hours as needed for wheezing.  Marland Kitchen aspirin 325 MG tablet Take 81 mg by mouth daily.   . brimonidine (ALPHAGAN) 0.2 % ophthalmic solution Place 1 drop into both eyes 2 (two) times daily.   . carvedilol (COREG) 3.125 MG tablet Take 1 tablet (3.125 mg total) by mouth 2 (two) times daily with a meal.  . Cholecalciferol (VITAMIN D) 50 MCG (  2000 UT) CAPS Take 1 capsule by mouth daily.  . Coenzyme Q10 (COQ10) 200 MG CAPS Take 200 mg by mouth daily.  . furosemide (LASIX) 20 MG tablet TAKE ONE TABLET (20 MG) BY MOUTH TWICE A WEEK. [place]  . methimazole (TAPAZOLE) 5 MG tablet Take 5 mg by mouth daily.  Marland Kitchen olmesartan (BENICAR) 40 MG tablet Take 1 tablet (40 mg total) by mouth daily.  . vitamin B-12 (CYANOCOBALAMIN) 500 MCG tablet Take 500 mcg by mouth daily.   No facility-administered encounter medications on file as of 10/24/2018.     Review of Systems:  Review of Systems  Constitutional: Positive for malaise/fatigue and weight loss. Negative for chills and fever.  HENT: Negative for congestion.   Eyes: Negative for blurred vision.       Glasses  Respiratory: Negative for cough and shortness of breath.   Cardiovascular: Positive for leg swelling. Negative for chest pain, palpitations, orthopnea and PND.  Gastrointestinal: Negative for abdominal pain, blood in stool, constipation, diarrhea and melena.  Genitourinary: Negative for dysuria.  Musculoskeletal: Positive for joint pain. Negative for falls.  Skin: Negative for  itching and rash.  Neurological: Negative for dizziness, loss of consciousness and headaches.  Endo/Heme/Allergies: Bruises/bleeds easily.  Psychiatric/Behavioral: Positive for memory loss. Negative for depression. The patient is not nervous/anxious and does not have insomnia.     Health Maintenance  Topic Date Due  . DEXA SCAN  10/17/1989  . INFLUENZA VACCINE  09/14/2018  . TETANUS/TDAP  08/15/2027  . PNA vac Low Risk Adult  Completed    Physical Exam: Vitals:   10/24/18 0812  BP: 122/60  Pulse: 88  Temp: 97.9 F (36.6 C)  TempSrc: Oral  SpO2: 96%  Weight: 92 lb (41.7 kg)  Height: 4\' 11"  (1.499 m)   Body mass index is 18.58 kg/m. Physical Exam Vitals signs reviewed.  Constitutional:      General: She is not in acute distress.    Appearance: Normal appearance. She is not ill-appearing or toxic-appearing.  HENT:     Head: Normocephalic and atraumatic.  Cardiovascular:     Rate and Rhythm: Normal rate and regular rhythm.     Heart sounds: Murmur present.  Pulmonary:     Effort: Pulmonary effort is normal.     Breath sounds: No rales.     Comments: Diminished breath sounds at bases Abdominal:     General: Bowel sounds are normal.     Palpations: Abdomen is soft.  Musculoskeletal: Normal range of motion.     Right lower leg: Edema present.     Left lower leg: Edema present.     Comments: Walking with cane  Skin:    General: Skin is warm and dry.     Capillary Refill: Capillary refill takes less than 2 seconds.     Coloration: Skin is pale.  Neurological:     General: No focal deficit present.     Mental Status: She is alert and oriented to person, place, and time. Mental status is at baseline.     Motor: Weakness present.     Gait: Gait abnormal.     Comments: Repeating herself some today as she did last time, pleasant and appropriate  Psychiatric:        Mood and Affect: Mood normal.     Labs reviewed: Basic Metabolic Panel: Recent Labs     02/11/18 1049 09/26/18 1225  10/11/18 2301  10/13/18 0416 10/14/18 0543 10/15/18 0504  NA 141 142   < >  --    < >  142 139 138  K 4.5 4.7   < >  --    < > 3.5 3.5 3.8  CL 108 107   < >  --    < > 100 100 98  CO2 27 27   < >  --    < > 30 29 30   GLUCOSE 102 107   < >  --    < > 94 93 105*  BUN 25 31*   < >  --    < > 33* 36* 36*  CREATININE 1.36* 1.28*   < > 1.66*   < > 1.72* 1.68* 1.71*  CALCIUM 9.2 9.3   < >  --    < > 8.9 8.5* 9.0  MG  --   --   --  2.3  --   --   --  2.0  PHOS  --   --   --  4.2  --   --   --   --   TSH 0.08* 18.30*  --  8.881*  --   --   --   --    < > = values in this interval not displayed.   Liver Function Tests: Recent Labs    02/11/18 1049 09/26/18 1225 10/11/18 1715  AST 14 15 22   ALT 8 11 19   ALKPHOS  --   --  80  BILITOT 0.7 0.9 1.3*  PROT 6.4 6.6 6.9  ALBUMIN  --   --  3.8   Recent Labs    10/11/18 1715  LIPASE 51   No results for input(s): AMMONIA in the last 8760 hours. CBC: Recent Labs    02/11/18 1049 09/26/18 1225  10/12/18 0426 10/13/18 0416 10/14/18 0543  WBC 5.4 6.6   < > 7.2 5.1 5.2  NEUTROABS 3,175 4,653  --   --   --   --   HGB 10.1* 10.4*   < > 11.6* 10.4* 9.7*  HCT 29.5* 31.9*   < > 35.6* 30.9* 29.1*  MCV 96.4 100.9*   < > 101.4* 99.0 99.7  PLT 225 210   < > 201 181 177   < > = values in this interval not displayed.   Procedures since last visit: Dg Chest 2 View  Result Date: 10/11/2018 CLINICAL DATA:  Shortness of breath EXAM: CHEST - 2 VIEW COMPARISON:  02/11/2018 FINDINGS: Cardiomegaly. Prior median sternotomy. Moderate left pleural effusion and small right pleural effusion. Bibasilar atelectasis. Right pacer is unchanged. IMPRESSION: Bilateral effusions and bibasilar atelectasis, left greater than right. Mild cardiomegaly. Electronically Signed   By: Rolm Baptise M.D.   On: 10/11/2018 15:12    Assessment/Plan 1. Chronic diastolic heart failure (HCC) - keeps small pleural effusions - getting lasix 2 times  per week, but increase to three times a week due to her weight gain already -counseled on diet--taking in high sodium foods, but amounts are limited so may not be exceeding 2g -no issues with fluid restriction -stay off olmesartan due to low bp and risk of falls from orthostatic hypotension and current need for more diuresis - CBC with Differential/Platelet - Basic metabolic panel - furosemide (LASIX) 20 MG tablet; Take 1 tablet (20 mg total) by mouth 3 (three) times a week. Mon/Wed/Fri  Dispense: 12 tablet; Refill: 3 - AMB referral to CHF clinic  2. Need for influenza vaccination - Flu Vaccine QUAD High Dose(Fluad) given  3. Frailty syndrome in geriatric patient - cont home PT   4.  Chronic kidney disease, stage 4 (severe) (HCC) - Avoid nephrotoxic agents like nsaids, dose adjust renally excreted meds -has just one kidney - AMB referral to CHF clinic  5. Hyperthyroidism - was on methimazole 5mg  po tid and was overcorrected so I reduced to once a day after last labs -may have contributed to her chf exacerbation -has repeat labs 10/6 for this and then I see her again afterward - AMB referral to CHF clinic  6. Memory loss, short term -does not remember hospitalization and is repeating herself gradually more -difficult to tell if MCI vs early dementia b/c her son assists her with many things simply due to her physical frailty  9.  ACP -she says she is not sure if she'd want CPR -we discussed underlying conditions that make her chances of survival significantly less--CHF, PAD, single kidney, frailty -we also reviewed the actual CPR process -we discussed risks of poor quality of life after surviving the CPR process -Holly Bolton did not want to pressure her into any decisions and says it's her choice which is accurate and she does appear to have capacity to make this decision -I suggested they discuss more at home b/c Louisa did not want to decide today -of note, she did say, "sometimes you reach  a point in life where you're not doing anything for anyone else and you figure, what's the point?" -25 mins spent on ACP  Labs/tests ordered:   Lab Orders     CBC with Differential/Platelet     Basic metabolic panel  Next appt:  11/19/2018    Holly Bolton L. Ellinor Test, D.O. [facility] [address], [place] [address] Cell Phone (Mon-Fri 8am-5pm):  [phone] On Call:  [phone] & follow prompts after 5pm & weekends Office Phone:  [phone] Office Fax:  [phone]

## 2018-10-24 NOTE — Patient Instructions (Addendum)
Please think more about whether you would want [place] to do CPR on you if your heart stops.  I'm concerned you will not do well if you survive this process at all at your age and with your medical problems including the heart failure, one kidney and weakness.  We have forms that are physicians' orders we can complete for you.  Take an additional dose of lasix today due to your weight gain.  Beginning next week, take lasix on mon, wed, fri.  [name], let me know if her weight is up 3 lbs in one day or 5 lbs in a week so we can do extra lasix doses to keep her fluid under control.  Continue off olmesartan due to low blood pressures at this time.  We don't need you to fall.  I will refer you to the heart failure clinic.  It looks like that was the plan from the hospital.

## 2018-10-30 DIAGNOSIS — G3184 Mild cognitive impairment, so stated: Secondary | ICD-10-CM | POA: Diagnosis not present

## 2018-10-30 DIAGNOSIS — I130 Hypertensive heart and chronic kidney disease with heart failure and stage 1 through stage 4 chronic kidney disease, or unspecified chronic kidney disease: Secondary | ICD-10-CM | POA: Diagnosis not present

## 2018-10-30 DIAGNOSIS — I5033 Acute on chronic diastolic (congestive) heart failure: Secondary | ICD-10-CM | POA: Diagnosis not present

## 2018-10-30 DIAGNOSIS — J4520 Mild intermittent asthma, uncomplicated: Secondary | ICD-10-CM | POA: Diagnosis not present

## 2018-10-30 DIAGNOSIS — N183 Chronic kidney disease, stage 3 (moderate): Secondary | ICD-10-CM | POA: Diagnosis not present

## 2018-10-30 DIAGNOSIS — I2510 Atherosclerotic heart disease of native coronary artery without angina pectoris: Secondary | ICD-10-CM | POA: Diagnosis not present

## 2018-10-31 DIAGNOSIS — I130 Hypertensive heart and chronic kidney disease with heart failure and stage 1 through stage 4 chronic kidney disease, or unspecified chronic kidney disease: Secondary | ICD-10-CM | POA: Diagnosis not present

## 2018-10-31 DIAGNOSIS — G3184 Mild cognitive impairment, so stated: Secondary | ICD-10-CM | POA: Diagnosis not present

## 2018-10-31 DIAGNOSIS — J4520 Mild intermittent asthma, uncomplicated: Secondary | ICD-10-CM | POA: Diagnosis not present

## 2018-10-31 DIAGNOSIS — I2510 Atherosclerotic heart disease of native coronary artery without angina pectoris: Secondary | ICD-10-CM | POA: Diagnosis not present

## 2018-10-31 DIAGNOSIS — N183 Chronic kidney disease, stage 3 (moderate): Secondary | ICD-10-CM | POA: Diagnosis not present

## 2018-10-31 DIAGNOSIS — I5033 Acute on chronic diastolic (congestive) heart failure: Secondary | ICD-10-CM | POA: Diagnosis not present

## 2018-11-01 DIAGNOSIS — I5033 Acute on chronic diastolic (congestive) heart failure: Secondary | ICD-10-CM | POA: Diagnosis not present

## 2018-11-01 DIAGNOSIS — I130 Hypertensive heart and chronic kidney disease with heart failure and stage 1 through stage 4 chronic kidney disease, or unspecified chronic kidney disease: Secondary | ICD-10-CM | POA: Diagnosis not present

## 2018-11-01 DIAGNOSIS — J4520 Mild intermittent asthma, uncomplicated: Secondary | ICD-10-CM | POA: Diagnosis not present

## 2018-11-01 DIAGNOSIS — I2510 Atherosclerotic heart disease of native coronary artery without angina pectoris: Secondary | ICD-10-CM | POA: Diagnosis not present

## 2018-11-01 DIAGNOSIS — G3184 Mild cognitive impairment, so stated: Secondary | ICD-10-CM | POA: Diagnosis not present

## 2018-11-01 DIAGNOSIS — N183 Chronic kidney disease, stage 3 (moderate): Secondary | ICD-10-CM | POA: Diagnosis not present

## 2018-11-04 ENCOUNTER — Telehealth: Payer: Self-pay | Admitting: *Deleted

## 2018-11-04 DIAGNOSIS — I5033 Acute on chronic diastolic (congestive) heart failure: Secondary | ICD-10-CM | POA: Diagnosis not present

## 2018-11-04 DIAGNOSIS — I2510 Atherosclerotic heart disease of native coronary artery without angina pectoris: Secondary | ICD-10-CM | POA: Diagnosis not present

## 2018-11-04 DIAGNOSIS — G3184 Mild cognitive impairment, so stated: Secondary | ICD-10-CM | POA: Diagnosis not present

## 2018-11-04 DIAGNOSIS — I130 Hypertensive heart and chronic kidney disease with heart failure and stage 1 through stage 4 chronic kidney disease, or unspecified chronic kidney disease: Secondary | ICD-10-CM | POA: Diagnosis not present

## 2018-11-04 DIAGNOSIS — N183 Chronic kidney disease, stage 3 (moderate): Secondary | ICD-10-CM | POA: Diagnosis not present

## 2018-11-04 DIAGNOSIS — J4520 Mild intermittent asthma, uncomplicated: Secondary | ICD-10-CM | POA: Diagnosis not present

## 2018-11-04 NOTE — Telephone Encounter (Signed)
Holly Bolton with [facility] called and stated that she just needed to report that patient's T: 94.5 orally and 94.4 axillary  Stated she just had to report because it was out of perimeter.

## 2018-11-06 ENCOUNTER — Telehealth: Payer: Self-pay

## 2018-11-06 DIAGNOSIS — I2510 Atherosclerotic heart disease of native coronary artery without angina pectoris: Secondary | ICD-10-CM | POA: Diagnosis not present

## 2018-11-06 DIAGNOSIS — I5033 Acute on chronic diastolic (congestive) heart failure: Secondary | ICD-10-CM | POA: Diagnosis not present

## 2018-11-06 DIAGNOSIS — I130 Hypertensive heart and chronic kidney disease with heart failure and stage 1 through stage 4 chronic kidney disease, or unspecified chronic kidney disease: Secondary | ICD-10-CM | POA: Diagnosis not present

## 2018-11-06 DIAGNOSIS — J4520 Mild intermittent asthma, uncomplicated: Secondary | ICD-10-CM | POA: Diagnosis not present

## 2018-11-06 DIAGNOSIS — N183 Chronic kidney disease, stage 3 (moderate): Secondary | ICD-10-CM | POA: Diagnosis not present

## 2018-11-06 DIAGNOSIS — G3184 Mild cognitive impairment, so stated: Secondary | ICD-10-CM | POA: Diagnosis not present

## 2018-11-06 NOTE — Telephone Encounter (Signed)
Patient has a lab appointment scheduled for 11/19/18, but there are no lab orders in Epic.  She had CBC and BMP drawn on 10/24/18, which were abnormal.  The below was taken from her last visit on 10/24/18.   Labs/tests ordered:   Lab Orders     CBC with Differential/Platelet     Basic metabolic panel

## 2018-11-06 NOTE — Progress Notes (Signed)
This encounter was created in error - please disregard.

## 2018-11-06 NOTE — Telephone Encounter (Signed)
There are labs ordered 8/13 which she is meant to get when she comes 10/6.  They will need to be released.  Thanks.

## 2018-11-07 DIAGNOSIS — N183 Chronic kidney disease, stage 3 (moderate): Secondary | ICD-10-CM | POA: Diagnosis not present

## 2018-11-07 DIAGNOSIS — I5033 Acute on chronic diastolic (congestive) heart failure: Secondary | ICD-10-CM | POA: Diagnosis not present

## 2018-11-07 DIAGNOSIS — J4520 Mild intermittent asthma, uncomplicated: Secondary | ICD-10-CM | POA: Diagnosis not present

## 2018-11-07 DIAGNOSIS — I2510 Atherosclerotic heart disease of native coronary artery without angina pectoris: Secondary | ICD-10-CM | POA: Diagnosis not present

## 2018-11-07 DIAGNOSIS — I130 Hypertensive heart and chronic kidney disease with heart failure and stage 1 through stage 4 chronic kidney disease, or unspecified chronic kidney disease: Secondary | ICD-10-CM | POA: Diagnosis not present

## 2018-11-07 DIAGNOSIS — G3184 Mild cognitive impairment, so stated: Secondary | ICD-10-CM | POA: Diagnosis not present

## 2018-11-08 DIAGNOSIS — I5033 Acute on chronic diastolic (congestive) heart failure: Secondary | ICD-10-CM | POA: Diagnosis not present

## 2018-11-08 DIAGNOSIS — N183 Chronic kidney disease, stage 3 (moderate): Secondary | ICD-10-CM | POA: Diagnosis not present

## 2018-11-08 DIAGNOSIS — I130 Hypertensive heart and chronic kidney disease with heart failure and stage 1 through stage 4 chronic kidney disease, or unspecified chronic kidney disease: Secondary | ICD-10-CM | POA: Diagnosis not present

## 2018-11-08 DIAGNOSIS — G3184 Mild cognitive impairment, so stated: Secondary | ICD-10-CM | POA: Diagnosis not present

## 2018-11-08 DIAGNOSIS — I2510 Atherosclerotic heart disease of native coronary artery without angina pectoris: Secondary | ICD-10-CM | POA: Diagnosis not present

## 2018-11-08 DIAGNOSIS — J4520 Mild intermittent asthma, uncomplicated: Secondary | ICD-10-CM | POA: Diagnosis not present

## 2018-11-11 DIAGNOSIS — J4520 Mild intermittent asthma, uncomplicated: Secondary | ICD-10-CM | POA: Diagnosis not present

## 2018-11-11 DIAGNOSIS — G3184 Mild cognitive impairment, so stated: Secondary | ICD-10-CM | POA: Diagnosis not present

## 2018-11-11 DIAGNOSIS — I2510 Atherosclerotic heart disease of native coronary artery without angina pectoris: Secondary | ICD-10-CM | POA: Diagnosis not present

## 2018-11-11 DIAGNOSIS — I130 Hypertensive heart and chronic kidney disease with heart failure and stage 1 through stage 4 chronic kidney disease, or unspecified chronic kidney disease: Secondary | ICD-10-CM | POA: Diagnosis not present

## 2018-11-11 DIAGNOSIS — I5033 Acute on chronic diastolic (congestive) heart failure: Secondary | ICD-10-CM | POA: Diagnosis not present

## 2018-11-11 DIAGNOSIS — N183 Chronic kidney disease, stage 3 (moderate): Secondary | ICD-10-CM | POA: Diagnosis not present

## 2018-11-12 ENCOUNTER — Other Ambulatory Visit: Payer: Self-pay

## 2018-11-12 DIAGNOSIS — I5033 Acute on chronic diastolic (congestive) heart failure: Secondary | ICD-10-CM | POA: Diagnosis not present

## 2018-11-12 DIAGNOSIS — G3184 Mild cognitive impairment, so stated: Secondary | ICD-10-CM | POA: Diagnosis not present

## 2018-11-12 DIAGNOSIS — N183 Chronic kidney disease, stage 3 (moderate): Secondary | ICD-10-CM | POA: Diagnosis not present

## 2018-11-12 DIAGNOSIS — I2510 Atherosclerotic heart disease of native coronary artery without angina pectoris: Secondary | ICD-10-CM | POA: Diagnosis not present

## 2018-11-12 DIAGNOSIS — I130 Hypertensive heart and chronic kidney disease with heart failure and stage 1 through stage 4 chronic kidney disease, or unspecified chronic kidney disease: Secondary | ICD-10-CM | POA: Diagnosis not present

## 2018-11-12 DIAGNOSIS — J4520 Mild intermittent asthma, uncomplicated: Secondary | ICD-10-CM | POA: Diagnosis not present

## 2018-11-12 DIAGNOSIS — E0590 Thyrotoxicosis, unspecified without thyrotoxic crisis or storm: Secondary | ICD-10-CM

## 2018-11-12 DIAGNOSIS — N184 Chronic kidney disease, stage 4 (severe): Secondary | ICD-10-CM

## 2018-11-12 NOTE — Telephone Encounter (Signed)
New orders entered for T4, free; T3, free; TSH, CMP with GFR; CBC with differential and platelets.

## 2018-11-13 DIAGNOSIS — I130 Hypertensive heart and chronic kidney disease with heart failure and stage 1 through stage 4 chronic kidney disease, or unspecified chronic kidney disease: Secondary | ICD-10-CM | POA: Diagnosis not present

## 2018-11-13 DIAGNOSIS — J4520 Mild intermittent asthma, uncomplicated: Secondary | ICD-10-CM | POA: Diagnosis not present

## 2018-11-13 DIAGNOSIS — G3184 Mild cognitive impairment, so stated: Secondary | ICD-10-CM | POA: Diagnosis not present

## 2018-11-13 DIAGNOSIS — I5033 Acute on chronic diastolic (congestive) heart failure: Secondary | ICD-10-CM | POA: Diagnosis not present

## 2018-11-13 DIAGNOSIS — I2510 Atherosclerotic heart disease of native coronary artery without angina pectoris: Secondary | ICD-10-CM | POA: Diagnosis not present

## 2018-11-13 DIAGNOSIS — N183 Chronic kidney disease, stage 3 (moderate): Secondary | ICD-10-CM | POA: Diagnosis not present

## 2018-11-14 DIAGNOSIS — I2510 Atherosclerotic heart disease of native coronary artery without angina pectoris: Secondary | ICD-10-CM | POA: Diagnosis not present

## 2018-11-14 DIAGNOSIS — G3184 Mild cognitive impairment, so stated: Secondary | ICD-10-CM | POA: Diagnosis not present

## 2018-11-14 DIAGNOSIS — I130 Hypertensive heart and chronic kidney disease with heart failure and stage 1 through stage 4 chronic kidney disease, or unspecified chronic kidney disease: Secondary | ICD-10-CM | POA: Diagnosis not present

## 2018-11-14 DIAGNOSIS — J4520 Mild intermittent asthma, uncomplicated: Secondary | ICD-10-CM | POA: Diagnosis not present

## 2018-11-14 DIAGNOSIS — I5033 Acute on chronic diastolic (congestive) heart failure: Secondary | ICD-10-CM | POA: Diagnosis not present

## 2018-11-14 DIAGNOSIS — N183 Chronic kidney disease, stage 3 (moderate): Secondary | ICD-10-CM | POA: Diagnosis not present

## 2018-11-15 DIAGNOSIS — G3184 Mild cognitive impairment, so stated: Secondary | ICD-10-CM | POA: Diagnosis not present

## 2018-11-15 DIAGNOSIS — I2510 Atherosclerotic heart disease of native coronary artery without angina pectoris: Secondary | ICD-10-CM | POA: Diagnosis not present

## 2018-11-15 DIAGNOSIS — I5033 Acute on chronic diastolic (congestive) heart failure: Secondary | ICD-10-CM | POA: Diagnosis not present

## 2018-11-15 DIAGNOSIS — Z953 Presence of xenogenic heart valve: Secondary | ICD-10-CM | POA: Diagnosis not present

## 2018-11-15 DIAGNOSIS — E0590 Thyrotoxicosis, unspecified without thyrotoxic crisis or storm: Secondary | ICD-10-CM | POA: Diagnosis not present

## 2018-11-15 DIAGNOSIS — Z950 Presence of cardiac pacemaker: Secondary | ICD-10-CM | POA: Diagnosis not present

## 2018-11-15 DIAGNOSIS — I6523 Occlusion and stenosis of bilateral carotid arteries: Secondary | ICD-10-CM | POA: Diagnosis not present

## 2018-11-15 DIAGNOSIS — I495 Sick sinus syndrome: Secondary | ICD-10-CM | POA: Diagnosis not present

## 2018-11-15 DIAGNOSIS — Z951 Presence of aortocoronary bypass graft: Secondary | ICD-10-CM | POA: Diagnosis not present

## 2018-11-15 DIAGNOSIS — Z7982 Long term (current) use of aspirin: Secondary | ICD-10-CM | POA: Diagnosis not present

## 2018-11-15 DIAGNOSIS — E785 Hyperlipidemia, unspecified: Secondary | ICD-10-CM | POA: Diagnosis not present

## 2018-11-15 DIAGNOSIS — I130 Hypertensive heart and chronic kidney disease with heart failure and stage 1 through stage 4 chronic kidney disease, or unspecified chronic kidney disease: Secondary | ICD-10-CM | POA: Diagnosis not present

## 2018-11-15 DIAGNOSIS — N183 Chronic kidney disease, stage 3 unspecified: Secondary | ICD-10-CM | POA: Diagnosis not present

## 2018-11-15 DIAGNOSIS — Z905 Acquired absence of kidney: Secondary | ICD-10-CM | POA: Diagnosis not present

## 2018-11-15 DIAGNOSIS — I739 Peripheral vascular disease, unspecified: Secondary | ICD-10-CM | POA: Diagnosis not present

## 2018-11-15 DIAGNOSIS — J4520 Mild intermittent asthma, uncomplicated: Secondary | ICD-10-CM | POA: Diagnosis not present

## 2018-11-15 DIAGNOSIS — Z8781 Personal history of (healed) traumatic fracture: Secondary | ICD-10-CM | POA: Diagnosis not present

## 2018-11-15 DIAGNOSIS — I441 Atrioventricular block, second degree: Secondary | ICD-10-CM | POA: Diagnosis not present

## 2018-11-15 DIAGNOSIS — E440 Moderate protein-calorie malnutrition: Secondary | ICD-10-CM | POA: Diagnosis not present

## 2018-11-19 ENCOUNTER — Ambulatory Visit (INDEPENDENT_AMBULATORY_CARE_PROVIDER_SITE_OTHER): Payer: Medicare Other | Admitting: Family

## 2018-11-19 ENCOUNTER — Encounter: Payer: Self-pay | Admitting: Family

## 2018-11-19 ENCOUNTER — Ambulatory Visit
Admission: RE | Admit: 2018-11-19 | Discharge: 2018-11-19 | Disposition: A | Discharge status: Home / Self Care | Payer: Medicare Other | Source: Ambulatory Visit | Attending: Family | Admitting: Family

## 2018-11-19 ENCOUNTER — Other Ambulatory Visit: Payer: Self-pay

## 2018-11-19 ENCOUNTER — Other Ambulatory Visit: Payer: Medicare Other

## 2018-11-19 VITALS — BP 140/80 | HR 71 | Temp 96.9°F | Resp 20 | Ht 59.0 in | Wt 96.0 lb

## 2018-11-19 DIAGNOSIS — I745 Embolism and thrombosis of iliac artery: Secondary | ICD-10-CM

## 2018-11-19 DIAGNOSIS — I5032 Chronic diastolic (congestive) heart failure: Secondary | ICD-10-CM

## 2018-11-19 DIAGNOSIS — I5033 Acute on chronic diastolic (congestive) heart failure: Secondary | ICD-10-CM | POA: Diagnosis not present

## 2018-11-19 DIAGNOSIS — I130 Hypertensive heart and chronic kidney disease with heart failure and stage 1 through stage 4 chronic kidney disease, or unspecified chronic kidney disease: Secondary | ICD-10-CM | POA: Diagnosis not present

## 2018-11-19 DIAGNOSIS — N183 Chronic kidney disease, stage 3 unspecified: Secondary | ICD-10-CM | POA: Diagnosis not present

## 2018-11-19 DIAGNOSIS — I2510 Atherosclerotic heart disease of native coronary artery without angina pectoris: Secondary | ICD-10-CM | POA: Diagnosis not present

## 2018-11-19 DIAGNOSIS — E0590 Thyrotoxicosis, unspecified without thyrotoxic crisis or storm: Secondary | ICD-10-CM

## 2018-11-19 DIAGNOSIS — Z7189 Other specified counseling: Secondary | ICD-10-CM | POA: Diagnosis not present

## 2018-11-19 DIAGNOSIS — G3184 Mild cognitive impairment, so stated: Secondary | ICD-10-CM | POA: Diagnosis not present

## 2018-11-19 DIAGNOSIS — N184 Chronic kidney disease, stage 4 (severe): Secondary | ICD-10-CM

## 2018-11-19 DIAGNOSIS — R0602 Shortness of breath: Secondary | ICD-10-CM | POA: Diagnosis not present

## 2018-11-19 DIAGNOSIS — J4520 Mild intermittent asthma, uncomplicated: Secondary | ICD-10-CM | POA: Diagnosis not present

## 2018-11-19 NOTE — Progress Notes (Signed)
Provider: Iley Breeden FNP-C  Gayland Curry, DO  Patient Care Team: Gayland Curry, DO as PCP - General (Geriatric Medicine)  Extended Emergency Contact Information Primary Emergency Contact: Moncrief,[name] Address: [place], [place] Phone: [phone] Mobile Phone: [phone] Relation: Son  Code Status: Full Code  Goals of care: Advanced Directive information Advanced Directives 02/11/2018  Does Patient Have a Medical Advance Directive? No  Type of Advance Directive -  Does patient want to make changes to medical advance directive? -  Would patient like information on creating a medical advance directive? No - Patient declined     Chief Complaint  Patient presents with  . Acute Visit    Shortness of Breath patient states it just started getting out of car today coming in for lab appointment     HPI:  Pt is a 86 y.o. female seen today for an acute visit for evaluation of Shortness of Breath since this morning.Patient's son states it just started this morning when patient was getting out of car today coming in for lab appointment.she denies any wheezing or cough.she takes her Furosemide 20 mg tablet three times per week.she took furosemide yesterday due tomorrow. She has had a 4 lbs weight gain since last visit 10/24/2018.she denies any fever,chills or decreased in urine amounts. She was seen by MD 10/24/2018 for follow up Hospital admission for shortness of breath.she was referred to follow up with [facility] clinic but son states still waiting for a call from the CHF clinic.    Past Medical History:  Diagnosis Date  . Aneurysm (arteriovenous) of coronary vessels    ascending aorta requiring bypass of the LAD  . Carotid stenosis 04/28/08   Doppler: <40% stenosis bilateral  . Chronic kidney disease, stage 3   . Hyperlipemia   . Hypertension   . Hypertensive chronic kidney disease   .  Hypothyroidism, unspecified 02/13/2018   Symptoms well controlled with current therapy  . Mild cognitive impairment, so stated 03/28/2018   Symptoms poorly controlled, needs frequent adjustments in treatment and dose monitoring  . Mild intermittent asthma    Symptoms controlled with difficulty, affecting daily functioning, needs ongoing monitoring  . Occlusion and stenosis of bilateral carotid arteries   . Pacemaker generator end of life 11/18/08   Intermittent high-grade atrioventricular block  . Peripheral arterial disease (HCC)    left ABI of 0.78  . Presence of permanent cardiac pacemaker 08/26/01   Sinus node dysfunction-[facility]  . Presence of xenogenic heart valve   . PVD (peripheral vascular disease) ([facility])   . SSS (sick sinus syndrome) ([facility])   . Status post ascending aortic aneurysm repair/AVR -  Medtronic Freestyle root 09/28/2014   ascending aorta requiring bypass of the LAD    Past Surgical History:  Procedure Laterality Date  . ABDOMINAL HYSTERECTOMY  1966  . APPENDECTOMY  1943  . ASCENDING AORTIC ANEURYSM REPAIR  09/22/97   porcine aortic root  . BREAST FIBROADENOMA SURGERY  11/89  . CARDIAC SURGERY  09/22/1997   aortic valve root repair with porcine at time of CABG  . CORONARY ARTERY BYPASS GRAFT  09/22/97   LIMA to the LAD  . I&D EXTREMITY Left 08/15/2017   Procedure: IRRIGATION AND DEBRIDEMENT EXTREMITY;  Surgeon: Nicholes Stairs, MD;  Location: [facility];  Service: Orthopedics;  Laterality: Left;  . Circle   left  . NEPHRECTOMY  1958   right  . ORIF HUMERUS FRACTURE Left 08/15/2017   Procedure: OPEN REDUCTION INTERNAL FIXATION (ORIF) PROXIMAL HUMERUS FRACTURE;  Surgeon: Nicholes Stairs, MD;  Location: [facility];  Service: Orthopedics;  Laterality: Left;  . OVARIAN CYST SURGERY  1948  . PACEMAKER GENERATOR CHANGE  11/18/08   [facility]  . PACEMAKER INSERTION  08/26/01   [facility]  . PPM GENERATOR CHANGEOUT N/A 05/16/2017   Procedure: PPM GENERATOR  CHANGEOUT;  Surgeon: Sanda Klein, MD;  Location: [facility];  Service: Cardiovascular;  Laterality: N/A;    Allergies  Allergen Reactions  . Accupril [Quinapril Hcl] Other (See Comments)    Unknown reaction   . Amlodipine Swelling  . Benadryl [Diphenhydramine Hcl] Other (See Comments)    Unknown reaction   . Biaxin [Clarithromycin] Other (See Comments)    Unknown reaction   . Ciprofloxacin Other (See Comments)    Unknown reaction   . Codeine Other (See Comments)    Unknown reaction   . Diovan [Valsartan] Other (See Comments)    Unknown reaction   . Medrol [Methylprednisolone] Other (See Comments)    Unknown  . Morphine And Related Other (See Comments)    Unknown reaction   . Neurontin [Gabapentin] Other (See Comments)    Unknown reaction   . Penicillins     Unknown reaction  Has patient had a PCN reaction causing immediate rash, facial/tongue/throat swelling, SOB or lightheadedness with hypotension: Unknown Has patient had a PCN reaction causing severe rash involving mucus membranes or skin necrosis: Unknown Has patient had a PCN reaction that required hospitalization: Unknown Has patient had a PCN reaction occurring within the last 10 years: No If all of the above answers are "NO", then may proceed with Cephalosporin use.     Outpatient Encounter Medications as of 11/19/2018  Medication Sig  . acetaminophen (TYLENOL) 500 MG tablet Take 500 mg by mouth every 6 (six) hours as needed for mild pain or headache.  . albuterol (ACCUNEB) 0.63 MG/3ML nebulizer solution Take 1 ampule by nebulization every 6 (six) hours as needed for wheezing.  Marland Kitchen aspirin 325 MG tablet Take 81 mg by mouth daily.   . brimonidine (ALPHAGAN) 0.2 % ophthalmic solution Place 1 drop into both eyes 2 (two) times daily.   . carvedilol (COREG) 3.125 MG tablet Take 1 tablet (3.125 mg total) by mouth 2 (two) times daily with a meal.  . Cholecalciferol (VITAMIN D) 50 MCG (2000 UT) CAPS Take 1 capsule by  mouth daily.  . Coenzyme Q10 (COQ10) 200 MG CAPS Take 200 mg by mouth daily.  . furosemide (LASIX) 20 MG tablet Take 1 tablet (20 mg total) by mouth 3 (three) times a week. Mon/Wed/Fri  . methimazole (TAPAZOLE) 5 MG tablet Take 5 mg by mouth daily.  . vitamin B-12 (CYANOCOBALAMIN) 500 MCG tablet Take 500 mcg by mouth daily.   No facility-administered encounter medications on file as of 11/19/2018.     Review of Systems  Constitutional: Negative for appetite change, chills, fatigue and fever.  HENT: Negative for congestion, rhinorrhea, sinus pressure, sinus pain, sneezing and sore throat.   Respiratory: Negative for cough, chest tightness and wheezing.        Chronic shortness of breath with exertion.  Cardiovascular: Positive for leg swelling. Negative for chest pain and palpitations.  Gastrointestinal: Negative for abdominal distention, abdominal pain, constipation, diarrhea, nausea and vomiting.  Genitourinary: Negative for decreased urine volume, difficulty urinating, dysuria, flank pain and frequency.  Musculoskeletal: Positive for  gait problem.  Skin: Negative for color change, pallor and rash.  Neurological: Negative for dizziness, light-headedness, numbness and headaches.  Psychiatric/Behavioral: Negative for agitation. The patient is not nervous/anxious.     Immunization History  Administered Date(s) Administered  . Fluad Quad(high Dose 65+) 10/24/2018  . Influenza, High Dose Seasonal PF 11/16/2014, 11/09/2015  . Influenza-Unspecified 11/30/2010, 11/20/2011, 11/14/2012, 11/13/2017  . Pneumococcal Conjugate-13 03/28/2018  . Pneumococcal Polysaccharide-23 01/08/2006, 02/20/2012  . Tdap 07/23/2010, 08/14/2017   Pertinent  Health Maintenance Due  Topic Date Due  . DEXA SCAN  10/17/1989  . INFLUENZA VACCINE  Completed  . PNA vac Low Risk Adult  Completed   Fall Risk  11/19/2018 10/11/2018 09/26/2018 06/27/2018 03/28/2018  Falls in the past year? 0 0 0 0 0  Number falls in past  yr: 0 0 0 0 0  Injury with Fall? 0 0 0 0 0    Vitals:   11/19/18 1034  BP: 140/80  Pulse: 71  Resp: 20  Temp: (!) 96.9 F (36.1 C)  TempSrc: Temporal  SpO2: 95%  Weight: 96 lb (43.5 kg)  Height: 4\' 11"  (1.499 m)   Body mass index is 19.39 kg/m. Physical Exam Vitals signs reviewed.  Constitutional:      General: She is not in acute distress.    Appearance: She is normal weight. She is not ill-appearing.  Eyes:     General: No scleral icterus.       Right eye: No discharge.        Left eye: No discharge.     Extraocular Movements: Extraocular movements intact.     Conjunctiva/sclera: Conjunctivae normal.     Pupils: Pupils are equal, round, and reactive to light.  Cardiovascular:     Rate and Rhythm: Normal rate and regular rhythm.     Pulses: Normal pulses.     Heart sounds: Murmur present. No friction rub. No gallop.   Pulmonary:     Effort: Pulmonary effort is normal. No respiratory distress.     Breath sounds: No wheezing, rhonchi or rales.     Comments: Bilateral lung bases diminished breath sounds  Chest:     Chest wall: No tenderness.  Abdominal:     General: Bowel sounds are normal. There is no distension.     Palpations: Abdomen is soft. There is no mass.     Tenderness: There is no abdominal tenderness. There is no right CVA tenderness, left CVA tenderness, guarding or rebound.  Musculoskeletal:     Comments: Gait steady with right hand cane.bilateral lower extremities 2+ edema.   Skin:    General: Skin is warm.     Coloration: Skin is pale.     Findings: No bruising, erythema or rash.  Neurological:     Mental Status: Mental status is at baseline.     Cranial Nerves: No cranial nerve deficit.     Sensory: No sensory deficit.     Motor: No weakness.     Coordination: Coordination normal.     Gait: Gait abnormal.  Psychiatric:        Mood and Affect: Mood normal.        Behavior: Behavior normal.        Thought Content: Thought content normal.         Judgment: Judgment normal.     Labs reviewed: Recent Labs    10/11/18 2301  10/14/18 0543 10/15/18 0504 10/24/18 0909  NA  --    < > 139 138 141  K  --    < > 3.5 3.8 4.6  CL  --    < > 100 98 106  CO2  --    < > 29 30 31   GLUCOSE  --    < > 93 105* 106  BUN  --    < > 36* 36* 34*  CREATININE 1.66*   < > 1.68* 1.71* 1.70*  CALCIUM  --    < > 8.5* 9.0 8.9  MG 2.3  --   --  2.0  --   PHOS 4.2  --   --   --   --    < > = values in this interval not displayed.   Recent Labs    02/11/18 1049 09/26/18 1225 10/11/18 1715  AST 14 15 22   ALT 8 11 19   ALKPHOS  --   --  80  BILITOT 0.7 0.9 1.3*  PROT 6.4 6.6 6.9  ALBUMIN  --   --  3.8   Recent Labs    02/11/18 1049 09/26/18 1225  10/13/18 0416 10/14/18 0543 10/24/18 0909  WBC 5.4 6.6   < > 5.1 5.2 4.4  NEUTROABS 3,175 4,653  --   --   --  2,328  HGB 10.1* 10.4*   < > 10.4* 9.7* 9.6*  HCT 29.5* 31.9*   < > 30.9* 29.1* 29.5*  MCV 96.4 100.9*   < > 99.0 99.7 101.0*  PLT 225 210   < > 181 177 220   < > = values in this interval not displayed.   Lab Results  Component Value Date   TSH 8.881 (H) 10/11/2018   No results found for: HGBA1C Lab Results  Component Value Date   CHOL 153 09/13/2017   HDL 49 09/13/2017   LDLCALC 92 09/13/2017   TRIG 62 09/13/2017    Significant Diagnostic Results in last 30 days:  No results found.  Assessment/Plan 1. Chronic diastolic heart failure (HCC) Afebrile.Has had 4 lbs weight gain since previous visit.shortness of breath with exertion worsen today when walking from the car to the lab for lab work.No wheezing or cough.referred to CHF clinic by MD but son states waiting for a call from the clinic.Refer co-ordinate notified contacted [facility] but was on call waiting for long.Will give patient's son a call once she get through.continue fluid restriction.encouraged to cut down on high sodium cuisine dinners.will obtain CXR to rule out acute abnormalities. - DG Chest 2 View; Future   2.[facility] Goals of care planning   Code status previously discussed with MD on last visit patient was supposed to think about it but son states states have not yet made any decision.given her frail  advance age,CHF and one kidney might need to consider palliative care.Patient and son will continue to think about it.   Family/ staff Communication: Reviewed plan of care with patient and son.  Labs/tests ordered:  - DG Chest 2 View; Future  Sandrea Hughs, NP

## 2018-11-19 NOTE — Patient Instructions (Signed)
Follow up with [facility]

## 2018-11-20 DIAGNOSIS — I2510 Atherosclerotic heart disease of native coronary artery without angina pectoris: Secondary | ICD-10-CM | POA: Diagnosis not present

## 2018-11-20 DIAGNOSIS — G3184 Mild cognitive impairment, so stated: Secondary | ICD-10-CM | POA: Diagnosis not present

## 2018-11-20 DIAGNOSIS — N183 Chronic kidney disease, stage 3 unspecified: Secondary | ICD-10-CM | POA: Diagnosis not present

## 2018-11-20 DIAGNOSIS — I130 Hypertensive heart and chronic kidney disease with heart failure and stage 1 through stage 4 chronic kidney disease, or unspecified chronic kidney disease: Secondary | ICD-10-CM | POA: Diagnosis not present

## 2018-11-20 DIAGNOSIS — I5033 Acute on chronic diastolic (congestive) heart failure: Secondary | ICD-10-CM | POA: Diagnosis not present

## 2018-11-20 DIAGNOSIS — J4520 Mild intermittent asthma, uncomplicated: Secondary | ICD-10-CM | POA: Diagnosis not present

## 2018-11-20 LAB — CBC WITH DIFFERENTIAL/PLATELET
Absolute Monocytes: 1015 cells/uL — ABNORMAL HIGH (ref 200–950)
Basophils Absolute: 30 cells/uL (ref 0–200)
Basophils Relative: 0.5 %
Eosinophils Absolute: 100 cells/uL (ref 15–500)
Eosinophils Relative: 1.7 %
HCT: 31 % — ABNORMAL LOW (ref 35.0–45.0)
Hemoglobin: 10.2 g/dL — ABNORMAL LOW (ref 11.7–15.5)
Lymphs Abs: 1770 cells/uL (ref 850–3900)
MCH: 33.3 pg — ABNORMAL HIGH (ref 27.0–33.0)
MCHC: 32.9 g/dL (ref 32.0–36.0)
MCV: 101.3 fL — ABNORMAL HIGH (ref 80.0–100.0)
MPV: 12 fL (ref 7.5–12.5)
Monocytes Relative: 17.2 %
Neutro Abs: 2985 cells/uL (ref 1500–7800)
Neutrophils Relative %: 50.6 %
Platelets: 132 10*3/uL — ABNORMAL LOW (ref 140–400)
RBC: 3.06 10*6/uL — ABNORMAL LOW (ref 3.80–5.10)
RDW: 15 % (ref 11.0–15.0)
Total Lymphocyte: 30 %
WBC: 5.9 10*3/uL (ref 3.8–10.8)

## 2018-11-20 LAB — COMPLETE METABOLIC PANEL WITH GFR
AG Ratio: 1.4 (calc) (ref 1.0–2.5)
ALT: 10 U/L (ref 6–29)
AST: 17 U/L (ref 10–35)
Albumin: 3.9 g/dL (ref 3.6–5.1)
Alkaline phosphatase (APISO): 74 U/L (ref 37–153)
BUN/Creatinine Ratio: 24 (calc) — ABNORMAL HIGH (ref 6–22)
BUN: 35 mg/dL — ABNORMAL HIGH (ref 7–25)
CO2: 29 mmol/L (ref 20–32)
Calcium: 9.3 mg/dL (ref 8.6–10.4)
Chloride: 104 mmol/L (ref 98–110)
Creat: 1.44 mg/dL — ABNORMAL HIGH (ref 0.60–0.88)
GFR, Est African American: 36 mL/min/{1.73_m2} — ABNORMAL LOW (ref >=60)
GFR, Est Non African American: 31 mL/min/{1.73_m2} — ABNORMAL LOW (ref >=60)
Globulin: 2.7 g/dL (calc) (ref 1.9–3.7)
Glucose, Bld: 117 mg/dL — ABNORMAL HIGH (ref 65–99)
Potassium: 4.4 mmol/L (ref 3.5–5.3)
Sodium: 140 mmol/L (ref 135–146)
Total Bilirubin: 1.2 mg/dL (ref 0.2–1.2)
Total Protein: 6.6 g/dL (ref 6.1–8.1)

## 2018-11-20 LAB — TSH: TSH: 14.08 mIU/L — ABNORMAL HIGH (ref 0.40–4.50)

## 2018-11-20 LAB — T3, FREE: T3, Free: 2.7 pg/mL (ref 2.3–4.2)

## 2018-11-20 LAB — T4, FREE: Free T4: 0.8 ng/dL (ref 0.8–1.8)

## 2018-11-21 DIAGNOSIS — I130 Hypertensive heart and chronic kidney disease with heart failure and stage 1 through stage 4 chronic kidney disease, or unspecified chronic kidney disease: Secondary | ICD-10-CM | POA: Diagnosis not present

## 2018-11-21 DIAGNOSIS — I2510 Atherosclerotic heart disease of native coronary artery without angina pectoris: Secondary | ICD-10-CM | POA: Diagnosis not present

## 2018-11-21 DIAGNOSIS — G3184 Mild cognitive impairment, so stated: Secondary | ICD-10-CM | POA: Diagnosis not present

## 2018-11-21 DIAGNOSIS — N183 Chronic kidney disease, stage 3 unspecified: Secondary | ICD-10-CM | POA: Diagnosis not present

## 2018-11-21 DIAGNOSIS — J4520 Mild intermittent asthma, uncomplicated: Secondary | ICD-10-CM | POA: Diagnosis not present

## 2018-11-21 DIAGNOSIS — I5033 Acute on chronic diastolic (congestive) heart failure: Secondary | ICD-10-CM | POA: Diagnosis not present

## 2018-11-22 ENCOUNTER — Other Ambulatory Visit: Payer: Self-pay | Admitting: *Deleted

## 2018-11-22 DIAGNOSIS — G3184 Mild cognitive impairment, so stated: Secondary | ICD-10-CM | POA: Diagnosis not present

## 2018-11-22 DIAGNOSIS — I130 Hypertensive heart and chronic kidney disease with heart failure and stage 1 through stage 4 chronic kidney disease, or unspecified chronic kidney disease: Secondary | ICD-10-CM | POA: Diagnosis not present

## 2018-11-22 DIAGNOSIS — I5033 Acute on chronic diastolic (congestive) heart failure: Secondary | ICD-10-CM | POA: Diagnosis not present

## 2018-11-22 DIAGNOSIS — J4520 Mild intermittent asthma, uncomplicated: Secondary | ICD-10-CM | POA: Diagnosis not present

## 2018-11-22 DIAGNOSIS — I2510 Atherosclerotic heart disease of native coronary artery without angina pectoris: Secondary | ICD-10-CM | POA: Diagnosis not present

## 2018-11-22 DIAGNOSIS — N183 Chronic kidney disease, stage 3 unspecified: Secondary | ICD-10-CM | POA: Diagnosis not present

## 2018-11-22 MED ORDER — METHIMAZOLE 5 MG PO TABS: 5.0000 mg | ORAL_TABLET | Freq: Every day | ORAL | 1 refills | Status: DC

## 2018-11-22 NOTE — Telephone Encounter (Signed)
Son called and requested refill.

## 2018-11-25 DIAGNOSIS — N183 Chronic kidney disease, stage 3 unspecified: Secondary | ICD-10-CM | POA: Diagnosis not present

## 2018-11-25 DIAGNOSIS — I2510 Atherosclerotic heart disease of native coronary artery without angina pectoris: Secondary | ICD-10-CM | POA: Diagnosis not present

## 2018-11-25 DIAGNOSIS — J4520 Mild intermittent asthma, uncomplicated: Secondary | ICD-10-CM | POA: Diagnosis not present

## 2018-11-25 DIAGNOSIS — G3184 Mild cognitive impairment, so stated: Secondary | ICD-10-CM | POA: Diagnosis not present

## 2018-11-25 DIAGNOSIS — I5033 Acute on chronic diastolic (congestive) heart failure: Secondary | ICD-10-CM | POA: Diagnosis not present

## 2018-11-25 DIAGNOSIS — I130 Hypertensive heart and chronic kidney disease with heart failure and stage 1 through stage 4 chronic kidney disease, or unspecified chronic kidney disease: Secondary | ICD-10-CM | POA: Diagnosis not present

## 2018-11-26 DIAGNOSIS — G3184 Mild cognitive impairment, so stated: Secondary | ICD-10-CM | POA: Diagnosis not present

## 2018-11-26 DIAGNOSIS — I130 Hypertensive heart and chronic kidney disease with heart failure and stage 1 through stage 4 chronic kidney disease, or unspecified chronic kidney disease: Secondary | ICD-10-CM | POA: Diagnosis not present

## 2018-11-26 DIAGNOSIS — I2510 Atherosclerotic heart disease of native coronary artery without angina pectoris: Secondary | ICD-10-CM | POA: Diagnosis not present

## 2018-11-26 DIAGNOSIS — J4520 Mild intermittent asthma, uncomplicated: Secondary | ICD-10-CM | POA: Diagnosis not present

## 2018-11-26 DIAGNOSIS — N183 Chronic kidney disease, stage 3 unspecified: Secondary | ICD-10-CM | POA: Diagnosis not present

## 2018-11-26 DIAGNOSIS — I5033 Acute on chronic diastolic (congestive) heart failure: Secondary | ICD-10-CM | POA: Diagnosis not present

## 2018-11-27 DIAGNOSIS — I130 Hypertensive heart and chronic kidney disease with heart failure and stage 1 through stage 4 chronic kidney disease, or unspecified chronic kidney disease: Secondary | ICD-10-CM | POA: Diagnosis not present

## 2018-11-27 DIAGNOSIS — N183 Chronic kidney disease, stage 3 unspecified: Secondary | ICD-10-CM | POA: Diagnosis not present

## 2018-11-27 DIAGNOSIS — I5033 Acute on chronic diastolic (congestive) heart failure: Secondary | ICD-10-CM | POA: Diagnosis not present

## 2018-11-27 DIAGNOSIS — G3184 Mild cognitive impairment, so stated: Secondary | ICD-10-CM | POA: Diagnosis not present

## 2018-11-27 DIAGNOSIS — J4520 Mild intermittent asthma, uncomplicated: Secondary | ICD-10-CM | POA: Diagnosis not present

## 2018-11-27 DIAGNOSIS — I2510 Atherosclerotic heart disease of native coronary artery without angina pectoris: Secondary | ICD-10-CM | POA: Diagnosis not present

## 2018-12-02 ENCOUNTER — Ambulatory Visit (INDEPENDENT_AMBULATORY_CARE_PROVIDER_SITE_OTHER): Payer: Medicare Other | Admitting: *Deleted

## 2018-12-02 DIAGNOSIS — I495 Sick sinus syndrome: Secondary | ICD-10-CM | POA: Diagnosis not present

## 2018-12-02 DIAGNOSIS — I441 Atrioventricular block, second degree: Secondary | ICD-10-CM

## 2018-12-03 DIAGNOSIS — J4520 Mild intermittent asthma, uncomplicated: Secondary | ICD-10-CM | POA: Diagnosis not present

## 2018-12-03 DIAGNOSIS — I5033 Acute on chronic diastolic (congestive) heart failure: Secondary | ICD-10-CM | POA: Diagnosis not present

## 2018-12-03 DIAGNOSIS — I130 Hypertensive heart and chronic kidney disease with heart failure and stage 1 through stage 4 chronic kidney disease, or unspecified chronic kidney disease: Secondary | ICD-10-CM | POA: Diagnosis not present

## 2018-12-03 DIAGNOSIS — G3184 Mild cognitive impairment, so stated: Secondary | ICD-10-CM | POA: Diagnosis not present

## 2018-12-03 DIAGNOSIS — N183 Chronic kidney disease, stage 3 unspecified: Secondary | ICD-10-CM | POA: Diagnosis not present

## 2018-12-03 DIAGNOSIS — I2510 Atherosclerotic heart disease of native coronary artery without angina pectoris: Secondary | ICD-10-CM | POA: Diagnosis not present

## 2018-12-03 LAB — CUP PACEART REMOTE DEVICE CHECK
Battery Remaining Longevity: 119 mo
Battery Remaining Percentage: 95.5 %
Battery Voltage: 2.99 V
Brady Statistic AP VP Percent: 28 %
Brady Statistic AP VS Percent: 1 %
Brady Statistic AS VP Percent: 49 %
Brady Statistic AS VS Percent: 22 %
Brady Statistic RA Percent Paced: 29 %
Brady Statistic RV Percent Paced: 77 %
Date Time Interrogation Session: 20201020060013
Implantable Lead Implant Date: 20030714
Implantable Lead Implant Date: 20030714
Implantable Lead Location: 753859
Implantable Lead Location: 753860
Implantable Pulse Generator Implant Date: 20190403
Lead Channel Impedance Value: 340 Ohm
Lead Channel Impedance Value: 440 Ohm
Lead Channel Pacing Threshold Amplitude: 0.75 V
Lead Channel Pacing Threshold Amplitude: 0.75 V
Lead Channel Pacing Threshold Pulse Width: 0.5 ms
Lead Channel Pacing Threshold Pulse Width: 0.5 ms
Lead Channel Sensing Intrinsic Amplitude: 0.5 mV
Lead Channel Sensing Intrinsic Amplitude: 2.9 mV
Lead Channel Setting Pacing Amplitude: 1 V
Lead Channel Setting Pacing Amplitude: 1.75 V
Lead Channel Setting Pacing Pulse Width: 0.5 ms
Lead Channel Setting Sensing Sensitivity: 0.7 mV
Pulse Gen Model: 2272
Pulse Gen Serial Number: 9004253

## 2018-12-04 ENCOUNTER — Telehealth: Payer: Self-pay | Admitting: *Deleted

## 2018-12-04 DIAGNOSIS — J4520 Mild intermittent asthma, uncomplicated: Secondary | ICD-10-CM | POA: Diagnosis not present

## 2018-12-04 DIAGNOSIS — G3184 Mild cognitive impairment, so stated: Secondary | ICD-10-CM | POA: Diagnosis not present

## 2018-12-04 DIAGNOSIS — N183 Chronic kidney disease, stage 3 unspecified: Secondary | ICD-10-CM | POA: Diagnosis not present

## 2018-12-04 DIAGNOSIS — I2510 Atherosclerotic heart disease of native coronary artery without angina pectoris: Secondary | ICD-10-CM | POA: Diagnosis not present

## 2018-12-04 DIAGNOSIS — I5033 Acute on chronic diastolic (congestive) heart failure: Secondary | ICD-10-CM | POA: Diagnosis not present

## 2018-12-04 DIAGNOSIS — I130 Hypertensive heart and chronic kidney disease with heart failure and stage 1 through stage 4 chronic kidney disease, or unspecified chronic kidney disease: Secondary | ICD-10-CM | POA: Diagnosis not present

## 2018-12-04 NOTE — Telephone Encounter (Signed)
Beverley Fiedler, Nurse with [facility] called and stated that she just wanted to let you know that since patient has gotten out of the hospital in Sept she has slowly gained 12lbs. Has Lower Extremity edema +2 pitting edema and ankles circumference went up to 4cm.    Next appointment 01/30/2019

## 2018-12-04 NOTE — Telephone Encounter (Signed)
I recommend she be scheduled for an acute visit asap with next available provider--please call her son Ed to schedule.

## 2018-12-04 NOTE — Telephone Encounter (Signed)
Spoke with son, Ed. Patient scheduled to come into office tomorrow to see [facility]

## 2018-12-05 ENCOUNTER — Encounter: Payer: Self-pay | Admitting: Family

## 2018-12-05 ENCOUNTER — Other Ambulatory Visit: Payer: Self-pay

## 2018-12-05 ENCOUNTER — Ambulatory Visit (INDEPENDENT_AMBULATORY_CARE_PROVIDER_SITE_OTHER): Payer: Medicare Other | Admitting: Family

## 2018-12-05 DIAGNOSIS — I745 Embolism and thrombosis of iliac artery: Secondary | ICD-10-CM | POA: Diagnosis not present

## 2018-12-05 DIAGNOSIS — I5032 Chronic diastolic (congestive) heart failure: Secondary | ICD-10-CM | POA: Diagnosis not present

## 2018-12-05 MED ORDER — FUROSEMIDE 20 MG PO TABS: 20.0000 mg | ORAL_TABLET | Freq: Every day | ORAL | 3 refills | Status: DC

## 2018-12-05 NOTE — Progress Notes (Signed)
Provider: Bhavik Cabiness FNP-C  Gayland Curry, DO  Patient Care Team: Gayland Curry, DO as PCP - General (Geriatric Medicine)  Extended Emergency Contact Information Primary Emergency Contact: Huseby,[name] Address: [employer], [place] Phone: [phone] Mobile Phone: [phone] Relation: Son  Code Status: Full Code  Goals of care: Advanced Directive information Advanced Directives 02/11/2018  Does Patient Have a Medical Advance Directive? No  Type of Advance Directive -  Does patient want to make changes to medical advance directive? -  Would patient like information on creating a medical advance directive? No - Patient declined     Chief Complaint  Patient presents with  . Acute Visit    Patient has had weight gain of 2 or more pounds with pitted edema patient states this has been going on for about a month     HPI:  Pt is a 85 y.o. female seen today at [facility] office for an acute visit for evaluation of weight gain over two weeks ,edema and shortness of breath with exertion X 1 month.she is status post hospital admission with similar symptoms in 10/11/2018.she is currently on furosemide 20 mg tablet one by mouth three times per week.she has a significant medical history of  due to chronic diastolic congestive heart failure,CAD S/p CABG,SSS,CKD stage 4 ,s/p right Nephrectomy,among other conditions.shedenies any fever,chills or cough.      Past Medical History:  Diagnosis Date  . Aneurysm (arteriovenous) of coronary vessels    ascending aorta requiring bypass of the LAD  . Carotid stenosis 04/28/08   Doppler: <40% stenosis bilateral  . Chronic kidney disease, stage 3   . Hyperlipemia   . Hypertension   . Hypertensive chronic kidney disease   . Hypothyroidism, unspecified 02/13/2018   Symptoms well controlled with current therapy  . Mild cognitive impairment, so stated 03/28/2018   Symptoms poorly controlled,  needs frequent adjustments in treatment and dose monitoring  . Mild intermittent asthma    Symptoms controlled with difficulty, affecting daily functioning, needs ongoing monitoring  . Occlusion and stenosis of bilateral carotid arteries   . Pacemaker generator end of life 11/18/08   Intermittent high-grade atrioventricular block  . Peripheral arterial disease (HCC)    left ABI of 0.78  . Presence of permanent cardiac pacemaker 08/26/01   Sinus node dysfunction-[place]  . Presence of xenogenic heart valve   . PVD (peripheral vascular disease) ([place])   . SSS (sick sinus syndrome) ([place])   . Status post ascending aortic aneurysm repair/AVR -  Medtronic Freestyle root 09/28/2014   ascending aorta requiring bypass of the LAD    Past Surgical History:  Procedure Laterality Date  . ABDOMINAL HYSTERECTOMY  1966  . APPENDECTOMY  1943  . ASCENDING AORTIC ANEURYSM REPAIR  09/22/97   porcine aortic root  . BREAST FIBROADENOMA SURGERY  11/89  . CARDIAC SURGERY  09/22/1997   aortic valve root repair with porcine at time of CABG  . CORONARY ARTERY BYPASS GRAFT  09/22/97   LIMA to the LAD  . I&D EXTREMITY Left 08/15/2017   Procedure: IRRIGATION AND DEBRIDEMENT EXTREMITY;  Surgeon: Nicholes Stairs, MD;  Location: [facility];  Service: Orthopedics;  Laterality: Left;  . [place]   left  . NEPHRECTOMY  1958   right  . ORIF HUMERUS FRACTURE Left 08/15/2017   Procedure: OPEN REDUCTION INTERNAL FIXATION (ORIF) PROXIMAL HUMERUS FRACTURE;  Surgeon: Stann Mainland,  Elly Modena, MD;  Location: [facility];  Service: Orthopedics;  Laterality: Left;  . OVARIAN CYST SURGERY  1948  . PACEMAKER GENERATOR CHANGE  11/18/08   [place]  . PACEMAKER INSERTION  08/26/01   [place]  . PPM GENERATOR CHANGEOUT N/A 05/16/2017   Procedure: PPM GENERATOR CHANGEOUT;  Surgeon: Sanda Klein, MD;  Location: [facility];  Service: Cardiovascular;  Laterality: N/A;    Allergies  Allergen Reactions  . Accupril  [Quinapril Hcl] Other (See Comments)    Unknown reaction   . Amlodipine Swelling  . Benadryl [Diphenhydramine Hcl] Other (See Comments)    Unknown reaction   . Biaxin [Clarithromycin] Other (See Comments)    Unknown reaction   . Ciprofloxacin Other (See Comments)    Unknown reaction   . Codeine Other (See Comments)    Unknown reaction   . Diovan [Valsartan] Other (See Comments)    Unknown reaction   . Medrol [Methylprednisolone] Other (See Comments)    Unknown  . Morphine And Related Other (See Comments)    Unknown reaction   . Neurontin [Gabapentin] Other (See Comments)    Unknown reaction   . Penicillins     Unknown reaction  Has patient had a PCN reaction causing immediate rash, facial/tongue/throat swelling, SOB or lightheadedness with hypotension: Unknown Has patient had a PCN reaction causing severe rash involving mucus membranes or skin necrosis: Unknown Has patient had a PCN reaction that required hospitalization: Unknown Has patient had a PCN reaction occurring within the last 10 years: No If all of the above answers are "NO", then may proceed with Cephalosporin use.     Outpatient Encounter Medications as of 12/05/2018  Medication Sig  . acetaminophen (TYLENOL) 500 MG tablet Take 500 mg by mouth every 6 (six) hours as needed for mild pain or headache.  . albuterol (ACCUNEB) 0.63 MG/3ML nebulizer solution Take 1 ampule by nebulization every 6 (six) hours as needed for wheezing.  Marland Kitchen aspirin EC 81 MG tablet Take 81 mg by mouth daily.  . brimonidine (ALPHAGAN) 0.2 % ophthalmic solution Place 1 drop into both eyes 2 (two) times daily.   . carvedilol (COREG) 3.125 MG tablet Take 1 tablet (3.125 mg total) by mouth 2 (two) times daily with a meal.  . Cholecalciferol (VITAMIN D) 50 MCG (2000 UT) CAPS Take 1 capsule by mouth daily.  . Coenzyme Q10 (COQ10) 200 MG CAPS Take 200 mg by mouth daily.  . furosemide (LASIX) 20 MG tablet Take 1 tablet (20 mg total) by mouth daily. Take  one tablet by mouth daily x 5 days then resume one by mouth on Mon/Wed/Fri.Also Take 20 mg tablet one by mouth daily as needed for shortness of breath,> 3 lbs weight gain from 98 lbs or worsening edema.  . methimazole (TAPAZOLE) 5 MG tablet Take 1 tablet (5 mg total) by mouth daily.  . vitamin B-12 (CYANOCOBALAMIN) 500 MCG tablet Take 500 mcg by mouth daily.  . [DISCONTINUED] furosemide (LASIX) 20 MG tablet Take 1 tablet (20 mg total) by mouth 3 (three) times a week. Mon/Wed/Fri  . [DISCONTINUED] aspirin 325 MG tablet Take 81 mg by mouth daily.    No facility-administered encounter medications on file as of 12/05/2018.     Review of Systems  Constitutional: Positive for unexpected weight change. Negative for appetite change, chills, fatigue and fever.       2 lbs weight gain over two weeks  HENT: Negative for congestion, rhinorrhea, sinus pressure, sinus pain, sneezing and sore  throat.   Respiratory: Negative for cough, chest tightness and wheezing.        Shortness of breath with exertion   Cardiovascular: Positive for leg swelling. Negative for chest pain and palpitations.  Gastrointestinal: Negative for abdominal distention, abdominal pain, constipation, diarrhea, nausea and vomiting.  Genitourinary: Negative for decreased urine volume, difficulty urinating, dysuria, flank pain, frequency and urgency.  Musculoskeletal: Positive for arthralgias and gait problem.  Skin: Positive for pallor. Negative for color change and rash.  Neurological: Negative for dizziness, light-headedness, numbness and headaches.  Psychiatric/Behavioral: Negative for agitation and sleep disturbance. The patient is not nervous/anxious.     Immunization History  Administered Date(s) Administered  . Fluad Quad(high Dose 65+) 10/24/2018  . Influenza, High Dose Seasonal PF 11/16/2014, 11/09/2015  . Influenza-Unspecified 11/30/2010, 11/20/2011, 11/14/2012, 11/13/2017  . Pneumococcal Conjugate-13 03/28/2018  .  Pneumococcal Polysaccharide-23 01/08/2006, 02/20/2012  . Tdap 07/23/2010, 08/14/2017   Pertinent  Health Maintenance Due  Topic Date Due  . DEXA SCAN  10/17/1989  . INFLUENZA VACCINE  Completed  . PNA vac Low Risk Adult  Completed   Fall Risk  11/19/2018 10/11/2018 09/26/2018 06/27/2018 03/28/2018  Falls in the past year? 0 0 0 0 0  Number falls in past yr: 0 0 0 0 0  Injury with Fall? 0 0 0 0 0   Functional Status Survey:    Vitals:   12/05/18 1336  BP: 136/74  Pulse: 72  Temp: (!) 97.2 F (36.2 C)  TempSrc: Temporal  SpO2: 95%  Weight: 98 lb 9.6 oz (44.7 kg)  Height: 4\' 11"  (1.499 m)   Body mass index is 19.91 kg/m. Physical Exam Constitutional:      General: She is not in acute distress.    Appearance: She is normal weight. She is not ill-appearing.  HENT:     Mouth/Throat:     Mouth: Mucous membranes are moist.     Pharynx: Oropharynx is clear. No oropharyngeal exudate or posterior oropharyngeal erythema.  Eyes:     General: No scleral icterus.       Right eye: No discharge.        Left eye: No discharge.     Extraocular Movements: Extraocular movements intact.     Conjunctiva/sclera: Conjunctivae normal.     Pupils: Pupils are equal, round, and reactive to light.     Comments: Corrective lens in place   Neck:     Musculoskeletal: Normal range of motion. No neck rigidity or muscular tenderness.     Vascular: No carotid bruit.  Cardiovascular:     Rate and Rhythm: Normal rate and regular rhythm.     Pulses: Normal pulses.     Heart sounds: Murmur present. No friction rub. No gallop.   Pulmonary:     Effort: Pulmonary effort is normal. No respiratory distress.     Breath sounds: No wheezing, rhonchi or rales.  Chest:     Chest wall: No tenderness.  Abdominal:     General: Bowel sounds are normal. There is no distension.     Palpations: Abdomen is soft. There is no mass.     Tenderness: There is no abdominal tenderness. There is no right CVA tenderness, left  CVA tenderness, guarding or rebound.  Musculoskeletal:        General: No tenderness.     Right lower leg: Edema present.     Left lower leg: Edema present.     Comments: Unsteady gait walks with a cane   Lymphadenopathy:  Cervical: No cervical adenopathy.  Skin:    General: Skin is warm and dry.     Coloration: Skin is pale.     Findings: No bruising, erythema or rash.  Neurological:     Mental Status: She is alert and oriented to person, place, and time.     Cranial Nerves: No cranial nerve deficit.     Sensory: No sensory deficit.     Motor: No weakness.     Coordination: Coordination normal.     Gait: Gait abnormal.  Psychiatric:        Mood and Affect: Mood normal.        Behavior: Behavior normal.        Thought Content: Thought content normal.        Judgment: Judgment normal.     Labs reviewed: Recent Labs    10/11/18 2301  10/15/18 0504 10/24/18 0909 11/19/18 1018  NA  --    < > 138 141 140  K  --    < > 3.8 4.6 4.4  CL  --    < > 98 106 104  CO2  --    < > 30 31 29   GLUCOSE  --    < > 105* 106 117*  BUN  --    < > 36* 34* 35*  CREATININE 1.66*   < > 1.71* 1.70* 1.44*  CALCIUM  --    < > 9.0 8.9 9.3  MG 2.3  --  2.0  --   --   PHOS 4.2  --   --   --   --    < > = values in this interval not displayed.   Recent Labs    09/26/18 1225 10/11/18 1715 11/19/18 1018  AST 15 22 17   ALT 11 19 10   ALKPHOS  --  80  --   BILITOT 0.9 1.3* 1.2  PROT 6.6 6.9 6.6  ALBUMIN  --  3.8  --    Recent Labs    09/26/18 1225  10/14/18 0543 10/24/18 0909 11/19/18 1018  WBC 6.6   < > 5.2 4.4 5.9  NEUTROABS 4,653  --   --  2,328 2,985  HGB 10.4*   < > 9.7* 9.6* 10.2*  HCT 31.9*   < > 29.1* 29.5* 31.0*  MCV 100.9*   < > 99.7 101.0* 101.3*  PLT 210   < > 177 220 132*   < > = values in this interval not displayed.   Lab Results  Component Value Date   TSH 14.08 (H) 11/19/2018   No results found for: HGBA1C Lab Results  Component Value Date   CHOL 153  09/13/2017   HDL 49 09/13/2017   LDLCALC 92 09/13/2017   TRIG 62 09/13/2017    Significant Diagnostic Results in last 30 days:  Dg Chest 2 View  Result Date: 11/19/2018 CLINICAL DATA:  Shortness of breath. EXAM: CHEST - 2 VIEW COMPARISON:  10/14/2018 FINDINGS: Right-sided pacemaker unchanged. Lungs are adequately inflated with moderate size left pleural effusion likely with associated compressive atelectasis of without significant change. Mild stable cardiomegaly. Remainder the exam is unchanged. IMPRESSION: Stable moderate size left pleural effusion likely with associated basilar atelectasis. Stable cardiomegaly. Electronically Signed   By: Marin Olp M.D.   On: 11/19/2018 16:43    Assessment/Plan   Chronic diastolic heart failure ([place]) Has had 2 lbs weight gain since previous visit,shortness of breath and edema. - furosemide (LASIX) 20 MG tablet; Take 1  tablet (20 mg total) by mouth daily. Take one tablet by mouth daily x 5 days then resume one by mouth on Mon/Wed/Fri.Also Take 20 mg tablet one by mouth daily as needed for shortness of breath,> 3 lbs weight gain from 98 lbs or worsening edema.  Dispense: 30 tablet; Refill: 3  Family/ staff Communication: Reviewed plan of care with patient and son  Labs/tests ordered: None   Toa Mia C Mckenzee Beem, NP

## 2018-12-05 NOTE — Patient Instructions (Addendum)
1.Take furosemide (LASIX) 20 MG tablet; Take 1 tablet (20 mg total) by mouth daily for 5 days then then resume one by mouth on Mon/Wed/Fri.  2. Also Take 20 mg tablet one by mouth daily as needed for shortness of breath ,> 3 lbs weight gain from 98 lbs or worsening edema.  3. Notify provider's office if shortness of breath or edema worsen.

## 2018-12-10 DIAGNOSIS — I130 Hypertensive heart and chronic kidney disease with heart failure and stage 1 through stage 4 chronic kidney disease, or unspecified chronic kidney disease: Secondary | ICD-10-CM | POA: Diagnosis not present

## 2018-12-10 DIAGNOSIS — N183 Chronic kidney disease, stage 3 unspecified: Secondary | ICD-10-CM | POA: Diagnosis not present

## 2018-12-10 DIAGNOSIS — G3184 Mild cognitive impairment, so stated: Secondary | ICD-10-CM | POA: Diagnosis not present

## 2018-12-10 DIAGNOSIS — I2510 Atherosclerotic heart disease of native coronary artery without angina pectoris: Secondary | ICD-10-CM | POA: Diagnosis not present

## 2018-12-10 DIAGNOSIS — I5033 Acute on chronic diastolic (congestive) heart failure: Secondary | ICD-10-CM | POA: Diagnosis not present

## 2018-12-10 DIAGNOSIS — J4520 Mild intermittent asthma, uncomplicated: Secondary | ICD-10-CM | POA: Diagnosis not present

## 2018-12-11 DIAGNOSIS — G3184 Mild cognitive impairment, so stated: Secondary | ICD-10-CM | POA: Diagnosis not present

## 2018-12-11 DIAGNOSIS — I2510 Atherosclerotic heart disease of native coronary artery without angina pectoris: Secondary | ICD-10-CM | POA: Diagnosis not present

## 2018-12-11 DIAGNOSIS — J4520 Mild intermittent asthma, uncomplicated: Secondary | ICD-10-CM | POA: Diagnosis not present

## 2018-12-11 DIAGNOSIS — I5033 Acute on chronic diastolic (congestive) heart failure: Secondary | ICD-10-CM | POA: Diagnosis not present

## 2018-12-11 DIAGNOSIS — N183 Chronic kidney disease, stage 3 unspecified: Secondary | ICD-10-CM | POA: Diagnosis not present

## 2018-12-11 DIAGNOSIS — I130 Hypertensive heart and chronic kidney disease with heart failure and stage 1 through stage 4 chronic kidney disease, or unspecified chronic kidney disease: Secondary | ICD-10-CM | POA: Diagnosis not present

## 2018-12-15 DIAGNOSIS — E440 Moderate protein-calorie malnutrition: Secondary | ICD-10-CM | POA: Diagnosis not present

## 2018-12-15 DIAGNOSIS — I739 Peripheral vascular disease, unspecified: Secondary | ICD-10-CM

## 2018-12-15 DIAGNOSIS — G3184 Mild cognitive impairment, so stated: Secondary | ICD-10-CM

## 2018-12-15 DIAGNOSIS — Z953 Presence of xenogenic heart valve: Secondary | ICD-10-CM | POA: Diagnosis not present

## 2018-12-15 DIAGNOSIS — I441 Atrioventricular block, second degree: Secondary | ICD-10-CM

## 2018-12-15 DIAGNOSIS — Z950 Presence of cardiac pacemaker: Secondary | ICD-10-CM | POA: Diagnosis not present

## 2018-12-15 DIAGNOSIS — Z905 Acquired absence of kidney: Secondary | ICD-10-CM | POA: Diagnosis not present

## 2018-12-15 DIAGNOSIS — Z7982 Long term (current) use of aspirin: Secondary | ICD-10-CM | POA: Diagnosis not present

## 2018-12-15 DIAGNOSIS — E785 Hyperlipidemia, unspecified: Secondary | ICD-10-CM | POA: Diagnosis not present

## 2018-12-15 DIAGNOSIS — I6523 Occlusion and stenosis of bilateral carotid arteries: Secondary | ICD-10-CM | POA: Diagnosis not present

## 2018-12-15 DIAGNOSIS — E0590 Thyrotoxicosis, unspecified without thyrotoxic crisis or storm: Secondary | ICD-10-CM | POA: Diagnosis not present

## 2018-12-15 DIAGNOSIS — I2510 Atherosclerotic heart disease of native coronary artery without angina pectoris: Secondary | ICD-10-CM | POA: Diagnosis not present

## 2018-12-15 DIAGNOSIS — N183 Chronic kidney disease, stage 3 unspecified: Secondary | ICD-10-CM | POA: Diagnosis not present

## 2018-12-15 DIAGNOSIS — Z951 Presence of aortocoronary bypass graft: Secondary | ICD-10-CM | POA: Diagnosis not present

## 2018-12-15 DIAGNOSIS — Z8781 Personal history of (healed) traumatic fracture: Secondary | ICD-10-CM | POA: Diagnosis not present

## 2018-12-15 DIAGNOSIS — I130 Hypertensive heart and chronic kidney disease with heart failure and stage 1 through stage 4 chronic kidney disease, or unspecified chronic kidney disease: Secondary | ICD-10-CM | POA: Diagnosis not present

## 2018-12-15 DIAGNOSIS — I495 Sick sinus syndrome: Secondary | ICD-10-CM | POA: Diagnosis not present

## 2018-12-15 DIAGNOSIS — J4520 Mild intermittent asthma, uncomplicated: Secondary | ICD-10-CM

## 2018-12-15 DIAGNOSIS — I5033 Acute on chronic diastolic (congestive) heart failure: Secondary | ICD-10-CM | POA: Diagnosis not present

## 2018-12-16 ENCOUNTER — Telehealth: Payer: Self-pay | Admitting: *Deleted

## 2018-12-16 NOTE — Telephone Encounter (Signed)
Clair Gulling with [facility] called requesting verbal orders for PT 1x4wks, Verbal orders given.

## 2018-12-17 DIAGNOSIS — J4520 Mild intermittent asthma, uncomplicated: Secondary | ICD-10-CM | POA: Diagnosis not present

## 2018-12-17 DIAGNOSIS — N183 Chronic kidney disease, stage 3 unspecified: Secondary | ICD-10-CM | POA: Diagnosis not present

## 2018-12-17 DIAGNOSIS — I2510 Atherosclerotic heart disease of native coronary artery without angina pectoris: Secondary | ICD-10-CM | POA: Diagnosis not present

## 2018-12-17 DIAGNOSIS — I130 Hypertensive heart and chronic kidney disease with heart failure and stage 1 through stage 4 chronic kidney disease, or unspecified chronic kidney disease: Secondary | ICD-10-CM | POA: Diagnosis not present

## 2018-12-17 DIAGNOSIS — G3184 Mild cognitive impairment, so stated: Secondary | ICD-10-CM | POA: Diagnosis not present

## 2018-12-17 DIAGNOSIS — I5033 Acute on chronic diastolic (congestive) heart failure: Secondary | ICD-10-CM | POA: Diagnosis not present

## 2018-12-18 DIAGNOSIS — I130 Hypertensive heart and chronic kidney disease with heart failure and stage 1 through stage 4 chronic kidney disease, or unspecified chronic kidney disease: Secondary | ICD-10-CM | POA: Diagnosis not present

## 2018-12-18 DIAGNOSIS — N183 Chronic kidney disease, stage 3 unspecified: Secondary | ICD-10-CM | POA: Diagnosis not present

## 2018-12-18 DIAGNOSIS — I2510 Atherosclerotic heart disease of native coronary artery without angina pectoris: Secondary | ICD-10-CM | POA: Diagnosis not present

## 2018-12-18 DIAGNOSIS — J4520 Mild intermittent asthma, uncomplicated: Secondary | ICD-10-CM | POA: Diagnosis not present

## 2018-12-18 DIAGNOSIS — I5033 Acute on chronic diastolic (congestive) heart failure: Secondary | ICD-10-CM | POA: Diagnosis not present

## 2018-12-18 DIAGNOSIS — G3184 Mild cognitive impairment, so stated: Secondary | ICD-10-CM | POA: Diagnosis not present

## 2018-12-20 ENCOUNTER — Other Ambulatory Visit: Payer: Self-pay | Admitting: *Deleted

## 2018-12-20 MED ORDER — METHIMAZOLE 5 MG PO TABS: 5.0000 mg | ORAL_TABLET | Freq: Every day | ORAL | 1 refills | Status: DC

## 2018-12-20 NOTE — Progress Notes (Signed)
Remote pacemaker transmission.   

## 2018-12-20 NOTE — Telephone Encounter (Signed)
[place]

## 2018-12-23 DIAGNOSIS — I2510 Atherosclerotic heart disease of native coronary artery without angina pectoris: Secondary | ICD-10-CM | POA: Diagnosis not present

## 2018-12-23 DIAGNOSIS — J4520 Mild intermittent asthma, uncomplicated: Secondary | ICD-10-CM | POA: Diagnosis not present

## 2018-12-23 DIAGNOSIS — G3184 Mild cognitive impairment, so stated: Secondary | ICD-10-CM | POA: Diagnosis not present

## 2018-12-23 DIAGNOSIS — I5033 Acute on chronic diastolic (congestive) heart failure: Secondary | ICD-10-CM | POA: Diagnosis not present

## 2018-12-23 DIAGNOSIS — N183 Chronic kidney disease, stage 3 unspecified: Secondary | ICD-10-CM | POA: Diagnosis not present

## 2018-12-23 DIAGNOSIS — I130 Hypertensive heart and chronic kidney disease with heart failure and stage 1 through stage 4 chronic kidney disease, or unspecified chronic kidney disease: Secondary | ICD-10-CM | POA: Diagnosis not present

## 2018-12-24 DIAGNOSIS — I2510 Atherosclerotic heart disease of native coronary artery without angina pectoris: Secondary | ICD-10-CM | POA: Diagnosis not present

## 2018-12-24 DIAGNOSIS — G3184 Mild cognitive impairment, so stated: Secondary | ICD-10-CM | POA: Diagnosis not present

## 2018-12-24 DIAGNOSIS — I5033 Acute on chronic diastolic (congestive) heart failure: Secondary | ICD-10-CM | POA: Diagnosis not present

## 2018-12-24 DIAGNOSIS — J4520 Mild intermittent asthma, uncomplicated: Secondary | ICD-10-CM | POA: Diagnosis not present

## 2018-12-24 DIAGNOSIS — I130 Hypertensive heart and chronic kidney disease with heart failure and stage 1 through stage 4 chronic kidney disease, or unspecified chronic kidney disease: Secondary | ICD-10-CM | POA: Diagnosis not present

## 2018-12-24 DIAGNOSIS — N183 Chronic kidney disease, stage 3 unspecified: Secondary | ICD-10-CM | POA: Diagnosis not present

## 2018-12-25 ENCOUNTER — Telehealth: Payer: Self-pay

## 2018-12-25 NOTE — Telephone Encounter (Signed)
Michele RN form [facility] called for a verbal order for patient to have their care once a week for 6 weeks

## 2018-12-31 ENCOUNTER — Telehealth: Payer: Self-pay | Admitting: *Deleted

## 2018-12-31 DIAGNOSIS — N183 Chronic kidney disease, stage 3 unspecified: Secondary | ICD-10-CM | POA: Diagnosis not present

## 2018-12-31 DIAGNOSIS — I130 Hypertensive heart and chronic kidney disease with heart failure and stage 1 through stage 4 chronic kidney disease, or unspecified chronic kidney disease: Secondary | ICD-10-CM | POA: Diagnosis not present

## 2018-12-31 DIAGNOSIS — I5033 Acute on chronic diastolic (congestive) heart failure: Secondary | ICD-10-CM | POA: Diagnosis not present

## 2018-12-31 DIAGNOSIS — J4520 Mild intermittent asthma, uncomplicated: Secondary | ICD-10-CM | POA: Diagnosis not present

## 2018-12-31 DIAGNOSIS — G3184 Mild cognitive impairment, so stated: Secondary | ICD-10-CM | POA: Diagnosis not present

## 2018-12-31 DIAGNOSIS — I2510 Atherosclerotic heart disease of native coronary artery without angina pectoris: Secondary | ICD-10-CM | POA: Diagnosis not present

## 2018-12-31 NOTE — Telephone Encounter (Signed)
Noted, if this trend continues, she becomes more short of breath or develops abdominal or leg swelling, we will need to adjust her diuretic therapy.

## 2018-12-31 NOTE — Telephone Encounter (Signed)
Son Notified.

## 2018-12-31 NOTE — Telephone Encounter (Signed)
Sharyn Lull with [facility] called and left message on Clinical Intake and stated that she just wanted to let you know that patient's weight last week was 93lb and this week it is 95lb.   No other concerns noted.

## 2019-01-06 DIAGNOSIS — I2510 Atherosclerotic heart disease of native coronary artery without angina pectoris: Secondary | ICD-10-CM | POA: Diagnosis not present

## 2019-01-06 DIAGNOSIS — I130 Hypertensive heart and chronic kidney disease with heart failure and stage 1 through stage 4 chronic kidney disease, or unspecified chronic kidney disease: Secondary | ICD-10-CM | POA: Diagnosis not present

## 2019-01-06 DIAGNOSIS — G3184 Mild cognitive impairment, so stated: Secondary | ICD-10-CM | POA: Diagnosis not present

## 2019-01-06 DIAGNOSIS — I5033 Acute on chronic diastolic (congestive) heart failure: Secondary | ICD-10-CM | POA: Diagnosis not present

## 2019-01-06 DIAGNOSIS — J4520 Mild intermittent asthma, uncomplicated: Secondary | ICD-10-CM | POA: Diagnosis not present

## 2019-01-06 DIAGNOSIS — N183 Chronic kidney disease, stage 3 unspecified: Secondary | ICD-10-CM | POA: Diagnosis not present

## 2019-01-10 ENCOUNTER — Encounter: Payer: Self-pay | Admitting: Internal Medicine

## 2019-01-13 ENCOUNTER — Telehealth: Payer: Self-pay | Admitting: *Deleted

## 2019-01-13 DIAGNOSIS — I480 Paroxysmal atrial fibrillation: Secondary | ICD-10-CM

## 2019-01-13 MED ORDER — APIXABAN 2.5 MG PO TABS: 2.5000 mg | ORAL_TABLET | Freq: Two times a day (BID) | ORAL | 11 refills | Status: DC

## 2019-01-13 NOTE — Telephone Encounter (Signed)
I would recommend starting Eliquis 2.5 mg twice daily as soon as possible. If we start today, we can try to perform overdrive pacing at the 75/19/8242. No guarantee it will be successful, but we may be able to avoid a TEE and cardioversion if it is.  If BP is OK (110/65 or higher), then increasing furosemide might help with symptoms. If BP is lower, I would avoid increasing the diuretic.

## 2019-01-13 NOTE — Telephone Encounter (Signed)
[facility] alert received 01/13/19 at 02:00 for long AT/AF episode, ongoing since 01/11/19 at 06:17. No documented history of AF, not on [place].  Spoke with patient and son. Pt reports she does feel weak, ongoing issue, maybe a little worse in the past few days. Otherwise at baseline. Pt has upcoming appointment with Dr. Sallyanne Kuster on 01/23/19. Confirmed date/time with son. He reports Dr. Mariea Clonts (PCP) is requesting to increase fursemide to 4 days/week, wanted approval from Dr. Sallyanne Kuster. Advised I will forward this message to Dr. Sallyanne Kuster in advance of her appointment. Pt and son in agreement with plan, no further questions at this time.

## 2019-01-13 NOTE — Telephone Encounter (Signed)
Spoke with patient and son. They are aware of recommendations to START Eliquis 2.5mg  BID. Advised I will confirm with Dr. Sallyanne Kuster whether ASA should be stopped. Reviewed signs/symptoms of bleeding with pt's son. He agrees to pickup prescription tomorrow morning from pharmacy. Also reviewed instructions for furosemide. Pt's son verbalizes understanding of all information. No further questions at this time.

## 2019-01-14 ENCOUNTER — Telehealth: Payer: Self-pay | Admitting: Cardiovascular Disease

## 2019-01-14 ENCOUNTER — Other Ambulatory Visit: Payer: Self-pay

## 2019-01-14 ENCOUNTER — Encounter: Payer: Self-pay | Admitting: Family

## 2019-01-14 ENCOUNTER — Ambulatory Visit (INDEPENDENT_AMBULATORY_CARE_PROVIDER_SITE_OTHER): Payer: Medicare Other | Admitting: Family

## 2019-01-14 VITALS — Ht 59.0 in | Wt 95.0 lb

## 2019-01-14 DIAGNOSIS — I6523 Occlusion and stenosis of bilateral carotid arteries: Secondary | ICD-10-CM | POA: Diagnosis not present

## 2019-01-14 DIAGNOSIS — J4520 Mild intermittent asthma, uncomplicated: Secondary | ICD-10-CM | POA: Diagnosis not present

## 2019-01-14 DIAGNOSIS — E785 Hyperlipidemia, unspecified: Secondary | ICD-10-CM | POA: Diagnosis not present

## 2019-01-14 DIAGNOSIS — I5033 Acute on chronic diastolic (congestive) heart failure: Secondary | ICD-10-CM | POA: Diagnosis not present

## 2019-01-14 DIAGNOSIS — Z950 Presence of cardiac pacemaker: Secondary | ICD-10-CM | POA: Diagnosis not present

## 2019-01-14 DIAGNOSIS — I5032 Chronic diastolic (congestive) heart failure: Secondary | ICD-10-CM | POA: Diagnosis not present

## 2019-01-14 DIAGNOSIS — I130 Hypertensive heart and chronic kidney disease with heart failure and stage 1 through stage 4 chronic kidney disease, or unspecified chronic kidney disease: Secondary | ICD-10-CM | POA: Diagnosis not present

## 2019-01-14 DIAGNOSIS — I495 Sick sinus syndrome: Secondary | ICD-10-CM | POA: Diagnosis not present

## 2019-01-14 DIAGNOSIS — I2510 Atherosclerotic heart disease of native coronary artery without angina pectoris: Secondary | ICD-10-CM | POA: Diagnosis not present

## 2019-01-14 DIAGNOSIS — I739 Peripheral vascular disease, unspecified: Secondary | ICD-10-CM | POA: Diagnosis not present

## 2019-01-14 DIAGNOSIS — Z953 Presence of xenogenic heart valve: Secondary | ICD-10-CM | POA: Diagnosis not present

## 2019-01-14 DIAGNOSIS — N183 Chronic kidney disease, stage 3 unspecified: Secondary | ICD-10-CM | POA: Diagnosis not present

## 2019-01-14 DIAGNOSIS — E0590 Thyrotoxicosis, unspecified without thyrotoxic crisis or storm: Secondary | ICD-10-CM

## 2019-01-14 DIAGNOSIS — Z951 Presence of aortocoronary bypass graft: Secondary | ICD-10-CM | POA: Diagnosis not present

## 2019-01-14 DIAGNOSIS — E440 Moderate protein-calorie malnutrition: Secondary | ICD-10-CM | POA: Diagnosis not present

## 2019-01-14 DIAGNOSIS — I4891 Unspecified atrial fibrillation: Secondary | ICD-10-CM

## 2019-01-14 DIAGNOSIS — Z905 Acquired absence of kidney: Secondary | ICD-10-CM | POA: Diagnosis not present

## 2019-01-14 DIAGNOSIS — G3184 Mild cognitive impairment, so stated: Secondary | ICD-10-CM | POA: Diagnosis not present

## 2019-01-14 DIAGNOSIS — I441 Atrioventricular block, second degree: Secondary | ICD-10-CM | POA: Diagnosis not present

## 2019-01-14 DIAGNOSIS — Z7982 Long term (current) use of aspirin: Secondary | ICD-10-CM | POA: Diagnosis not present

## 2019-01-14 DIAGNOSIS — Z8781 Personal history of (healed) traumatic fracture: Secondary | ICD-10-CM | POA: Diagnosis not present

## 2019-01-14 MED ORDER — METHIMAZOLE 5 MG PO TABS: 5.0000 mg | ORAL_TABLET | Freq: Every day | ORAL | 1 refills | Status: DC

## 2019-01-14 NOTE — Progress Notes (Signed)
This service is provided via telemedicine  No vital signs collected/recorded due to the encounter was a telemedicine visit.   Location of patient (ex: home, work):  Home  Patient consents to a telephone visit:  Yes  Location of the provider (ex: office, home):  [facility] office  Name of any referring provider:  Hollace Kinnier, DO  Names of all persons participating in the telemedicine service and their role in the encounter:  Bonney Leitz, [place]; Marlowe Sax, NP; patient, Mac[place], patient's son.  Time spent on call:  12.31 minutes CMA time only   Provider: Veasna Santibanez FNP-C  Gayland Curry, DO  Patient Care Team: Gayland Curry, DO as PCP - General (Geriatric Medicine)  Extended Emergency Contact Information Primary Emergency Contact: Mcnee,[name] Address: [place], [place] Phone: [phone] Mobile Phone: [phone] Relation: Son  Code Status:  Full Code  Goals of care: Advanced Directive information Advanced Directives 01/14/2019  Does Patient Have a Medical Advance Directive? No  Type of Advance Directive -  Does patient want to make changes to medical advance directive? -  Would patient like information on creating a medical advance directive? Yes (MAU/Ambulatory/Procedural Areas - Information given)     Chief Complaint  Patient presents with  . Acute Visit    Patient complains of difficulty breathing, tightness more across her stomach.     HPI:  Pt is a 85 y.o. female seen today  for an acute visit for evaluation of shortness of breath.Patient son states noticed increased shortness of breath today with some " chest heaving".He gave her breathing treatment with improvement.she also complains some tightness more across her stomach.Her weight today was 95 lbs.weight has been stable in the 90's though she weighed 85 lbs after hospital discharge in August,2020.son states her leg edema has worsen.She does not wear  any compression hose.He states was advised by Dr.Reed to administered furosemide 20 mg tablet four times per week but has not given extra dose since last Sunday. Son states patient was started on Eliquis 2.5 mg tablet twice daily by Cardiologist.He states was told patient's pacemaker indicated Afib.she has a follow up visit with Dr.Croitoru 01/23/2019.Patient was also advised to discontinue Asprin once she starts Eliquis.On chart review,plan to perform overdrive pacing on upcoming visit to avoid a TEE and cardioversion.Patient was also advised on increasing furosemide if B/p is 110/65 or higher but avoid increasing if B/p is lower. She denies any chest pain,tightness or palpitation.  She states completed working with Physical Therapy and [facility]  one week ago. She denies any contact with person sick with COVID-19.   Past Medical History:  Diagnosis Date  . Aneurysm (arteriovenous) of coronary vessels    ascending aorta requiring bypass of the LAD  . Carotid stenosis 04/28/08   Doppler: <40% stenosis bilateral  . Chronic kidney disease, stage 3   . Hyperlipemia   . Hypertension   . Hypertensive chronic kidney disease   . Hypothyroidism, unspecified 02/13/2018   Symptoms well controlled with current therapy  . Mild cognitive impairment, so stated 03/28/2018   Symptoms poorly controlled, needs frequent adjustments in treatment and dose monitoring  . Mild intermittent asthma    Symptoms controlled with difficulty, affecting daily functioning, needs ongoing monitoring  . Occlusion and stenosis of bilateral carotid arteries   . Pacemaker generator end of life 11/18/08   Intermittent high-grade atrioventricular block  . Peripheral arterial disease ([place])  left ABI of 0.78  . Presence of permanent cardiac pacemaker 08/26/01   Sinus node dysfunction-[place]  . Presence of xenogenic heart valve   . PVD (peripheral vascular disease) ([place])   . SSS (sick sinus syndrome) ([place])   . Status post ascending  aortic aneurysm repair/AVR -  [employer] Freestyle root 09/28/2014   ascending aorta requiring bypass of the LAD    Past Surgical History:  Procedure Laterality Date  . ABDOMINAL HYSTERECTOMY  1966  . APPENDECTOMY  1943  . ASCENDING AORTIC ANEURYSM REPAIR  09/22/97   porcine aortic root  . BREAST FIBROADENOMA SURGERY  11/89  . CARDIAC SURGERY  09/22/1997   aortic valve root repair with porcine at time of CABG  . CORONARY ARTERY BYPASS GRAFT  09/22/97   LIMA to the LAD  . I&D EXTREMITY Left 08/15/2017   Procedure: IRRIGATION AND DEBRIDEMENT EXTREMITY;  Surgeon: Nicholes Stairs, MD;  Location: [facility];  Service: Orthopedics;  Laterality: Left;  . [place]   left  . NEPHRECTOMY  1958   right  . ORIF HUMERUS FRACTURE Left 08/15/2017   Procedure: OPEN REDUCTION INTERNAL FIXATION (ORIF) PROXIMAL HUMERUS FRACTURE;  Surgeon: Nicholes Stairs, MD;  Location: [facility];  Service: Orthopedics;  Laterality: Left;  . OVARIAN CYST SURGERY  1948  . PACEMAKER GENERATOR CHANGE  11/18/08   [place]  . PACEMAKER INSERTION  08/26/01   [place]  . PPM GENERATOR CHANGEOUT N/A 05/16/2017   Procedure: PPM GENERATOR CHANGEOUT;  Surgeon: Sanda Klein, MD;  Location: [facility];  Service: Cardiovascular;  Laterality: N/A;    Allergies  Allergen Reactions  . Accupril [Quinapril Hcl] Other (See Comments)    Unknown reaction   . Amlodipine Swelling  . Benadryl [Diphenhydramine Hcl] Other (See Comments)    Unknown reaction   . Biaxin [Clarithromycin] Other (See Comments)    Unknown reaction   . Ciprofloxacin Other (See Comments)    Unknown reaction   . Codeine Other (See Comments)    Unknown reaction   . Diovan [Valsartan] Other (See Comments)    Unknown reaction   . Medrol [Methylprednisolone] Other (See Comments)    Unknown  . Morphine And Related Other (See Comments)    Unknown reaction   . Neurontin [Gabapentin] Other (See Comments)    Unknown reaction   . Penicillins      Unknown reaction  Has patient had a PCN reaction causing immediate rash, facial/tongue/throat swelling, SOB or lightheadedness with hypotension: Unknown Has patient had a PCN reaction causing severe rash involving mucus membranes or skin necrosis: Unknown Has patient had a PCN reaction that required hospitalization: Unknown Has patient had a PCN reaction occurring within the last 10 years: No If all of the above answers are "NO", then may proceed with Cephalosporin use.     Outpatient Encounter Medications as of 01/14/2019  Medication Sig  . acetaminophen (TYLENOL) 500 MG tablet Take 500 mg by mouth every 6 (six) hours as needed for mild pain or headache.  . albuterol (ACCUNEB) 0.63 MG/3ML nebulizer solution Take 1 ampule by nebulization every 6 (six) hours as needed for wheezing.  Marland Kitchen apixaban (ELIQUIS) 2.5 MG TABS tablet Take 1 tablet (2.5 mg total) by mouth 2 (two) times daily.  Marland Kitchen aspirin EC 81 MG tablet Take 81 mg by mouth daily.  . brimonidine (ALPHAGAN) 0.2 % ophthalmic solution Place 1 drop into both eyes 2 (two) times daily.   . carvedilol (COREG) 3.125 MG tablet Take  1 tablet (3.125 mg total) by mouth 2 (two) times daily with a meal.  . Cholecalciferol (VITAMIN D) 50 MCG (2000 UT) CAPS Take 1 capsule by mouth daily.  . Coenzyme Q10 (COQ10) 200 MG CAPS Take 200 mg by mouth daily.  . furosemide (LASIX) 20 MG tablet Take 1 tablet (20 mg total) by mouth daily. Take one tablet by mouth daily x 5 days then resume one by mouth on Mon/Wed/Fri.Also Take 20 mg tablet one by mouth daily as needed for shortness of breath,> 3 lbs weight gain from 98 lbs or worsening edema.  . methimazole (TAPAZOLE) 5 MG tablet Take 1 tablet (5 mg total) by mouth daily.  . vitamin B-12 (CYANOCOBALAMIN) 500 MCG tablet Take 500 mcg by mouth daily.   No facility-administered encounter medications on file as of 01/14/2019.     Review of Systems  Constitutional: Negative for appetite change, chills, fatigue and fever.   HENT: Negative for congestion, rhinorrhea, sinus pressure, sinus pain, sneezing and sore throat.   Respiratory: Positive for wheezing. Negative for cough and chest tightness.        Shortness of breath with exertion   Cardiovascular: Positive for leg swelling. Negative for chest pain and palpitations.  Gastrointestinal: Negative for abdominal distention, abdominal pain, constipation, diarrhea, nausea and vomiting.  Genitourinary: Negative for decreased urine volume, difficulty urinating, flank pain and frequency.  Neurological: Negative for dizziness, weakness, light-headedness, numbness and headaches.  Psychiatric/Behavioral: Negative for agitation, confusion and sleep disturbance. The patient is not nervous/anxious.     Immunization History  Administered Date(s) Administered  . Fluad Quad(high Dose 65+) 10/24/2018  . Influenza, High Dose Seasonal PF 11/16/2014, 11/09/2015  . Influenza-Unspecified 11/30/2010, 11/20/2011, 11/14/2012, 11/13/2017  . Pneumococcal Conjugate-13 03/28/2018  . Pneumococcal Polysaccharide-23 01/08/2006, 02/20/2012  . Tdap 07/23/2010, 08/14/2017   Pertinent  Health Maintenance Due  Topic Date Due  . DEXA SCAN  01/14/2020 (Originally 10/17/1989)  . INFLUENZA VACCINE  Completed  . PNA vac Low Risk Adult  Completed   Fall Risk  11/19/2018 10/11/2018 09/26/2018 06/27/2018 03/28/2018  Falls in the past year? 0 0 0 0 0  Number falls in past yr: 0 0 0 0 0  Injury with Fall? 0 0 0 0 0    Vitals:   01/14/19 0830  Weight: 95 lb (43.1 kg)  Height: 4\' 11"  (1.499 m)   Body mass index is 19.19 kg/m. Physical Exam  Unable to complete on telephone visit.   Labs reviewed: Recent Labs    10/11/18 2301  10/15/18 0504 10/24/18 0909 11/19/18 1018  NA  --    < > 138 141 140  K  --    < > 3.8 4.6 4.4  CL  --    < > 98 106 104  CO2  --    < > 30 31 29   GLUCOSE  --    < > 105* 106 117*  BUN  --    < > 36* 34* 35*  CREATININE 1.66*   < > 1.71* 1.70* 1.44*  CALCIUM   --    < > 9.0 8.9 9.3  MG 2.3  --  2.0  --   --   PHOS 4.2  --   --   --   --    < > = values in this interval not displayed.   Recent Labs    09/26/18 1225 10/11/18 1715 11/19/18 1018  AST 15 22 17   ALT 11 19 10   ALKPHOS  --  80  --   BILITOT 0.9 1.3* 1.2  PROT 6.6 6.9 6.6  ALBUMIN  --  3.8  --    Recent Labs    09/26/18 1225  10/14/18 0543 10/24/18 0909 11/19/18 1018  WBC 6.6   < > 5.2 4.4 5.9  NEUTROABS 4,653  --   --  2,328 2,985  HGB 10.4*   < > 9.7* 9.6* 10.2*  HCT 31.9*   < > 29.1* 29.5* 31.0*  MCV 100.9*   < > 99.7 101.0* 101.3*  PLT 210   < > 177 220 132*   < > = values in this interval not displayed.   Lab Results  Component Value Date   TSH 14.08 (H) 11/19/2018   No results found for: HGBA1C Lab Results  Component Value Date   CHOL 153 09/13/2017   HDL 49 09/13/2017   LDLCALC 92 09/13/2017   TRIG 62 09/13/2017    Significant Diagnostic Results in last 30 days:  No results found.  Assessment/Plan 1. Hyperthyroidism Request refill for methimazole.  - methimazole (TAPAZOLE) 5 MG tablet; Take 1 tablet (5 mg total) by mouth daily.  Dispense: 30 tablet; Refill: 1  2. Chronic diastolic heart failure ([place]) Has had increased leg swelling and shortness of breath.Advised by MD to increase furosemide but has not taken as directed.Per son vital signs stable. - encouraged to keep legs elevated when seated.Avoid adding extra salt to food. - Notify provider's office for abrupt weight gain. - Take furosemide 20 mg tablet one by mouth four times per week as directed hold if B/p < 110/65.continue albuterol every 6 hrs as needed.cont.coreg 3.125 mg tablet twice daily with meals.  3. New onset a-fib ([facility]) Eliquis 2.5 mg tablet twice daily ordered 01/13/2019 by Cardiologist Dr.Croitoru.Patient's  Son will pick up medication today.Follow up with cardiology   01/23/2019 as directed.   Family/ staff Communication: Reviewed plan of care with patient and  son.  Labs/tests ordered: None   Spent 11 minutes of non-face to face with patient   Sandrea Hughs, NP

## 2019-01-14 NOTE — Telephone Encounter (Signed)
Yes, thank you for picking that up. STOP aspirin.

## 2019-01-14 NOTE — Telephone Encounter (Signed)
Son called today and states that even with insurance, the patient would have to pay $500 for the eliquis.   The son would like to know how he can get enrolled in the 30 day free trial program . He will also send a MyChart Message to Dr. Loletha Grayer about it

## 2019-01-14 NOTE — Telephone Encounter (Signed)
Spoke to patient's son a 30 day free Eliquis card left at [employer] office front desk.

## 2019-01-14 NOTE — Telephone Encounter (Signed)
The patient's son has been made aware to stop the aspirin.  A 30 day card and samples have been left for the patient.  Medication Samples have been provided to the patient.  Drug name: Eliquis       Strength: 2.5 mg        Qty: 2 boxes  LOT: [id number]  Exp.Date: 01/2020

## 2019-01-14 NOTE — Telephone Encounter (Signed)
Message routed to Reed, Tiffany L, DO  

## 2019-01-20 ENCOUNTER — Telehealth: Payer: Self-pay | Admitting: Cardiovascular Disease

## 2019-01-20 NOTE — Telephone Encounter (Signed)
Returned the call to the son. He will need to be with his mother at the appointment.

## 2019-01-20 NOTE — Telephone Encounter (Signed)
Son of the patient wanted to know if he would be able to accompany the patient to her upcoming appointment to see Dr. Sallyanne Kuster. Please call to let the son know the office's answer

## 2019-01-22 ENCOUNTER — Other Ambulatory Visit: Payer: Self-pay

## 2019-01-22 ENCOUNTER — Emergency Department (HOSPITAL_COMMUNITY): Payer: Medicare Other

## 2019-01-22 ENCOUNTER — Telehealth: Payer: Self-pay

## 2019-01-22 ENCOUNTER — Encounter (HOSPITAL_COMMUNITY): Payer: Self-pay | Admitting: Emergency Medicine

## 2019-01-22 ENCOUNTER — Ambulatory Visit: Payer: Medicare Other | Admitting: Family

## 2019-01-22 ENCOUNTER — Inpatient Hospital Stay (HOSPITAL_COMMUNITY)
Admission: EM | Admit: 2019-01-22 | Discharge: 2019-01-24 | DRG: 291 | Disposition: A | Discharge status: Home Health | Payer: Medicare Other | Attending: Internal Medicine | Admitting: Internal Medicine

## 2019-01-22 DIAGNOSIS — R627 Adult failure to thrive: Secondary | ICD-10-CM | POA: Diagnosis present

## 2019-01-22 DIAGNOSIS — I4891 Unspecified atrial fibrillation: Secondary | ICD-10-CM | POA: Diagnosis not present

## 2019-01-22 DIAGNOSIS — J189 Pneumonia, unspecified organism: Secondary | ICD-10-CM | POA: Diagnosis present

## 2019-01-22 DIAGNOSIS — I5033 Acute on chronic diastolic (congestive) heart failure: Secondary | ICD-10-CM | POA: Diagnosis not present

## 2019-01-22 DIAGNOSIS — Z880 Allergy status to penicillin: Secondary | ICD-10-CM

## 2019-01-22 DIAGNOSIS — I340 Nonrheumatic mitral (valve) insufficiency: Secondary | ICD-10-CM | POA: Diagnosis not present

## 2019-01-22 DIAGNOSIS — E785 Hyperlipidemia, unspecified: Secondary | ICD-10-CM | POA: Diagnosis present

## 2019-01-22 DIAGNOSIS — Z8349 Family history of other endocrine, nutritional and metabolic diseases: Secondary | ICD-10-CM

## 2019-01-22 DIAGNOSIS — Z950 Presence of cardiac pacemaker: Secondary | ICD-10-CM

## 2019-01-22 DIAGNOSIS — D638 Anemia in other chronic diseases classified elsewhere: Secondary | ICD-10-CM | POA: Diagnosis present

## 2019-01-22 DIAGNOSIS — I361 Nonrheumatic tricuspid (valve) insufficiency: Secondary | ICD-10-CM | POA: Diagnosis not present

## 2019-01-22 DIAGNOSIS — Z20828 Contact with and (suspected) exposure to other viral communicable diseases: Secondary | ICD-10-CM | POA: Diagnosis present

## 2019-01-22 DIAGNOSIS — I495 Sick sinus syndrome: Secondary | ICD-10-CM | POA: Diagnosis present

## 2019-01-22 DIAGNOSIS — I480 Paroxysmal atrial fibrillation: Secondary | ICD-10-CM | POA: Diagnosis present

## 2019-01-22 DIAGNOSIS — J4520 Mild intermittent asthma, uncomplicated: Secondary | ICD-10-CM | POA: Diagnosis present

## 2019-01-22 DIAGNOSIS — I5032 Chronic diastolic (congestive) heart failure: Secondary | ICD-10-CM

## 2019-01-22 DIAGNOSIS — N183 Chronic kidney disease, stage 3 unspecified: Secondary | ICD-10-CM | POA: Diagnosis present

## 2019-01-22 DIAGNOSIS — I130 Hypertensive heart and chronic kidney disease with heart failure and stage 1 through stage 4 chronic kidney disease, or unspecified chronic kidney disease: Principal | ICD-10-CM | POA: Diagnosis present

## 2019-01-22 DIAGNOSIS — I4892 Unspecified atrial flutter: Secondary | ICD-10-CM | POA: Diagnosis present

## 2019-01-22 DIAGNOSIS — Z8249 Family history of ischemic heart disease and other diseases of the circulatory system: Secondary | ICD-10-CM

## 2019-01-22 DIAGNOSIS — D539 Nutritional anemia, unspecified: Secondary | ICD-10-CM | POA: Diagnosis present

## 2019-01-22 DIAGNOSIS — I441 Atrioventricular block, second degree: Secondary | ICD-10-CM | POA: Diagnosis present

## 2019-01-22 DIAGNOSIS — Z888 Allergy status to other drugs, medicaments and biological substances status: Secondary | ICD-10-CM

## 2019-01-22 DIAGNOSIS — Z79899 Other long term (current) drug therapy: Secondary | ICD-10-CM | POA: Diagnosis not present

## 2019-01-22 DIAGNOSIS — Z951 Presence of aortocoronary bypass graft: Secondary | ICD-10-CM

## 2019-01-22 DIAGNOSIS — E538 Deficiency of other specified B group vitamins: Secondary | ICD-10-CM | POA: Diagnosis present

## 2019-01-22 DIAGNOSIS — Z681 Body mass index (BMI) 19 or less, adult: Secondary | ICD-10-CM | POA: Diagnosis not present

## 2019-01-22 DIAGNOSIS — I4819 Other persistent atrial fibrillation: Secondary | ICD-10-CM | POA: Diagnosis present

## 2019-01-22 DIAGNOSIS — N1832 Chronic kidney disease, stage 3b: Secondary | ICD-10-CM | POA: Diagnosis not present

## 2019-01-22 DIAGNOSIS — Z953 Presence of xenogenic heart valve: Secondary | ICD-10-CM | POA: Diagnosis not present

## 2019-01-22 DIAGNOSIS — I2510 Atherosclerotic heart disease of native coronary artery without angina pectoris: Secondary | ICD-10-CM | POA: Diagnosis not present

## 2019-01-22 DIAGNOSIS — I10 Essential (primary) hypertension: Secondary | ICD-10-CM | POA: Diagnosis not present

## 2019-01-22 DIAGNOSIS — Z7901 Long term (current) use of anticoagulants: Secondary | ICD-10-CM | POA: Diagnosis not present

## 2019-01-22 DIAGNOSIS — Z881 Allergy status to other antibiotic agents status: Secondary | ICD-10-CM

## 2019-01-22 DIAGNOSIS — E0590 Thyrotoxicosis, unspecified without thyrotoxic crisis or storm: Secondary | ICD-10-CM | POA: Diagnosis present

## 2019-01-22 DIAGNOSIS — M40204 Unspecified kyphosis, thoracic region: Secondary | ICD-10-CM | POA: Diagnosis present

## 2019-01-22 DIAGNOSIS — I509 Heart failure, unspecified: Secondary | ICD-10-CM

## 2019-01-22 DIAGNOSIS — Z905 Acquired absence of kidney: Secondary | ICD-10-CM | POA: Diagnosis not present

## 2019-01-22 DIAGNOSIS — Z885 Allergy status to narcotic agent status: Secondary | ICD-10-CM

## 2019-01-22 DIAGNOSIS — I739 Peripheral vascular disease, unspecified: Secondary | ICD-10-CM | POA: Diagnosis present

## 2019-01-22 DIAGNOSIS — I483 Typical atrial flutter: Secondary | ICD-10-CM | POA: Diagnosis not present

## 2019-01-22 LAB — URINALYSIS, ROUTINE W REFLEX MICROSCOPIC
Bilirubin Urine: NEGATIVE
Glucose, UA: NEGATIVE mg/dL
Hgb urine dipstick: NEGATIVE
Ketones, ur: NEGATIVE mg/dL
Leukocytes,Ua: NEGATIVE
Nitrite: NEGATIVE
Protein, ur: NEGATIVE mg/dL
Specific Gravity, Urine: 1.006 (ref 1.005–1.030)
pH: 7 (ref 5.0–8.0)

## 2019-01-22 LAB — BASIC METABOLIC PANEL
Anion gap: 9 (ref 5–15)
BUN: 37 mg/dL — ABNORMAL HIGH (ref 8–23)
CO2: 29 mmol/L (ref 22–32)
Calcium: 9.3 mg/dL (ref 8.9–10.3)
Chloride: 104 mmol/L (ref 98–111)
Creatinine, Ser: 1.68 mg/dL — ABNORMAL HIGH (ref 0.44–1.00)
GFR calc Af Amer: 30 mL/min — ABNORMAL LOW (ref >60)
GFR calc non Af Amer: 26 mL/min — ABNORMAL LOW (ref >60)
Glucose, Bld: 200 mg/dL — ABNORMAL HIGH (ref 70–99)
Potassium: 5.1 mmol/L (ref 3.5–5.1)
Sodium: 142 mmol/L (ref 135–145)

## 2019-01-22 LAB — CBC
HCT: 37.9 % (ref 36.0–46.0)
Hemoglobin: 11.9 g/dL — ABNORMAL LOW (ref 12.0–15.0)
MCH: 33.4 pg (ref 26.0–34.0)
MCHC: 31.4 g/dL (ref 30.0–36.0)
MCV: 106.5 fL — ABNORMAL HIGH (ref 80.0–100.0)
Platelets: 183 10*3/uL (ref 150–400)
RBC: 3.56 MIL/uL — ABNORMAL LOW (ref 3.87–5.11)
RDW: 17.7 % — ABNORMAL HIGH (ref 11.5–15.5)
WBC: 5.6 10*3/uL (ref 4.0–10.5)
nRBC: 0 % (ref 0.0–0.2)

## 2019-01-22 LAB — MAGNESIUM: Magnesium: 2.3 mg/dL (ref 1.7–2.4)

## 2019-01-22 LAB — HEPATIC FUNCTION PANEL
ALT: 11 U/L (ref 0–44)
AST: 20 U/L (ref 15–41)
Albumin: 3.7 g/dL (ref 3.5–5.0)
Alkaline Phosphatase: 75 U/L (ref 38–126)
Bilirubin, Direct: 0.2 mg/dL (ref 0.0–0.2)
Indirect Bilirubin: 0.5 mg/dL (ref 0.3–0.9)
Total Bilirubin: 0.7 mg/dL (ref 0.3–1.2)
Total Protein: 6.6 g/dL (ref 6.5–8.1)

## 2019-01-22 LAB — BRAIN NATRIURETIC PEPTIDE: B Natriuretic Peptide: 1716.7 pg/mL — ABNORMAL HIGH (ref 0.0–100.0)

## 2019-01-22 LAB — PROCALCITONIN: Procalcitonin: <0.1 ng/mL

## 2019-01-22 LAB — LACTIC ACID, PLASMA
Lactic Acid, Venous: 1.3 mmol/L (ref 0.5–1.9)
Lactic Acid, Venous: 1.4 mmol/L (ref 0.5–1.9)

## 2019-01-22 LAB — TROPONIN I (HIGH SENSITIVITY)
Troponin I (High Sensitivity): 90 ng/L — ABNORMAL HIGH (ref <18)
Troponin I (High Sensitivity): 90 ng/L — ABNORMAL HIGH (ref <18)
Troponin I (High Sensitivity): 88 ng/L — ABNORMAL HIGH (ref <18)

## 2019-01-22 LAB — POC SARS CORONAVIRUS 2 AG -  ED: SARS Coronavirus 2 Ag: NEGATIVE

## 2019-01-22 LAB — PHOSPHORUS: Phosphorus: 4.2 mg/dL (ref 2.5–4.6)

## 2019-01-22 MED ORDER — SODIUM CHLORIDE 0.9% FLUSH
3.0000 mL | Freq: Once | INTRAVENOUS | Status: AC
  Administered 2019-01-22: 3 mL via INTRAVENOUS

## 2019-01-22 MED ORDER — ACETAMINOPHEN 650 MG RE SUPP: 650.0000 mg | Freq: Four times a day (QID) | RECTAL | Status: DC | PRN

## 2019-01-22 MED ORDER — FUROSEMIDE 10 MG/ML IJ SOLN
40.0000 mg | Freq: Two times a day (BID) | INTRAMUSCULAR | Status: DC
  Administered 2019-01-22 – 2019-01-24 (×4): 40 mg via INTRAVENOUS
  Filled 2019-01-22 (×4): qty 4

## 2019-01-22 MED ORDER — ONDANSETRON HCL 4 MG/2ML IJ SOLN: 4.0000 mg | Freq: Four times a day (QID) | INTRAMUSCULAR | Status: DC | PRN

## 2019-01-22 MED ORDER — METHIMAZOLE 5 MG PO TABS
5.0000 mg | ORAL_TABLET | Freq: Every day | ORAL | Status: DC
  Administered 2019-01-23 – 2019-01-24 (×2): 5 mg via ORAL
  Filled 2019-01-22 (×3): qty 1

## 2019-01-22 MED ORDER — APIXABAN 2.5 MG PO TABS
2.5000 mg | ORAL_TABLET | Freq: Two times a day (BID) | ORAL | Status: DC
  Administered 2019-01-22 – 2019-01-24 (×4): 2.5 mg via ORAL
  Filled 2019-01-22 (×5): qty 1

## 2019-01-22 MED ORDER — VITAMIN D 25 MCG (1000 UNIT) PO TABS
2000.0000 [IU] | ORAL_TABLET | Freq: Every day | ORAL | Status: DC
  Administered 2019-01-22 – 2019-01-24 (×3): 2000 [IU] via ORAL
  Filled 2019-01-22 (×3): qty 2

## 2019-01-22 MED ORDER — CARVEDILOL 3.125 MG PO TABS
3.1250 mg | ORAL_TABLET | Freq: Two times a day (BID) | ORAL | Status: DC
  Administered 2019-01-23 – 2019-01-24 (×3): 3.125 mg via ORAL
  Filled 2019-01-22 (×5): qty 1

## 2019-01-22 MED ORDER — ONDANSETRON HCL 4 MG PO TABS: 4.0000 mg | ORAL_TABLET | Freq: Four times a day (QID) | ORAL | Status: DC | PRN

## 2019-01-22 MED ORDER — VITAMIN B-12 1000 MCG PO TABS
500.0000 ug | ORAL_TABLET | Freq: Every day | ORAL | Status: DC
  Administered 2019-01-23 – 2019-01-24 (×2): 500 ug via ORAL
  Filled 2019-01-22 (×2): qty 1

## 2019-01-22 MED ORDER — FUROSEMIDE 10 MG/ML IJ SOLN
40.0000 mg | Freq: Once | INTRAMUSCULAR | Status: AC
  Administered 2019-01-22: 40 mg via INTRAVENOUS
  Filled 2019-01-22: qty 4

## 2019-01-22 MED ORDER — BRIMONIDINE TARTRATE 0.2 % OP SOLN
1.0000 [drp] | Freq: Two times a day (BID) | OPHTHALMIC | Status: DC
  Administered 2019-01-23 – 2019-01-24 (×2): 1 [drp] via OPHTHALMIC
  Filled 2019-01-22 (×3): qty 5

## 2019-01-22 MED ORDER — COQ10 200 MG PO CAPS: 200.0000 mg | ORAL_CAPSULE | Freq: Every day | ORAL | Status: DC

## 2019-01-22 MED ORDER — ACETAMINOPHEN 325 MG PO TABS: 650.0000 mg | ORAL_TABLET | Freq: Four times a day (QID) | ORAL | Status: DC | PRN

## 2019-01-22 MED ORDER — ALBUTEROL SULFATE (2.5 MG/3ML) 0.083% IN NEBU
2.5000 mg | INHALATION_SOLUTION | RESPIRATORY_TRACT | Status: DC | PRN
  Administered 2019-01-22: 2.5 mg via RESPIRATORY_TRACT
  Filled 2019-01-22: qty 3

## 2019-01-22 NOTE — ED Triage Notes (Signed)
C/o SOB x 1 year that is worse over the last 2 days.    Denies cough and fever.  Diagnosed with "irregular heart rate" by cardiologist last week and started on Eliquis.

## 2019-01-22 NOTE — ED Provider Notes (Signed)
  Face-to-face evaluation   History: She presents for evaluation of shortness of breath, worsening over the last 2 days.  No cough or fever.  She has been using nebulizers gradually more frequently over the last couple weeks.  There is been no fever, productive cough or known exposure to COVID-19.  Physical exam: Elderly, alert and cooperative.  She is conversant.  Lungs with decreased air movement bilaterally, no audible wheezes.  No increased work of breathing.  She is kyphotic.  Medical screening examination/treatment/procedure(s) were conducted as a shared visit with non-physician practitioner(s) and myself.  I personally evaluated the patient during the encounter    Daleen Bo, MD 01/25/19 1108

## 2019-01-22 NOTE — Telephone Encounter (Signed)
Patient's son called and stated patient's breathing due to asthma and allergies has gotten worse. He feels breathing has gotten worse due to allergies. Son requested an appointment for patient to be seen today by a provider.   Offered and scheduled appointment to be seen today by provider. Son requested a refill for patient's albuterol. Routing to provider patient will be seeing today for approval.

## 2019-01-22 NOTE — H&P (Signed)
History and Physical    MARIALENA WOLLEN ZOX:[id number] DOB: [birth year] DOA: 01/22/2019  PCP: Gayland Curry, DO  Patient coming from: Home I have personally briefly reviewed patient's old medical records in [place]  Chief Complaint: Worsening shortness of breath  HPI: Holly Bolton is a 85 y.o. female with medical history significant of hypertension, hyperlipidemia, hyperthyroidism, CKD stage IIIb, coronary artery disease status post CABG, second-degree AV block, sick sinus syndrome status post pacemaker, aortic aneurysm status post ascending aorta repair with bioprosthetic valve/Medtronic freestyle, presents to emergency department due to worsening shortness of breath.  Patient's son at bedside is the historian who reports that patient has worsening shortness of breath started 3 days ago has weight gain of 13 pounds and orthopnea and PND.  Has chronic leg swelling-denies association with worsening, pain, redness.  Son has been giving albuterol breathing treatment with little to no help.  Patient does not have history of COPD and she does not use oxygen at home.  No history of headache, blurry vision, chest pain, palpitation, lightheadedness, dizziness, nausea, vomiting, cough, congestion, fever, chills, diarrhea, decreased appetite or weight loss.  She lives with her son, no history of smoking, alcohol, illicit drug use.  She uses cane for ambulation.  She is compliant with her home medicines and denies increase salt intake.  Has appointment with cardiology tomorrow a.m. for pacemaker interrogation.  ED Course: Upon arrival to ED patient is afebrile, tachypneic, maintaining oxygen saturation on room air, initial troponin 90, BNP elevated at 1716, chest x-ray shows worsening right-sided pleural effusion.  COVID-19 and blood culture pending.  Received Lasix 40 mg IV once in the ED.  Review of Systems: As per HPI otherwise negative.    Past Medical History:  Diagnosis Date  . Aneurysm  (arteriovenous) of coronary vessels    ascending aorta requiring bypass of the LAD  . Carotid stenosis 04/28/08   Doppler: <40% stenosis bilateral  . Chronic kidney disease, stage 3   . Hyperlipemia   . Hypertension   . Hypertensive chronic kidney disease   . Hypothyroidism, unspecified 02/13/2018   Symptoms well controlled with current therapy  . Mild cognitive impairment, so stated 03/28/2018   Symptoms poorly controlled, needs frequent adjustments in treatment and dose monitoring  . Mild intermittent asthma    Symptoms controlled with difficulty, affecting daily functioning, needs ongoing monitoring  . Occlusion and stenosis of bilateral carotid arteries   . Pacemaker generator end of life 11/18/08   Intermittent high-grade atrioventricular block  . Peripheral arterial disease (HCC)    left ABI of 0.78  . Presence of permanent cardiac pacemaker 08/26/01   Sinus node dysfunction-[facility]  . Presence of xenogenic heart valve   . PVD (peripheral vascular disease) ([place])   . SSS (sick sinus syndrome) ([place])   . Status post ascending aortic aneurysm repair/AVR -  Medtronic Freestyle root 09/28/2014   ascending aorta requiring bypass of the LAD     Past Surgical History:  Procedure Laterality Date  . ABDOMINAL HYSTERECTOMY  1966  . APPENDECTOMY  1943  . ASCENDING AORTIC ANEURYSM REPAIR  09/22/97   porcine aortic root  . BREAST FIBROADENOMA SURGERY  11/89  . CARDIAC SURGERY  09/22/1997   aortic valve root repair with porcine at time of CABG  . CORONARY ARTERY BYPASS GRAFT  09/22/97   LIMA to the LAD  . I&D EXTREMITY Left 08/15/2017   Procedure: IRRIGATION AND DEBRIDEMENT EXTREMITY;  Surgeon: Nicholes Stairs, MD;  Location:  [facility];  Service: Orthopedics;  Laterality: Left;  . [place]   left  . NEPHRECTOMY  1958   right  . ORIF HUMERUS FRACTURE Left 08/15/2017   Procedure: OPEN REDUCTION INTERNAL FIXATION (ORIF) PROXIMAL HUMERUS FRACTURE;  Surgeon: Nicholes Stairs, MD;  Location: [facility];  Service: Orthopedics;  Laterality: Left;  . OVARIAN CYST SURGERY  1948  . PACEMAKER GENERATOR CHANGE  11/18/08   [facility]  . PACEMAKER INSERTION  08/26/01   [facility]  . PPM GENERATOR CHANGEOUT N/A 05/16/2017   Procedure: PPM GENERATOR CHANGEOUT;  Surgeon: Sanda Klein, MD;  Location: [facility];  Service: Cardiovascular;  Laterality: N/A;     reports that she has never smoked. She has never used smokeless tobacco. She reports that she does not drink alcohol or use drugs.  Allergies  Allergen Reactions  . Accupril [Quinapril Hcl] Other (See Comments)    Unknown reaction   . Amlodipine Swelling  . Benadryl [Diphenhydramine Hcl] Other (See Comments)    Unknown reaction   . Biaxin [Clarithromycin] Other (See Comments)    Unknown reaction   . Ciprofloxacin Other (See Comments)    Unknown reaction   . Codeine Other (See Comments)    Unknown reaction   . Diovan [Valsartan] Other (See Comments)    Unknown reaction   . Medrol [Methylprednisolone] Other (See Comments)    Unknown  . Morphine And Related Other (See Comments)    Unknown reaction   . Neurontin [Gabapentin] Other (See Comments)    Unknown reaction   . Penicillins     Unknown reaction  Has patient had a PCN reaction causing immediate rash, facial/tongue/throat swelling, SOB or lightheadedness with hypotension: Unknown Has patient had a PCN reaction causing severe rash involving mucus membranes or skin necrosis: Unknown Has patient had a PCN reaction that required hospitalization: Unknown Has patient had a PCN reaction occurring within the last 10 years: No If all of the above answers are "NO", then may proceed with Cephalosporin use.     Family History  Problem Relation Age of Onset  . Heart attack Mother   . Heart attack Father   . Heart attack Brother   . Hyperlipidemia Sister   . Hypertension Sister     Prior to Admission medications   Medication Sig Start Date End Date  Taking? Authorizing Provider  acetaminophen (TYLENOL) 500 MG tablet Take 500 mg by mouth every 6 (six) hours as needed for mild pain or headache.   Yes [provider]  albuterol (ACCUNEB) 0.63 MG/3ML nebulizer solution Take 1 ampule by nebulization every 6 (six) hours as needed for wheezing.   Yes [provider]  apixaban (ELIQUIS) 2.5 MG TABS tablet Take 1 tablet (2.5 mg total) by mouth 2 (two) times daily. 01/13/19  Yes Croitoru, Mihai, MD  brimonidine (ALPHAGAN) 0.2 % ophthalmic solution Place 1 drop into both eyes 2 (two) times daily.  03/31/16  Yes [provider]  carvedilol (COREG) 3.125 MG tablet Take 1 tablet (3.125 mg total) by mouth 2 (two) times daily with a meal. 07/02/18  Yes Reed, Tiffany L, DO  Cholecalciferol (VITAMIN D) 50 MCG (2000 UT) CAPS Take 1 capsule by mouth daily.   Yes [provider]  Coenzyme Q10 (COQ10) 200 MG CAPS Take 200 mg by mouth daily.   Yes [provider]  furosemide (LASIX) 20 MG tablet Take 1 tablet (20 mg total) by mouth daily. Take one tablet by mouth daily  x 5 days then resume one by mouth on Mon/Wed/Fri.Also Take 20 mg tablet one by mouth daily as needed for shortness of breath,> 3 lbs weight gain from 98 lbs or worsening edema. Patient taking differently: Take 20 mg by mouth 4 (four) times a week. Mon/Wed/Fri/Sun 12/05/18  Yes Ngetich, Dinah C, NP  magnesium oxide (MAG-OX) 400 MG tablet Take 400 mg by mouth daily.   Yes [provider]  methimazole (TAPAZOLE) 5 MG tablet Take 1 tablet (5 mg total) by mouth daily. 01/14/19  Yes Ngetich, Dinah C, NP  vitamin B-12 (CYANOCOBALAMIN) 500 MCG tablet Take 500 mcg by mouth daily.   Yes [provider]    Physical Exam: Vitals:   01/22/19 1335 01/22/19 1400 01/22/19 1430 01/22/19 1500  BP:  (!) 154/79 (!) 166/76 (!) 153/70  Pulse:  70 70 70  Resp:  (!) 31 (!) 27 (!) 29  Temp:      TempSrc:      SpO2:  96% 98% 95%  Weight: 43.1 kg     Height: 4'  11" (1.499 m)       Constitutional: NAD, calm, comfortable, on room air, cachectic very thin and lean.   Eyes: PERRL, lids and conjunctivae normal ENMT: Mucous membranes are moist. Posterior pharynx clear of any exudate or lesions.Normal dentition.  Neck: normal, supple, no masses, no thyromegaly Respiratory: clear to auscultation bilaterally, no wheezing, no crackles. Normal respiratory effort. No accessory muscle use.  Cardiovascular: Regular rate and rhythm, no murmurs / rubs / gallops.  Bilateral 2+ pitting edema positive. 2+ pedal pulses. No carotid bruits.  Abdomen: no tenderness, no masses palpated. No hepatosplenomegaly. Bowel sounds positive.  Musculoskeletal: no clubbing / cyanosis. No joint deformity upper and lower extremities. Good ROM, no contractures. Normal muscle tone.  Skin: no rashes, lesions, ulcers. No induration Neurologic: CN 2-12 grossly intact. Sensation intact, DTR normal. Strength 5/5 in all 4.  Psychiatric: Normal judgment and insight. Alert and oriented x 3. Normal mood.    Labs on Admission: I have personally reviewed following labs and imaging studies  CBC: Recent Labs  Lab 01/22/19 1145  WBC 5.6  HGB 11.9*  HCT 37.9  MCV 106.5*  PLT 102   Basic Metabolic Panel: Recent Labs  Lab 01/22/19 1145  NA 142  K 5.1  CL 104  CO2 29  GLUCOSE 200*  BUN 37*  CREATININE 1.68*  CALCIUM 9.3   GFR: Estimated Creatinine Clearance: 13.9 mL/min (A) (by C-G formula based on SCr of 1.68 mg/dL (H)). Liver Function Tests: Recent Labs  Lab 01/22/19 1335  AST 20  ALT 11  ALKPHOS 75  BILITOT 0.7  PROT 6.6  ALBUMIN 3.7   No results for input(s): LIPASE, AMYLASE in the last 168 hours. No results for input(s): AMMONIA in the last 168 hours. Coagulation Profile: No results for input(s): INR, PROTIME in the last 168 hours. Cardiac Enzymes: No results for input(s): CKTOTAL, CKMB, CKMBINDEX, TROPONINI in the last 168 hours. BNP (last 3 results) No results  for input(s): PROBNP in the last 8760 hours. HbA1C: No results for input(s): HGBA1C in the last 72 hours. CBG: No results for input(s): GLUCAP in the last 168 hours. Lipid Profile: No results for input(s): CHOL, HDL, LDLCALC, TRIG, CHOLHDL, LDLDIRECT in the last 72 hours. Thyroid Function Tests: No results for input(s): TSH, T4TOTAL, FREET4, T3FREE, THYROIDAB in the last 72 hours. Anemia Panel: No results for input(s): VITAMINB12, FOLATE, FERRITIN, TIBC, IRON, RETICCTPCT in the last 72 hours.  Urine analysis:    Component Value Date/Time   COLORURINE YELLOW 08/14/2017 0121   APPEARANCEUR CLEAR 08/14/2017 0121   LABSPEC 1.009 08/14/2017 0121   PHURINE 5.0 08/14/2017 0121   GLUCOSEU NEGATIVE 08/14/2017 0121   HGBUR NEGATIVE 08/14/2017 0121   BILIRUBINUR NEGATIVE 08/14/2017 0121   KETONESUR NEGATIVE 08/14/2017 0121   PROTEINUR NEGATIVE 08/14/2017 0121   NITRITE NEGATIVE 08/14/2017 0121   LEUKOCYTESUR NEGATIVE 08/14/2017 0121    Radiological Exams on Admission: Dg Chest 2 View  Result Date: 01/22/2019 CLINICAL DATA:  Shortness of breath. EXAM: CHEST - 2 VIEW COMPARISON:  11/19/2018 FINDINGS: The patient is rotated to the left on the AP radiograph, and there is exaggerated thoracic kyphosis with the patient's chin obscuring the left greater than right lung apices. Prior sternotomy is again noted. A pacemaker remains in place with leads terminating over the right atrium and right ventricle. The cardiac silhouette is mildly enlarged. A moderate left pleural effusion has slightly enlarged with increased parenchymal opacity in the left mid and lower lung compared to the prior study. A small right pleural effusion is also now present. No pneumothorax is identified although evaluation for a small pneumothorax is limited on the left. Surgical clips are noted in the right upper abdomen. There is aortic atherosclerosis. No acute osseous abnormality is identified. IMPRESSION: 1. Slight enlargement of a  moderate left pleural effusion with increased atelectasis or consolidation in the left mid and lower lung. 2. Small right pleural effusion. Electronically Signed   By: Logan Bores M.D.   On: 01/22/2019 12:12    EKG: Ventricular paced rhythm.  Assessment/Plan Principal Problem:   Acute on chronic congestive heart failure (HCC) Active Problems:   CAD s/p CABG (LIMA to LAD)   Hyperlipidemia   SSS (sick sinus syndrome) (HCC)   Pacemaker, dual chamber [employer] 2003/2010/2019   Essential hypertension   Hyperthyroidism   Chronic kidney disease, stage 3   A-fib (HCC)   Macrocytic anemia    Acute on chronic diastolic congestive heart failure: -Patient presented with worsening shortness of breath, orthopnea, PND and weight gain of 13 pounds.  Chest x-ray as above: BNP elevated at 1716. -Admit patient on the floor for close monitoring.  On telemetry. -Received Lasix 40 mg IV once in the ED. -Started on Lasix 40 mg IV twice daily, strict INO's and daily weight.  Monitor electrolytes.  Check magnesium and phosphorus level. -Keep magnesium more than 2 and potassium more than 4. -Reviewed echo from August 2020 which showed preserved ejection fraction of 55 to 60%. -Patient is afebrile, no leukocytosis, no respiratory symptoms-we will check lactic acid, procalcitonin level.  Blood culture and COVID-19 is pending-will not start on antibiotics at this time. -Talked & discussed with cardiology NP-Berge-He will see the patient.  Elevated troponin: Likely secondary to demand ischemia? -Patient denies any chest pain.  EKG: No acute findings. -Continue to monitor.  Will trend troponin.  Coronary artery disease status post CABG: Stable -Continue Coreg and co-Q10.  Aspirin recently discontinued by cardiologist-as patient was started on Eliquis.  Hypertension: Stable -Continue Coreg.  Hyperthyroidism: Continue methimazole  CKD stage IIIb: Stable -Continue to monitor, avoid nephrotoxic medication.   Sick sinus syndrome status post pacemaker in place: -On telemetry.    Atrial tachycardia/atrial flutter: Rate controlled. -Patient has pacemaker. Reviewed EKG.  Recently started on Eliquis by her cardiologist. -We will continue Eliquis 2.5 mg p.o. twice daily. -On cardiac telemetry.  Macrocytic anemia: -H & H stable. -Cont. B12  DVT prophylaxis: TED/SCD/Eliquis Code Status: Full code-confirmed with patient and her son Family Communication: Patient's son present at bedside.  Plan of care discussed with patient and her son in length and they verbalized understanding and agreed with it. Disposition Plan: TBD Consults called: Cardiology admission status: Inpatient   Mckinley Jewel MD [facility] Pager [phone]  If 7PM-7AM, please contact night-coverage [email or web address] Password [facility]  01/22/2019, 4:54 PM

## 2019-01-22 NOTE — ED Notes (Signed)
Sent a urine culture with the urine specimen 

## 2019-01-22 NOTE — ED Provider Notes (Signed)
[facility] EMERGENCY DEPARTMENT Provider Note   CSN: [id number] Arrival date & time: 01/22/19  1121     History   Chief Complaint Chief Complaint  Patient presents with  . Shortness of Breath    HPI Holly Bolton is a 85 y.o. female.     85 year old female brought in by son from home for shortness of breath.  Per son patient has history of CHF, was hospitalized previously with similar symptoms on September 1, discharged at 85 pounds, has had gradual weight gain to 93 to 95 pounds with the swelling in the legs.  Patient has an albuterol nebulizer at home, states treatment used to provide relief of her shortness of breath for a day, then half a day, barely provides relief for an hour at this point.  Patient has been taking her Lasix as prescribed, 20 mg 3 times a day, recently increased to 4 times a day without improvement in symptoms.  Denies fevers, chills, cough, chest pain.  Reports a pulling sensation in her right upper quadrant at night, denies any pain after eating otherwise.  No other complaints or concerns today.     Past Medical History:  Diagnosis Date  . Aneurysm (arteriovenous) of coronary vessels    ascending aorta requiring bypass of the LAD  . Carotid stenosis 04/28/08   Doppler: <40% stenosis bilateral  . Chronic kidney disease, stage 3   . Hyperlipemia   . Hypertension   . Hypertensive chronic kidney disease   . Hypothyroidism, unspecified 02/13/2018   Symptoms well controlled with current therapy  . Mild cognitive impairment, so stated 03/28/2018   Symptoms poorly controlled, needs frequent adjustments in treatment and dose monitoring  . Mild intermittent asthma    Symptoms controlled with difficulty, affecting daily functioning, needs ongoing monitoring  . Occlusion and stenosis of bilateral carotid arteries   . Pacemaker generator end of life 11/18/08   Intermittent high-grade atrioventricular block  . Peripheral arterial disease (HCC)      left ABI of 0.78  . Presence of permanent cardiac pacemaker 08/26/01   Sinus node dysfunction-St.Jude  . Presence of xenogenic heart valve   . PVD (peripheral vascular disease) (HCC)   . SSS (sick sinus syndrome) (HCC)   . Status post ascending aortic aneurysm repair/AVR -  Medtronic Freestyle root 09/28/2014   ascending aorta requiring bypass of the LAD     Patient Active Problem List   Diagnosis Date Noted  . Acute on chronic diastolic CHF (congestive heart failure) (HCC) 10/11/2018  . Chronic kidney disease, stage 4 (severe) (HCC) 09/26/2018  . Intermittent claudication (HCC) 06/27/2018  . Mild intermittent asthma without complication 06/27/2018  . Senile osteoporosis 06/27/2018  . Bilateral bunions 03/28/2018  . Hyperthyroidism 03/20/2018  . Weight loss, unintentional 02/11/2018  . H/O hyperthyroidism 02/11/2018  . Memory loss, short term 02/11/2018  . Frailty syndrome in geriatric patient 02/11/2018  . Urge incontinence of urine 02/11/2018  . S/p nephrectomy 02/11/2018  . B12 deficiency 02/11/2018  . Elevated troponin   . Asthma 08/15/2017  . Open left humeral fracture 08/15/2017  . Fracture 08/15/2017  . Pacemaker battery depletion 05/17/2017  . Leg cramps 06/12/2016  . Peripheral arterial disease (HCC) 12/08/2014  . Status post ascending aortic aneurysm repair/AVR -  Medtronic Freestyle root 09/28/2014  . CAD s/p CABG (LIMA to LAD) 07/08/2013  . History of aortic root repairand bioprosthetic AVR 07/08/2013  . Hyperlipidemia 07/08/2013  . SSS (sick sinus syndrome) (HCC) 07/08/2013  .  Second degree AV block 07/08/2013  . Pacemaker, dual chamber St. Jude 2003/2010/2019 07/08/2013  . Essential hypertension 07/08/2013    Past Surgical History:  Procedure Laterality Date  . ABDOMINAL HYSTERECTOMY  1966  . APPENDECTOMY  1943  . ASCENDING AORTIC ANEURYSM REPAIR  09/22/97   porcine aortic root  . BREAST FIBROADENOMA SURGERY  11/89  . CARDIAC SURGERY  09/22/1997    aortic valve root repair with porcine at time of CABG  . CORONARY ARTERY BYPASS GRAFT  09/22/97   LIMA to the LAD  . I&D EXTREMITY Left 08/15/2017   Procedure: IRRIGATION AND DEBRIDEMENT EXTREMITY;  Surgeon: Yolonda Kida, MD;  Location[facility] OR;  Service: Orthopedics;  Laterality: Left;  . KIDNEY STONE SURGERY  1959   left  . NEPHRECTOMY  1958   right  . ORIF HUMERUS FRACTURE Left 08/15/2017   Procedure: OPEN REDUCTION INTERNAL FIXATION (ORIF) PROXIMAL HUMERUS FRACTURE;  Surgeon: Yolonda Kida, MD;  Location: [facility] OR;  Service: Orthopedics;  Laterality: Left;  . OVARIAN CYST SURGERY  1948  . PACEMAKER GENERATOR CHANGE  11/18/08   St.Jude  . PACEMAKER INSERTION  08/26/01   St.Jude  . PPM GENERATOR CHANGEOUT N/A 05/16/2017   Procedure: PPM GENERATOR CHANGEOUT;  Surgeon: Thurmon Fair, MD;  Location: [facility] INVASIVE CV LAB;  Service: Cardiovascular;  Laterality: N/A;     OB History   No obstetric history on file.      Home Medications    Prior to Admission medications   Medication Sig Start Date End Date Taking? Authorizing Provider  acetaminophen (TYLENOL) 500 MG tablet Take 500 mg by mouth every 6 (six) hours as needed for mild pain or headache.   Yes [provider]  albuterol (ACCUNEB) 0.63 MG/3ML nebulizer solution Take 1 ampule by nebulization every 6 (six) hours as needed for wheezing.   Yes [provider]  apixaban (ELIQUIS) 2.5 MG TABS tablet Take 1 tablet (2.5 mg total) by mouth 2 (two) times daily. 01/13/19  Yes Croitoru, Mihai, MD  brimonidine (ALPHAGAN) 0.2 % ophthalmic solution Place 1 drop into both eyes 2 (two) times daily.  03/31/16  Yes [provider]  carvedilol (COREG) 3.125 MG tablet Take 1 tablet (3.125 mg total) by mouth 2 (two) times daily with a meal. 07/02/18  Yes Reed, Tiffany L, DO  Cholecalciferol (VITAMIN D) 50 MCG (2000 UT) CAPS Take 1 capsule by mouth daily.   Yes [provider]  Coenzyme Q10 (COQ10) 200 MG CAPS  Take 200 mg by mouth daily.   Yes [provider]  furosemide (LASIX) 20 MG tablet Take 1 tablet (20 mg total) by mouth daily. Take one tablet by mouth daily x 5 days then resume one by mouth on Mon/Wed/Fri.Also Take 20 mg tablet one by mouth daily as needed for shortness of breath,> 3 lbs weight gain from 98 lbs or worsening edema. Patient taking differently: Take 20 mg by mouth 4 (four) times a week. Mon/Wed/Fri/Sun 12/05/18  Yes Ngetich, Dinah C, NP  magnesium oxide (MAG-OX) 400 MG tablet Take 400 mg by mouth daily.   Yes [provider]  methimazole (TAPAZOLE) 5 MG tablet Take 1 tablet (5 mg total) by mouth daily. 01/14/19  Yes Ngetich, Dinah C, NP  vitamin B-12 (CYANOCOBALAMIN) 500 MCG tablet Take 500 mcg by mouth daily.   Yes [provider]    Family History Family History  Problem Relation Age of Onset  . Heart attack Mother   . Heart attack Father   .  Heart attack Brother   . Hyperlipidemia Sister   . Hypertension Sister     Social History Social History   Tobacco Use  . Smoking status: Never Smoker  . Smokeless tobacco: Never Used  Substance Use Topics  . Alcohol use: No  . Drug use: No     Allergies   Accupril [quinapril hcl], Amlodipine, Benadryl [diphenhydramine hcl], Biaxin [clarithromycin], Ciprofloxacin, Codeine, Diovan [valsartan], Medrol [methylprednisolone], Morphine and related, Neurontin [gabapentin], and Penicillins   Review of Systems Review of Systems  Constitutional: Negative for chills, diaphoresis and fever.  HENT: Negative for congestion.   Respiratory: Positive for shortness of breath and wheezing. Negative for cough.   Cardiovascular: Positive for leg swelling. Negative for chest pain.  Gastrointestinal: Positive for abdominal pain. Negative for constipation, diarrhea, nausea and vomiting.  Genitourinary: Negative for difficulty urinating.  Musculoskeletal: Negative for arthralgias and myalgias.  Skin: Negative for  rash and wound.  Allergic/Immunologic: Negative for immunocompromised state.  Neurological: Negative for weakness.  Hematological: Bruises/bleeds easily.  Psychiatric/Behavioral: Negative for confusion.  All other systems reviewed and are negative.    Physical Exam Updated Vital Signs BP (!) 153/70   Pulse 70   Temp 97.8 F (36.6 C) (Oral)   Resp (!) 29   Ht 4\' 11"  (1.499 m)   Wt 43.1 kg   SpO2 95%   BMI 19.19 kg/m   Physical Exam Vitals signs and nursing note reviewed.  Constitutional:      General: She is not in acute distress.    Appearance: She is well-developed. She is not diaphoretic.  HENT:     Head: Normocephalic and atraumatic.  Neck:     Vascular: JVD present.  Cardiovascular:     Rate and Rhythm: Normal rate and regular rhythm.  Pulmonary:     Effort: Tachypnea present.     Breath sounds: Examination of the right-lower field reveals decreased breath sounds and rhonchi. Examination of the left-lower field reveals decreased breath sounds and rhonchi. Decreased breath sounds and rhonchi present. No wheezing.  Musculoskeletal:     Right lower leg: Edema present.     Left lower leg: Edema present.  Skin:    General: Skin is warm and dry.     Findings: No erythema or rash.  Neurological:     Mental Status: She is alert and oriented to person, place, and time.  Psychiatric:        Behavior: Behavior normal.      ED Treatments / Results  Labs (all labs ordered are listed, but only abnormal results are displayed) Labs Reviewed  BASIC METABOLIC PANEL - Abnormal; Notable for the following components:      Result Value   Glucose, Bld 200 (*)    BUN 37 (*)    Creatinine, Ser 1.68 (*)    GFR calc non Af Amer 26 (*)    GFR calc Af Amer 30 (*)    All other components within normal limits  CBC - Abnormal; Notable for the following components:   RBC 3.56 (*)    Hemoglobin 11.9 (*)    MCV 106.5 (*)    RDW 17.7 (*)    All other components within normal limits   BRAIN NATRIURETIC PEPTIDE - Abnormal; Notable for the following components:   B Natriuretic Peptide 1,716.7 (*)    All other components within normal limits  TROPONIN I (HIGH SENSITIVITY) - Abnormal; Notable for the following components:   Troponin I (High Sensitivity) 90 (*)  All other components within normal limits  TROPONIN I (HIGH SENSITIVITY) - Abnormal; Notable for the following components:   Troponin I (High Sensitivity) 90 (*)    All other components within normal limits  CULTURE, BLOOD (ROUTINE X 2)  CULTURE, BLOOD (ROUTINE X 2)  HEPATIC FUNCTION PANEL  POC SARS CORONAVIRUS 2 AG -  ED    EKG EKG Interpretation  Date/Time:  Wednesday January 22 2019 11:36:45 EST Ventricular Rate:  70 PR Interval:    QRS Duration: 172 QT Interval:  496 QTC Calculation: 535 R Axis:   -88 Text Interpretation: Ventricular-paced rhythm Abnormal ECG since last tracing no significant change Confirmed by Mancel Bale [phone]) on 01/22/2019 2:00:03 PM   Radiology Dg Chest 2 View  Result Date: 01/22/2019 CLINICAL DATA:  Shortness of breath. EXAM: CHEST - 2 VIEW COMPARISON:  11/19/2018 FINDINGS: The patient is rotated to the left on the AP radiograph, and there is exaggerated thoracic kyphosis with the patient's chin obscuring the left greater than right lung apices. Prior sternotomy is again noted. A pacemaker remains in place with leads terminating over the right atrium and right ventricle. The cardiac silhouette is mildly enlarged. A moderate left pleural effusion has slightly enlarged with increased parenchymal opacity in the left mid and lower lung compared to the prior study. A small right pleural effusion is also now present. No pneumothorax is identified although evaluation for a small pneumothorax is limited on the left. Surgical clips are noted in the right upper abdomen. There is aortic atherosclerosis. No acute osseous abnormality is identified. IMPRESSION: 1. Slight enlargement of a  moderate left pleural effusion with increased atelectasis or consolidation in the left mid and lower lung. 2. Small right pleural effusion. Electronically Signed   By: Sebastian Ache M.D.   On: 01/22/2019 12:12    Procedures Procedures (including critical care time)  Medications Ordered in ED Medications  furosemide (LASIX) injection 40 mg (has no administration in time range)  sodium chloride flush (NS) 0.9 % injection 3 mL (3 mLs Intravenous Given 01/22/19 1344)     Initial Impression / Assessment and Plan / ED Course  I have reviewed the triage vital signs and the nursing notes.  Pertinent labs & imaging results that were available during my care of the patient were reviewed by me and considered in my medical decision making (see chart for details).  Clinical Course as of Jan 21 1614  Wed Jan 22, 2019  6151 85 year old female brought in by son from home for shortness of breath, progressively worsening over the past few days.  On exam patient is tachypneic, diminished breath sounds in the bases with coarse lung sounds.  Patient also has pitting edema to her lower extremities, mild JVD.  Patient is pleasant and laughs throughout exam, give short answers that allows her son to provide the majority of her history. Review of records, patient was recently placed on Eliquis after report of prolonged AT/A. fib episode with plan to see her cardiologist tomorrow for possible override pacing.  Attempted to call patient's cardiologist however he is not in the office today. Chest x-ray shows increasing left effusion with new right effusion, consider possible left infiltrate.  Increase in BNP from previous discharge/last on file of 901, currently 1716.  Patient has been taking her Lasix without improvement, will give a dose of Lasix while in the ER.  Also consider possible COPD component as patient had asthma 30 years ago, is now using her albuterol nebulizer. Case was  discussed with Dr. Effie Shy who has seen  the patient.  Plan is to admit to hospitalist for acute on chronic CHF, possible pneumonia.  Case discussed with hospitalist, considered antibiotic for possible developing left infiltrate however patient with many antibiotic allergies and unable to provide history so much as she was hospitalized about 6 years ago after taking Cipro.  Hospitalist will follow with labs and exam and determine appropriateness of antibiotics with patient.   [LM]  1615 BENNITA TROUTWINE was evaluated in Emergency Department on 01/22/2019 for the symptoms described in the history of present illness. She was evaluated in the context of the global COVID-19 pandemic, which necessitated consideration that the patient might be at risk for infection with the SARS-CoV-2 virus that causes COVID-19. Institutional protocols and algorithms that pertain to the evaluation of patients at risk for COVID-19 are in a state of rapid change based on information released by regulatory bodies including the CDC and federal and state organizations. These policies and algorithms were followed during the patient's care in the ED.     [LM]    Clinical Course User Index [LM] Jeannie Fend, PA-C      Final Clinical Impressions(s) / ED Diagnoses   Final diagnoses:  Acute on chronic congestive heart failure, unspecified heart failure type [facility])  Community acquired pneumonia of left lung, unspecified part of lung    ED Discharge Orders    None       Jeannie Fend, PA-C 01/22/19 1615    Mancel Bale, MD 01/25/19 1108

## 2019-01-23 ENCOUNTER — Encounter: Payer: Medicare Other | Admitting: Cardiovascular Disease

## 2019-01-23 DIAGNOSIS — I480 Paroxysmal atrial fibrillation: Secondary | ICD-10-CM

## 2019-01-23 DIAGNOSIS — N1832 Chronic kidney disease, stage 3b: Secondary | ICD-10-CM

## 2019-01-23 DIAGNOSIS — E785 Hyperlipidemia, unspecified: Secondary | ICD-10-CM

## 2019-01-23 DIAGNOSIS — I2510 Atherosclerotic heart disease of native coronary artery without angina pectoris: Secondary | ICD-10-CM

## 2019-01-23 DIAGNOSIS — I5033 Acute on chronic diastolic (congestive) heart failure: Secondary | ICD-10-CM

## 2019-01-23 DIAGNOSIS — D638 Anemia in other chronic diseases classified elsewhere: Secondary | ICD-10-CM

## 2019-01-23 DIAGNOSIS — I10 Essential (primary) hypertension: Secondary | ICD-10-CM

## 2019-01-23 LAB — COMPREHENSIVE METABOLIC PANEL
ALT: 14 U/L (ref 0–44)
AST: 18 U/L (ref 15–41)
Albumin: 3.4 g/dL — ABNORMAL LOW (ref 3.5–5.0)
Alkaline Phosphatase: 68 U/L (ref 38–126)
Anion gap: 12 (ref 5–15)
BUN: 37 mg/dL — ABNORMAL HIGH (ref 8–23)
CO2: 31 mmol/L (ref 22–32)
Calcium: 9.1 mg/dL (ref 8.9–10.3)
Chloride: 100 mmol/L (ref 98–111)
Creatinine, Ser: 1.69 mg/dL — ABNORMAL HIGH (ref 0.44–1.00)
GFR calc Af Amer: 30 mL/min — ABNORMAL LOW (ref >60)
GFR calc non Af Amer: 26 mL/min — ABNORMAL LOW (ref >60)
Glucose, Bld: 89 mg/dL (ref 70–99)
Potassium: 4 mmol/L (ref 3.5–5.1)
Sodium: 143 mmol/L (ref 135–145)
Total Bilirubin: 1.1 mg/dL (ref 0.3–1.2)
Total Protein: 5.8 g/dL — ABNORMAL LOW (ref 6.5–8.1)

## 2019-01-23 LAB — CBC
HCT: 34.8 % — ABNORMAL LOW (ref 36.0–46.0)
Hemoglobin: 11.3 g/dL — ABNORMAL LOW (ref 12.0–15.0)
MCH: 32.9 pg (ref 26.0–34.0)
MCHC: 32.5 g/dL (ref 30.0–36.0)
MCV: 101.5 fL — ABNORMAL HIGH (ref 80.0–100.0)
Platelets: 205 10*3/uL (ref 150–400)
RBC: 3.43 MIL/uL — ABNORMAL LOW (ref 3.87–5.11)
RDW: 17.5 % — ABNORMAL HIGH (ref 11.5–15.5)
WBC: 6.4 10*3/uL (ref 4.0–10.5)
nRBC: 0.3 % — ABNORMAL HIGH (ref 0.0–0.2)

## 2019-01-23 LAB — SARS CORONAVIRUS 2 (TAT 6-24 HRS): SARS Coronavirus 2: NEGATIVE

## 2019-01-23 NOTE — Consult Note (Signed)
Cardiology Consultation:   Patient ID: Holly Bolton; [id number]; [birth year]   Admit date: 01/22/2019 Date of Consult: 01/23/2019  Primary Care Provider: Gayland Curry, DO Primary Cardiologist: Dr. Sallyanne Kuster   Patient Profile:   Holly Bolton is a 85 y.o. female with a hx of PAD, CAD s/p CABG (LIMA-LAD), AVR with aneurysm repair (bioprosthetic valve/Medtronic freestyle root), PPM placement for sick sinus syndrome and second-degree AV block (Medtronic 2010), new onset AF on low dose AC with Eliquis, HTN, CKD stage III and HLD who is being seen today for the evaluation of acute on chronic CHF at the request of Dr. Erlinda Hong.  History of Present Illness:   Holly Bolton a 85 year old female with a history stated above who presented to [facility] on 01/22/2019 with worsening shortness of breath for the last several weeks per patient report. Relatively poor historian and no family at bedside for HPI assistance therefore most information is from chart review. Per chart, she has had increasing SOB with weight gain, orthopnea and PND symptoms. She lives at home with her son. Given her persistent symptoms, they proceeded to the ED for further evaluation.  On ED arrival, BNP noted to be elevated at 1716 with CXR consistent with CHF including worsening right-sided pleural effusion. She received IV Lasix 40 mg once in the ED with some improvement. High-sensitivity troponin noted to be elevated at 90 with repeat lab work at 90 and 88 not consistent with ACS and no c/o chest pain symptoms.  Creatinine was elevated at 1.68 with a more recent baseline of 1.6.  Covid testing was negative.  Hospitalist service admitted with cardiology consultation for acute on chronic diastolic CHF at which time she was started on IV Lasix 40 mg twice daily with close monitoring of daily weights and strict I&O's.  She was last seen by Dr. Sallyanne Kuster on 03/20/2018 for pacemaker follow-up.  Per chart review, she had a pacemaker generator change out  for 2019.  Approximately 3 months later, she had a mechanical fall complicated by complex left humerus fracture in which she underwent surgical repair with placement of a plate.  At the time of last office visit, pacemaker function was noted to be normal with a current generator with approximately 9 years of estimated longevity.  Sensing parameters were noted to be deteriorated since implantation however were still acceptable.  At that time, she had 35% atrial pacing and 61% ventricular pacing with a very long AV conduction at 65ms with infrequent episodes of brief paroxysmal atrial tachycardia lasting 10 to 20 seconds each time.  There was no true atrial fibrillation noted.  On 01/13/2019 [facility] alert received for a long AT/new atrial fibrillation episode which was noted to have been ongoing since 01/11/2019 with no documented history of atrial fibrillation and not on oral anticoagulation.  Dr. Sallyanne Kuster notified for which it was recommended that she start Eliquis 2.5 mg twice daily.  Plan was for upcoming appointment with Dr. Sallyanne Kuster on 01/23/2019 at which time he was going to possibly perform overdrive pacing in hopes to avoid TEE and cardioversion.  Plan was for stable BP at 110/65 and increasing Lasix to help with symptoms however BP was lower to avoid titration of diuretics at that time.  ASA was stopped at that time as well. Underlying rhythm on admission EKG difficult to determine, V paced. Will have Medtronic rep perform interrogation.   Past Medical History:  Diagnosis Date   Aneurysm (arteriovenous) of coronary vessels    ascending  aorta requiring bypass of the LAD   Carotid stenosis 04/28/08   Doppler: <40% stenosis bilateral   Chronic kidney disease, stage 3    Hyperlipemia    Hypertension    Hypertensive chronic kidney disease    Hypothyroidism, unspecified 02/13/2018   Symptoms well controlled with current therapy   Mild cognitive impairment, so stated 03/28/2018   Symptoms  poorly controlled, needs frequent adjustments in treatment and dose monitoring   Mild intermittent asthma    Symptoms controlled with difficulty, affecting daily functioning, needs ongoing monitoring   Occlusion and stenosis of bilateral carotid arteries    Pacemaker generator end of life 11/18/08   Intermittent high-grade atrioventricular block   Peripheral arterial disease (HCC)    left ABI of 0.78   Presence of permanent cardiac pacemaker 08/26/01   Sinus node dysfunction-[facility]   Presence of xenogenic heart valve    PVD (peripheral vascular disease) (HCC)    SSS (sick sinus syndrome) ([place])    Status post ascending aortic aneurysm repair/AVR -  Medtronic Freestyle root 09/28/2014   ascending aorta requiring bypass of the LAD     Past Surgical History:  Procedure Laterality Date   ABDOMINAL HYSTERECTOMY  1966   APPENDECTOMY  1943   ASCENDING AORTIC ANEURYSM REPAIR  09/22/97   porcine aortic root   BREAST FIBROADENOMA SURGERY  11/89   CARDIAC SURGERY  09/22/1997   aortic valve root repair with porcine at time of CABG   CORONARY ARTERY BYPASS GRAFT  09/22/97   LIMA to the LAD   I&D EXTREMITY Left 08/15/2017   Procedure: IRRIGATION AND DEBRIDEMENT EXTREMITY;  Surgeon: Nicholes Stairs, MD;  Location: [facility];  Service: Orthopedics;  Laterality: Left;   [place]   left   NEPHRECTOMY  1958   right   ORIF HUMERUS FRACTURE Left 08/15/2017   Procedure: OPEN REDUCTION INTERNAL FIXATION (ORIF) PROXIMAL HUMERUS FRACTURE;  Surgeon: Nicholes Stairs, MD;  Location: [facility];  Service: Orthopedics;  Laterality: Left;   OVARIAN CYST SURGERY  1948   PACEMAKER GENERATOR CHANGE  11/18/08   [facility]   PACEMAKER INSERTION  08/26/01   [facility]   PPM GENERATOR CHANGEOUT N/A 05/16/2017   Procedure: PPM GENERATOR CHANGEOUT;  Surgeon: Sanda Klein, MD;  Location: [facility];  Service: Cardiovascular;  Laterality: N/A;     Prior to Admission medications    Medication Sig Start Date End Date Taking? Authorizing Provider  acetaminophen (TYLENOL) 500 MG tablet Take 500 mg by mouth every 6 (six) hours as needed for mild pain or headache.   Yes [provider]  albuterol (ACCUNEB) 0.63 MG/3ML nebulizer solution Take 1 ampule by nebulization every 6 (six) hours as needed for wheezing.   Yes [provider]  apixaban (ELIQUIS) 2.5 MG TABS tablet Take 1 tablet (2.5 mg total) by mouth 2 (two) times daily. 01/13/19  Yes Croitoru, Mihai, MD  brimonidine (ALPHAGAN) 0.2 % ophthalmic solution Place 1 drop into both eyes 2 (two) times daily.  03/31/16  Yes [provider]  carvedilol (COREG) 3.125 MG tablet Take 1 tablet (3.125 mg total) by mouth 2 (two) times daily with a meal. 07/02/18  Yes Reed, Tiffany L, DO  Cholecalciferol (VITAMIN D) 50 MCG (2000 UT) CAPS Take 1 capsule by mouth daily.   Yes [provider]  Coenzyme Q10 (COQ10) 200 MG CAPS Take 200 mg by mouth daily.   Yes [provider]  furosemide (LASIX) 20 MG tablet Take 1  tablet (20 mg total) by mouth daily. Take one tablet by mouth daily x 5 days then resume one by mouth on Mon/Wed/Fri.Also Take 20 mg tablet one by mouth daily as needed for shortness of breath,> 3 lbs weight gain from 98 lbs or worsening edema. Patient taking differently: Take 20 mg by mouth 4 (four) times a week. Mon/Wed/Fri/Sun 12/05/18  Yes Ngetich, Dinah C, NP  magnesium oxide (MAG-OX) 400 MG tablet Take 400 mg by mouth daily.   Yes [provider]  methimazole (TAPAZOLE) 5 MG tablet Take 1 tablet (5 mg total) by mouth daily. 01/14/19  Yes Ngetich, Dinah C, NP  vitamin B-12 (CYANOCOBALAMIN) 500 MCG tablet Take 500 mcg by mouth daily.   Yes [provider]    Inpatient Medications: Scheduled Meds:  apixaban  2.5 mg Oral BID   brimonidine  1 drop Both Eyes BID   carvedilol  3.125 mg Oral BID WC   cholecalciferol  2,000 Units Oral Daily   furosemide  40 mg  Intravenous Q12H   methimazole  5 mg Oral Daily   vitamin B-12  500 mcg Oral Daily   Continuous Infusions:  PRN Meds: acetaminophen **OR** acetaminophen, albuterol, ondansetron **OR** ondansetron (ZOFRAN) IV  Allergies:    Allergies  Allergen Reactions   Accupril [Quinapril Hcl] Other (See Comments)    Unknown reaction    Amlodipine Swelling   Benadryl [Diphenhydramine Hcl] Other (See Comments)    Unknown reaction    Biaxin [Clarithromycin] Other (See Comments)    Unknown reaction    Ciprofloxacin Other (See Comments)    Unknown reaction    Codeine Other (See Comments)    Unknown reaction    Diovan [Valsartan] Other (See Comments)    Unknown reaction    Medrol [Methylprednisolone] Other (See Comments)    Unknown   Morphine And Related Other (See Comments)    Unknown reaction    Neurontin [Gabapentin] Other (See Comments)    Unknown reaction    Penicillins     Unknown reaction  Has patient had a PCN reaction causing immediate rash, facial/tongue/throat swelling, SOB or lightheadedness with hypotension: Unknown Has patient had a PCN reaction causing severe rash involving mucus membranes or skin necrosis: Unknown Has patient had a PCN reaction that required hospitalization: Unknown Has patient had a PCN reaction occurring within the last 10 years: No If all of the above answers are "NO", then may proceed with Cephalosporin use.     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  Tobacco Use   Smoking status: Never Smoker   Smokeless tobacco: Never Used  Substance and Sexual Activity   Alcohol use: No   Drug use: No   Sexual activity: Not Currently  Other Topics Concern   Not on file  Social History  Narrative   Lives with son, Algis [place]   Caffeine use: 1 cup coffee per day   Right handed   Exercises at home with theraband, stationary bike and walking   Social Determinants of Health   Financial Resource Strain:    Difficulty of Paying Living Expenses: Not on file  Food Insecurity:    Worried About [place] in the Last Year: Not on  file   [place] in the Last Year: Not on file  Transportation Needs:    Lack of Transportation (Medical): Not on file   Lack of Transportation (Non-Medical): Not on file  Physical Activity:    Days of Exercise per Week: Not on file   Minutes of Exercise per Session: Not on file  Stress:    Feeling of Stress : Not on file  Social Connections:    Frequency of Communication with Friends and Family: Not on file   Frequency of Social Gatherings with Friends and Family: Not on file   Attends Religious Services: Not on file   Active Member of Clubs or Organizations: Not on file   Attends Archivist Meetings: Not on file   Marital Status: Not on file  Intimate Partner Violence: Not At Risk   Fear of Current or Ex-Partner: No   Emotionally Abused: No   Physically Abused: No   Sexually Abused: No    Family History:   Family History  Problem Relation Age of Onset   Heart attack Mother    Heart attack Father    Heart attack Brother    Hyperlipidemia Sister    Hypertension Sister    Family Status:  Family Status  Relation Name Status   Mother  Deceased   Father  Deceased   Sister  Alive   Brother  Deceased   Brother  (Not Specified)   Sister  (Not Specified)   Sister  (Not Specified)    ROS:  Please see the history of present illness.  All other ROS reviewed and negative.     Physical Exam/Data:   Vitals:   01/23/19 0125 01/23/19 0534 01/23/19 0545 01/23/19 0848  BP: 135/62  (!) 169/68 (!) 145/59  Pulse: 69  70 70  Resp: 17  19 19   Temp: (!) 97.4 F (36.3 C)  97.6 F (36.4  C) (!) 96.6 F (35.9 C)  TempSrc: Oral  Oral Axillary  SpO2: 94%  97% 92%  Weight:  39.9 kg    Height:        Intake/Output Summary (Last 24 hours) at 01/23/2019 1219 Last data filed at 01/23/2019 0859 Gross per 24 hour  Intake 120 ml  Output 400 ml  Net -280 ml   Filed Weights   01/22/19 1335 01/22/19 2103 01/23/19 0534  Weight: 43.1 kg 41.6 kg 39.9 kg   Body mass index is 15.57 kg/m.   General: Frail, elderly, NAD Neck: Negative for carotid bruits. No JVD Lungs: Diminished on left upper and lower lobes. No wheezes, rales, or rhonchi. Breathing is unlabored however becomes labored with movement  Cardiovascular: RRR with S1 S2. + murmurs Abdomen: Soft, non-tender, non-distended. No obvious abdominal masses. Extremities: No edema. DP pulses 2+ bilaterally Neuro: Alert and oriented. Has some degree of dementia. No focal deficits. No facial asymmetry. MAE spontaneously. Psych: Responds to questions appropriately with normal affect.    EKG:  The EKG was personally reviewed and demonstrates:  01/22/2019 V paced with HR 70bpm. Will have device interrogated Telemetry:  Telemetry was personally reviewed and demonstrates:  01/23/2019 V paced   Relevant CV Studies:  Echo 05/16/17: Study Conclusions - Left ventricle: The cavity size was normal. Wall thickness was increased in a pattern of moderate LVH. Systolic function was normal. The estimated ejection fraction was in the range of 50% to 55%. The study was not technically sufficient to allow evaluation of LV diastolic dysfunction due to atrial fibrillation. -  Aortic valve: Moderately calcified annulus. Trileaflet; moderately thickened leaflets. Valve area (VTI): 2.53 cm^2. Valve area (Vmax): 2.53 cm^2. Valve area (Vmean): 2.61 cm^2. - Mitral valve: Mildly calcified annulus. Mildly thickened leaflets . There was mild to moderate regurgitation. - Left atrium: The atrium was severely dilated. - Right ventricle:  The cavity size was mildly dilated. - Right atrium: The atrium was moderately dilated. - Tricuspid valve: There was moderate regurgitation. - Pulmonary arteries: Systolic pressure was moderately increased. PA peak pressure: 58 mm Hg (S). - Technically difficult study.  Echo 10/13/2018: 1. The left ventricle has normal systolic function, with an ejection fraction of 55-60%. The cavity size was normal. There is mild concentric left ventricular hypertrophy. Left ventricular diastolic Doppler parameters are consistent with pseudonormalization. Elevated mean left atrial pressure There is abnormal septal motion consistent with post-operative status. No evidence of left ventricular regional wall motion abnormalities. 2. The right ventricle has mildly reduced systolic function. The cavity was mildly enlarged. There is no increase in right ventricular wall thickness. Right ventricular systolic pressure is mildly elevated with an estimated pressure of 46.9 mmHg. 3. Left atrial size was moderately dilated. 4. Right atrial size was moderately dilated. 5. Tricuspid valve regurgitation is moderate. 6. A a Medtronic Freestyle root valve is present in the aortic position. Echo findings are consistent with thickened leaflets with restricted motion of the aortic prosthesis. 7. The aorta is abnormal unless otherwise noted. 8. The inferior vena cava was dilated in size with <50% respiratory variability. 9. When compared to the prior study: July 2019, despite marked degenerative changes of the aortic prosthesis, there is only very mild aortic stenosis, gradients are only slightly higher compared to 2019. There is evidence of increased right and left atrial pressure on the current study, but the estimated systolic PA pressure is not changed.  Laboratory Data:  Chemistry Recent Labs  Lab 01/22/19 1145 01/23/19 0251  NA 142 143  K 5.1 4.0  CL 104 100  CO2 29 31  GLUCOSE 200* 89  BUN 37* 37*    CREATININE 1.68* 1.69*  CALCIUM 9.3 9.1  GFRNONAA 26* 26*  GFRAA 30* 30*  ANIONGAP 9 12    Total Protein  Date Value Ref Range Status  01/23/2019 5.8 (L) 6.5 - 8.1 g/dL Final   Albumin  Date Value Ref Range Status  01/23/2019 3.4 (L) 3.5 - 5.0 g/dL Final   AST  Date Value Ref Range Status  01/23/2019 18 15 - 41 U/L Final   ALT  Date Value Ref Range Status  01/23/2019 14 0 - 44 U/L Final   Alkaline Phosphatase  Date Value Ref Range Status  01/23/2019 68 38 - 126 U/L Final   Total Bilirubin  Date Value Ref Range Status  01/23/2019 1.1 0.3 - 1.2 mg/dL Final   Hematology Recent Labs  Lab 01/22/19 1145 01/23/19 0251  WBC 5.6 6.4  RBC 3.56* 3.43*  HGB 11.9* 11.3*  HCT 37.9 34.8*  MCV 106.5* 101.5*  MCH 33.4 32.9  MCHC 31.4 32.5  RDW 17.7* 17.5*  PLT 183 205   Cardiac EnzymesNo results for input(s): TROPONINI in the last 168 hours. No results for input(s): TROPIPOC in the last 168 hours.  BNP Recent Labs  Lab 01/22/19 1145  BNP 1,716.7*    DDimer No results for input(s): DDIMER in the last 168 hours. TSH:  Lab Results  Component Value Date   TSH 14.08 (H) 11/19/2018   Lipids: Lab Results  Component Value Date  CHOL 153 09/13/2017   HDL 49 09/13/2017   LDLCALC 92 09/13/2017   TRIG 62 09/13/2017   HgbA1c:No results found for: HGBA1C  Radiology/Studies:  DG Chest 2 View  Result Date: 01/22/2019 CLINICAL DATA:  Shortness of breath. EXAM: CHEST - 2 VIEW COMPARISON:  11/19/2018 FINDINGS: The patient is rotated to the left on the AP radiograph, and there is exaggerated thoracic kyphosis with the patient's chin obscuring the left greater than right lung apices. Prior sternotomy is again noted. A pacemaker remains in place with leads terminating over the right atrium and right ventricle. The cardiac silhouette is mildly enlarged. A moderate left pleural effusion has slightly enlarged with increased parenchymal opacity in the left mid and lower lung compared  to the prior study. A small right pleural effusion is also now present. No pneumothorax is identified although evaluation for a small pneumothorax is limited on the left. Surgical clips are noted in the right upper abdomen. There is aortic atherosclerosis. No acute osseous abnormality is identified. IMPRESSION: 1. Slight enlargement of a moderate left pleural effusion with increased atelectasis or consolidation in the left mid and lower lung. 2. Small right pleural effusion. Electronically Signed   By: Logan Bores M.D.   On: 01/22/2019 12:12   Assessment and Plan:   1.  Acute on chronic CHF: -Patient presented with a several week history of worsening shortness of breath, orthopnea, weight gain and PND consistent with acute on chronic CHF -BNP on presentation 1716 with CXR consistent with CHF including increased left pleural effusion with increased atelectasis and small right pleural effusion. -Patient was given 40 mg IV Lasix once in the ED with symptom improvement and therefore started on IV Lasix 40 mg twice daily -I&O, net -26ml since hospital admission -Weight, 87.9lb today with an admission weight of 95lb -On 01/13/2019 [facility] alert received for a long AT/new atrial fibrillation episode which was noted to have been ongoing since 01/11/2019 with no documented history of atrial fibrillation and not on oral anticoagulation.  Dr. Sallyanne Kuster notified for which it was recommended that she start Eliquis 2.5 mg twice daily.  Plan was for close follow-up on 01/23/2019 at which time Dr. Sallyanne Kuster was to possibly perform overdrive pacing in hopes to avoid TEE and cardioversion. ASA was stopped at that time as well. -Underlying rhythm on admission EKG difficult to determine, V paced. -New atrial fibrillation likely exacerbating heart failure symptoms -We will have device interrogated while inpatient to evaluate AF burden -Monitor daily weights and strict I&O -Plan for repeat echocardiogram. Last echocardiogram  10/13/2018>>when compared to the prior study 08/2017, despite marked degenerative changes of the aortic prosthesis, there is only very mild aortic stenosis, gradients are only slightly higher compared to 2019.   2.  CAD s/p CABG with AVR and ascending aortic aneurysm/root repair: -Single-vessel LIMA to LAD bypass in 1999 with aortic root replacement with Medtronic freestyle root -lan for repeat echocardiogram.  Last echocardiogram 10/13/2018>>when compared to the prior study 08/2017, despite marked degenerative changes of the aortic prosthesis, there is only very mild aortic stenosis, gradients are only slightly higher compared to 2019.  -Denies anginal symptoms -Patient not interested in invasive testing -Continue carvedilol 3.125 mg, no ASA in the setting of Eliquis -Not interested in statin therapy  3. S/p dual-chamber PPM (Medtronic) for sinus node dysfunction and intermittent high-grade A-V block: -Underwent pacemaker generator change out in 2010 -Follows with Dr. Sallyanne Kuster -Most recently found to have new onset atrial fibrillation 01/13/2019 in which she was  started on low-dose Eliquis with plans for device setting changes at outpatient appointment today, 01/23/2019 versus proceeding with TEE/DCCV to reestablish NSR -Likely acute heart failure symptoms are in the setting of AF  4.  Hypertension: -Stable, 141/57, 145/59, 169/68 -Continue carvedilol 3.125 mg, Lasix 40 mg  5.  HLD: -Prefers not to take statin medications   For questions or updates, please contact [place] Please consult [email or web address] for contact info under Cardiology/STEMI.   SignedLaverna, Dossett NP-C [facility] Pager: [phone] 01/23/2019 12:19 PM

## 2019-01-23 NOTE — Progress Notes (Signed)
PROGRESS NOTE  Holly Bolton DXA:[id number] DOB: October 27, 1922 DOA: 01/22/2019 PCP: Gayland Curry, DO  Brief history: Holly Bolton is a 85 y.o. female with medical history significant of hypertension, hyperlipidemia, hyperthyroidism, CKD stage IIIb, coronary artery disease status post CABG, second-degree AV block, sick sinus syndrome status post pacemaker, aortic aneurysm status post ascending aorta repair with bioprosthetic valve/[employer] freestyle, presents to emergency department due to worsening shortness of breath.  She was recently diagnosed with afib was started on eliquis two weeks ago.   HPI/Recap of past 24 hours:  She is sitting up in chair, son at the bedside She denies of chest pain, no cough, no fever Patient cannot provide detailed history, will stop the history obtained from the son who is the main caregiver  Assessment/Plan: Principal Problem:   Acute on chronic congestive heart failure (HCC) Active Problems:   CAD s/p CABG (LIMA to LAD)   Hyperlipidemia   SSS (sick sinus syndrome) (HCC)   Pacemaker, dual chamber [employer]. Jude 2003/2010/2019   Essential hypertension   Hyperthyroidism   Chronic kidney disease, stage 3   A-fib (HCC)   Macrocytic anemia  Acute on chronic diastolic CHF exacerbation -Report progressive worsening short of breath in the last 2 weeks, and worsened despite of increased Lasix use at home -Son also report recently diagnosed with A. Fib 2 weeks ago -Chest x-ray on presentation showed bilateral pleural effusion left worse than the right -High-sensitivity troponin x3, 90 -90-88, BNP 1716 -She is here responding to Lasix IV 40 mg twice daily , weight on presentation was 95 pounds, today's 87.9 pounds, he is currently on room air at rest, he denies of chest pain, legs remain swollen -Cardiology consulted  Bilateral pleural effusions, left worse than the right -No sign of infection, no fever, no leukocytosis, lactic acid within normal limit ,  blood culture negative to date ,procalcitonin less than 0.1 -SARS-CoV-2 screening negative -Currently patient has no hypoxia, will continue to try Lasix, will hold off thoracentesis, this was discussed with son who is in agreement  Paroxysmal A. fib , recently diagnosed 2 weeks ago  H/o sick sinus syndrome status post pacemaker in place: -Ventricular paced rhythm in the hospital -She is on Coreg and a apixaban at home which are resumed -Cardiology consulted  Coronary artery disease status post CABG,AVR and ascending aortic aneurysm/root repair : she denies chest pain -Continue Coreg and co-Q10.  Aspirin recently discontinued by cardiologist-as patient was started on Eliquis. -she is not interested in statin therapy per cardiology note  CKD stage IIIb/anemia of chronic disease: Stable -Continue to monitor, avoid nephrotoxic medication.  Hypertension: Stable -Continue Coreg.  Mild epigastric tenderness, reports chronic for months, no recent changes LFT unremarkable Denies of history of  Gastritis Monitor  Hyperthyroidism: Continue methimazole   FTT; lives with son, will start PT once medically stable   DVT Prophylaxis: On Eliquis  Code Status: Full  Family Communication: patient , son at bedside  Disposition Plan: Not ready to discharge, the main volume overloaded, needs cardiology evaluation and clearance   Consultants:  Cardiology  Procedures:  None  Antibiotics:  None   Objective: BP (!) 145/59 (BP Location: Right Arm)   Pulse 70   Temp (!) 96.6 F (35.9 C) (Axillary)   Resp 19   Ht 5\' 3"  (1.6 m)   Wt 39.9 kg Comment: scale a  SpO2 92%   BMI 15.57 kg/m   Intake/Output Summary (Last 24 hours) at 01/23/2019 1138 Last data filed  at 01/23/2019 0859 Gross per 24 hour  Intake 120 ml  Output 400 ml  Net -280 ml   Filed Weights   01/22/19 1335 01/22/19 2103 01/23/19 0534  Weight: 43.1 kg 41.6 kg 39.9 kg    Exam: Patient is examined daily  including today on 01/23/2019, exams remain the same as of yesterday except that has changed    General: Frail elderly sitting up in chair, NAD  Cardiovascular: Paced rhythm  Respiratory: Diminished at bases, no rhonchi ,no wheezing  Abdomen: Mild epigastric tenderness , no guarding, no rebound soft/ND, positive BS  Musculoskeletal: No Edema  Neuro: alert, oriented   Data Reviewed: Basic Metabolic Panel: Recent Labs  Lab 01/22/19 1145 01/22/19 1732 01/23/19 0251  NA 142  --  143  K 5.1  --  4.0  CL 104  --  100  CO2 29  --  31  GLUCOSE 200*  --  89  BUN 37*  --  37*  CREATININE 1.68*  --  1.69*  CALCIUM 9.3  --  9.1  MG  --  2.3  --   PHOS  --  4.2  --    Liver Function Tests: Recent Labs  Lab 01/22/19 1335 01/23/19 0251  AST 20 18  ALT 11 14  ALKPHOS 75 68  BILITOT 0.7 1.1  PROT 6.6 5.8*  ALBUMIN 3.7 3.4*   No results for input(s): LIPASE, AMYLASE in the last 168 hours. No results for input(s): AMMONIA in the last 168 hours. CBC: Recent Labs  Lab 01/22/19 1145 01/23/19 0251  WBC 5.6 6.4  HGB 11.9* 11.3*  HCT 37.9 34.8*  MCV 106.5* 101.5*  PLT 183 205   Cardiac Enzymes:   No results for input(s): CKTOTAL, CKMB, CKMBINDEX, TROPONINI in the last 168 hours. BNP (last 3 results) Recent Labs    10/11/18 1500 10/14/18 0543 01/22/19 1145  BNP 2,821.5* 901.8* 1,716.7*    ProBNP (last 3 results) No results for input(s): PROBNP in the last 8760 hours.  CBG: No results for input(s): GLUCAP in the last 168 hours.  Recent Results (from the past 240 hour(s))  Blood culture (routine x 2)     Status: None (Preliminary result)   Collection Time: 01/22/19  4:27 PM   Specimen: BLOOD  Result Value Ref Range Status   Specimen Description BLOOD LEFT ANTECUBITAL  Final   Special Requests   Final    BOTTLES DRAWN AEROBIC AND ANAEROBIC Blood Culture adequate volume   Culture   Final    NO GROWTH < 24 HOURS Performed at [facility], [address] [place], [place] [address]    Report Status PENDING  Incomplete  Blood culture (routine x 2)     Status: None (Preliminary result)   Collection Time: 01/22/19  5:31 PM   Specimen: BLOOD  Result Value Ref Range Status   Specimen Description BLOOD BLOOD RIGHT FOREARM  Final   Special Requests   Final    BOTTLES DRAWN AEROBIC ONLY Blood Culture results may not be optimal due to an inadequate volume of blood received in culture bottles   Culture   Final    NO GROWTH < 24 HOURS Performed at [facility], [place] [address] [place], [place] [address]    Report Status PENDING  Incomplete  SARS CORONAVIRUS 2 (TAT 6-24 HRS) Nasopharyngeal Nasopharyngeal Swab     Status: None   Collection Time: 01/22/19  8:07 PM   Specimen: Nasopharyngeal Swab  Result  Value Ref Range Status   SARS Coronavirus 2 NEGATIVE NEGATIVE Final    Comment: (NOTE) SARS-CoV-2 target nucleic acids are NOT DETECTED. The SARS-CoV-2 RNA is generally detectable in upper and lower respiratory specimens during the acute phase of infection. Negative results do not preclude SARS-CoV-2 infection, do not rule out co-infections with other pathogens, and should not be used as the sole basis for treatment or other patient management decisions. Negative results must be combined with clinical observations, patient history, and epidemiological information. The expected result is Negative. Fact Sheet for Patients: SugarRoll.be Fact Sheet for Healthcare Providers: https://www.woods-mathews.com/ This test is not yet approved or cleared by the [place] FDA and  has been authorized for detection and/or diagnosis of SARS-CoV-2 by FDA under an Emergency Use Authorization (EUA). This EUA will remain  in effect (meaning this test can be used) for the duration of the COVID-19 declaration under Section 56 4(b)(1) of the Act, 21 U.S.C. section 360bbb-3(b)(1), unless the authorization is terminated  or revoked sooner. Performed at [facility], [place] [address] [place], [place] [address]      Studies: DG Chest 2 View  Result Date: 01/22/2019 CLINICAL DATA:  Shortness of breath. EXAM: CHEST - 2 VIEW COMPARISON:  11/19/2018 FINDINGS: The patient is rotated to the left on the AP radiograph, and there is exaggerated thoracic kyphosis with the patient's chin obscuring the left greater than right lung apices. Prior sternotomy is again noted. A pacemaker remains in place with leads terminating over the right atrium and right ventricle. The cardiac silhouette is mildly enlarged. A moderate left pleural effusion has slightly enlarged with increased parenchymal opacity in the left mid and lower lung compared to the prior study. A small right pleural effusion is also now present. No pneumothorax is identified although evaluation for a small pneumothorax is limited on the left. Surgical clips are noted in the right upper abdomen. There is aortic atherosclerosis. No acute osseous abnormality is identified. IMPRESSION: 1. Slight enlargement of a moderate left pleural effusion with increased atelectasis or consolidation in the left mid and lower lung. 2. Small right pleural effusion. Electronically Signed   By: Logan Bores M.D.   On: 01/22/2019 12:12    Scheduled Meds: . apixaban  2.5 mg Oral BID  . brimonidine  1 drop Both Eyes BID  . carvedilol  3.125 mg Oral BID WC  . cholecalciferol  2,000 Units Oral Daily  . furosemide  40 mg Intravenous Q12H  . methimazole  5 mg Oral Daily  . vitamin B-12  500 mcg Oral Daily    Continuous Infusions:   Time spent: 13mins I have personally reviewed and interpreted on  01/23/2019 daily labs, tele strips, imagings as discussed above under date review session and assessment and plans.  I reviewed all nursing notes, pharmacy notes, consultant notes,  vitals, pertinent old records  I have discussed plan of care as described above with RN , patient and  family on 01/23/2019   Florencia Reasons MD, PhD, FACP  [facility] Pager [phone]. If 7PM-7AM, please contact night-coverage at [email or web address], password [facility] 01/23/2019, 11:38 AM  LOS: 1 day

## 2019-01-24 ENCOUNTER — Inpatient Hospital Stay (HOSPITAL_COMMUNITY): Payer: Medicare Other

## 2019-01-24 DIAGNOSIS — Z905 Acquired absence of kidney: Secondary | ICD-10-CM

## 2019-01-24 DIAGNOSIS — N183 Chronic kidney disease, stage 3 unspecified: Secondary | ICD-10-CM

## 2019-01-24 DIAGNOSIS — I340 Nonrheumatic mitral (valve) insufficiency: Secondary | ICD-10-CM

## 2019-01-24 DIAGNOSIS — I483 Typical atrial flutter: Secondary | ICD-10-CM

## 2019-01-24 DIAGNOSIS — I361 Nonrheumatic tricuspid (valve) insufficiency: Secondary | ICD-10-CM

## 2019-01-24 LAB — BASIC METABOLIC PANEL
Anion gap: 10 (ref 5–15)
BUN: 37 mg/dL — ABNORMAL HIGH (ref 8–23)
CO2: 34 mmol/L — ABNORMAL HIGH (ref 22–32)
Calcium: 9 mg/dL (ref 8.9–10.3)
Chloride: 99 mmol/L (ref 98–111)
Creatinine, Ser: 1.73 mg/dL — ABNORMAL HIGH (ref 0.44–1.00)
GFR calc Af Amer: 29 mL/min — ABNORMAL LOW (ref >60)
GFR calc non Af Amer: 25 mL/min — ABNORMAL LOW (ref >60)
Glucose, Bld: 103 mg/dL — ABNORMAL HIGH (ref 70–99)
Potassium: 3.5 mmol/L (ref 3.5–5.1)
Sodium: 143 mmol/L (ref 135–145)

## 2019-01-24 LAB — ECHOCARDIOGRAM COMPLETE
Height: 63 in
Weight: 1347.45 oz

## 2019-01-24 LAB — MAGNESIUM: Magnesium: 2 mg/dL (ref 1.7–2.4)

## 2019-01-24 LAB — TSH: TSH: 7.923 u[IU]/mL — ABNORMAL HIGH (ref 0.350–4.500)

## 2019-01-24 MED ORDER — FUROSEMIDE 40 MG PO TABS: 40.0000 mg | ORAL_TABLET | Freq: Every day | ORAL | 0 refills | Status: DC

## 2019-01-24 NOTE — Care Management Important Message (Signed)
Important Message  Patient Details  Name: Holly Bolton MRN: [id number] Date of Birth: 12-16-1922   Medicare Important Message Given:  Yes     Nefertari Rebman [address] 01/24/2019, 1:55 PM

## 2019-01-24 NOTE — Progress Notes (Signed)
Thanks for the follow-up Tiffany.  Go ahead and schedule her for the cardioversion.  We can try to overdrive pace her before we shock her.

## 2019-01-24 NOTE — Evaluation (Signed)
Physical Therapy Evaluation Patient Details Name: Holly Bolton MRN: [id number] DOB: 03/18/1922 Today's Date: 01/24/2019   History of Present Illness  Pt adm with acute on chronic heart failure and aflutter. PMH - htn, pacer, ckd, cabg  Clinical Impression  Pt admitted with above diagnosis and presents to PT with functional limitations due to deficits listed below (See PT problem list). Pt needs skilled PT to maximize independence and safety to allow discharge to home with son.     Follow Up Recommendations Home health PT;Supervision/Assistance - 24 hour    Equipment Recommendations  None recommended by PT    Recommendations for Other Services       Precautions / Restrictions Precautions Precautions: Fall      Mobility  Bed Mobility Overal bed mobility: Needs Assistance Bed Mobility: Supine to Sit     Supine to sit: Min assist;HOB elevated     General bed mobility comments: assist to elevate trunk into sitting  Transfers Overall transfer level: Needs assistance Equipment used: Rolling walker (2 wheeled) Transfers: Sit to/from Stand Sit to Stand: Min assist         General transfer comment: assist to bring hips up and for balance  Ambulation/Gait Ambulation/Gait assistance: Min assist Gait Distance (Feet): 60 Feet Assistive device: Rolling walker (2 wheeled) Gait Pattern/deviations: Step-through pattern;Decreased step length - right;Decreased step length - left;Shuffle;Trunk flexed Gait velocity: decr Gait velocity interpretation: <1.31 ft/sec, indicative of household ambulator General Gait Details: assist for balance  Stairs            Wheelchair Mobility    Modified Rankin (Stroke Patients Only)       Balance Overall balance assessment: Needs assistance Sitting-balance support: No upper extremity supported;Feet supported Sitting balance-Leahy Scale: Fair     Standing balance support: Bilateral upper extremity supported Standing  balance-Leahy Scale: Poor Standing balance comment: walker and min guard for static standing                             Pertinent Vitals/Pain Pain Assessment: No/denies pain    Home Living Family/patient expects to be discharged to:: Private residence Living Arrangements: Children Available Help at Discharge: Family;Available PRN/intermittently Type of Home: House Home Access: Stairs to enter Entrance Stairs-Rails: None Entrance Stairs-Number of Steps: 2 Home Layout: One level Home Equipment: Cane - single point;Walker - 2 wheels;Shower seat      Prior Function Level of Independence: Needs assistance   Gait / Transfers Assistance Needed: modified independent with cane or walker           Hand Dominance   Dominant Hand: Right    Extremity/Trunk Assessment   Upper Extremity Assessment Upper Extremity Assessment: Generalized weakness    Lower Extremity Assessment Lower Extremity Assessment: Generalized weakness       Communication   Communication: HOH  Cognition Arousal/Alertness: Awake/alert Behavior During Therapy: WFL for tasks assessed/performed Overall Cognitive Status: Within Functional Limits for tasks assessed                                        General Comments      Exercises     Assessment/Plan    PT Assessment All further PT needs can be met in the next venue of care  PT Problem List Decreased strength;Decreased activity tolerance;Decreased balance;Decreased mobility       PT Treatment  Interventions      PT Goals (Current goals can be found in the Care Plan section)  Acute Rehab PT Goals PT Goal Formulation: All assessment and education complete, DC therapy    Frequency     Barriers to discharge        Co-evaluation               AM-PAC PT "6 Clicks" Mobility  Outcome Measure Help needed turning from your back to your side while in a flat bed without using bedrails?: A Little Help needed  moving from lying on your back to sitting on the side of a flat bed without using bedrails?: A Little Help needed moving to and from a bed to a chair (including a wheelchair)?: A Little Help needed standing up from a chair using your arms (e.g., wheelchair or bedside chair)?: A Little Help needed to walk in hospital room?: A Little Help needed climbing 3-5 steps with a railing? : A Little 6 Click Score: 18    End of Session   Activity Tolerance: Patient tolerated treatment well Patient left: in chair;with call bell/phone within reach;with family/visitor present Nurse Communication: Mobility status PT Visit Diagnosis: Unsteadiness on feet (R26.81);Muscle weakness (generalized) (M62.81);Other abnormalities of gait and mobility (R26.89)    Time: 1249-1315 PT Time Calculation (min) (ACUTE ONLY): 26 min   Charges:   PT Evaluation $PT Eval Low Complexity: [place] Pager [phone] Office [place] 01/24/2019, 2:10 PM

## 2019-01-24 NOTE — Progress Notes (Signed)
  Echocardiogram 2D Echocardiogram has been performed.  Jennette Dubin 01/24/2019, 12:17 PM

## 2019-01-24 NOTE — Discharge Instructions (Addendum)
You have been scheduled for an outpatient cardioversion on 02/10/2019 at 9:30am with Dr. Oval Linsey. It is very important that you do not miss any doses of your eliquis (apixaban) prior to this procedure.   - Please do not eat after midnight the night before your procedure except sips of water to take your meds. Be sure to take your eliquis (apixaban) on the morning of the procedure.  - Arrive to main entrance of [facility] [place] tower admitting, 1.5 hours prior to scheduled procedure time - you will need to arrive at 8:00am on 02/10/2019. - You will need a responsible adult to drive you home afterwards    Information on my medicine - ELIQUIS (apixaban)  Why was Eliquis prescribed for you? Eliquis was prescribed for you to reduce the risk of a blood clot forming that can cause a stroke if you have a medical condition called atrial fibrillation (a type of irregular heartbeat).  What do You need to know about Eliquis ? Take your Eliquis TWICE DAILY - one tablet in the morning and one tablet in the evening with or without food. If you have difficulty swallowing the tablet whole please discuss with your pharmacist how to take the medication safely.  Take Eliquis exactly as prescribed by your doctor and DO NOT stop taking Eliquis without talking to the doctor who prescribed the medication.  Stopping may increase your risk of developing a stroke.  Refill your prescription before you run out.  After discharge, you should have regular check-up appointments with your healthcare provider that is prescribing your Eliquis.  In the future your dose may need to be changed if your kidney function or weight changes by a significant amount or as you get older.  What do you do if you miss a dose? If you miss a dose, take it as soon as you remember on the same day and resume taking twice daily.  Do not take more than one dose of ELIQUIS at the same time to make up a missed dose.  Important Safety  Information A possible side effect of Eliquis is bleeding. You should call your healthcare provider right away if you experience any of the following: ? Bleeding from an injury or your nose that does not stop. ? Unusual colored urine (red or dark brown) or unusual colored stools (red or black). ? Unusual bruising for unknown reasons. ? A serious fall or if you hit your head (even if there is no bleeding).  Some medicines may interact with Eliquis and might increase your risk of bleeding or clotting while on Eliquis. To help avoid this, consult your healthcare provider or pharmacist prior to using any new prescription or non-prescription medications, including herbals, vitamins, non-steroidal anti-inflammatory drugs (NSAIDs) and supplements.  This website has more information on Eliquis (apixaban): http://www.eliquis.com/eliquis/home

## 2019-01-24 NOTE — Progress Notes (Signed)
Progress Note  Patient Name: Holly Bolton Date of Encounter: 01/24/2019  Primary Cardiologist: No primary care provider on file.   Subjective   Feeling well.  No chest pain or palpitations.  Has not ambulated.  Inpatient Medications    Scheduled Meds: . apixaban  2.5 mg Oral BID  . brimonidine  1 drop Both Eyes BID  . carvedilol  3.125 mg Oral BID WC  . cholecalciferol  2,000 Units Oral Daily  . methimazole  5 mg Oral Daily  . vitamin B-12  500 mcg Oral Daily   Continuous Infusions:  PRN Meds: acetaminophen **OR** acetaminophen, albuterol, ondansetron **OR** ondansetron (ZOFRAN) IV   Vital Signs    Vitals:   01/24/19 0024 01/24/19 0535 01/24/19 0600 01/24/19 0838  BP: 110/61 (!) 148/67  (!) 133/53  Pulse: 70 70  70  Resp: 18 19  20   Temp:    (!) 96.7 F (35.9 C)  TempSrc:    Axillary  SpO2: 95% 98%  98%  Weight:   26.3 kg   Height:        Intake/Output Summary (Last 24 hours) at 01/24/2019 0850 Last data filed at 01/24/2019 0500 Gross per 24 hour  Intake 720 ml  Output 2175 ml  Net -1455 ml   Last 3 Weights 01/24/2019 01/23/2019 01/22/2019  Weight (lbs) 58 lb 87 lb 14.4 oz 91 lb 12.8 oz  Weight (kg) 26.309 kg 39.871 kg 41.64 kg      Telemetry    Atrial flutter.  Ventricular pacing. - Personally Reviewed  ECG    Atrial flutter.  Ventricular pacing 70 bpm.- Personally Reviewed  Physical Exam   VS:  BP (!) 133/53 (BP Location: Left Arm)   Pulse 70   Temp (!) 96.7 F (35.9 C) (Axillary)   Resp 20   Ht 5\' 3"  (1.6 m)   Wt 38.2 kg   SpO2 98%   BMI 14.92 kg/m  , BMI Body mass index is 14.92 kg/m. GENERAL:  Well appearing.  No acute distress HEENT: Pupils equal round and reactive, fundi not visualized, oral mucosa unremarkable NECK:  No jugular venous distention, waveform within normal limits, carotid upstroke brisk and symmetric, no bruits LUNGS:  Clear to auscultation bilaterally HEART:  RRR.  PMI not displaced or sustained,S1 and S2 within  normal limits, no S3, no S4, no clicks, no rubs, no murmurs ABD:  Flat, positive bowel sounds normal in frequency in pitch, no bruits, no rebound, no guarding, no midline pulsatile mass, no hepatomegaly, no splenomegaly EXT:  2 plus pulses throughout, no edema, no cyanosis no clubbing SKIN:  No rashes no nodules NEURO:  Cranial nerves II through XII grossly intact, motor grossly intact throughout PSYCH:  Cognitively intact, oriented to person place and time   Labs    High Sensitivity Troponin:   Recent Labs  Lab 01/22/19 1145 01/22/19 1335 01/22/19 1732  TROPONINIHS 90* 90* 88*      Chemistry Recent Labs  Lab 01/22/19 1145 01/22/19 1335 01/23/19 0251 01/24/19 0326  NA 142  --  143 143  K 5.1  --  4.0 3.5  CL 104  --  100 99  CO2 29  --  31 34*  GLUCOSE 200*  --  89 103*  BUN 37*  --  37* 37*  CREATININE 1.68*  --  1.69* 1.73*  CALCIUM 9.3  --  9.1 9.0  PROT  --  6.6 5.8*  --   ALBUMIN  --  3.7 3.4*  --  AST  --  20 18  --   ALT  --  11 14  --   ALKPHOS  --  75 68  --   BILITOT  --  0.7 1.1  --   GFRNONAA 26*  --  26* 25*  GFRAA 30*  --  30* 29*  ANIONGAP 9  --  12 10     Hematology Recent Labs  Lab 01/22/19 1145 01/23/19 0251  WBC 5.6 6.4  RBC 3.56* 3.43*  HGB 11.9* 11.3*  HCT 37.9 34.8*  MCV 106.5* 101.5*  MCH 33.4 32.9  MCHC 31.4 32.5  RDW 17.7* 17.5*  PLT 183 205    BNP Recent Labs  Lab 01/22/19 1145  BNP 1,716.7*     DDimer No results for input(s): DDIMER in the last 168 hours.   Radiology    DG Chest 2 View  Result Date: 01/22/2019 CLINICAL DATA:  Shortness of breath. EXAM: CHEST - 2 VIEW COMPARISON:  11/19/2018 FINDINGS: The patient is rotated to the left on the AP radiograph, and there is exaggerated thoracic kyphosis with the patient's chin obscuring the left greater than right lung apices. Prior sternotomy is again noted. A pacemaker remains in place with leads terminating over the right atrium and right ventricle. The cardiac  silhouette is mildly enlarged. A moderate left pleural effusion has slightly enlarged with increased parenchymal opacity in the left mid and lower lung compared to the prior study. A small right pleural effusion is also now present. No pneumothorax is identified although evaluation for a small pneumothorax is limited on the left. Surgical clips are noted in the right upper abdomen. There is aortic atherosclerosis. No acute osseous abnormality is identified. IMPRESSION: 1. Slight enlargement of a moderate left pleural effusion with increased atelectasis or consolidation in the left mid and lower lung. 2. Small right pleural effusion. Electronically Signed   By: Logan Bores M.D.   On: 01/22/2019 12:12    Cardiac Studies   Echo 10/13/18: IMPRESSIONS  1. The left ventricle has normal systolic function, with an ejection fraction of 55-60%. The cavity size was normal. There is mild concentric left ventricular hypertrophy. Left ventricular diastolic Doppler parameters are consistent with  pseudonormalization. Elevated mean left atrial pressure There is abnormal septal motion consistent with post-operative status. No evidence of left ventricular regional wall motion abnormalities.  2. The right ventricle has mildly reduced systolic function. The cavity was mildly enlarged. There is no increase in right ventricular wall thickness. Right ventricular systolic pressure is mildly elevated with an estimated pressure of 46.9 mmHg.  3. Left atrial size was moderately dilated.  4. Right atrial size was moderately dilated.  5. Tricuspid valve regurgitation is moderate.  6. A a Medtronic Freestyle root valve is present in the aortic position. Echo findings are consistent with thickened leaflets with restricted motion of the aortic prosthesis.  7. The aorta is abnormal unless otherwise noted.  8. The inferior vena cava was dilated in size with <50% respiratory variability.  9. When compared to the prior study: July  2019, despite marked degenerative changes of the aortic prosthesis, there is only very mild aortic stenosis, gradients are only slightly higher compared to 2019. There is evidence of increased right and left  atrial pressure on the current study, but the estimated systolic PA pressure is not changed.   Patient Profile     Ms. Geier is a 86F with CAD s/p CABG, bioprosthetic Medtronic AVR, ascending aorta aneurysm repair, SSS s/p CABG  and recently diagnosed PAF admitted with acute diastolic heart failure.  Assessment & Plan    # Atrial flutter:  Recently diagnosed 11/30 and started on Eliquis.  She has hyperthyroidism being treated with methimazole.   TSH was 0.08 11 months ago.  It now is elevated but improving.  Continue carvedilol and Eliquis.  Her device was interrogated today and she has been persistently in atrial flutter since 11/30.  She now seems to be euvolemic and more stable.  We will plan to set her up for outpatient cardioversion after 12/21.    # Acute diastolic heart failure: She is now euvolemic.  We will switch to Lasix 40 mg daily tomorrow.  Renal function starting to worsen.  Her baseline creatinine is 1.4 and today it is 1.7.    # s/p bioprosthetic AVR: Mean gradient 10 mmHg on 09/2018.  # Hypertension: BP is labile.  Continue carvedilol.  # Dispo:  If Ms. Langhorst is OK with ambulation, she can discharge from a cardiology standpoint.  We will plan for outpatient cardioversion as above.  We will also send a message to Dr. Loletha Grayer in case he does still want to try and overdrive pacer prior to this appointment.      For questions or updates, please contact [facility] Please consult [email or web address] for contact info under        Signed, Skeet Latch, MD  01/24/2019, 8:50 AM

## 2019-01-24 NOTE — Discharge Summary (Signed)
Discharge Summary  Holly Bolton GHW:[id number] DOB: Mar 10, 1922  PCP: Gayland Curry, DO  Admit date: 01/22/2019 Discharge date: 01/24/2019  Time spent: 62mins, more than 50% time spent on coordination of care.  Recommendations for Outpatient Follow-up:  1. F/u with PCP within a week  for hospital discharge follow up, repeat cbc/bmp at follow up 2. F/u with cardiology for outpatient cardioversion, cardiology to follow up repeat echocardiogram result 3. Home health  Discharge Diagnoses:  Active Hospital Problems   Diagnosis Date Noted  . Acute on chronic congestive heart failure ([place]) 01/22/2019  . Chronic kidney disease, stage 3   . A-fib ([place])   . Macrocytic anemia   . Hyperthyroidism 03/20/2018  . Pacemaker, dual chamber St. Jude 2003/2010/2019 07/08/2013  . SSS (sick sinus syndrome) ([place]) 07/08/2013  . Hyperlipidemia 07/08/2013  . Essential hypertension 07/08/2013  . CAD s/p CABG (LIMA to LAD) 07/08/2013    Resolved Hospital Problems  No resolved problems to display.    Discharge Condition: stable  Diet recommendation: heart healthy  Filed Weights   01/23/19 0534 01/24/19 0600 01/24/19 0937  Weight: 39.9 kg 26.3 kg 38.2 kg    History of present illness: (per admitting MD Dr Doristine Bosworth) PCP: Gayland Curry, DO  Patient coming from: Home I have personally briefly reviewed patient's old medical records in [place]  Chief Complaint: Worsening shortness of breath  HPI: Holly Bolton is a 85 y.o. female with medical history significant of hypertension, hyperlipidemia, hyperthyroidism, CKD stage IIIb, coronary artery disease status post CABG, second-degree AV block, sick sinus syndrome status post pacemaker, aortic aneurysm status post ascending aorta repair with bioprosthetic valve/[employer] freestyle, presents to emergency department due to worsening shortness of breath.  Patient's son at bedside is the historian who reports that patient has worsening  shortness of breath started 3 days ago has weight gain of 13 pounds and orthopnea and PND.  Has chronic leg swelling-denies association with worsening, pain, redness.  Son has been giving albuterol breathing treatment with little to no help.  Patient does not have history of COPD and she does not use oxygen at home.  No history of headache, blurry vision, chest pain, palpitation, lightheadedness, dizziness, nausea, vomiting, cough, congestion, fever, chills, diarrhea, decreased appetite or weight loss.  She lives with her son, no history of smoking, alcohol, illicit drug use.  She uses cane for ambulation.  She is compliant with her home medicines and denies increase salt intake.  Has appointment with cardiology tomorrow a.m. for pacemaker interrogation.  ED Course: Upon arrival to ED patient is afebrile, tachypneic, maintaining oxygen saturation on room air, initial troponin 90, BNP elevated at 1716, chest x-ray shows worsening right-sided pleural effusion.  COVID-19 and blood culture pending.  Received Lasix 40 mg IV once in the ED.   Hospital Course:  Principal Problem:   Acute on chronic congestive heart failure (HCC) Active Problems:   CAD s/p CABG (LIMA to LAD)   Hyperlipidemia   SSS (sick sinus syndrome) (HCC)   Pacemaker, dual chamber St. Jude 2003/2010/2019   Essential hypertension   Hyperthyroidism   Chronic kidney disease, stage 3   A-fib (HCC)   Macrocytic anemia   Acute on chronic diastolic CHF exacerbation -Report progressive worsening short of breath in the last 2 weeks, and worsened despite of increased Lasix use at home -Son also report recently diagnosed with A. Fib 2 weeks ago -Chest x-ray on presentation showed bilateral pleural effusion left worse than the right -High-sensitivity  troponin x3, 90 -90-88, BNP 1716 -She improved with Lasix IV 40 mg twice daily , weight on presentation was 95 pounds, today's 84.22 pounds, she is currently on room air at rest, she  denies of chest pain, edema in legs has resolved -Cardiology consulted, input appreciated -Repeat echocardiogram ordered per cardiology recommendation, cardiology to follow up on final reports - patient is cleared to discharge home by cardiology -she is discharged on oral Lasix 40 mg daily starting from tomorrow per cardiology recommendation  Bilateral pleural effusions, left worse than the right -No sign of infection, no fever, no leukocytosis, lactic acid within normal limit , blood culture negative to date ,procalcitonin less than 0.1 -SARS-CoV-2 screening negative - patient has no hypoxia, no cough, improved with iv Lasix, will hold off thoracentesis, this was discussed with son who is in agreement -f/u with cardiology  Paroxysmal A. fib , recently diagnosed 2 weeks ago  H/o sick sinus syndrome status post pacemaker : -Ventricular paced in the hospital -She is on Coreg and a apixaban at home which are resumed -Cardiology consulted, cardiology is to set up follow up appointment, per cardiology patient were to set up for outpatient cardioversion.  Coronary artery disease status post CABG,AVR and ascending aortic aneurysm/root repair : she denies chest pain -Continue Coregand co-Q10.Aspirin recently discontinued by cardiologist-as patient was started on Eliquis. -she is not interested in statin therapy per cardiology note  CKD stage IIIb/ h/o right side nephrectomy in 1958/anemia of chronic disease:  -cr 1.68-1.69-1.73 -Continue to monitor, avoid nephrotoxic medication. -needs to repeat bmp at hospital follow up, further lasix dose adjustment pending renal function test.  Hypertension: Stable -Continue Coreg.  Mild epigastric tenderness, reports chronic for months, no recent changes LFT unremarkable Denies of history of  Gastritis F/u with pcp  Hyperthyroidism: Continue methimazole   FTT; lives with son, son agrees to home health   DVT Prophylaxis: On  Eliquis  Code Status: Full  Family Communication: patient , son at bedside daily  Disposition Plan:  she is cleared by cardiology to discharge home, patient and son agrees with the plan Needs close follow up with pcp and cardiology Home health   Consultants:  Cardiology  Procedures:  None  Antibiotics:  None   Discharge Exam: BP (!) 133/53 (BP Location: Left Arm)   Pulse 70   Temp (!) 96.7 F (35.9 C) (Axillary)   Resp 20   Ht 5\' 3"  (1.6 m)   Wt 38.2 kg   SpO2 98%   BMI 14.92 kg/m    General: Frail elderly sitting up in chair, NAD  Cardiovascular: Paced rhythm  Respiratory: Diminished at bases, no rhonchi ,no wheezing  Abdomen: Mild epigastric tenderness , no guarding, no rebound soft/ND, positive BS  Musculoskeletal: No Edema  Neuro: alert, oriented    Discharge Instructions You were cared for by a hospitalist during your hospital stay. If you have any questions about your discharge medications or the care you received while you were in the hospital after you are discharged, you can call the unit and asked to speak with the hospitalist on call if the hospitalist that took care of you is not available. Once you are discharged, your primary care physician will handle any further medical issues. Please note that NO REFILLS for any discharge medications will be authorized once you are discharged, as it is imperative that you return to your primary care physician (or establish a relationship with a primary care physician if you do not have one)  for your aftercare needs so that they can reassess your need for medications and monitor your lab values.  Discharge Instructions    Diet - low sodium heart healthy   Complete by: As directed    Discharge instructions   Complete by: As directed    Please continue to weight yourself daily, please call your heart doctor if your weight is going up by three lbs in three days. Please have your kidney function checked  next week, further lasix dose adjustment per your doctor.   Increase activity slowly   Complete by: As directed      Allergies as of 01/24/2019      Reactions   Accupril [quinapril Hcl] Other (See Comments)   Unknown reaction    Amlodipine Swelling   Benadryl [diphenhydramine Hcl] Other (See Comments)   Unknown reaction    Biaxin [clarithromycin] Other (See Comments)   Unknown reaction    Ciprofloxacin Other (See Comments)   Unknown reaction    Codeine Other (See Comments)   Unknown reaction    Diovan [valsartan] Other (See Comments)   Unknown reaction    Medrol [methylprednisolone] Other (See Comments)   Unknown   Morphine And Related Other (See Comments)   Unknown reaction    Neurontin [gabapentin] Other (See Comments)   Unknown reaction    Penicillins    Unknown reaction  Has patient had a PCN reaction causing immediate rash, facial/tongue/throat swelling, SOB or lightheadedness with hypotension: Unknown Has patient had a PCN reaction causing severe rash involving mucus membranes or skin necrosis: Unknown Has patient had a PCN reaction that required hospitalization: Unknown Has patient had a PCN reaction occurring within the last 10 years: No If all of the above answers are "NO", then may proceed with Cephalosporin use.      Medication List    STOP taking these medications   magnesium oxide 400 MG tablet Commonly known as: MAG-OX     TAKE these medications   acetaminophen 500 MG tablet Commonly known as: TYLENOL Take 500 mg by mouth every 6 (six) hours as needed for mild pain or headache.   albuterol 0.63 MG/3ML nebulizer solution Commonly known as: ACCUNEB Take 1 ampule by nebulization every 6 (six) hours as needed for wheezing.   apixaban 2.5 MG Tabs tablet Commonly known as: ELIQUIS Take 1 tablet (2.5 mg total) by mouth 2 (two) times daily.   brimonidine 0.2 % ophthalmic solution Commonly known as: ALPHAGAN Place 1 drop into both eyes 2 (two) times  daily.   carvedilol 3.125 MG tablet Commonly known as: COREG Take 1 tablet (3.125 mg total) by mouth 2 (two) times daily with a meal.   CoQ10 200 MG Caps Take 200 mg by mouth daily.   furosemide 40 MG tablet Commonly known as: Lasix Take 1 tablet (40 mg total) by mouth daily. Start taking on: January 25, 2019 What changed:   medication strength  how much to take  additional instructions   methimazole 5 MG tablet Commonly known as: TAPAZOLE Take 1 tablet (5 mg total) by mouth daily.   vitamin B-12 500 MCG tablet Commonly known as: CYANOCOBALAMIN Take 500 mcg by mouth daily.   Vitamin D 50 MCG (2000 UT) Caps Take 1 capsule by mouth daily.      Allergies  Allergen Reactions  . Accupril [Quinapril Hcl] Other (See Comments)    Unknown reaction   . Amlodipine Swelling  . Benadryl [Diphenhydramine Hcl] Other (See Comments)    Unknown reaction   .  Biaxin [Clarithromycin] Other (See Comments)    Unknown reaction   . Ciprofloxacin Other (See Comments)    Unknown reaction   . Codeine Other (See Comments)    Unknown reaction   . Diovan [Valsartan] Other (See Comments)    Unknown reaction   . Medrol [Methylprednisolone] Other (See Comments)    Unknown  . Morphine And Related Other (See Comments)    Unknown reaction   . Neurontin [Gabapentin] Other (See Comments)    Unknown reaction   . Penicillins     Unknown reaction  Has patient had a PCN reaction causing immediate rash, facial/tongue/throat swelling, SOB or lightheadedness with hypotension: Unknown Has patient had a PCN reaction causing severe rash involving mucus membranes or skin necrosis: Unknown Has patient had a PCN reaction that required hospitalization: Unknown Has patient had a PCN reaction occurring within the last 10 years: No If all of the above answers are "NO", then may proceed with Cephalosporin use.    Follow-up Information    Reed, Tiffany L, DO Follow up on 01/30/2019.   Specialty: Geriatric  Medicine Why: hospital discharge follow up, repeat cbc/bmp at follow up Contact information: [place]. [place] [address] [phone]        [facility] Follow up.   Specialty: Cardiology Why: please call ahead of time for outpatient cardioversion after 12/21. Contact information: [address] [place] [phone]           The results of significant diagnostics from this hospitalization (including imaging, microbiology, ancillary and laboratory) are listed below for reference.    Significant Diagnostic Studies: DG Chest 2 View  Result Date: 01/22/2019 CLINICAL DATA:  Shortness of breath. EXAM: CHEST - 2 VIEW COMPARISON:  11/19/2018 FINDINGS: The patient is rotated to the left on the AP radiograph, and there is exaggerated thoracic kyphosis with the patient's chin obscuring the left greater than right lung apices. Prior sternotomy is again noted. A pacemaker remains in place with leads terminating over the right atrium and right ventricle. The cardiac silhouette is mildly enlarged. A moderate left pleural effusion has slightly enlarged with increased parenchymal opacity in the left mid and lower lung compared to the prior study. A small right pleural effusion is also now present. No pneumothorax is identified although evaluation for a small pneumothorax is limited on the left. Surgical clips are noted in the right upper abdomen. There is aortic atherosclerosis. No acute osseous abnormality is identified. IMPRESSION: 1. Slight enlargement of a moderate left pleural effusion with increased atelectasis or consolidation in the left mid and lower lung. 2. Small right pleural effusion. Electronically Signed   By: Logan Bores M.D.   On: 01/22/2019 12:12    Microbiology: Recent Results (from the past 240 hour(s))  Blood culture (routine x 2)     Status: None (Preliminary result)   Collection Time: 01/22/19  4:27 PM    Specimen: BLOOD  Result Value Ref Range Status   Specimen Description BLOOD LEFT ANTECUBITAL  Final   Special Requests   Final    BOTTLES DRAWN AEROBIC AND ANAEROBIC Blood Culture adequate volume   Culture   Final    NO GROWTH < 24 HOURS Performed at [facility], [address], [place], [place] [address]    Report Status PENDING  Incomplete  Blood culture (routine x 2)     Status: None (Preliminary result)   Collection Time: 01/22/19  5:31 PM  Specimen: BLOOD  Result Value Ref Range Status   Specimen Description BLOOD BLOOD RIGHT FOREARM  Final   Special Requests   Final    BOTTLES DRAWN AEROBIC ONLY Blood Culture results may not be optimal due to an inadequate volume of blood received in culture bottles   Culture   Final    NO GROWTH < 24 HOURS Performed at [facility] [address], [place], [place] [address]    Report Status PENDING  Incomplete  SARS CORONAVIRUS 2 (TAT 6-24 HRS) Nasopharyngeal Nasopharyngeal Swab     Status: None   Collection Time: 01/22/19  8:07 PM   Specimen: Nasopharyngeal Swab  Result Value Ref Range Status   SARS Coronavirus 2 NEGATIVE NEGATIVE Final    Comment: (NOTE) SARS-CoV-2 target nucleic acids are NOT DETECTED. The SARS-CoV-2 RNA is generally detectable in upper and lower respiratory specimens during the acute phase of infection. Negative results do not preclude SARS-CoV-2 infection, do not rule out co-infections with other pathogens, and should not be used as the sole basis for treatment or other patient management decisions. Negative results must be combined with clinical observations, patient history, and epidemiological information. The expected result is Negative. Fact Sheet for Patients: SugarRoll.be Fact Sheet for Healthcare Providers: https://www.woods-mathews.com/ This test is not yet approved or cleared by the [place] FDA and  has been authorized for detection and/or  diagnosis of SARS-CoV-2 by FDA under an Emergency Use Authorization (EUA). This EUA will remain  in effect (meaning this test can be used) for the duration of the COVID-19 declaration under Section 56 4(b)(1) of the Act, 21 U.S.C. section 360bbb-3(b)(1), unless the authorization is terminated or revoked sooner. Performed at [facility], [place] [address], [place], [place] [address]      Labs: Basic Metabolic Panel: Recent Labs  Lab 01/22/19 1145 01/22/19 1732 01/23/19 0251 01/24/19 0326  NA 142  --  143 143  K 5.1  --  4.0 3.5  CL 104  --  100 99  CO2 29  --  31 34*  GLUCOSE 200*  --  89 103*  BUN 37*  --  37* 37*  CREATININE 1.68*  --  1.69* 1.73*  CALCIUM 9.3  --  9.1 9.0  MG  --  2.3  --  2.0  PHOS  --  4.2  --   --    Liver Function Tests: Recent Labs  Lab 01/22/19 1335 01/23/19 0251  AST 20 18  ALT 11 14  ALKPHOS 75 68  BILITOT 0.7 1.1  PROT 6.6 5.8*  ALBUMIN 3.7 3.4*   No results for input(s): LIPASE, AMYLASE in the last 168 hours. No results for input(s): AMMONIA in the last 168 hours. CBC: Recent Labs  Lab 01/22/19 1145 01/23/19 0251  WBC 5.6 6.4  HGB 11.9* 11.3*  HCT 37.9 34.8*  MCV 106.5* 101.5*  PLT 183 205   Cardiac Enzymes: No results for input(s): CKTOTAL, CKMB, CKMBINDEX, TROPONINI in the last 168 hours. BNP: BNP (last 3 results) Recent Labs    10/11/18 1500 10/14/18 0543 01/22/19 1145  BNP 2,821.5* 901.8* 1,716.7*    ProBNP (last 3 results) No results for input(s): PROBNP in the last 8760 hours.  CBG: No results for input(s): GLUCAP in the last 168 hours.     Signed:  Florencia Reasons MD, PhD, FACP  [facility] 01/24/2019, 11:19 AM

## 2019-01-24 NOTE — Progress Notes (Signed)
   Asked to arrange an outpatient DCCV. Scheduled for 02/10/2019 at 9:30am with Dr. Oval Linsey. Instructions added to the AVS. Will arrange outpatient follow-up after her cardioversion.   Abigail Butts, PA-C 01/24/19; 1:35 PM

## 2019-01-24 NOTE — TOC Transition Note (Signed)
Transition of Care [facility]) - CM/SW Discharge Note   Patient Details  Name: Holly Bolton MRN: [id number] Date of Birth: January 07, 1923  Transition of Care [facility]) CM/SW Contact:  Zenon Mayo, RN Phone Number: 01/24/2019, 11:35 AM   Clinical Narrative:    Patient for dc today, NCM spoke with son at bedside, offered choice, he states they would like [facility] for [facility], HHPT, referral made to [facility] with [facility], they can take referral. Soc will begin 24 to 48 hrs post dc.    Final next level of care: Home w Home Health Services Barriers to Discharge: No Barriers Identified   Patient Goals and CMS Choice     Choice offered to / list presented to : Patient, Adult Children  Discharge Placement                       Discharge Plan and Services                DME Arranged: (NA)         HH Arranged: RN, PT HH Agency: [place] ([facility]) Date [place]: 01/24/19 Time [place]: [id number] Representative spoke with at [place]: [place] ([place]) Interventions     Readmission Risk Interventions No flowsheet data found.

## 2019-01-24 NOTE — H&P (View-Only) (Signed)
Thanks for the follow-up Tiffany.  Go ahead and schedule her for the cardioversion.  We can try to overdrive pace her before we shock her.

## 2019-01-24 NOTE — Progress Notes (Signed)
PT Cancellation Note  Patient Details Name: Holly Bolton MRN: [id number] DOB: 1922-07-31   Cancelled Treatment:    Reason Eval/Treat Not Completed: Other (comment). Attempted but pt politely declined and wanted to finish napping. I will check back in the next hour.   Shary Decamp [facility] 01/24/2019, 11:44 AM [place] Pager [phone] Office [phone]

## 2019-01-24 NOTE — Plan of Care (Signed)
  Problem: Nutrition: Goal: Adequate nutrition will be maintained Outcome: Completed/Met   Problem: Elimination: Goal: Will not experience complications related to urinary retention Outcome: Completed/Met   Problem: Pain Managment: Goal: General experience of comfort will improve Outcome: Completed/Met

## 2019-01-26 ENCOUNTER — Encounter: Payer: Self-pay | Admitting: Internal Medicine

## 2019-01-27 ENCOUNTER — Encounter: Payer: Medicare Other | Admitting: Cardiovascular Disease

## 2019-01-27 DIAGNOSIS — I2510 Atherosclerotic heart disease of native coronary artery without angina pectoris: Secondary | ICD-10-CM | POA: Diagnosis not present

## 2019-01-27 DIAGNOSIS — G3184 Mild cognitive impairment, so stated: Secondary | ICD-10-CM | POA: Diagnosis not present

## 2019-01-27 DIAGNOSIS — I5033 Acute on chronic diastolic (congestive) heart failure: Secondary | ICD-10-CM | POA: Diagnosis not present

## 2019-01-27 DIAGNOSIS — N183 Chronic kidney disease, stage 3 unspecified: Secondary | ICD-10-CM | POA: Diagnosis not present

## 2019-01-27 DIAGNOSIS — J4520 Mild intermittent asthma, uncomplicated: Secondary | ICD-10-CM | POA: Diagnosis not present

## 2019-01-27 DIAGNOSIS — I130 Hypertensive heart and chronic kidney disease with heart failure and stage 1 through stage 4 chronic kidney disease, or unspecified chronic kidney disease: Secondary | ICD-10-CM | POA: Diagnosis not present

## 2019-01-27 LAB — CULTURE, BLOOD (ROUTINE X 2)
Culture: NO GROWTH
Culture: NO GROWTH
Special Requests: ADEQUATE

## 2019-01-27 NOTE — Telephone Encounter (Signed)
Message routed to Reed, Tiffany L, DO  

## 2019-01-28 ENCOUNTER — Telehealth: Payer: Self-pay | Admitting: *Deleted

## 2019-01-28 DIAGNOSIS — N183 Chronic kidney disease, stage 3 unspecified: Secondary | ICD-10-CM | POA: Diagnosis not present

## 2019-01-28 DIAGNOSIS — J4520 Mild intermittent asthma, uncomplicated: Secondary | ICD-10-CM | POA: Diagnosis not present

## 2019-01-28 DIAGNOSIS — G3184 Mild cognitive impairment, so stated: Secondary | ICD-10-CM | POA: Diagnosis not present

## 2019-01-28 DIAGNOSIS — I130 Hypertensive heart and chronic kidney disease with heart failure and stage 1 through stage 4 chronic kidney disease, or unspecified chronic kidney disease: Secondary | ICD-10-CM | POA: Diagnosis not present

## 2019-01-28 DIAGNOSIS — I5033 Acute on chronic diastolic (congestive) heart failure: Secondary | ICD-10-CM | POA: Diagnosis not present

## 2019-01-28 DIAGNOSIS — I2510 Atherosclerotic heart disease of native coronary artery without angina pectoris: Secondary | ICD-10-CM | POA: Diagnosis not present

## 2019-01-28 NOTE — Telephone Encounter (Signed)
Transition Care Management Follow-up Telephone Call  Date of discharge and from where: 01/24/19 [place]  How have you been since you were released from the hospital? better  Any questions or concerns? No  Had question regarding patient's Furosemide but had it answered by Dr. Mariea Clonts through myChart  Items Reviewed:  Did the pt receive and understand the discharge instructions provided? Yes   Medications obtained and verified? Yes   Any new allergies since your discharge? No   Dietary orders reviewed? Yes  Do you have support at home? Yes   Other (ie: DME, Home Health, etc) Home Health  Functional Questionnaire: (I = Independent and D = Dependent) ADL's: I with Assistance  Bathing/Dressing- I with assisatance3   Meal Prep- D  Eating- I  Maintaining continence- I  Transferring/Ambulation- I with assistance  Managing Meds- D   Follow up appointments reviewed:    PCP Hospital f/u appt confirmed? Yes  Scheduled to see Dr. Mariea Clonts on 12/17.  [facility] f/u appt confirmed? Yes  Scheduled to see Cardiology  Are transportation arrangements needed? No   If their condition worsens, is the pt aware to call  their PCP or go to the ED? Yes  Was the patient provided with contact information for the PCP's office or ED? Yes  Was the pt encouraged to call back with questions or concerns? Yes

## 2019-01-30 ENCOUNTER — Encounter: Payer: Self-pay | Admitting: Internal Medicine

## 2019-01-30 ENCOUNTER — Other Ambulatory Visit: Payer: Self-pay

## 2019-01-30 ENCOUNTER — Ambulatory Visit (INDEPENDENT_AMBULATORY_CARE_PROVIDER_SITE_OTHER): Payer: Medicare Other | Admitting: Internal Medicine

## 2019-01-30 VITALS — BP 136/78 | HR 69 | Temp 97.1°F | Ht 63.0 in | Wt 89.9 lb

## 2019-01-30 DIAGNOSIS — I4891 Unspecified atrial fibrillation: Secondary | ICD-10-CM

## 2019-01-30 DIAGNOSIS — I5033 Acute on chronic diastolic (congestive) heart failure: Secondary | ICD-10-CM | POA: Diagnosis not present

## 2019-01-30 DIAGNOSIS — Z7189 Other specified counseling: Secondary | ICD-10-CM

## 2019-01-30 NOTE — Progress Notes (Signed)
Location:   [place]   Place of Service:    Provider: Dr. Hollace Kinnier  Goals of Care:  Advanced Directives 01/22/2019  Does Patient Have a Medical Advance Directive? No  Type of Advance Directive -  Does patient want to make changes to medical advance directive? -  Would patient like information on creating a medical advance directive? No - Patient declined     Chief Complaint  Patient presents with  . Medical Management of Chronic Issues    4 month  follow up, Patient son [name] is here with here . Wants the Advance Care Directive paper and would like it explained to son     HPI: Patient is a 85 y.o. female seen today for hospital follow-up s/p admission from [facility] on 01/22/2019 to 01/24/2019. She was admitted for worsening shortness of breath and 13 pound weight gain. During admission, she was afebrile and tachypneic. BNP was elevated at 1716 and chest x-ray showed right-sided pleural effusion. During her hospital stay she was treated for CHF exacerbation and given IV lasix. After two days she was discharged with a prescription of 40 mg lasix PO and orders for home health. She returned  home that she shares with her son.  Son present for visit today.    Prior to hospitalization, cardiologist noticed new onset afib and started her on eliquis. She was seen by cardiology during her hospital stay. The plan to do cardioversion was determined during her hospitalization. Cardioversion is scheduled outpatient for 02/10/2019.   Since admission, Lasix at 40 mg seems to have dehydrated her. They have decreased her dose to 20 mg and she seems to be tolerating the decreased dose well. They are also doing daily weights, and she appears to have maintain her weight at 87 pounds.   Still having [place] come for weekly nurse checks and physical therapy.   Her mobility has improved and she is less shaky. Ambulated with a cane. She will become slightly short of breath while  working with PT, but it will subside when resting.   Appetite is fair. She is eating at least 1-2 meals a day. Meals consist of vegetable and fruit. She also receives one Boost daily. Drinking 3 glasses of water daily in addition to Boost.   She is sleeping well at night. Averages about 8 hours a night. Takes multiple naps daily.   No falls or recent injuries.            Past Medical History:  Diagnosis Date  . Aneurysm (arteriovenous) of coronary vessels    ascending aorta requiring bypass of the LAD  . Carotid stenosis 04/28/08   Doppler: <40% stenosis bilateral  . Chronic kidney disease, stage 3   . Hyperlipemia   . Hypertension   . Hypertensive chronic kidney disease   . Hypothyroidism, unspecified 02/13/2018   Symptoms well controlled with current therapy  . Mild cognitive impairment, so stated 03/28/2018   Symptoms poorly controlled, needs frequent adjustments in treatment and dose monitoring  . Mild intermittent asthma    Symptoms controlled with difficulty, affecting daily functioning, needs ongoing monitoring  . Occlusion and stenosis of bilateral carotid arteries   . Pacemaker generator end of life 11/18/08   Intermittent high-grade atrioventricular block  . Peripheral arterial disease (HCC)    left ABI of 0.78  . Presence of permanent cardiac pacemaker 08/26/01   Sinus node dysfunction-[place]  . Presence of xenogenic heart valve   . PVD (peripheral  vascular disease) ([place])   . SSS (sick sinus syndrome) ([place])   . Status post ascending aortic aneurysm repair/AVR -  Medtronic Freestyle root 09/28/2014   ascending aorta requiring bypass of the LAD     Past Surgical History:  Procedure Laterality Date  . ABDOMINAL HYSTERECTOMY  1966  . APPENDECTOMY  1943  . ASCENDING AORTIC ANEURYSM REPAIR  09/22/97   porcine aortic root  . BREAST FIBROADENOMA SURGERY  11/89  . CARDIAC SURGERY  09/22/1997   aortic valve root repair with porcine at time of CABG  . CORONARY  ARTERY BYPASS GRAFT  09/22/97   LIMA to the LAD  . I & D EXTREMITY Left 08/15/2017   Procedure: IRRIGATION AND DEBRIDEMENT EXTREMITY;  Surgeon: Nicholes Stairs, MD;  Location: [place];  Service: Orthopedics;  Laterality: Left;  . [place]   left  . NEPHRECTOMY  1958   right  . ORIF HUMERUS FRACTURE Left 08/15/2017   Procedure: OPEN REDUCTION INTERNAL FIXATION (ORIF) PROXIMAL HUMERUS FRACTURE;  Surgeon: Nicholes Stairs, MD;  Location: [place];  Service: Orthopedics;  Laterality: Left;  . OVARIAN CYST SURGERY  1948  . PACEMAKER GENERATOR CHANGE  11/18/08   [place]  . PACEMAKER INSERTION  08/26/01   [place]  . PPM GENERATOR CHANGEOUT N/A 05/16/2017   Procedure: PPM GENERATOR CHANGEOUT;  Surgeon: Sanda Klein, MD;  Location: [place] [facility];  Service: Cardiovascular;  Laterality: N/A;    Allergies  Allergen Reactions  . Accupril [Quinapril Hcl] Other (See Comments)    Unknown reaction   . Amlodipine Swelling  . Benadryl [Diphenhydramine Hcl] Other (See Comments)    Unknown reaction   . Biaxin [Clarithromycin] Other (See Comments)    Unknown reaction   . Ciprofloxacin Other (See Comments)    Unknown reaction   . Codeine Other (See Comments)    Unknown reaction   . Diovan [Valsartan] Other (See Comments)    Unknown reaction   . Medrol [Methylprednisolone] Other (See Comments)    Unknown  . Morphine And Related Other (See Comments)    Unknown reaction   . Neurontin [Gabapentin] Other (See Comments)    Unknown reaction   . Penicillins     Unknown reaction  Has patient had a PCN reaction causing immediate rash, facial/tongue/throat swelling, SOB or lightheadedness with hypotension: Unknown Has patient had a PCN reaction causing severe rash involving mucus membranes or skin necrosis: Unknown Has patient had a PCN reaction that required hospitalization: Unknown Has patient had a PCN reaction occurring within the last 10 years: No If all of the above answers  are "NO", then may proceed with Cephalosporin use.     Outpatient Encounter Medications as of 01/30/2019  Medication Sig  . acetaminophen (TYLENOL) 500 MG tablet Take 500 mg by mouth every 6 (six) hours as needed for mild pain or headache.  . albuterol (ACCUNEB) 0.63 MG/3ML nebulizer solution Take 1 ampule by nebulization every 6 (six) hours as needed for wheezing.  Marland Kitchen apixaban (ELIQUIS) 2.5 MG TABS tablet Take 1 tablet (2.5 mg total) by mouth 2 (two) times daily.  . brimonidine (ALPHAGAN) 0.2 % ophthalmic solution Place 1 drop into both eyes 2 (two) times daily.   . carvedilol (COREG) 3.125 MG tablet Take 1 tablet (3.125 mg total) by mouth 2 (two) times daily with a meal.  . Cholecalciferol (VITAMIN D) 50 MCG (2000 UT) CAPS Take 1 capsule by mouth daily.  . Coenzyme Q10 (COQ10) 200 MG CAPS Take  200 mg by mouth daily.  . furosemide (LASIX) 40 MG tablet Take 1 tablet (40 mg total) by mouth daily.  . methimazole (TAPAZOLE) 5 MG tablet Take 1 tablet (5 mg total) by mouth daily.  . vitamin B-12 (CYANOCOBALAMIN) 500 MCG tablet Take 500 mcg by mouth daily.   No facility-administered encounter medications on file as of 01/30/2019.    Review of Systems:  Review of Systems  Constitutional: Positive for activity change. Negative for appetite change and fatigue.  Respiratory: Negative for cough, shortness of breath and wheezing.   Cardiovascular: Negative for chest pain, palpitations and leg swelling.  Gastrointestinal: Negative for abdominal pain, constipation, diarrhea and nausea.  Genitourinary: Positive for frequency. Negative for dysuria and hematuria.  Musculoskeletal: Positive for arthralgias.       Left knee pain  Neurological: Positive for weakness. Negative for dizziness, syncope and headaches.  Psychiatric/Behavioral: Negative for dysphoric mood and sleep disturbance. The patient is not nervous/anxious.     Health Maintenance  Topic Date Due  . DEXA SCAN  01/14/2020 (Originally  10/17/1989)  . TETANUS/TDAP  08/15/2027  . INFLUENZA VACCINE  Completed  . PNA vac Low Risk Adult  Completed    Physical Exam: Vitals:   01/30/19 1304  Weight: 89 lb 14.4 oz (40.8 kg)   Body mass index is 15.93 kg/m. Physical Exam Vitals reviewed.  Constitutional:      General: She is not in acute distress.    Appearance: Normal appearance. She is normal weight.  HENT:     Head: Normocephalic.  Cardiovascular:     Rate and Rhythm: Normal rate and regular rhythm.     Pulses: Normal pulses.     Heart sounds: Normal heart sounds. No murmur.  Pulmonary:     Effort: Pulmonary effort is normal. No respiratory distress.     Breath sounds: Normal breath sounds.  Abdominal:     General: Abdomen is flat. Bowel sounds are normal.     Palpations: Abdomen is soft.  Musculoskeletal:     Right lower leg: No edema.     Left lower leg: No edema.  Skin:    General: Skin is warm and dry.     Capillary Refill: Capillary refill takes less than 2 seconds.  Neurological:     General: No focal deficit present.     Mental Status: She is alert and oriented to person, place, and time. Mental status is at baseline.  Psychiatric:        Mood and Affect: Mood normal.        Behavior: Behavior normal.        Thought Content: Thought content normal.        Judgment: Judgment normal.     Labs reviewed: Basic Metabolic Panel: Recent Labs    10/11/18 2301 10/12/18 0426 10/15/18 0504 11/19/18 1018 01/22/19 1145 01/22/19 1732 01/23/19 0251 01/24/19 0326  NA  --   --  138 140 142  --  143 143  K  --   --  3.8 4.4 5.1  --  4.0 3.5  CL  --   --  98 104 104  --  100 99  CO2  --   --  30 29 29   --  31 34*  GLUCOSE  --   --  105* 117* 200*  --  89 103*  BUN  --   --  36* 35* 37*  --  37* 37*  CREATININE 1.66*   < > 1.71* 1.44* 1.68*  --  1.69* 1.73*  CALCIUM  --   --  9.0 9.3 9.3  --  9.1 9.0  MG 2.3  --  2.0  --   --  2.3  --  2.0  PHOS 4.2  --   --   --   --  4.2  --   --   TSH 8.881*  --    --  14.08*  --   --   --  7.923*   < > = values in this interval not displayed.   Liver Function Tests: Recent Labs    10/11/18 1715 11/19/18 1018 01/22/19 1335 01/23/19 0251  AST 22 17 20 18   ALT 19 10 11 14   ALKPHOS 80  --  75 68  BILITOT 1.3* 1.2 0.7 1.1  PROT 6.9 6.6 6.6 5.8*  ALBUMIN 3.8  --  3.7 3.4*   Recent Labs    10/11/18 1715  LIPASE 51   No results for input(s): AMMONIA in the last 8760 hours. CBC: Recent Labs    09/26/18 1225 10/24/18 0909 11/19/18 1018 01/22/19 1145 01/23/19 0251  WBC 6.6 4.4 5.9 5.6 6.4  NEUTROABS 4,653 2,328 2,985  --   --   HGB 10.4* 9.6* 10.2* 11.9* 11.3*  HCT 31.9* 29.5* 31.0* 37.9 34.8*  MCV 100.9* 101.0* 101.3* 106.5* 101.5*  PLT 210 220 132* 183 205   Lipid Panel: No results for input(s): CHOL, HDL, LDLCALC, TRIG, CHOLHDL, LDLDIRECT in the last 8760 hours. No results found for: HGBA1C  Procedures since last visit: DG Chest 2 View  Result Date: 01/22/2019 CLINICAL DATA:  Shortness of breath. EXAM: CHEST - 2 VIEW COMPARISON:  11/19/2018 FINDINGS: The patient is rotated to the left on the AP radiograph, and there is exaggerated thoracic kyphosis with the patient's chin obscuring the left greater than right lung apices. Prior sternotomy is again noted. A pacemaker remains in place with leads terminating over the right atrium and right ventricle. The cardiac silhouette is mildly enlarged. A moderate left pleural effusion has slightly enlarged with increased parenchymal opacity in the left mid and lower lung compared to the prior study. A small right pleural effusion is also now present. No pneumothorax is identified although evaluation for a small pneumothorax is limited on the left. Surgical clips are noted in the right upper abdomen. There is aortic atherosclerosis. No acute osseous abnormality is identified. IMPRESSION: 1. Slight enlargement of a moderate left pleural effusion with increased atelectasis or consolidation in the left mid  and lower lung. 2. Small right pleural effusion. Electronically Signed   By: Logan Bores M.D.   On: 01/22/2019 12:12    Assessment/Plan 1. New onset a-fib [facility]) - followed by cardiology - stable at this time - continue eliquis   2. Acute on chronic diastolic CHF (congestive heart failure) (HCC) - stable at this time - continue lasix 20 mg daily - continue daily weights and DASH diet - basic metabolic panel- today - complete blood count with differential/platelets- today     Labs/tests ordered:  Complete blood count with differential/platelets, basic metabolic panel- today Next appt:  1 month follow up

## 2019-01-31 ENCOUNTER — Encounter: Payer: Self-pay | Admitting: *Deleted

## 2019-01-31 DIAGNOSIS — I5033 Acute on chronic diastolic (congestive) heart failure: Secondary | ICD-10-CM | POA: Diagnosis not present

## 2019-01-31 DIAGNOSIS — I130 Hypertensive heart and chronic kidney disease with heart failure and stage 1 through stage 4 chronic kidney disease, or unspecified chronic kidney disease: Secondary | ICD-10-CM | POA: Diagnosis not present

## 2019-01-31 DIAGNOSIS — G3184 Mild cognitive impairment, so stated: Secondary | ICD-10-CM | POA: Diagnosis not present

## 2019-01-31 DIAGNOSIS — N183 Chronic kidney disease, stage 3 unspecified: Secondary | ICD-10-CM | POA: Diagnosis not present

## 2019-01-31 DIAGNOSIS — J4520 Mild intermittent asthma, uncomplicated: Secondary | ICD-10-CM | POA: Diagnosis not present

## 2019-01-31 DIAGNOSIS — I2510 Atherosclerotic heart disease of native coronary artery without angina pectoris: Secondary | ICD-10-CM | POA: Diagnosis not present

## 2019-01-31 LAB — CBC WITH DIFFERENTIAL/PLATELET
Absolute Monocytes: 1031 cells/uL — ABNORMAL HIGH (ref 200–950)
Basophils Absolute: 31 cells/uL (ref 0–200)
Basophils Relative: 0.5 %
Eosinophils Absolute: 43 cells/uL (ref 15–500)
Eosinophils Relative: 0.7 %
HCT: 34.7 % — ABNORMAL LOW (ref 35.0–45.0)
Hemoglobin: 11.7 g/dL (ref 11.7–15.5)
Lymphs Abs: 1000 cells/uL (ref 850–3900)
MCH: 33.9 pg — ABNORMAL HIGH (ref 27.0–33.0)
MCHC: 33.7 g/dL (ref 32.0–36.0)
MCV: 100.6 fL — ABNORMAL HIGH (ref 80.0–100.0)
MPV: 12.4 fL (ref 7.5–12.5)
Monocytes Relative: 16.9 %
Neutro Abs: 3996 cells/uL (ref 1500–7800)
Neutrophils Relative %: 65.5 %
Platelets: 193 10*3/uL (ref 140–400)
RBC: 3.45 10*6/uL — ABNORMAL LOW (ref 3.80–5.10)
RDW: 15.2 % — ABNORMAL HIGH (ref 11.0–15.0)
Total Lymphocyte: 16.4 %
WBC: 6.1 10*3/uL (ref 3.8–10.8)

## 2019-01-31 LAB — BASIC METABOLIC PANEL
BUN/Creatinine Ratio: 29 (calc) — ABNORMAL HIGH (ref 6–22)
BUN: 46 mg/dL — ABNORMAL HIGH (ref 7–25)
CO2: 26 mmol/L (ref 20–32)
Calcium: 8.8 mg/dL (ref 8.6–10.4)
Chloride: 101 mmol/L (ref 98–110)
Creat: 1.61 mg/dL — ABNORMAL HIGH (ref 0.60–0.88)
Glucose, Bld: 105 mg/dL (ref 65–139)
Potassium: 4.9 mmol/L (ref 3.5–5.3)
Sodium: 140 mmol/L (ref 135–146)

## 2019-01-31 NOTE — Progress Notes (Signed)
Holly Bolton's labs look good.  Her kidney function is a smidge better than a week ago.  Her sodium and potassium are normal.  Her anemia is a bit improved.

## 2019-02-03 ENCOUNTER — Telehealth: Payer: Self-pay | Admitting: Cardiovascular Disease

## 2019-02-03 NOTE — Telephone Encounter (Signed)
Pt's Son is calling in for instructions for pt's upcoming cardioversion, went over directions as follows  Dear Holly Bolton/Holly Bolton Hollace Kinnier are scheduled for a Cardioversion on 02-10-2019 with Dr. Oval Linsey.  Please arrive at the [facility] (Main Entrance A) at [facility]: [address] [place], [place] [address] at 730am (1 hour prior to procedure unless lab work is needed; if lab work is needed arrive 1.5 hours ahead)  DIET: Nothing to eat or drink after midnight except a sip of water with medications (see medication instructions below)  Medication Instructions:  Continue your anticoagulant: Eliquis.  You will need to continue your anticoagulant after your procedure until you are told by your Provider that it is safe to stop.   Labs:   Come to: [facility] OFFICE FOR YOUR LABWORK 02-05-2019.   You must have a responsible person to drive you home and stay in the waiting area during your procedure. Failure to do so could result in cancellation.  Bring your insurance cards.  *Special Note: Every effort is made to have your procedure done on time. Occasionally there are emergencies that occur at the hospital that may cause delays. Please be patient if a delay does occur.    Pt's son verbalizes understanding and will bring pt to [facility] lab wed and will bring pt to [place] at appointment time for cardioversion.

## 2019-02-03 NOTE — Telephone Encounter (Signed)
New message   Son calling, requesting instructions for 12/28 procedure. Please call

## 2019-02-05 ENCOUNTER — Other Ambulatory Visit: Payer: Self-pay | Admitting: *Deleted

## 2019-02-05 ENCOUNTER — Ambulatory Visit (INDEPENDENT_AMBULATORY_CARE_PROVIDER_SITE_OTHER): Payer: Medicare Other | Admitting: *Deleted

## 2019-02-05 ENCOUNTER — Inpatient Hospital Stay (HOSPITAL_COMMUNITY): Admission: RE | Admit: 2019-02-05 | Payer: Medicare Other | Source: Ambulatory Visit

## 2019-02-05 DIAGNOSIS — I495 Sick sinus syndrome: Secondary | ICD-10-CM | POA: Diagnosis not present

## 2019-02-05 DIAGNOSIS — I130 Hypertensive heart and chronic kidney disease with heart failure and stage 1 through stage 4 chronic kidney disease, or unspecified chronic kidney disease: Secondary | ICD-10-CM | POA: Diagnosis not present

## 2019-02-05 DIAGNOSIS — I480 Paroxysmal atrial fibrillation: Secondary | ICD-10-CM

## 2019-02-05 DIAGNOSIS — I10 Essential (primary) hypertension: Secondary | ICD-10-CM

## 2019-02-05 DIAGNOSIS — J4520 Mild intermittent asthma, uncomplicated: Secondary | ICD-10-CM | POA: Diagnosis not present

## 2019-02-05 DIAGNOSIS — I2510 Atherosclerotic heart disease of native coronary artery without angina pectoris: Secondary | ICD-10-CM | POA: Diagnosis not present

## 2019-02-05 DIAGNOSIS — G3184 Mild cognitive impairment, so stated: Secondary | ICD-10-CM | POA: Diagnosis not present

## 2019-02-05 DIAGNOSIS — I5033 Acute on chronic diastolic (congestive) heart failure: Secondary | ICD-10-CM | POA: Diagnosis not present

## 2019-02-05 DIAGNOSIS — N183 Chronic kidney disease, stage 3 unspecified: Secondary | ICD-10-CM | POA: Diagnosis not present

## 2019-02-05 DIAGNOSIS — Z01818 Encounter for other preprocedural examination: Secondary | ICD-10-CM

## 2019-02-05 LAB — CBC
Hematocrit: 33.5 % — ABNORMAL LOW (ref 34.0–46.6)
Hemoglobin: 11.4 g/dL (ref 11.1–15.9)
MCH: 32.9 pg (ref 26.6–33.0)
MCHC: 34 g/dL (ref 31.5–35.7)
MCV: 97 fL (ref 79–97)
Platelets: 224 10*3/uL (ref 150–450)
RBC: 3.47 x10E6/uL — ABNORMAL LOW (ref 3.77–5.28)
RDW: 15 % (ref 11.7–15.4)
WBC: 5.9 10*3/uL (ref 3.4–10.8)

## 2019-02-06 ENCOUNTER — Other Ambulatory Visit: Payer: Self-pay | Admitting: Cardiovascular Disease

## 2019-02-06 ENCOUNTER — Other Ambulatory Visit (HOSPITAL_COMMUNITY)
Admission: RE | Admit: 2019-02-06 | Discharge: 2019-02-06 | Disposition: A | Discharge status: Home / Self Care | Payer: Medicare Other | Source: Ambulatory Visit | Attending: Cardiovascular Disease | Admitting: Cardiovascular Disease

## 2019-02-06 DIAGNOSIS — Z01812 Encounter for preprocedural laboratory examination: Secondary | ICD-10-CM | POA: Insufficient documentation

## 2019-02-06 DIAGNOSIS — Z20828 Contact with and (suspected) exposure to other viral communicable diseases: Secondary | ICD-10-CM | POA: Insufficient documentation

## 2019-02-06 LAB — CUP PACEART REMOTE DEVICE CHECK
Battery Remaining Longevity: 119 mo
Battery Remaining Percentage: 95.5 %
Battery Voltage: 2.99 V
Brady Statistic AP VP Percent: 27 %
Brady Statistic AP VS Percent: 1 %
Brady Statistic AS VP Percent: 50 %
Brady Statistic AS VS Percent: 22 %
Brady Statistic RA Percent Paced: 26 %
Brady Statistic RV Percent Paced: 79 %
Date Time Interrogation Session: 20201223072927
Implantable Lead Implant Date: 20030714
Implantable Lead Implant Date: 20030714
Implantable Lead Location: 753859
Implantable Lead Location: 753860
Implantable Pulse Generator Implant Date: 20190403
Lead Channel Impedance Value: 350 Ohm
Lead Channel Impedance Value: 480 Ohm
Lead Channel Pacing Threshold Amplitude: 0.625 V
Lead Channel Pacing Threshold Amplitude: 0.75 V
Lead Channel Pacing Threshold Pulse Width: 0.5 ms
Lead Channel Pacing Threshold Pulse Width: 0.5 ms
Lead Channel Sensing Intrinsic Amplitude: 1.5 mV
Lead Channel Sensing Intrinsic Amplitude: 3.8 mV
Lead Channel Setting Pacing Amplitude: 1 V
Lead Channel Setting Pacing Amplitude: 1.625
Lead Channel Setting Pacing Pulse Width: 0.5 ms
Lead Channel Setting Sensing Sensitivity: 0.7 mV
Pulse Gen Model: 2272
Pulse Gen Serial Number: 9004253

## 2019-02-06 LAB — BASIC METABOLIC PANEL
BUN/Creatinine Ratio: 26 (ref 12–28)
BUN: 42 mg/dL — ABNORMAL HIGH (ref 10–36)
CO2: 22 mmol/L (ref 20–29)
Calcium: 9.2 mg/dL (ref 8.7–10.3)
Chloride: 104 mmol/L (ref 96–106)
Creatinine, Ser: 1.59 mg/dL — ABNORMAL HIGH (ref 0.57–1.00)
GFR calc Af Amer: 32 mL/min/{1.73_m2} — ABNORMAL LOW (ref >59)
GFR calc non Af Amer: 28 mL/min/{1.73_m2} — ABNORMAL LOW (ref >59)
Glucose: 125 mg/dL — ABNORMAL HIGH (ref 65–99)
Potassium: 4.5 mmol/L (ref 3.5–5.2)
Sodium: 144 mmol/L (ref 134–144)

## 2019-02-07 LAB — NOVEL CORONAVIRUS, NAA (HOSP ORDER, SEND-OUT TO REF LAB; TAT 18-24 HRS): SARS-CoV-2, NAA: NOT DETECTED

## 2019-02-10 ENCOUNTER — Ambulatory Visit (HOSPITAL_COMMUNITY)
Admission: RE | Admit: 2019-02-10 | Discharge: 2019-02-10 | Disposition: A | Discharge status: Home / Self Care | Payer: Medicare Other | Attending: Cardiovascular Disease | Admitting: Cardiovascular Disease

## 2019-02-10 ENCOUNTER — Encounter (HOSPITAL_COMMUNITY)
Admission: RE | Disposition: A | Discharge status: Home / Self Care | Payer: Medicare Other | Source: Home / Self Care | Attending: Cardiovascular Disease

## 2019-02-10 ENCOUNTER — Other Ambulatory Visit: Payer: Self-pay

## 2019-02-10 ENCOUNTER — Ambulatory Visit (HOSPITAL_COMMUNITY): Payer: Medicare Other | Admitting: Certified Registered"

## 2019-02-10 ENCOUNTER — Encounter (HOSPITAL_COMMUNITY): Payer: Self-pay | Admitting: Cardiovascular Disease

## 2019-02-10 DIAGNOSIS — I495 Sick sinus syndrome: Secondary | ICD-10-CM | POA: Diagnosis not present

## 2019-02-10 DIAGNOSIS — Z950 Presence of cardiac pacemaker: Secondary | ICD-10-CM | POA: Insufficient documentation

## 2019-02-10 DIAGNOSIS — E039 Hypothyroidism, unspecified: Secondary | ICD-10-CM | POA: Insufficient documentation

## 2019-02-10 DIAGNOSIS — I130 Hypertensive heart and chronic kidney disease with heart failure and stage 1 through stage 4 chronic kidney disease, or unspecified chronic kidney disease: Secondary | ICD-10-CM | POA: Insufficient documentation

## 2019-02-10 DIAGNOSIS — Z79899 Other long term (current) drug therapy: Secondary | ICD-10-CM | POA: Insufficient documentation

## 2019-02-10 DIAGNOSIS — J45909 Unspecified asthma, uncomplicated: Secondary | ICD-10-CM | POA: Insufficient documentation

## 2019-02-10 DIAGNOSIS — N183 Chronic kidney disease, stage 3 unspecified: Secondary | ICD-10-CM | POA: Insufficient documentation

## 2019-02-10 DIAGNOSIS — I5033 Acute on chronic diastolic (congestive) heart failure: Secondary | ICD-10-CM | POA: Insufficient documentation

## 2019-02-10 DIAGNOSIS — I4819 Other persistent atrial fibrillation: Secondary | ICD-10-CM | POA: Diagnosis not present

## 2019-02-10 DIAGNOSIS — I739 Peripheral vascular disease, unspecified: Secondary | ICD-10-CM | POA: Diagnosis not present

## 2019-02-10 DIAGNOSIS — Z7901 Long term (current) use of anticoagulants: Secondary | ICD-10-CM | POA: Insufficient documentation

## 2019-02-10 DIAGNOSIS — I4891 Unspecified atrial fibrillation: Secondary | ICD-10-CM | POA: Insufficient documentation

## 2019-02-10 DIAGNOSIS — E785 Hyperlipidemia, unspecified: Secondary | ICD-10-CM | POA: Diagnosis not present

## 2019-02-10 HISTORY — PX: CARDIOVERSION: SHX1299

## 2019-02-10 SURGERY — CARDIOVERSION
Anesthesia: General

## 2019-02-10 MED ORDER — PROPOFOL 10 MG/ML IV BOLUS
INTRAVENOUS | Status: DC | PRN
  Administered 2019-02-10: 40 mg via INTRAVENOUS

## 2019-02-10 MED ORDER — LIDOCAINE HCL (CARDIAC) PF 100 MG/5ML IV SOSY
PREFILLED_SYRINGE | INTRAVENOUS | Status: DC | PRN
  Administered 2019-02-10: 30 mg via INTRAVENOUS

## 2019-02-10 MED ORDER — SODIUM CHLORIDE 0.9 % IV SOLN: INTRAVENOUS | Status: DC

## 2019-02-10 NOTE — Transfer of Care (Signed)
Immediate Anesthesia Transfer of Care Note  Patient: Holly Bolton  Procedure(s) Performed: CARDIOVERSION (N/A )  Patient Location: Endoscopy Unit  Anesthesia Type:General  Level of Consciousness: drowsy and patient cooperative  Airway & Oxygen Therapy: Patient Spontanous Breathing  Post-op Assessment: Report given to RN, Post -op Vital signs reviewed and stable and Patient moving all extremities X 4  Post vital signs: Reviewed and stable  Last Vitals:  Vitals Value Taken Time  BP 118/39 02/10/19 0945  Temp    Pulse 73 02/10/19 0945  Resp 18 02/10/19 0945  SpO2 100 % 02/10/19 0945    Last Pain:  Vitals:   02/10/19 0945  TempSrc:   PainSc: 0-No pain         Complications: No apparent anesthesia complications

## 2019-02-10 NOTE — CV Procedure (Signed)
Electrical Cardioversion Procedure Note VERLEEN STUCKEY [id number] 1922-09-07  Procedure: Electrical Cardioversion Indications:  Atrial Fibrillation  Procedure Details Consent: Risks of procedure as well as the alternatives and risks of each were explained to the (patient/caregiver).  Consent for procedure obtained. Time Out: Verified patient identification, verified procedure, site/side was marked, verified correct patient position, special equipment/implants available, medications/allergies/relevent history reviewed, required imaging and test results available.  Performed  Patient placed on cardiac monitor, pulse oximetry, supplemental oxygen as necessary.  Sedation given: propafol Pacer pads placed anterior and posterior chest.  Cardioverted 1 time(s).  Cardioverted at 120J.  Evaluation Findings: Post procedure EKG shows: AP-VP Complications: None Patient did tolerate procedure well.   Skeet Latch, MD 02/10/2019, 9:42 AM

## 2019-02-10 NOTE — Interval H&P Note (Signed)
History and Physical Interval Note:  02/10/2019 8:17 AM  Loma Boston  has presented today for surgery, with the diagnosis of A-FLUTTER.  The various methods of treatment have been discussed with the patient and family. After consideration of risks, benefits and other options for treatment, the patient has consented to  Procedure(s): CARDIOVERSION (N/A) as a surgical intervention.  The patient's history has been reviewed, patient examined, no change in status, stable for surgery.  I have reviewed the patient's chart and labs.  Questions were answered to the patient's satisfaction.     Skeet Latch, MD

## 2019-02-10 NOTE — Discharge Instructions (Signed)

## 2019-02-10 NOTE — Anesthesia Preprocedure Evaluation (Addendum)
Anesthesia Evaluation    Reviewed: Allergy & Precautions, Patient's Chart, lab work & pertinent test results, reviewed documented beta blocker date and time   History of Anesthesia Complications Negative for: history of anesthetic complications  Airway Mallampati: II  TM Distance: >3 FB Neck ROM: Full    Dental  (+) Dental Advisory Given   Pulmonary asthma ,    Pulmonary exam normal        Cardiovascular hypertension, Pt. on home beta blockers and Pt. on medications + CAD, + CABG, + Peripheral Vascular Disease and +CHF  + dysrhythmias Atrial Fibrillation + pacemaker + Valvular Problems/Murmurs (s/p AVR)  Rhythm:Irregular Rate:Normal   '20 TTE - EF 30 to 35%. Moderately increased LVH. Grade II diastolic dysfunction (pseudonormalization). LA was severely dilated. RA was mildly dilated. Mild MR. Moderate TR. Trivial AI, mild AS. Trivial PR. Mildly elevated pulmonary artery systolic pressure. A pacer wire is visualized.    Neuro/Psych negative neurological ROS  negative psych ROS   GI/Hepatic negative GI ROS, Neg liver ROS,   Endo/Other  Hypothyroidism   Renal/GU CRFRenal disease     Musculoskeletal negative musculoskeletal ROS (+)   Abdominal   Peds  Hematology  (+) anemia ,   Anesthesia Other Findings Covid neg 12/24   Reproductive/Obstetrics                            Anesthesia Physical Anesthesia Plan  ASA: III  Anesthesia Plan: General   Post-op Pain Management:    Induction: Intravenous  PONV Risk Score and Plan: 3 and Treatment may vary due to age or medical condition and Propofol infusion  Airway Management Planned: Mask and Natural Airway  Additional Equipment: None  Intra-op Plan:   Post-operative Plan:   Informed Consent: I have reviewed the patients History and Physical, chart, labs and discussed the procedure including the risks, benefits and alternatives for the  proposed anesthesia with the patient or authorized representative who has indicated his/her understanding and acceptance.       Plan Discussed with: CRNA and Anesthesiologist  Anesthesia Plan Comments:        Anesthesia Quick Evaluation

## 2019-02-10 NOTE — Anesthesia Postprocedure Evaluation (Signed)
Anesthesia Post Note  Patient: Holly Bolton  Procedure(s) Performed: CARDIOVERSION (N/A )     Patient location during evaluation: PACU Anesthesia Type: General Level of consciousness: awake and alert Pain management: pain level controlled Vital Signs Assessment: post-procedure vital signs reviewed and stable Respiratory status: spontaneous breathing, nonlabored ventilation and respiratory function stable Cardiovascular status: blood pressure returned to baseline and stable Postop Assessment: no apparent nausea or vomiting Anesthetic complications: no    Last Vitals:  Vitals:   02/10/19 1000 02/10/19 1010  BP: (!) 101/37 (!) 118/40  Pulse: (!) 59 63  Resp: (!) 21 18  Temp:    SpO2: 97% 98%    Last Pain:  Vitals:   02/10/19 1010  TempSrc:   PainSc: 0-No pain                 Audry Pili

## 2019-02-11 DIAGNOSIS — G3184 Mild cognitive impairment, so stated: Secondary | ICD-10-CM | POA: Diagnosis not present

## 2019-02-11 DIAGNOSIS — I2510 Atherosclerotic heart disease of native coronary artery without angina pectoris: Secondary | ICD-10-CM | POA: Diagnosis not present

## 2019-02-11 DIAGNOSIS — I130 Hypertensive heart and chronic kidney disease with heart failure and stage 1 through stage 4 chronic kidney disease, or unspecified chronic kidney disease: Secondary | ICD-10-CM | POA: Diagnosis not present

## 2019-02-11 DIAGNOSIS — J4520 Mild intermittent asthma, uncomplicated: Secondary | ICD-10-CM | POA: Diagnosis not present

## 2019-02-11 DIAGNOSIS — N183 Chronic kidney disease, stage 3 unspecified: Secondary | ICD-10-CM | POA: Diagnosis not present

## 2019-02-11 DIAGNOSIS — I5033 Acute on chronic diastolic (congestive) heart failure: Secondary | ICD-10-CM | POA: Diagnosis not present

## 2019-02-12 DIAGNOSIS — I130 Hypertensive heart and chronic kidney disease with heart failure and stage 1 through stage 4 chronic kidney disease, or unspecified chronic kidney disease: Secondary | ICD-10-CM | POA: Diagnosis not present

## 2019-02-12 DIAGNOSIS — G3184 Mild cognitive impairment, so stated: Secondary | ICD-10-CM | POA: Diagnosis not present

## 2019-02-12 DIAGNOSIS — I2510 Atherosclerotic heart disease of native coronary artery without angina pectoris: Secondary | ICD-10-CM | POA: Diagnosis not present

## 2019-02-12 DIAGNOSIS — J4520 Mild intermittent asthma, uncomplicated: Secondary | ICD-10-CM | POA: Diagnosis not present

## 2019-02-12 DIAGNOSIS — N183 Chronic kidney disease, stage 3 unspecified: Secondary | ICD-10-CM | POA: Diagnosis not present

## 2019-02-12 DIAGNOSIS — I5033 Acute on chronic diastolic (congestive) heart failure: Secondary | ICD-10-CM | POA: Diagnosis not present

## 2019-02-13 ENCOUNTER — Other Ambulatory Visit: Payer: Self-pay

## 2019-02-13 ENCOUNTER — Encounter: Payer: Self-pay | Admitting: Physician Assistant

## 2019-02-13 ENCOUNTER — Ambulatory Visit (INDEPENDENT_AMBULATORY_CARE_PROVIDER_SITE_OTHER): Payer: Medicare Other | Admitting: Physician Assistant

## 2019-02-13 VITALS — BP 162/99 | HR 82 | Temp 96.4°F | Ht 62.0 in | Wt 90.2 lb

## 2019-02-13 DIAGNOSIS — Z950 Presence of cardiac pacemaker: Secondary | ICD-10-CM

## 2019-02-13 DIAGNOSIS — Z905 Acquired absence of kidney: Secondary | ICD-10-CM | POA: Diagnosis not present

## 2019-02-13 DIAGNOSIS — Z7982 Long term (current) use of aspirin: Secondary | ICD-10-CM | POA: Diagnosis not present

## 2019-02-13 DIAGNOSIS — I745 Embolism and thrombosis of iliac artery: Secondary | ICD-10-CM

## 2019-02-13 DIAGNOSIS — Z9889 Other specified postprocedural states: Secondary | ICD-10-CM | POA: Diagnosis not present

## 2019-02-13 DIAGNOSIS — J4520 Mild intermittent asthma, uncomplicated: Secondary | ICD-10-CM | POA: Diagnosis not present

## 2019-02-13 DIAGNOSIS — E440 Moderate protein-calorie malnutrition: Secondary | ICD-10-CM | POA: Diagnosis not present

## 2019-02-13 DIAGNOSIS — I5042 Chronic combined systolic (congestive) and diastolic (congestive) heart failure: Secondary | ICD-10-CM | POA: Diagnosis not present

## 2019-02-13 DIAGNOSIS — Z951 Presence of aortocoronary bypass graft: Secondary | ICD-10-CM | POA: Diagnosis not present

## 2019-02-13 DIAGNOSIS — I130 Hypertensive heart and chronic kidney disease with heart failure and stage 1 through stage 4 chronic kidney disease, or unspecified chronic kidney disease: Secondary | ICD-10-CM | POA: Diagnosis not present

## 2019-02-13 DIAGNOSIS — E785 Hyperlipidemia, unspecified: Secondary | ICD-10-CM

## 2019-02-13 DIAGNOSIS — I739 Peripheral vascular disease, unspecified: Secondary | ICD-10-CM

## 2019-02-13 DIAGNOSIS — I5033 Acute on chronic diastolic (congestive) heart failure: Secondary | ICD-10-CM | POA: Diagnosis not present

## 2019-02-13 DIAGNOSIS — I495 Sick sinus syndrome: Secondary | ICD-10-CM

## 2019-02-13 DIAGNOSIS — Z953 Presence of xenogenic heart valve: Secondary | ICD-10-CM | POA: Diagnosis not present

## 2019-02-13 DIAGNOSIS — I441 Atrioventricular block, second degree: Secondary | ICD-10-CM

## 2019-02-13 DIAGNOSIS — I480 Paroxysmal atrial fibrillation: Secondary | ICD-10-CM

## 2019-02-13 DIAGNOSIS — E039 Hypothyroidism, unspecified: Secondary | ICD-10-CM

## 2019-02-13 DIAGNOSIS — I6523 Occlusion and stenosis of bilateral carotid arteries: Secondary | ICD-10-CM | POA: Diagnosis not present

## 2019-02-13 DIAGNOSIS — I10 Essential (primary) hypertension: Secondary | ICD-10-CM

## 2019-02-13 DIAGNOSIS — I2510 Atherosclerotic heart disease of native coronary artery without angina pectoris: Secondary | ICD-10-CM | POA: Diagnosis not present

## 2019-02-13 DIAGNOSIS — Z8679 Personal history of other diseases of the circulatory system: Secondary | ICD-10-CM

## 2019-02-13 DIAGNOSIS — G3184 Mild cognitive impairment, so stated: Secondary | ICD-10-CM

## 2019-02-13 DIAGNOSIS — N183 Chronic kidney disease, stage 3 unspecified: Secondary | ICD-10-CM | POA: Diagnosis not present

## 2019-02-13 DIAGNOSIS — Z8781 Personal history of (healed) traumatic fracture: Secondary | ICD-10-CM | POA: Diagnosis not present

## 2019-02-13 NOTE — Progress Notes (Signed)
Cardiology Office Note:    Date:  02/15/2019   ID:  Holly Bolton, DOB [birth year], MRN [id number]  PCP:  Gayland Curry, DO  Cardiologist:  Sanda Klein, MD  Electrophysiologist:  None   Referring MD: Gayland Curry, DO   Chief Complaint  Patient presents with  . Follow-up    seen for Dr. Sallyanne Kuster    History of Present Illness:    Holly Bolton is a 85 y.o. female with a hx of PAD, CAD s/p CABG 1999 with LIMA-LAD, history of severe bicuspid aortic stenosis s/p AVR, ascending aortic aneurysm repair, SSS s/p pacemaker, hypertension, hyperlipidemia, PAF and hypothyroidism.  She had pacemaker generator change out in April 2019.  Patient was admitted with bilateral lower extremity edema in August.  Chest x-ray showed bilateral pleural effusion.  This improved with diuretics during the hospitalization. Echo obtained during the hospitalization showed EF 55 to 60%, prosthetic valve noted, otherwise no significant abnormality.  She was diagnosed with paroxysmal atrial fibrillation on 01/13/2019 and was started on Eliquis.  Patient returned to the hospital on 01/22/2019 with orthopnea, edema and weight gain.  BNP was elevated at 1716.  She was treated with IV Lasix and was eventually discharged on 40 mg daily of Lasix.  Discharge weight was 87 pounds.  Echocardiogram obtained during this admission showed drop of EF down to 35%.  She underwent successful cardioversion on 02/10/2019.  Patient presents today along with her son.  She does have trace amount of lower extremity edema.  Her current weight is 90 pounds.  Since recent discharge, her PCP has instructed her to decrease Lasix to 20 mg daily due to fear of dehydration.  Her son also mention she was quite weak on the 40 mg daily of Lasix.  I am fine with continuing on the 20 mg daily of Lasix, her son is aware that if her weight increase by more than 5 pounds off of her baseline of 87 pounds dry weight, she may take extra 20 mg Lasix as needed.   Otherwise EKG today showed paced rhythm.  There are 2 EKG that was obtained consecutively.  Morphology of V1 V2 has changed.  I suspect EKG #1 was paced rhythm and the EKG #2 could be atrial paced to reflect intrinsic morphology.  This will need to be reviewed with Dr. Sallyanne Kuster.  Otherwise she is doing quite well   Past Medical History:  Diagnosis Date  . Aneurysm (arteriovenous) of coronary vessels    ascending aorta requiring bypass of the LAD  . Carotid stenosis 04/28/08   Doppler: <40% stenosis bilateral  . Chronic kidney disease, stage 3   . Hyperlipemia   . Hypertension   . Hypertensive chronic kidney disease   . Hypothyroidism, unspecified 02/13/2018   Symptoms well controlled with current therapy  . Mild cognitive impairment, so stated 03/28/2018   Symptoms poorly controlled, needs frequent adjustments in treatment and dose monitoring  . Mild intermittent asthma    Symptoms controlled with difficulty, affecting daily functioning, needs ongoing monitoring  . Occlusion and stenosis of bilateral carotid arteries   . Pacemaker generator end of life 11/18/08   Intermittent high-grade atrioventricular block  . Peripheral arterial disease (HCC)    left ABI of 0.78  . Presence of permanent cardiac pacemaker 08/26/01   Sinus node dysfunction-[place]  . Presence of xenogenic heart valve   . PVD (peripheral vascular disease) ([place])   . SSS (sick sinus syndrome) ([place])   . Status post  ascending aortic aneurysm repair/AVR -  Medtronic Freestyle root 09/28/2014   ascending aorta requiring bypass of the LAD     Past Surgical History:  Procedure Laterality Date  . ABDOMINAL HYSTERECTOMY  1966  . APPENDECTOMY  1943  . ASCENDING AORTIC ANEURYSM REPAIR  09/22/97   porcine aortic root  . BREAST FIBROADENOMA SURGERY  11/89  . CARDIAC SURGERY  09/22/1997   aortic valve root repair with porcine at time of CABG  . CARDIOVERSION N/A 02/10/2019   Procedure: CARDIOVERSION;  Surgeon: Skeet Latch, MD;  Location: [facility];  Service: Cardiovascular;  Laterality: N/A;  . CORONARY ARTERY BYPASS GRAFT  09/22/97   LIMA to the LAD  . I & D EXTREMITY Left 08/15/2017   Procedure: IRRIGATION AND DEBRIDEMENT EXTREMITY;  Surgeon: Nicholes Stairs, MD;  Location: [facility];  Service: Orthopedics;  Laterality: Left;  . [place]   left  . NEPHRECTOMY  1958   right  . ORIF HUMERUS FRACTURE Left 08/15/2017   Procedure: OPEN REDUCTION INTERNAL FIXATION (ORIF) PROXIMAL HUMERUS FRACTURE;  Surgeon: Nicholes Stairs, MD;  Location: [facility];  Service: Orthopedics;  Laterality: Left;  . OVARIAN CYST SURGERY  1948  . PACEMAKER GENERATOR CHANGE  11/18/08   [place]  . PACEMAKER INSERTION  08/26/01   [place]  . PPM GENERATOR CHANGEOUT N/A 05/16/2017   Procedure: PPM GENERATOR CHANGEOUT;  Surgeon: Sanda Klein, MD;  Location: [facility];  Service: Cardiovascular;  Laterality: N/A;    Current Medications: Current Meds  Medication Sig  . acetaminophen (TYLENOL) 500 MG tablet Take 500 mg by mouth every 6 (six) hours as needed for mild pain or headache.  . albuterol (ACCUNEB) 0.63 MG/3ML nebulizer solution Take 1 ampule by nebulization every 6 (six) hours as needed for wheezing.  Marland Kitchen apixaban (ELIQUIS) 2.5 MG TABS tablet Take 1 tablet (2.5 mg total) by mouth 2 (two) times daily.  . brimonidine (ALPHAGAN) 0.2 % ophthalmic solution Place 1 drop into both eyes 2 (two) times daily.   . carvedilol (COREG) 3.125 MG tablet Take 1 tablet (3.125 mg total) by mouth 2 (two) times daily with a meal.  . Cholecalciferol (VITAMIN D) 50 MCG (2000 UT) CAPS Take 1 capsule by mouth daily.  . Coenzyme Q10 (COQ10) 200 MG CAPS Take 200 mg by mouth daily.  . furosemide (LASIX) 40 MG tablet Take 1 tablet (40 mg total) by mouth daily.  . methimazole (TAPAZOLE) 5 MG tablet Take 1 tablet (5 mg total) by mouth daily.  . vitamin B-12 (CYANOCOBALAMIN) 500 MCG tablet Take 500 mcg by mouth daily.      Allergies:   Accupril [quinapril hcl], Amlodipine, Benadryl [diphenhydramine hcl], Biaxin [clarithromycin], Ciprofloxacin, Codeine, Diovan [valsartan], Medrol [methylprednisolone], Morphine and related, Neurontin [gabapentin], and Penicillins   Social History   Socioeconomic History  . Marital status: Widowed    Spouse name: Not on file  . Number of children: 1  . Years of education: 84  . Highest education level: High school graduate  Occupational History  . 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 Smoking status: Never Smoker  . Smokeless tobacco: Never Used  Substance and Sexual Activity  . Alcohol use: No  . Drug use: No  . Sexual activity: Not Currently  Other Topics Concern  . Not on file  Social History Narrative   Lives with son, Algis Greenhouse   Caffeine use: 1 cup coffee per day   Right  handed   Exercises at home with theraband, stationary bike and walking   Social Determinants of Health   Financial Resource Strain:   . Difficulty of Paying Living Expenses: Not on file  Food Insecurity:   . Worried About Charity fundraiser in the Last Year: Not on file  . Ran Out of Food in the Last Year: Not on file  Transportation Needs:   . Lack of Transportation (Medical): Not on file  . Lack of Transportation (Non-Medical): Not on file  Physical Activity:   . Days of Exercise per Week: Not on file  . Minutes of Exercise per Session: Not on file  Stress:   . Feeling of Stress : Not on file  Social Connections:   . Frequency of Communication with Friends and Family: Not on file  . Frequency of Social Gatherings with Friends and Family: Not on file  . Attends Religious Services: Not on file  . Active Member of Clubs or Organizations: Not on file  . Attends Archivist Meetings: Not on file  . Marital Status: Not on file     Family History: The patient's family history includes Heart attack in her brother, father, and mother;  Hyperlipidemia in her sister; Hypertension in her sister.  ROS:   Please see the history of present illness.     All other systems reviewed and are negative.  EKGs/Labs/Other Studies Reviewed:    The following studies were reviewed today:  Echo 01/24/2019 IMPRESSIONS    1. Left ventricular ejection fraction, by visual estimation, is 30 to 35%. The left ventricle has normal function. There is moderately increased left ventricular hypertrophy.  2. Left ventricular diastolic parameters are consistent with Grade II diastolic dysfunction (pseudonormalization).  3. The left ventricle has no regional wall motion abnormalities.  4. Global right ventricle has normal systolic function.The right ventricular size is normal. No increase in right ventricular wall thickness.  5. Left atrial size was severely dilated.  6. Right atrial size was mildly dilated.  7. The mitral valve is grossly normal. Mild mitral valve regurgitation.  8. The tricuspid valve is grossly normal. Tricuspid valve regurgitation moderate.  9. The aortic valve is grossly normal. Aortic valve regurgitation is trivial. Mild aortic valve stenosis. 10. The pulmonic valve was grossly normal. Pulmonic valve regurgitation is trivial. 11. Mildly elevated pulmonary artery systolic pressure. 12. A pacer wire is visualized. 13. The atrial septum is grossly normal.  EKG:  EKG is ordered today.  EKG #1 showed ventricularly paced rhythm.  EKG #2 showed either sinus rhythm versus junctional rhythm, heart rate 66, right bundle branch block.  Recent Labs: 01/22/2019: B Natriuretic Peptide 1,716.7 01/23/2019: ALT 14 01/24/2019: Magnesium 2.0; TSH 7.923 02/05/2019: BUN 42; Creatinine, Ser 1.59; Hemoglobin 11.4; Platelets 224; Potassium 4.5; Sodium 144  Recent Lipid Panel    Component Value Date/Time   CHOL 153 09/13/2017 0000   TRIG 62 09/13/2017 0000   HDL 49 09/13/2017 0000   LDLCALC 92 09/13/2017 0000    Physical Exam:    VS:   BP (!) 162/99   Pulse 82   Temp (!) 96.4 F (35.8 C)   Ht 5\' 2"  (1.575 m)   Wt 90 lb 3.2 oz (40.9 kg)   SpO2 96%   BMI 16.50 kg/m     Wt Readings from Last 3 Encounters:  02/13/19 90 lb 3.2 oz (40.9 kg)  01/30/19 89 lb 14.4 oz (40.8 kg)  01/24/19 84 lb 3.5 oz (38.2 kg)  GEN:  Well nourished, well developed in no acute distress HEENT: Normal NECK: No JVD; No carotid bruits LYMPHATICS: No lymphadenopathy CARDIAC: RRR, no murmurs, rubs, gallops RESPIRATORY:  Clear to auscultation without rales, wheezing or rhonchi  ABDOMEN: Soft, non-tender, non-distended MUSCULOSKELETAL:  No edema; No deformity  SKIN: Warm and dry NEUROLOGIC:  Alert and oriented x 3 PSYCHIATRIC:  Normal affect   ASSESSMENT:    1. Chronic combined systolic and diastolic heart failure ([place])   2. Essential hypertension   3. Status post ascending aortic aneurysm repair/AVR -  Medtronic Freestyle root   4. S/P ascending aortic aneurysm repair   5. Pacemaker   6. Hyperlipidemia, unspecified hyperlipidemia type   7. PAF (paroxysmal atrial fibrillation) ([place])   8. Hypothyroidism, unspecified type    PLAN:    In order of problems listed above:  1. Chronic combined systolic and diastolic heart failure: Recent echocardiogram showed drop of EF down to 30 to 35%.  This was in the setting of atrial fibrillation, patient underwent cardioversion since.  She was initially discharged on 40 mg daily of Lasix, this dose seems to be too strong for her she became quite weak on it.  Since then, her PCP has lowered her Lasix dose down to 20 mg daily.  Her son is aware that he may give additional dose of 20 mg Lasix on as-needed basis if her weight increase by more than 5 pounds.  Otherwise, I will keep her on the 20 mg daily of Lasix given her small body size and her advanced age.  2. History of ascending aortic aneurysm repair and AVR: No acute issue  3. Hypertension: Blood pressure slightly elevated today, however this is  above her normal level.  I will hold off on changing her blood pressure medication  4. PAF: Recently underwent cardioversion.  Continue Eliquis.  Today's EKG shows she is alternating between paced rhythm and what appears to be underlying intrinsic rhythm.  Will need reviewed by Dr. Sallyanne Kuster  5. Hypothyroidism: Managed by primary care provider  6. History of pacemaker: Followed by Dr. Sallyanne Kuster.   Medication Adjustments/Labs and Tests Ordered: Current medicines are reviewed at length with the patient today.  Concerns regarding medicines are outlined above.  Orders Placed This Encounter  Procedures  . EKG 12-Lead   No orders of the defined types were placed in this encounter.   Patient Instructions  Medication Instructions:  Your physician recommends that you continue on your current medications as directed. Please refer to the Current Medication list given to you today.  If your weigh is 92lbs or above, take 1 extra 20 mg Furosemide (Lasix) tablet for one day. If your weight does not come down, please call our office. Your baseline weight is 87 lbs.  *If you need a refill on your cardiac medications before your next appointment, please call your pharmacy*   Follow-Up: At [facility], you and your health needs are our priority.  As part of our continuing mission to provide you with exceptional heart care, we have created designated Provider Care Teams.  These Care Teams include your primary Cardiologist (physician) and Advanced Practice Providers (APPs -  Physician Assistants and Nurse Practitioners) who all work together to provide you with the care you need, when you need it.  Your next appointment:   Please keep your scheduled follow-up appointment with Dr. Sallyanne Kuster on Monday, 02/24/19.       Hilbert Corrigan, [place]  02/15/2019 10:15 PM    [facility]

## 2019-02-13 NOTE — Patient Instructions (Signed)
Medication Instructions:  Your physician recommends that you continue on your current medications as directed. Please refer to the Current Medication list given to you today.  If your weigh is 92lbs or above, take 1 extra 20 mg Furosemide (Lasix) tablet for one day. If your weight does not come down, please call our office. Your baseline weight is 87 lbs.  *If you need a refill on your cardiac medications before your next appointment, please call your pharmacy*   Follow-Up: At [facility], you and your health needs are our priority.  As part of our continuing mission to provide you with exceptional heart care, we have created designated Provider Care Teams.  These Care Teams include your primary Cardiologist (physician) and Advanced Practice Providers (APPs -  Physician Assistants and Nurse Practitioners) who all work together to provide you with the care you need, when you need it.  Your next appointment:   Please keep your scheduled follow-up appointment with Dr. Sallyanne Kuster on Monday, 02/24/19.

## 2019-02-15 ENCOUNTER — Encounter: Payer: Self-pay | Admitting: Physician Assistant

## 2019-02-17 ENCOUNTER — Other Ambulatory Visit: Payer: Self-pay | Admitting: *Deleted

## 2019-02-17 MED ORDER — ALBUTEROL SULFATE 0.63 MG/3ML IN NEBU: 1.0000 | INHALATION_SOLUTION | Freq: Four times a day (QID) | RESPIRATORY_TRACT | 1 refills | Status: DC | PRN

## 2019-02-17 NOTE — Telephone Encounter (Signed)
[facility]

## 2019-02-19 DIAGNOSIS — I130 Hypertensive heart and chronic kidney disease with heart failure and stage 1 through stage 4 chronic kidney disease, or unspecified chronic kidney disease: Secondary | ICD-10-CM | POA: Diagnosis not present

## 2019-02-19 DIAGNOSIS — N183 Chronic kidney disease, stage 3 unspecified: Secondary | ICD-10-CM | POA: Diagnosis not present

## 2019-02-19 DIAGNOSIS — J4520 Mild intermittent asthma, uncomplicated: Secondary | ICD-10-CM | POA: Diagnosis not present

## 2019-02-19 DIAGNOSIS — I5033 Acute on chronic diastolic (congestive) heart failure: Secondary | ICD-10-CM | POA: Diagnosis not present

## 2019-02-19 DIAGNOSIS — I2510 Atherosclerotic heart disease of native coronary artery without angina pectoris: Secondary | ICD-10-CM | POA: Diagnosis not present

## 2019-02-19 DIAGNOSIS — G3184 Mild cognitive impairment, so stated: Secondary | ICD-10-CM | POA: Diagnosis not present

## 2019-02-20 DIAGNOSIS — J4520 Mild intermittent asthma, uncomplicated: Secondary | ICD-10-CM | POA: Diagnosis not present

## 2019-02-20 DIAGNOSIS — I2510 Atherosclerotic heart disease of native coronary artery without angina pectoris: Secondary | ICD-10-CM | POA: Diagnosis not present

## 2019-02-20 DIAGNOSIS — G3184 Mild cognitive impairment, so stated: Secondary | ICD-10-CM | POA: Diagnosis not present

## 2019-02-20 DIAGNOSIS — I130 Hypertensive heart and chronic kidney disease with heart failure and stage 1 through stage 4 chronic kidney disease, or unspecified chronic kidney disease: Secondary | ICD-10-CM | POA: Diagnosis not present

## 2019-02-20 DIAGNOSIS — N183 Chronic kidney disease, stage 3 unspecified: Secondary | ICD-10-CM | POA: Diagnosis not present

## 2019-02-20 DIAGNOSIS — I5033 Acute on chronic diastolic (congestive) heart failure: Secondary | ICD-10-CM | POA: Diagnosis not present

## 2019-02-24 ENCOUNTER — Other Ambulatory Visit: Payer: Self-pay

## 2019-02-24 ENCOUNTER — Encounter: Payer: Self-pay | Admitting: Cardiovascular Disease

## 2019-02-24 ENCOUNTER — Ambulatory Visit (INDEPENDENT_AMBULATORY_CARE_PROVIDER_SITE_OTHER): Payer: Medicare Other | Admitting: Cardiovascular Disease

## 2019-02-24 VITALS — BP 160/77 | HR 82 | Temp 97.0°F | Ht 62.0 in | Wt 91.0 lb

## 2019-02-24 DIAGNOSIS — I441 Atrioventricular block, second degree: Secondary | ICD-10-CM

## 2019-02-24 DIAGNOSIS — I5042 Chronic combined systolic (congestive) and diastolic (congestive) heart failure: Secondary | ICD-10-CM

## 2019-02-24 DIAGNOSIS — I495 Sick sinus syndrome: Secondary | ICD-10-CM

## 2019-02-24 DIAGNOSIS — N184 Chronic kidney disease, stage 4 (severe): Secondary | ICD-10-CM

## 2019-02-24 DIAGNOSIS — I10 Essential (primary) hypertension: Secondary | ICD-10-CM

## 2019-02-24 DIAGNOSIS — Z950 Presence of cardiac pacemaker: Secondary | ICD-10-CM | POA: Diagnosis not present

## 2019-02-24 DIAGNOSIS — I480 Paroxysmal atrial fibrillation: Secondary | ICD-10-CM

## 2019-02-24 DIAGNOSIS — I2510 Atherosclerotic heart disease of native coronary artery without angina pectoris: Secondary | ICD-10-CM

## 2019-02-24 DIAGNOSIS — I739 Peripheral vascular disease, unspecified: Secondary | ICD-10-CM

## 2019-02-24 DIAGNOSIS — Z8679 Personal history of other diseases of the circulatory system: Secondary | ICD-10-CM

## 2019-02-24 DIAGNOSIS — E0590 Thyrotoxicosis, unspecified without thyrotoxic crisis or storm: Secondary | ICD-10-CM

## 2019-02-24 DIAGNOSIS — Z9889 Other specified postprocedural states: Secondary | ICD-10-CM

## 2019-02-24 MED ORDER — APIXABAN 2.5 MG PO TABS: 2.5000 mg | ORAL_TABLET | Freq: Two times a day (BID) | ORAL | 11 refills | Status: DC

## 2019-02-24 NOTE — Progress Notes (Signed)
Patient ID: Holly Bolton, female   DOB: March 05, 1922, 86 y.o.   MRN: [id number]    Cardiology Office Note    Date:  02/26/2019   ID:  KIM OKI, DOB 1922-07-26, MRN [id number]  PCP:  Gayland Curry, DO  Cardiologist:   Sanda Klein, MD   Chief Complaint  Patient presents with  . Atrial Fibrillation    History of Present Illness:  Holly Bolton is a 86 y.o. female for follow up after elective cardioversion for persistent atrial fibrillation associated with heart failure exacerbation.  Additional problems include PAD, CAD status post CABG, s/p AVR & ascending aortic aneurysm repair, pacemaker for SSS and 2nd deg AV block.  She is accompanied by her son.  Her pacemaker showed persistent atrial fibrillation for about a month, before she was hospitalized on December 9 with orthopnea, edema and weight gain.  She improved after admission in hospital and treatment with diuretics and was discharged with a "dry weight" of 87 pounds on the hospital scale, 85 pounds on her home scale.  She underwent a successful cardioversion on December 28, after 3 weeks of uninterrupted anticoagulation.  She was seen back in clinic 3 days later and was doing well and remained in sinus rhythm.  Today she returns and is still doing fairly well.  EKG shows sinus rhythm.  Pacemaker interrogation shows no atrial fibrillation since the cardioversion, but she did have a 10-second episode of a little irregular paroxysmal atrial tachycardia on January 2.  She has mild bilateral calf edema.  She weighs 88 pounds on her home scale.  She does not have orthopnea, PND.  She is quite sedentary, but does have intermittent claudication in the right calf (known obstruction, medical therapy recommended).  She had a pacemaker generator change out in April 2019.  Pacemaker function is otherwise normal and generator is August at beginning of life.  She has 25% atrial pacing and 79% ventricular pacing.  The burden of atrial fibrillation has  been about 9% overall.  There was notable increase in the frequency of ventricular pacing during the prolonged episode of atrial fibrillation (from an average of 60% to virtually 100%).  She has not had problems with bleeding and has not had any serious falls.   She underwent aortic root replacement with a Medtronic Freestyle root, with coronary reimplantation and a single vessel LIMA to LAD bypass in 1999 (for bicuspid valve with severe stenosis and ascending aortic aneurysm). She received a dual-chamber permanent pacemaker for sinus node dysfunction and intermittent high-grade AV block. She had a pacemaker generator change in 2010 ([address]. [place] accent). She has hyperlipidemia but prefers not to take statin medication.  She was hospitalized in April 2020 and again in December 2020 with acute exacerbation of heart failure, in December related to persistent atrial fibrillation.  In September 2019 EF was 50-55%, in December 2028 had dropped to 30-35%.  She underwent electrical cardioversion on February 10, 2019  Most recent echo December 2020  with normal bioprosthesis function with an LVEF 30-35%.  The left atrium is severely dilated.  Past Medical History:  Diagnosis Date  . Aneurysm (arteriovenous) of coronary vessels    ascending aorta requiring bypass of the LAD  . Carotid stenosis 04/28/08   Doppler: <40% stenosis bilateral  . Chronic kidney disease, stage 3   . Hyperlipemia   . Hypertension   . Hypertensive chronic kidney disease   . Hypothyroidism, unspecified 02/13/2018   Symptoms well controlled with current therapy  .  Mild cognitive impairment, so stated 03/28/2018   Symptoms poorly controlled, needs frequent adjustments in treatment and dose monitoring  . Mild intermittent asthma    Symptoms controlled with difficulty, affecting daily functioning, needs ongoing monitoring  . Occlusion and stenosis of bilateral carotid arteries   . Pacemaker generator end of life 11/18/08   Intermittent  high-grade atrioventricular block  . Peripheral arterial disease (HCC)    left ABI of 0.78  . Presence of permanent cardiac pacemaker 08/26/01   Sinus node dysfunction-[place]  . Presence of xenogenic heart valve   . PVD (peripheral vascular disease) ([place])   . SSS (sick sinus syndrome) ([place])   . Status post ascending aortic aneurysm repair/AVR -  Medtronic Freestyle root 09/28/2014   ascending aorta requiring bypass of the LAD     Past Surgical History:  Procedure Laterality Date  . ABDOMINAL HYSTERECTOMY  1966  . APPENDECTOMY  1943  . ASCENDING AORTIC ANEURYSM REPAIR  09/22/97   porcine aortic root  . BREAST FIBROADENOMA SURGERY  11/89  . CARDIAC SURGERY  09/22/1997   aortic valve root repair with porcine at time of CABG  . CARDIOVERSION N/A 02/10/2019   Procedure: CARDIOVERSION;  Surgeon: Skeet Latch, MD;  Location: [facility];  Service: Cardiovascular;  Laterality: N/A;  . CORONARY ARTERY BYPASS GRAFT  09/22/97   LIMA to the LAD  . I & D EXTREMITY Left 08/15/2017   Procedure: IRRIGATION AND DEBRIDEMENT EXTREMITY;  Surgeon: Nicholes Stairs, MD;  Location: [facility];  Service: Orthopedics;  Laterality: Left;  . [place]   left  . NEPHRECTOMY  1958   right  . ORIF HUMERUS FRACTURE Left 08/15/2017   Procedure: OPEN REDUCTION INTERNAL FIXATION (ORIF) PROXIMAL HUMERUS FRACTURE;  Surgeon: Nicholes Stairs, MD;  Location: [place];  Service: Orthopedics;  Laterality: Left;  . OVARIAN CYST SURGERY  1948  . PACEMAKER GENERATOR CHANGE  11/18/08   [place]  . PACEMAKER INSERTION  08/26/01   [place]  . PPM GENERATOR CHANGEOUT N/A 05/16/2017   Procedure: PPM GENERATOR CHANGEOUT;  Surgeon: Sanda Klein, MD;  Location: [facility];  Service: Cardiovascular;  Laterality: N/A;    Current Medications: Outpatient Medications Prior to Visit  Medication Sig Dispense Refill  . acetaminophen (TYLENOL) 500 MG tablet Take 500 mg by mouth every 6 (six) hours as needed  for mild pain or headache.    . albuterol (ACCUNEB) 0.63 MG/3ML nebulizer solution Take 3 mLs (0.63 mg total) by nebulization every 6 (six) hours as needed for wheezing. 75 mL 1  . brimonidine (ALPHAGAN) 0.2 % ophthalmic solution Place 1 drop into both eyes 2 (two) times daily.   2  . carvedilol (COREG) 3.125 MG tablet Take 1 tablet (3.125 mg total) by mouth 2 (two) times daily with a meal. 180 tablet 1  . Cholecalciferol (VITAMIN D) 50 MCG (2000 UT) CAPS Take 1 capsule by mouth daily.    . Coenzyme Q10 (COQ10) 200 MG CAPS Take 200 mg by mouth daily.    . furosemide (LASIX) 40 MG tablet Take 1 tablet (40 mg total) by mouth daily. 30 tablet 0  . methimazole (TAPAZOLE) 5 MG tablet Take 1 tablet (5 mg total) by mouth daily. 30 tablet 1  . vitamin B-12 (CYANOCOBALAMIN) 500 MCG tablet Take 500 mcg by mouth daily.    Marland Kitchen apixaban (ELIQUIS) 2.5 MG TABS tablet Take 1 tablet (2.5 mg total) by mouth 2 (two) times daily. 60 tablet 11   No  facility-administered medications prior to visit.     Allergies:   Accupril [quinapril hcl], Amlodipine, Benadryl [diphenhydramine hcl], Biaxin [clarithromycin], Ciprofloxacin, Codeine, Diovan [valsartan], Medrol [methylprednisolone], Morphine and related, Neurontin [gabapentin], and Penicillins   Social History   Socioeconomic History  . Marital status: Widowed    Spouse name: Not on file  . Number of children: 1  . Years of education: 53  . Highest education level: High school graduate  Occupational History  . 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 Smoking status: Never Smoker  . Smokeless tobacco: Never Used  Substance and Sexual Activity  . Alcohol use: No  . Drug use: No  . Sexual activity: Not Currently  Other Topics Concern  . Not on file  Social History Narrative   Lives with son, Holly Bolton   Caffeine use: 1 cup coffee per day   Right handed   Exercises at home with theraband, stationary bike and walking   Social  Determinants of Health   Financial Resource Strain:   . Difficulty of Paying Living Expenses: Not on file  Food Insecurity:   . Worried About Charity fundraiser in the Last Year: Not on file  . Ran Out of Food in the Last Year: Not on file  Transportation Needs:   . Lack of Transportation (Medical): Not on file  . Lack of Transportation (Non-Medical): Not on file  Physical Activity:   . Days of Exercise per Week: Not on file  . Minutes of Exercise per Session: Not on file  Stress:   . Feeling of Stress : Not on file  Social Connections:   . Frequency of Communication with Friends and Family: Not on file  . Frequency of Social Gatherings with Friends and Family: Not on file  . Attends Religious Services: Not on file  . Active Member of Clubs or Organizations: Not on file  . Attends Archivist Meetings: Not on file  . Marital Status: Not on file     Family History:  The patient's family history includes Heart attack in her brother, father, and mother; Hyperlipidemia in her sister; Hypertension in her sister.   ROS:   Please see the history of present illness.    ROS All other systems are reviewed and are negative.   PHYSICAL EXAM:   VS:  BP (!) 160/77   Pulse 82   Temp (!) 97 F (36.1 C)   Ht 5\' 2"  (1.575 m)   Wt 91 lb (41.3 kg)   BMI 16.64 kg/m      General: Alert, oriented x3, no distress, appears very elderly and frail.  Healthy left subclavian pacemaker site. Head: no evidence of trauma, PERRL, EOMI, no exophtalmos or lid lag, no myxedema, no xanthelasma; normal ears, nose and oropharynx Neck: normal jugular venous pulsations and no hepatojugular reflux; brisk carotid pulses without delay and no carotid bruits Chest: clear to auscultation, no signs of consolidation by percussion or palpation, normal fremitus, symmetrical and full respiratory excursions Cardiovascular: normal position and quality of the apical impulse, regular rhythm, normal first and second  heart sounds, 2/ aortic ejection murmur, no diastolic murmurs, rubs or gallops Abdomen: no tenderness or distention, no masses by palpation, no abnormal pulsatility or arterial bruits, normal bowel sounds, no hepatosplenomegaly Extremities: no clubbing, cyanosis; symmetrical 1+ ankle and pretibial edema; 2+ radial, ulnar and brachial pulses bilaterally; 2+ right femoral, posterior tibial and dorsalis pedis pulses; 2+ left femoral, posterior tibial and  dorsalis pedis pulses; no subclavian or femoral bruits Neurological: grossly nonfocal Psych: Normal mood and affect   Wt Readings from Last 3 Encounters:  02/24/19 91 lb (41.3 kg)  02/13/19 90 lb 3.2 oz (40.9 kg)  01/30/19 89 lb 14.4 oz (40.8 kg)      Studies/Labs Reviewed:   EKG:  EKG is ordered today.  It shows sinus rhythm, bifascicular block (RBBB+ LAFB)  ECHO 08/16/2017 - Left ventricle: The cavity size was normal. Wall thickness was   increased in a pattern of moderate LVH. Systolic function was   normal. The estimated ejection fraction was in the range of 50%   to 55%. The study was not technically sufficient to allow   evaluation of LV diastolic dysfunction due to atrial   fibrillation. - Aortic valve: Moderately calcified annulus. Trileaflet;   moderately thickened leaflets. Valve area (VTI): 2.53 cm^2.  Peak gradient 9 mmHg, mean gradient 5 mmHg - Mitral valve: Mildly calcified annulus. Mildly thickened leaflets   . There was mild to moderate regurgitation. - Left atrium: The atrium was severely dilated. - Right ventricle: The cavity size was mildly dilated. - Right atrium: The atrium was moderately dilated. - Tricuspid valve: There was moderate regurgitation. - Pulmonary arteries: Systolic pressure was moderately increased.   PA peak pressure: 58 mm Hg (S). - Technically difficult study.  Echo 01/24/2019  1. Left ventricular ejection fraction, by visual estimation, is 30 to 35%. The left ventricle has normal function.  There is moderately increased left ventricular hypertrophy.  2. Left ventricular diastolic parameters are consistent with Grade II diastolic dysfunction (pseudonormalization).  3. The left ventricle has no regional wall motion abnormalities.  4. Global right ventricle has normal systolic function.The right ventricular size is normal. No increase in right ventricular wall thickness.  5. Left atrial size was severely dilated.  6. Right atrial size was mildly dilated.  7. The mitral valve is grossly normal. Mild mitral valve regurgitation.  8. The tricuspid valve is grossly normal. Tricuspid valve regurgitation moderate.  9. The aortic valve is grossly normal. Aortic valve regurgitation is trivial. Mild aortic valve stenosis. 10. The pulmonic valve was grossly normal. Pulmonic valve regurgitation is trivial. 11. Mildly elevated pulmonary artery systolic pressure. 12. A pacer wire is visualized. 13. The atrial septum is grossly normal.  Lipid Panel  Total cholesterol 170, HDL 63, LDL 99, triglycerides 57 Hemoglobin 10.1, creatinine 2.0, potassium 4.7  ASSESSMENT:    1. Chronic combined systolic and diastolic heart failure ([place])   2. Second degree AV block   3. SSS (sick sinus syndrome) ([place])   4. Pacemaker   5. Essential hypertension   6. Coronary artery disease involving native coronary artery of native heart without angina pectoris   7. Status post ascending aortic aneurysm repair/AVR -  Medtronic Freestyle root   8. PAD (peripheral artery disease) ([place])   9. Hyperthyroidism   10. CKD (chronic kidney disease) stage 4, GFR 15-29 ml/min (HCC)      PLAN:  In order of problems listed above:   1. CHF: Ejection fraction dropped precipitously on the most recent echo.  I am not sure whether this was entirely due to atrial fibrillation, dyssynchrony related to increased RV pacing or possibly undiagnosed coronary insufficiency (she does not want to have invasive procedures).  She appears to  be clinically improved following cardioversion.  She has had serious problems with orthostatic hypotension.  She is on a low-dose of carvedilol and a low-dose of loop diuretic, but  I do not think she would tolerate RAAS inhibitors. 2. 2nd deg AV block: Increase frequency of ventricular pacing, particularly during atrial fibrillation, might have contributed to heart failure exacerbation.  She has evidence of infrahisian disease with bifascicular block 3. SSS: Relatively low percentage of atrial pacing.  At 1 point there was evidence of hyperthyroidism, but her last 3 TSH assays have all actually show an elevated TSH and low-normal free T4 4. PPM: Normal device function.  The burden of ventricular pacing seems to be dropping now that she is out of atrial fibrillation.  Has some relatively low sensing on both the atrial and the ventricular channel (leads from 2003), but there is no evidence of this is interfering with normal device function so far. 5. HTN: Targeting a blood pressure of systolic under 366, due to orthostatic hypotension.  6. CAD: She does not have angina pectoris.  She had coronary reimplantation of the time of aortic valve/aortic root surgery.  She declines to take a statin.  Most recent LDL cholesterol was 92 7. S/P Asc Ao repair and bioAVR (Medtronic Freestyle): Echo December 2020 withnormal aortic valve bioprosthesis parameters 8. PAD: Asymptomatic, but sedentary.  She saw Dr. Alvester Chou.  Most of her disease was infrapopliteal and medical therapy was recommended especially since she had a moderate renal insufficiency. 9. Hyperthyroidism: On methimazole, with normal (actually low normal) free T4 and slightly elevated TSH.  No clinical symptoms of either thyrotoxicosis or hyperthyroidism, other than the episode of atrial fibrillation. 10. CKD 4: GFR around 25-30.  Baseline creatinine roughly 1.6.    Medication Adjustments/Labs and Tests Ordered: Current medicines are reviewed at length with  the patient today.  Concerns regarding medicines are outlined above.  Medication changes, Labs and Tests ordered today are listed in the Patient Instructions below. Patient Instructions  Medication Instructions:  No changes *If you need a refill on your cardiac medications before your next appointment, please call your pharmacy*  Lab Work: None ordered If you have labs (blood work) drawn today and your tests are completely normal, you will receive your results only by: Marland Kitchen MyChart Message (if you have MyChart) OR . A paper copy in the mail If you have any lab test that is abnormal or we need to change your treatment, we will call you to review the results.  Testing/Procedures: None ordered  Follow-Up: At [facility], you and your health needs are our priority.  As part of our continuing mission to provide you with exceptional heart care, we have created designated Provider Care Teams.  These Care Teams include your primary Cardiologist (physician) and Advanced Practice Providers (APPs -  Physician Assistants and Nurse Practitioners) who all work together to provide you with the care you need, when you need it.  Your next appointment:   3 month(s)  The format for your next appointment:   In Person  Provider:   Sanda Klein, MD  Other Instructions Please do a download on Monday 03/03/2019-thank you     Signed, Sanda Klein, MD  02/26/2019 5:52 PM    [address], [place], [place]  [id number] Phone: [phone]; Fax: [phone]

## 2019-02-24 NOTE — Patient Instructions (Signed)
Medication Instructions:  No changes *If you need a refill on your cardiac medications before your next appointment, please call your pharmacy*  Lab Work: None ordered If you have labs (blood work) drawn today and your tests are completely normal, you will receive your results only by: Marland Kitchen MyChart Message (if you have MyChart) OR . A paper copy in the mail If you have any lab test that is abnormal or we need to change your treatment, we will call you to review the results.  Testing/Procedures: None ordered  Follow-Up: At [facility], you and your health needs are our priority.  As part of our continuing mission to provide you with exceptional heart care, we have created designated Provider Care Teams.  These Care Teams include your primary Cardiologist (physician) and Advanced Practice Providers (APPs -  Physician Assistants and Nurse Practitioners) who all work together to provide you with the care you need, when you need it.  Your next appointment:   3 month(s)  The format for your next appointment:   In Person  Provider:   Sanda Klein, MD  Other Instructions Please do a download on Monday 03/03/2019-thank you

## 2019-02-25 DIAGNOSIS — N183 Chronic kidney disease, stage 3 unspecified: Secondary | ICD-10-CM | POA: Diagnosis not present

## 2019-02-25 DIAGNOSIS — J4520 Mild intermittent asthma, uncomplicated: Secondary | ICD-10-CM | POA: Diagnosis not present

## 2019-02-25 DIAGNOSIS — I130 Hypertensive heart and chronic kidney disease with heart failure and stage 1 through stage 4 chronic kidney disease, or unspecified chronic kidney disease: Secondary | ICD-10-CM | POA: Diagnosis not present

## 2019-02-25 DIAGNOSIS — I5033 Acute on chronic diastolic (congestive) heart failure: Secondary | ICD-10-CM | POA: Diagnosis not present

## 2019-02-25 DIAGNOSIS — I2510 Atherosclerotic heart disease of native coronary artery without angina pectoris: Secondary | ICD-10-CM | POA: Diagnosis not present

## 2019-02-25 DIAGNOSIS — G3184 Mild cognitive impairment, so stated: Secondary | ICD-10-CM | POA: Diagnosis not present

## 2019-02-26 ENCOUNTER — Encounter: Payer: Self-pay | Admitting: Cardiovascular Disease

## 2019-02-27 ENCOUNTER — Ambulatory Visit (INDEPENDENT_AMBULATORY_CARE_PROVIDER_SITE_OTHER): Payer: Medicare Other | Admitting: Internal Medicine

## 2019-02-27 ENCOUNTER — Other Ambulatory Visit: Payer: Self-pay

## 2019-02-27 ENCOUNTER — Encounter: Payer: Self-pay | Admitting: Internal Medicine

## 2019-02-27 VITALS — BP 158/68 | HR 69 | Temp 97.1°F | Ht 62.0 in | Wt 91.1 lb

## 2019-02-27 DIAGNOSIS — I4891 Unspecified atrial fibrillation: Secondary | ICD-10-CM

## 2019-02-27 DIAGNOSIS — N183 Chronic kidney disease, stage 3 unspecified: Secondary | ICD-10-CM | POA: Diagnosis not present

## 2019-02-27 DIAGNOSIS — Z7189 Other specified counseling: Secondary | ICD-10-CM | POA: Diagnosis not present

## 2019-02-27 DIAGNOSIS — I5033 Acute on chronic diastolic (congestive) heart failure: Secondary | ICD-10-CM | POA: Diagnosis not present

## 2019-02-27 DIAGNOSIS — I2510 Atherosclerotic heart disease of native coronary artery without angina pectoris: Secondary | ICD-10-CM | POA: Diagnosis not present

## 2019-02-27 DIAGNOSIS — N184 Chronic kidney disease, stage 4 (severe): Secondary | ICD-10-CM | POA: Diagnosis not present

## 2019-02-27 DIAGNOSIS — G3184 Mild cognitive impairment, so stated: Secondary | ICD-10-CM | POA: Diagnosis not present

## 2019-02-27 DIAGNOSIS — J4520 Mild intermittent asthma, uncomplicated: Secondary | ICD-10-CM | POA: Diagnosis not present

## 2019-02-27 DIAGNOSIS — I5042 Chronic combined systolic (congestive) and diastolic (congestive) heart failure: Secondary | ICD-10-CM

## 2019-02-27 DIAGNOSIS — R54 Age-related physical debility: Secondary | ICD-10-CM

## 2019-02-27 DIAGNOSIS — K5904 Chronic idiopathic constipation: Secondary | ICD-10-CM | POA: Diagnosis not present

## 2019-02-27 DIAGNOSIS — R413 Other amnesia: Secondary | ICD-10-CM | POA: Diagnosis not present

## 2019-02-27 DIAGNOSIS — I130 Hypertensive heart and chronic kidney disease with heart failure and stage 1 through stage 4 chronic kidney disease, or unspecified chronic kidney disease: Secondary | ICD-10-CM | POA: Diagnosis not present

## 2019-02-27 MED ORDER — DOCUSATE SODIUM 100 MG PO CAPS: 100.0000 mg | ORAL_CAPSULE | Freq: Every day | ORAL | 0 refills | Status: DC

## 2019-02-27 NOTE — Patient Instructions (Addendum)
COVID-19 Vaccine Information can be found at: ShippingScam.co.uk For questions related to vaccine distribution or appointments, please email [email or web address] or call [phone].   You may try colace 1 tablet nightly.  If that's not enough and stools are still hard, you may take 2 instead.  You may also try using it in the morning instead of at night if that works better for your schedule.  I recommend you consider the idea of Do Not Resuscitate because I do not think you will do well if we do CPR on you if you have your heart stop.  It may be painful and your quality of life is unlikely to be good afterward.

## 2019-02-27 NOTE — Progress Notes (Signed)
Location:  [facility] clinic  Provider: Dr. Hollace Kinnier  Goals of Care:  Advanced Directives 02/10/2019  Does Patient Have a Medical Advance Directive? No  Type of Advance Directive -  Does patient want to make changes to medical advance directive? -  Would patient like information on creating a medical advance directive? No - Patient declined     No chief complaint on file.   HPI: Patient is a 86 y.o. female seen today for medical management of chronic diseases.    Son present for visit.   She states she feels like a failure. Her declining health upsets her. She feels all she does is go to see the doctor.   She was recently diagnosed with new onset a-fib 01/30/2019 and had cardioversion done on 02/10/2019 by Dr. Oval Linsey. Next cardiology appointment in April 2021. Today she states her breathing is slightly improved. She is also less fatigued. Denies any chest pain or palpitations.   She will weight herself daily, she varies from 88-91 lbs on most days. She is currently on 20 mg lasix daily.   Still eating three meals a day. All meals are low-sodium. Son prepares meals. Will drink Book daily.   Started PT after cardioversion. She is having PT come two times a week. She ambulates with a four-point cane. No recent falls or injuries.   Complaining about constipation that started 2 weeks. Last bowel movement today, but stool was small and hard. Son has given senna-kot once and it helped her clear her bowels. She admits to eating fruit and vegetables daily. Son asking for another invertion to help prevent further episodes.   Advanced care planning discussed last visit. She does not remember discussion with Dr. Mariea Clonts.   Asking about covid vaccine and how to sign up.       Past Medical History:  Diagnosis Date  . Aneurysm (arteriovenous) of coronary vessels    ascending aorta requiring bypass of the LAD  . Carotid stenosis 04/28/08   Doppler: <40% stenosis bilateral  . Chronic  kidney disease, stage 3   . Hyperlipemia   . Hypertension   . Hypertensive chronic kidney disease   . Hypothyroidism, unspecified 02/13/2018   Symptoms well controlled with current therapy  . Mild cognitive impairment, so stated 03/28/2018   Symptoms poorly controlled, needs frequent adjustments in treatment and dose monitoring  . Mild intermittent asthma    Symptoms controlled with difficulty, affecting daily functioning, needs ongoing monitoring  . Occlusion and stenosis of bilateral carotid arteries   . Pacemaker generator end of life 11/18/08   Intermittent high-grade atrioventricular block  . Peripheral arterial disease (HCC)    left ABI of 0.78  . Presence of permanent cardiac pacemaker 08/26/01   Sinus node dysfunction-[facility]  . Presence of xenogenic heart valve   . PVD (peripheral vascular disease) ([place])   . SSS (sick sinus syndrome) ([place])   . Status post ascending aortic aneurysm repair/AVR -  Medtronic Freestyle root 09/28/2014   ascending aorta requiring bypass of the LAD     Past Surgical History:  Procedure Laterality Date  . ABDOMINAL HYSTERECTOMY  1966  . APPENDECTOMY  1943  . ASCENDING AORTIC ANEURYSM REPAIR  09/22/97   porcine aortic root  . BREAST FIBROADENOMA SURGERY  11/89  . CARDIAC SURGERY  09/22/1997   aortic valve root repair with porcine at time of CABG  . CARDIOVERSION N/A 02/10/2019   Procedure: CARDIOVERSION;  Surgeon: Skeet Latch, MD;  Location: [place];  Service:  Cardiovascular;  Laterality: N/A;  . CORONARY ARTERY BYPASS GRAFT  09/22/97   LIMA to the LAD  . I & D EXTREMITY Left 08/15/2017   Procedure: IRRIGATION AND DEBRIDEMENT EXTREMITY;  Surgeon: Nicholes Stairs, MD;  Location: [place];  Service: Orthopedics;  Laterality: Left;  . [place]   left  . NEPHRECTOMY  1958   right  . ORIF HUMERUS FRACTURE Left 08/15/2017   Procedure: OPEN REDUCTION INTERNAL FIXATION (ORIF) PROXIMAL HUMERUS FRACTURE;  Surgeon: Nicholes Stairs, MD;  Location: [place];  Service: Orthopedics;  Laterality: Left;  . OVARIAN CYST SURGERY  1948  . PACEMAKER GENERATOR CHANGE  11/18/08   [facility]  . PACEMAKER INSERTION  08/26/01   [facility]  . PPM GENERATOR CHANGEOUT N/A 05/16/2017   Procedure: PPM GENERATOR CHANGEOUT;  Surgeon: Sanda Klein, MD;  Location: [place] [facility];  Service: Cardiovascular;  Laterality: N/A;    Allergies  Allergen Reactions  . Accupril [Quinapril Hcl] Other (See Comments)    Unknown reaction   . Amlodipine Swelling  . Benadryl [Diphenhydramine Hcl] Other (See Comments)    Unknown reaction   . Biaxin [Clarithromycin] Other (See Comments)    Unknown reaction   . Ciprofloxacin Other (See Comments)    Unknown reaction   . Codeine Other (See Comments)    Unknown reaction   . Diovan [Valsartan] Other (See Comments)    Unknown reaction   . Medrol [Methylprednisolone] Other (See Comments)    Unknown  . Morphine And Related Other (See Comments)    Unknown reaction   . Neurontin [Gabapentin] Other (See Comments)    Unknown reaction   . Penicillins     Unknown reaction  Has patient had a PCN reaction causing immediate rash, facial/tongue/throat swelling, SOB or lightheadedness with hypotension: Unknown Has patient had a PCN reaction causing severe rash involving mucus membranes or skin necrosis: Unknown Has patient had a PCN reaction that required hospitalization: Unknown Has patient had a PCN reaction occurring within the last 10 years: No If all of the above answers are "NO", then may proceed with Cephalosporin use.     Outpatient Encounter Medications as of 02/27/2019  Medication Sig  . acetaminophen (TYLENOL) 500 MG tablet Take 500 mg by mouth every 6 (six) hours as needed for mild pain or headache.  . albuterol (ACCUNEB) 0.63 MG/3ML nebulizer solution Take 3 mLs (0.63 mg total) by nebulization every 6 (six) hours as needed for wheezing.  Marland Kitchen apixaban (ELIQUIS) 2.5 MG TABS tablet Take 1  tablet (2.5 mg total) by mouth 2 (two) times daily.  . brimonidine (ALPHAGAN) 0.2 % ophthalmic solution Place 1 drop into both eyes 2 (two) times daily.   . carvedilol (COREG) 3.125 MG tablet Take 1 tablet (3.125 mg total) by mouth 2 (two) times daily with a meal.  . Cholecalciferol (VITAMIN D) 50 MCG (2000 UT) CAPS Take 1 capsule by mouth daily.  . Coenzyme Q10 (COQ10) 200 MG CAPS Take 200 mg by mouth daily.  . furosemide (LASIX) 40 MG tablet Take 1 tablet (40 mg total) by mouth daily.  . methimazole (TAPAZOLE) 5 MG tablet Take 1 tablet (5 mg total) by mouth daily.  . vitamin B-12 (CYANOCOBALAMIN) 500 MCG tablet Take 500 mcg by mouth daily.   No facility-administered encounter medications on file as of 02/27/2019.    Review of Systems:  Review of Systems  Constitutional: Negative for activity change, appetite change and fatigue.  Eyes: Negative for photophobia and  visual disturbance.  Respiratory: Positive for shortness of breath and wheezing. Negative for cough.   Cardiovascular: Positive for leg swelling. Negative for chest pain.  Gastrointestinal: Positive for constipation. Negative for abdominal pain, diarrhea and nausea.  Genitourinary: Positive for frequency.  Skin: Negative.   Neurological: Positive for weakness. Negative for dizziness and headaches.  Psychiatric/Behavioral: Positive for dysphoric mood. Negative for sleep disturbance. The patient is not nervous/anxious.     Health Maintenance  Topic Date Due  . DEXA SCAN  01/14/2020 (Originally 10/17/1989)  . TETANUS/TDAP  08/15/2027  . INFLUENZA VACCINE  Completed  . PNA vac Low Risk Adult  Completed    Physical Exam: There were no vitals filed for this visit. There is no height or weight on file to calculate BMI. Physical Exam Vitals reviewed.  Constitutional:      Appearance: Normal appearance. She is normal weight.  Cardiovascular:     Rate and Rhythm: Normal rate and regular rhythm.     Pulses: Normal pulses.      Heart sounds: Normal heart sounds. No murmur.  Pulmonary:     Effort: Pulmonary effort is normal. No respiratory distress.     Breath sounds: Normal breath sounds. No wheezing.  Abdominal:     General: Abdomen is flat.     Palpations: Abdomen is soft. There is no mass.     Tenderness: There is abdominal tenderness.     Hernia: No hernia is present.     Comments: Tenderness LLQ and RLQ  Musculoskeletal:     Right lower leg: 1+ Pitting Edema present.     Left lower leg: 2+ Pitting Edema present.  Skin:    General: Skin is warm and dry.     Capillary Refill: Capillary refill takes less than 2 seconds.  Neurological:     General: No focal deficit present.     Mental Status: She is alert. Mental status is at baseline. She is disoriented.  Psychiatric:        Mood and Affect: Mood normal. Affect is flat.        Behavior: Behavior normal.        Thought Content: Thought content normal.        Cognition and Memory: Memory is impaired.        Judgment: Judgment normal.     Labs reviewed: Basic Metabolic Panel: Recent Labs    10/11/18 2301 10/12/18 0426 10/15/18 0504 10/24/18 0909 11/19/18 1018 01/22/19 1145 01/22/19 1732 01/23/19 0251 01/24/19 0326 01/30/19 1423 02/05/19 1034  NA  --    < > 138   < > 140   < >  --    < > 143 140 144  K  --    < > 3.8   < > 4.4   < >  --    < > 3.5 4.9 4.5  CL  --    < > 98   < > 104   < >  --    < > 99 101 104  CO2  --    < > 30   < > 29   < >  --    < > 34* 26 22  GLUCOSE  --    < > 105*   < > 117*   < >  --    < > 103* 105 125*  BUN  --    < > 36*   < > 35*   < >  --    < >  37* 46* 42*  CREATININE 1.66*   < > 1.71*   < > 1.44*   < >  --    < > 1.73* 1.61* 1.59*  CALCIUM  --    < > 9.0   < > 9.3   < >  --    < > 9.0 8.8 9.2  MG 2.3  --  2.0  --   --   --  2.3  --  2.0  --   --   PHOS 4.2  --   --   --   --   --  4.2  --   --   --   --   TSH 8.881*  --   --   --  14.08*  --   --   --  7.923*  --   --    < > = values in this interval not  displayed.   Liver Function Tests: Recent Labs    10/11/18 1715 10/11/18 1715 11/19/18 1018 01/22/19 1335 01/23/19 0251  AST 22   < > 17 20 18   ALT 19   < > 10 11 14   ALKPHOS 80  --   --  75 68  BILITOT 1.3*   < > 1.2 0.7 1.1  PROT 6.9   < > 6.6 6.6 5.8*  ALBUMIN 3.8  --   --  3.7 3.4*   < > = values in this interval not displayed.   Recent Labs    10/11/18 1715  LIPASE 51   No results for input(s): AMMONIA in the last 8760 hours. CBC: Recent Labs    10/24/18 0909 10/24/18 0909 11/19/18 1018 01/22/19 1145 01/23/19 0251 01/30/19 1423 02/05/19 1035  WBC 4.4   < > 5.9   < > 6.4 6.1 5.9  NEUTROABS 2,328  --  2,985  --   --  3,996  --   HGB 9.6*   < > 10.2*   < > 11.3* 11.7 11.4  HCT 29.5*   < > 31.0*   < > 34.8* 34.7* 33.5*  MCV 101.0*   < > 101.3*   < > 101.5* 100.6* 97  PLT 220   < > 132*   < > 205 193 224   < > = values in this interval not displayed.   Lipid Panel: No results for input(s): CHOL, HDL, LDLCALC, TRIG, CHOLHDL, LDLDIRECT in the last 8760 hours. No results found for: HGBA1C  Procedures since last visit: CUP [place]  Result Date: 02/06/2019 Scheduled remote reviewed.  Normal device function.  Known Persistent AF on Eliquis. Next remote 91 days.   Assessment/Plan 1. Atrial fibrillation, unspecified type ([place]) - followed by cardiology - stable at this time, shortness of breath has improved  - continue eliquis and coreg regimen  2. Chronic renal disease, stage IV (HCC) - has one kidney - continue to avoid nephrotoxic agents like nsaids, and dose adjust renally excreted meds  3. Frailty syndrome in geriatric patient - continue home health PT  4. Memory loss, short term - continues to have issues with her short term memory - struggles to remember advanced care planning discussions - hard to distinguish early dementia versus frality  5. Chronic combined systolic and diastolic heart failure (HCC) - appears to be doing  well with 20 mg lasix and weight has been steady  - continue to follow a low sodium diet that   6. ACP (advance care planning) - concepts of CPR  and ventilation explained to patient and son - at this time, Markie cannot make a clear descision - encourage Ed and Jlyn to have more discussions about ACP  7. Chronic idiopathic constipation - suspect mild dehydration from diuretics and dietary choices cause of constipation - starts docusate sodium 100 mg daily - may try eating a few prunes daily   Labs/tests ordered:  none Next appt:  6 week follow up

## 2019-02-28 DIAGNOSIS — I5033 Acute on chronic diastolic (congestive) heart failure: Secondary | ICD-10-CM | POA: Diagnosis not present

## 2019-02-28 DIAGNOSIS — I2510 Atherosclerotic heart disease of native coronary artery without angina pectoris: Secondary | ICD-10-CM | POA: Diagnosis not present

## 2019-02-28 DIAGNOSIS — J4520 Mild intermittent asthma, uncomplicated: Secondary | ICD-10-CM | POA: Diagnosis not present

## 2019-02-28 DIAGNOSIS — I130 Hypertensive heart and chronic kidney disease with heart failure and stage 1 through stage 4 chronic kidney disease, or unspecified chronic kidney disease: Secondary | ICD-10-CM | POA: Diagnosis not present

## 2019-02-28 DIAGNOSIS — G3184 Mild cognitive impairment, so stated: Secondary | ICD-10-CM | POA: Diagnosis not present

## 2019-02-28 DIAGNOSIS — N183 Chronic kidney disease, stage 3 unspecified: Secondary | ICD-10-CM | POA: Diagnosis not present

## 2019-03-03 ENCOUNTER — Ambulatory Visit (INDEPENDENT_AMBULATORY_CARE_PROVIDER_SITE_OTHER): Payer: Medicare Other | Admitting: *Deleted

## 2019-03-03 DIAGNOSIS — I495 Sick sinus syndrome: Secondary | ICD-10-CM | POA: Diagnosis not present

## 2019-03-03 LAB — CUP PACEART REMOTE DEVICE CHECK
Battery Remaining Longevity: 118 mo
Battery Remaining Percentage: 95.5 %
Battery Voltage: 3.01 V
Brady Statistic AP VP Percent: 27 %
Brady Statistic AP VS Percent: 1 %
Brady Statistic AS VP Percent: 50 %
Brady Statistic AS VS Percent: 22 %
Brady Statistic RA Percent Paced: 25 %
Brady Statistic RV Percent Paced: 79 %
Date Time Interrogation Session: 20210118020012
Implantable Lead Implant Date: 20030714
Implantable Lead Implant Date: 20030714
Implantable Lead Location: 753859
Implantable Lead Location: 753860
Implantable Pulse Generator Implant Date: 20190403
Lead Channel Impedance Value: 340 Ohm
Lead Channel Impedance Value: 450 Ohm
Lead Channel Pacing Threshold Amplitude: 0.625 V
Lead Channel Pacing Threshold Amplitude: 0.75 V
Lead Channel Pacing Threshold Pulse Width: 0.5 ms
Lead Channel Pacing Threshold Pulse Width: 0.5 ms
Lead Channel Sensing Intrinsic Amplitude: 0.8 mV
Lead Channel Sensing Intrinsic Amplitude: 3.4 mV
Lead Channel Setting Pacing Amplitude: 1 V
Lead Channel Setting Pacing Amplitude: 1.625
Lead Channel Setting Pacing Pulse Width: 0.5 ms
Lead Channel Setting Sensing Sensitivity: 0.7 mV
Pulse Gen Model: 2272
Pulse Gen Serial Number: 9004253

## 2019-03-05 DIAGNOSIS — I5033 Acute on chronic diastolic (congestive) heart failure: Secondary | ICD-10-CM | POA: Diagnosis not present

## 2019-03-05 DIAGNOSIS — I130 Hypertensive heart and chronic kidney disease with heart failure and stage 1 through stage 4 chronic kidney disease, or unspecified chronic kidney disease: Secondary | ICD-10-CM | POA: Diagnosis not present

## 2019-03-05 DIAGNOSIS — N183 Chronic kidney disease, stage 3 unspecified: Secondary | ICD-10-CM | POA: Diagnosis not present

## 2019-03-05 DIAGNOSIS — I2510 Atherosclerotic heart disease of native coronary artery without angina pectoris: Secondary | ICD-10-CM | POA: Diagnosis not present

## 2019-03-05 DIAGNOSIS — G3184 Mild cognitive impairment, so stated: Secondary | ICD-10-CM | POA: Diagnosis not present

## 2019-03-05 DIAGNOSIS — J4520 Mild intermittent asthma, uncomplicated: Secondary | ICD-10-CM | POA: Diagnosis not present

## 2019-03-06 DIAGNOSIS — I130 Hypertensive heart and chronic kidney disease with heart failure and stage 1 through stage 4 chronic kidney disease, or unspecified chronic kidney disease: Secondary | ICD-10-CM | POA: Diagnosis not present

## 2019-03-06 DIAGNOSIS — J4520 Mild intermittent asthma, uncomplicated: Secondary | ICD-10-CM | POA: Diagnosis not present

## 2019-03-06 DIAGNOSIS — N183 Chronic kidney disease, stage 3 unspecified: Secondary | ICD-10-CM | POA: Diagnosis not present

## 2019-03-06 DIAGNOSIS — G3184 Mild cognitive impairment, so stated: Secondary | ICD-10-CM | POA: Diagnosis not present

## 2019-03-06 DIAGNOSIS — I2510 Atherosclerotic heart disease of native coronary artery without angina pectoris: Secondary | ICD-10-CM | POA: Diagnosis not present

## 2019-03-06 DIAGNOSIS — I5033 Acute on chronic diastolic (congestive) heart failure: Secondary | ICD-10-CM | POA: Diagnosis not present

## 2019-03-07 DIAGNOSIS — N183 Chronic kidney disease, stage 3 unspecified: Secondary | ICD-10-CM | POA: Diagnosis not present

## 2019-03-07 DIAGNOSIS — I2510 Atherosclerotic heart disease of native coronary artery without angina pectoris: Secondary | ICD-10-CM | POA: Diagnosis not present

## 2019-03-07 DIAGNOSIS — J4520 Mild intermittent asthma, uncomplicated: Secondary | ICD-10-CM | POA: Diagnosis not present

## 2019-03-07 DIAGNOSIS — I130 Hypertensive heart and chronic kidney disease with heart failure and stage 1 through stage 4 chronic kidney disease, or unspecified chronic kidney disease: Secondary | ICD-10-CM | POA: Diagnosis not present

## 2019-03-07 DIAGNOSIS — I5033 Acute on chronic diastolic (congestive) heart failure: Secondary | ICD-10-CM | POA: Diagnosis not present

## 2019-03-07 DIAGNOSIS — G3184 Mild cognitive impairment, so stated: Secondary | ICD-10-CM | POA: Diagnosis not present

## 2019-03-09 ENCOUNTER — Encounter: Payer: Self-pay | Admitting: Internal Medicine

## 2019-03-10 ENCOUNTER — Encounter: Payer: Self-pay | Admitting: Internal Medicine

## 2019-03-10 ENCOUNTER — Ambulatory Visit (INDEPENDENT_AMBULATORY_CARE_PROVIDER_SITE_OTHER): Payer: Medicare Other | Admitting: Internal Medicine

## 2019-03-10 ENCOUNTER — Other Ambulatory Visit: Payer: Self-pay

## 2019-03-10 VITALS — BP 154/72 | HR 73 | Temp 96.7°F | Ht 62.0 in | Wt 90.5 lb

## 2019-03-10 DIAGNOSIS — N184 Chronic kidney disease, stage 4 (severe): Secondary | ICD-10-CM | POA: Diagnosis not present

## 2019-03-10 DIAGNOSIS — I5033 Acute on chronic diastolic (congestive) heart failure: Secondary | ICD-10-CM

## 2019-03-10 DIAGNOSIS — I2510 Atherosclerotic heart disease of native coronary artery without angina pectoris: Secondary | ICD-10-CM | POA: Diagnosis not present

## 2019-03-10 DIAGNOSIS — K5904 Chronic idiopathic constipation: Secondary | ICD-10-CM

## 2019-03-10 NOTE — Patient Instructions (Signed)
We'll check on the kidneys today.  If all is good, I'll send the increased dose of lasix to [facility].

## 2019-03-10 NOTE — Progress Notes (Signed)
Location:  [facility] clinic Provider: Lawren Sexson L. Mariea Clonts, D.O., C.M.D.  Goals of Care:  Advanced Directives 03/10/2019  Does Patient Have a Medical Advance Directive? No  Type of Advance Directive -  Does patient want to make changes to medical advance directive? No - Patient declined  Would patient like information on creating a medical advance directive? -     Chief Complaint  Patient presents with  . Acute Visit    Son is requesting increace In Lasix 20 mg to 40mg  Holly Holly Bolton (son) is with her    HPI: Patient is a 86 y.o. Holly Bolton seen today for an acute visit for need for increase in lasix to 40mg .    Weight had been trending up closer to 90 when baseline at home had been in the upper 80s (88 on avg).  Here she runs a little higher.  On 1/21 she was 89, 1/22, 90, 1/23 90, 12/4 89 and 1/25 88.  Ed did give her 40mg  lasix on 1/19, 1/21, 1/23, 1/24.  She is still sob but no worse.  Edema in ankles is worse.  Fatigue is stable.  Weakness is stable.  She is frail.    Past Medical History:  Diagnosis Date  . Aneurysm (arteriovenous) of coronary vessels    ascending aorta requiring bypass of the LAD  . Carotid stenosis 04/28/08   Doppler: <40% stenosis bilateral  . Chronic kidney disease, stage 3   . Hyperlipemia   . Hypertension   . Hypertensive chronic kidney disease   . Hypothyroidism, unspecified 02/13/2018   Symptoms well controlled with current therapy  . Mild cognitive impairment, so stated 03/28/2018   Symptoms poorly controlled, needs frequent adjustments in treatment and dose monitoring  . Mild intermittent asthma    Symptoms controlled with difficulty, affecting daily functioning, needs ongoing monitoring  . Occlusion and stenosis of bilateral carotid arteries   . Pacemaker generator end of life 11/18/08   Intermittent high-grade atrioventricular block  . Peripheral arterial disease (HCC)    left ABI of 0.78  . Presence of permanent cardiac pacemaker 08/26/01   Sinus node  dysfunction-[facility]  . Presence of xenogenic heart valve   . PVD (peripheral vascular disease) ([place])   . SSS (sick sinus syndrome) ([place])   . Status post ascending aortic aneurysm repair/AVR -  Medtronic Freestyle root 09/28/2014   ascending aorta requiring bypass of the LAD     Past Surgical History:  Procedure Laterality Date  . ABDOMINAL HYSTERECTOMY  1966  . APPENDECTOMY  1943  . ASCENDING AORTIC ANEURYSM REPAIR  09/22/97   porcine aortic root  . BREAST FIBROADENOMA SURGERY  11/89  . CARDIAC SURGERY  09/22/1997   aortic valve root repair with porcine at time of CABG  . CARDIOVERSION N/A 02/10/2019   Procedure: CARDIOVERSION;  Surgeon: Skeet Latch, MD;  Location: [place];  Service: Cardiovascular;  Laterality: N/A;  . CORONARY ARTERY BYPASS GRAFT  09/22/97   LIMA to the LAD  . I & D EXTREMITY Left 08/15/2017   Procedure: IRRIGATION AND DEBRIDEMENT EXTREMITY;  Surgeon: Nicholes Stairs, MD;  Location: [place];  Service: Orthopedics;  Laterality: Left;  . [place]   left  . NEPHRECTOMY  1958   right  . ORIF HUMERUS FRACTURE Left 08/15/2017   Procedure: OPEN REDUCTION INTERNAL FIXATION (ORIF) PROXIMAL HUMERUS FRACTURE;  Surgeon: Nicholes Stairs, MD;  Location: [place];  Service: Orthopedics;  Laterality: Left;  . OVARIAN CYST SURGERY  1948  .  PACEMAKER GENERATOR CHANGE  11/18/08   [facility]  . PACEMAKER INSERTION  08/26/01   [facility]  . PPM GENERATOR CHANGEOUT N/A 05/16/2017   Procedure: PPM GENERATOR CHANGEOUT;  Surgeon: Sanda Klein, MD;  Location: [place] [facility];  Service: Cardiovascular;  Laterality: N/A;    Allergies  Allergen Reactions  . Accupril [Quinapril Hcl] Other (See Comments)    Unknown reaction   . Amlodipine Swelling  . Benadryl [Diphenhydramine Hcl] Other (See Comments)    Unknown reaction   . Biaxin [Clarithromycin] Other (See Comments)    Unknown reaction   . Ciprofloxacin Other (See Comments)    Unknown reaction   .  Codeine Other (See Comments)    Unknown reaction   . Diovan [Valsartan] Other (See Comments)    Unknown reaction   . Medrol [Methylprednisolone] Other (See Comments)    Unknown  . Morphine And Related Other (See Comments)    Unknown reaction   . Neurontin [Gabapentin] Other (See Comments)    Unknown reaction   . Penicillins     Unknown reaction  Has patient had a PCN reaction causing immediate rash, facial/tongue/throat swelling, SOB or lightheadedness with hypotension: Unknown Has patient had a PCN reaction causing severe rash involving mucus membranes or skin necrosis: Unknown Has patient had a PCN reaction that required hospitalization: Unknown Has patient had a PCN reaction occurring within the last 10 years: No If all of the above answers are "NO", then may proceed with Cephalosporin use.     Outpatient Encounter Medications as of 03/10/2019  Medication Sig  . acetaminophen (TYLENOL) 500 MG tablet Take 500 mg by mouth every 6 (six) hours as needed for mild pain or headache.  . albuterol (ACCUNEB) 0.63 MG/3ML nebulizer solution Take 3 mLs (0.63 mg total) by nebulization every 6 (six) hours as needed for wheezing.  Marland Kitchen apixaban (ELIQUIS) 2.5 MG TABS tablet Take 1 tablet (2.5 mg total) by mouth 2 (two) times daily.  . brimonidine (ALPHAGAN) 0.2 % ophthalmic solution Place 1 drop into both eyes 2 (two) times daily.   . carvedilol (COREG) 3.125 MG tablet Take 1 tablet (3.125 mg total) by mouth 2 (two) times daily with a meal.  . Cholecalciferol (VITAMIN D) 50 MCG (2000 UT) CAPS Take 1 capsule by mouth daily.  . Coenzyme Q10 (COQ10) 200 MG CAPS Take 200 mg by mouth daily.  Marland Kitchen docusate sodium (COLACE) 100 MG capsule Take 1 capsule (100 mg total) by mouth daily.  . furosemide (LASIX) 20 MG tablet Take 20 mg by mouth daily. 1 tab once a day  . methimazole (TAPAZOLE) 5 MG tablet Take 1 tablet (5 mg total) by mouth daily.  . vitamin B-12 (CYANOCOBALAMIN) 500 MCG tablet Take 500 mcg by mouth  daily.   No facility-administered encounter medications on file as of 03/10/2019.    Review of Systems:  Review of Systems  Constitutional: Positive for malaise/fatigue. Negative for chills and fever.  HENT: Negative for congestion and sore throat.   Respiratory: Positive for shortness of breath.   Cardiovascular: Positive for leg swelling. Negative for chest pain and palpitations.  Gastrointestinal: Positive for constipation. Negative for abdominal pain.  Genitourinary: Negative for dysuria.  Musculoskeletal: Negative for falls.  Neurological: Negative for dizziness and loss of consciousness.  Endo/Heme/Allergies: Bruises/bleeds easily.  Psychiatric/Behavioral: Positive for memory loss.    Health Maintenance  Topic Date Due  . DEXA SCAN  01/14/2020 (Originally 10/17/1989)  . TETANUS/TDAP  08/15/2027  . INFLUENZA VACCINE  Completed  .  PNA vac Low Risk Adult  Completed    Physical Exam: Vitals:   03/10/19 1507  BP: (!) 154/72  Pulse: Holly  Temp: (!) 96.7 F (35.9 C)  SpO2: 98%  Weight: 90 lb 8 oz (41.1 kg)  Height: 5\' 2"  (1.575 m)   Body mass index is 16.55 kg/m. Physical Exam Vitals reviewed.  Cardiovascular:     Rate and Rhythm: Normal rate and regular rhythm.  Pulmonary:     Effort: Pulmonary effort is normal.     Breath sounds: Normal breath sounds. No rales.  Abdominal:     General: Bowel sounds are normal.  Musculoskeletal:        General: Normal range of motion.     Comments: Uses cane and son for support, weak and unsteady  Skin:    General: Skin is warm and dry.     Coloration: Skin is pale.  Neurological:     General: No focal deficit present.     Mental Status: She is alert.     Comments: Short term memory loss  Psychiatric:        Mood and Affect: Mood normal.     Labs reviewed: Basic Metabolic Panel: Recent Labs    10/11/18 2301 10/12/18 0426 10/15/18 0504 10/24/18 0909 11/19/18 1018 01/22/19 1145 01/22/19 1732 01/23/19 0251  01/24/19 0326 01/30/19 1423 02/05/19 1034  NA  --    < > 138   < > 140   < >  --    < > 143 140 144  K  --    < > 3.8   < > 4.4   < >  --    < > 3.5 4.9 4.5  CL  --    < > 98   < > 104   < >  --    < > 99 101 104  CO2  --    < > 30   < > 29   < >  --    < > 34* 26 22  GLUCOSE  --    < > 105*   < > 117*   < >  --    < > 103* 105 125*  BUN  --    < > 36*   < > 35*   < >  --    < > 37* 46* 42*  CREATININE 1.66*   < > 1.71*   < > 1.44*   < >  --    < > 1.Holly* 1.61* 1.59*  CALCIUM  --    < > 9.0   < > 9.3   < >  --    < > 9.0 8.8 9.2  MG 2.3  --  2.0  --   --   --  2.3  --  2.0  --   --   PHOS 4.2  --   --   --   --   --  4.2  --   --   --   --   TSH 8.881*  --   --   --  14.08*  --   --   --  7.923*  --   --    < > = values in this interval not displayed.   Liver Function Tests: Recent Labs    10/11/18 1715 10/11/18 1715 11/19/18 1018 01/22/19 1335 01/23/19 0251  AST 22   < > 17 20 18   ALT 19   < > 10  11 14  ALKPHOS 80  --   --  75 68  BILITOT 1.3*   < > 1.2 0.7 1.1  PROT 6.9   < > 6.6 6.6 5.8*  ALBUMIN 3.8  --   --  3.7 3.4*   < > = values in this interval not displayed.   Recent Labs    10/11/18 1715  LIPASE 51   No results for input(s): AMMONIA in the last 8760 hours. CBC: Recent Labs    10/24/18 0909 10/24/18 0909 11/19/18 1018 01/22/19 1145 01/23/19 0251 01/30/19 1423 02/05/19 1035  WBC 4.4   < > 5.9   < > 6.4 6.1 5.9  NEUTROABS 2,328  --  2,985  --   --  3,996  --   HGB 9.6*   < > 10.2*   < > 11.3* 11.7 11.4  HCT 29.5*   < > 31.0*   < > 34.8* 34.7* 33.5*  MCV 101.0*   < > 101.3*   < > 101.5* 100.6* 97  PLT 220   < > 132*   < > 205 193 224   < > = values in this interval not displayed.   Lipid Panel: No results for input(s): CHOL, HDL, LDLCALC, TRIG, CHOLHDL, LDLDIRECT in the last 8760 hours. No results found for: HGBA1C  Procedures since last visit: CUP [place]  Result Date: 03/03/2019 Scheduled remote reviewed.  Normal device  function.  No AF since last remote. On Eliquis. Next remote 91 days.   Assessment/Plan 1. Acute on chronic diastolic CHF (congestive heart failure) (HCC) - weight up with 20mg  lasix daily.  Son had to give 4 extra pills this week -will plan to probably increase to the 40mg  daily previously suggested by cardiology (but when she did this regularly, she got quickly dried out and weaker so her son will need to watch for that) - I did a BMP first before formally saying they should do this -Basic metabolic panel -her son is going to ask cardiology about chf clinic which he wants more for education and support  -we've discussed her code status on several occasions and pt has not been able to commit to DNR as I've recommended at her advanced age and with her frail status and renal comorbidity  2. Chronic idiopathic constipation - cont current regimen which is ok, intake is not impressive so bms not large and her diuretics and fluid restriction don't help - Basic metabolic panel  3. Chronic renal disease, stage IV ([place]) - f/u renal function with increased lasix needs lately - Basic metabolic panel -? Needs torsemide instead   Labs/tests ordered:   Lab Orders     Basic metabolic panel  Next appt:  04/10/2019  Leylanie Woodmansee L. Aritha Huckeba, D.O. [facility] [address], [place] [address] Cell Phone (Mon-Fri 8am-5pm):  [phone] On Call:  [phone] & follow prompts after 5pm & weekends Office Phone:  [phone] Office Fax:  [phone]

## 2019-03-11 ENCOUNTER — Encounter: Payer: Self-pay | Admitting: Internal Medicine

## 2019-03-11 ENCOUNTER — Other Ambulatory Visit: Payer: Self-pay | Admitting: Internal Medicine

## 2019-03-11 ENCOUNTER — Other Ambulatory Visit: Payer: Self-pay | Admitting: *Deleted

## 2019-03-11 DIAGNOSIS — I130 Hypertensive heart and chronic kidney disease with heart failure and stage 1 through stage 4 chronic kidney disease, or unspecified chronic kidney disease: Secondary | ICD-10-CM | POA: Diagnosis not present

## 2019-03-11 DIAGNOSIS — I5042 Chronic combined systolic (congestive) and diastolic (congestive) heart failure: Secondary | ICD-10-CM

## 2019-03-11 DIAGNOSIS — I2510 Atherosclerotic heart disease of native coronary artery without angina pectoris: Secondary | ICD-10-CM | POA: Diagnosis not present

## 2019-03-11 DIAGNOSIS — I5033 Acute on chronic diastolic (congestive) heart failure: Secondary | ICD-10-CM

## 2019-03-11 DIAGNOSIS — G3184 Mild cognitive impairment, so stated: Secondary | ICD-10-CM | POA: Diagnosis not present

## 2019-03-11 DIAGNOSIS — N183 Chronic kidney disease, stage 3 unspecified: Secondary | ICD-10-CM | POA: Diagnosis not present

## 2019-03-11 DIAGNOSIS — J4520 Mild intermittent asthma, uncomplicated: Secondary | ICD-10-CM | POA: Diagnosis not present

## 2019-03-11 LAB — BASIC METABOLIC PANEL
BUN/Creatinine Ratio: 28 (calc) — ABNORMAL HIGH (ref 6–22)
BUN: 41 mg/dL — ABNORMAL HIGH (ref 7–25)
CO2: 30 mmol/L (ref 20–32)
Calcium: 9.4 mg/dL (ref 8.6–10.4)
Chloride: 102 mmol/L (ref 98–110)
Creat: 1.47 mg/dL — ABNORMAL HIGH (ref 0.60–0.88)
Glucose, Bld: 125 mg/dL (ref 65–139)
Potassium: 4.3 mmol/L (ref 3.5–5.3)
Sodium: 143 mmol/L (ref 135–146)

## 2019-03-11 MED ORDER — FUROSEMIDE 40 MG PO TABS: 40.0000 mg | ORAL_TABLET | Freq: Every day | ORAL | 1 refills | Status: DC

## 2019-03-11 NOTE — Progress Notes (Signed)
Kidney function actually looks better and electrolytes are normal which is good news.  Ok to start taking lasix 40mg  daily (rx sent to [facility]), but watch out for signs of getting volume depleted:  lethargy, increased weakness (already weak), decreased intake, increasingly dry skin and skin tenting and prolonged cap refill (pushing on fingernail and it takes a long time for redness to return to nail bed)

## 2019-03-11 NOTE — Progress Notes (Signed)
Notes resulted and copy was sent out

## 2019-03-13 ENCOUNTER — Other Ambulatory Visit: Payer: Self-pay | Admitting: *Deleted

## 2019-03-13 DIAGNOSIS — R5383 Other fatigue: Secondary | ICD-10-CM

## 2019-03-13 DIAGNOSIS — D649 Anemia, unspecified: Secondary | ICD-10-CM

## 2019-03-13 DIAGNOSIS — Z79899 Other long term (current) drug therapy: Secondary | ICD-10-CM | POA: Diagnosis not present

## 2019-03-14 ENCOUNTER — Telehealth: Payer: Self-pay | Admitting: Cardiovascular Disease

## 2019-03-14 ENCOUNTER — Telehealth: Payer: Self-pay | Admitting: *Deleted

## 2019-03-14 DIAGNOSIS — I130 Hypertensive heart and chronic kidney disease with heart failure and stage 1 through stage 4 chronic kidney disease, or unspecified chronic kidney disease: Secondary | ICD-10-CM | POA: Diagnosis not present

## 2019-03-14 DIAGNOSIS — G3184 Mild cognitive impairment, so stated: Secondary | ICD-10-CM | POA: Diagnosis not present

## 2019-03-14 DIAGNOSIS — N183 Chronic kidney disease, stage 3 unspecified: Secondary | ICD-10-CM | POA: Diagnosis not present

## 2019-03-14 DIAGNOSIS — I2510 Atherosclerotic heart disease of native coronary artery without angina pectoris: Secondary | ICD-10-CM | POA: Diagnosis not present

## 2019-03-14 DIAGNOSIS — I5033 Acute on chronic diastolic (congestive) heart failure: Secondary | ICD-10-CM | POA: Diagnosis not present

## 2019-03-14 DIAGNOSIS — J4520 Mild intermittent asthma, uncomplicated: Secondary | ICD-10-CM | POA: Diagnosis not present

## 2019-03-14 LAB — TSH: TSH: 18.3 u[IU]/mL — ABNORMAL HIGH (ref 0.450–4.500)

## 2019-03-14 LAB — CBC
Hematocrit: 33.1 % — ABNORMAL LOW (ref 34.0–46.6)
Hemoglobin: 11 g/dL — ABNORMAL LOW (ref 11.1–15.9)
MCH: 33.3 pg — ABNORMAL HIGH (ref 26.6–33.0)
MCHC: 33.2 g/dL (ref 31.5–35.7)
MCV: 100 fL — ABNORMAL HIGH (ref 79–97)
Platelets: 245 10*3/uL (ref 150–450)
RBC: 3.3 x10E6/uL — ABNORMAL LOW (ref 3.77–5.28)
RDW: 14.5 % (ref 11.7–15.4)
WBC: 5.6 10*3/uL (ref 3.4–10.8)

## 2019-03-14 LAB — BASIC METABOLIC PANEL
BUN/Creatinine Ratio: 25 (ref 12–28)
BUN: 38 mg/dL — ABNORMAL HIGH (ref 10–36)
CO2: 26 mmol/L (ref 20–29)
Calcium: 9 mg/dL (ref 8.7–10.3)
Chloride: 102 mmol/L (ref 96–106)
Creatinine, Ser: 1.53 mg/dL — ABNORMAL HIGH (ref 0.57–1.00)
GFR calc Af Amer: 33 mL/min/{1.73_m2} — ABNORMAL LOW (ref >59)
GFR calc non Af Amer: 29 mL/min/{1.73_m2} — ABNORMAL LOW (ref >59)
Glucose: 163 mg/dL — ABNORMAL HIGH (ref 65–99)
Potassium: 4.4 mmol/L (ref 3.5–5.2)
Sodium: 142 mmol/L (ref 134–144)

## 2019-03-14 NOTE — Telephone Encounter (Signed)
Attempted to reach the patient's son, per the dpr. His voicemail has not been set up yet. MyChart message will be sent as well.

## 2019-03-14 NOTE — Telephone Encounter (Signed)
-----   Message from Sanda Klein, MD sent at 03/14/2019  9:21 AM EST ----- TSH is up again substantially (meaning excessive reduction in thyroid hormone levels). This could explain fatigue. Please contact her endocrinologist (if she has one) to advise whether she should stop methimazole (in the meantime, I would at least advise cutting the methimazole in half to 2.5 mg daily). Mildly anemic, but no different than all previous labs. Routine chemistries are OK: stable, moderately abnormal kidney function.

## 2019-03-14 NOTE — Progress Notes (Signed)
Thank you.  I agree with reducing to 2.5mg  methimazole at this time.  I actually think it's time she see endocrine b/c I'm looking back and we have not been able to get her TSH normal off methimazole, with a higher dose of methimazole 5mg  po tid and with just 5mg  daily most recently.    I will put in a referral if she and [name] are agreeable.

## 2019-03-15 DIAGNOSIS — I495 Sick sinus syndrome: Secondary | ICD-10-CM | POA: Diagnosis not present

## 2019-03-15 DIAGNOSIS — Z905 Acquired absence of kidney: Secondary | ICD-10-CM | POA: Diagnosis not present

## 2019-03-15 DIAGNOSIS — Z9181 History of falling: Secondary | ICD-10-CM | POA: Diagnosis not present

## 2019-03-15 DIAGNOSIS — N183 Chronic kidney disease, stage 3 unspecified: Secondary | ICD-10-CM | POA: Diagnosis not present

## 2019-03-15 DIAGNOSIS — E440 Moderate protein-calorie malnutrition: Secondary | ICD-10-CM | POA: Diagnosis not present

## 2019-03-15 DIAGNOSIS — I130 Hypertensive heart and chronic kidney disease with heart failure and stage 1 through stage 4 chronic kidney disease, or unspecified chronic kidney disease: Secondary | ICD-10-CM | POA: Diagnosis not present

## 2019-03-15 DIAGNOSIS — I739 Peripheral vascular disease, unspecified: Secondary | ICD-10-CM | POA: Diagnosis not present

## 2019-03-15 DIAGNOSIS — Z953 Presence of xenogenic heart valve: Secondary | ICD-10-CM | POA: Diagnosis not present

## 2019-03-15 DIAGNOSIS — Z8781 Personal history of (healed) traumatic fracture: Secondary | ICD-10-CM | POA: Diagnosis not present

## 2019-03-15 DIAGNOSIS — I6523 Occlusion and stenosis of bilateral carotid arteries: Secondary | ICD-10-CM | POA: Diagnosis not present

## 2019-03-15 DIAGNOSIS — J4520 Mild intermittent asthma, uncomplicated: Secondary | ICD-10-CM | POA: Diagnosis not present

## 2019-03-15 DIAGNOSIS — Z950 Presence of cardiac pacemaker: Secondary | ICD-10-CM | POA: Diagnosis not present

## 2019-03-15 DIAGNOSIS — Z7982 Long term (current) use of aspirin: Secondary | ICD-10-CM | POA: Diagnosis not present

## 2019-03-15 DIAGNOSIS — I5033 Acute on chronic diastolic (congestive) heart failure: Secondary | ICD-10-CM | POA: Diagnosis not present

## 2019-03-15 DIAGNOSIS — E785 Hyperlipidemia, unspecified: Secondary | ICD-10-CM | POA: Diagnosis not present

## 2019-03-15 DIAGNOSIS — I441 Atrioventricular block, second degree: Secondary | ICD-10-CM | POA: Diagnosis not present

## 2019-03-15 DIAGNOSIS — I2510 Atherosclerotic heart disease of native coronary artery without angina pectoris: Secondary | ICD-10-CM | POA: Diagnosis not present

## 2019-03-15 DIAGNOSIS — E0590 Thyrotoxicosis, unspecified without thyrotoxic crisis or storm: Secondary | ICD-10-CM | POA: Diagnosis not present

## 2019-03-15 DIAGNOSIS — I4891 Unspecified atrial fibrillation: Secondary | ICD-10-CM | POA: Diagnosis not present

## 2019-03-15 DIAGNOSIS — G3184 Mild cognitive impairment, so stated: Secondary | ICD-10-CM | POA: Diagnosis not present

## 2019-03-15 DIAGNOSIS — Z951 Presence of aortocoronary bypass graft: Secondary | ICD-10-CM | POA: Diagnosis not present

## 2019-03-16 ENCOUNTER — Encounter: Payer: Self-pay | Admitting: Internal Medicine

## 2019-03-16 ENCOUNTER — Ambulatory Visit: Payer: Medicare Other

## 2019-03-17 DIAGNOSIS — G3184 Mild cognitive impairment, so stated: Secondary | ICD-10-CM | POA: Diagnosis not present

## 2019-03-17 DIAGNOSIS — J4520 Mild intermittent asthma, uncomplicated: Secondary | ICD-10-CM | POA: Diagnosis not present

## 2019-03-17 DIAGNOSIS — I130 Hypertensive heart and chronic kidney disease with heart failure and stage 1 through stage 4 chronic kidney disease, or unspecified chronic kidney disease: Secondary | ICD-10-CM | POA: Diagnosis not present

## 2019-03-17 DIAGNOSIS — N183 Chronic kidney disease, stage 3 unspecified: Secondary | ICD-10-CM | POA: Diagnosis not present

## 2019-03-17 DIAGNOSIS — I2510 Atherosclerotic heart disease of native coronary artery without angina pectoris: Secondary | ICD-10-CM | POA: Diagnosis not present

## 2019-03-17 DIAGNOSIS — I5033 Acute on chronic diastolic (congestive) heart failure: Secondary | ICD-10-CM | POA: Diagnosis not present

## 2019-03-17 NOTE — Telephone Encounter (Signed)
The patient's son has been made aware and has decreased the methimazole to 2.5 mg. See epic note with PCP for details.

## 2019-03-18 DIAGNOSIS — I130 Hypertensive heart and chronic kidney disease with heart failure and stage 1 through stage 4 chronic kidney disease, or unspecified chronic kidney disease: Secondary | ICD-10-CM | POA: Diagnosis not present

## 2019-03-18 DIAGNOSIS — I2510 Atherosclerotic heart disease of native coronary artery without angina pectoris: Secondary | ICD-10-CM | POA: Diagnosis not present

## 2019-03-18 DIAGNOSIS — N183 Chronic kidney disease, stage 3 unspecified: Secondary | ICD-10-CM | POA: Diagnosis not present

## 2019-03-18 DIAGNOSIS — G3184 Mild cognitive impairment, so stated: Secondary | ICD-10-CM | POA: Diagnosis not present

## 2019-03-18 DIAGNOSIS — I5033 Acute on chronic diastolic (congestive) heart failure: Secondary | ICD-10-CM | POA: Diagnosis not present

## 2019-03-18 DIAGNOSIS — J4520 Mild intermittent asthma, uncomplicated: Secondary | ICD-10-CM | POA: Diagnosis not present

## 2019-03-21 ENCOUNTER — Ambulatory Visit: Payer: Medicare Other

## 2019-03-22 ENCOUNTER — Other Ambulatory Visit: Payer: Self-pay | Admitting: Internal Medicine

## 2019-03-22 DIAGNOSIS — I495 Sick sinus syndrome: Secondary | ICD-10-CM

## 2019-03-22 DIAGNOSIS — I2510 Atherosclerotic heart disease of native coronary artery without angina pectoris: Secondary | ICD-10-CM

## 2019-03-23 ENCOUNTER — Ambulatory Visit: Payer: Medicare Other

## 2019-03-24 ENCOUNTER — Encounter: Payer: Self-pay | Admitting: Internal Medicine

## 2019-03-24 DIAGNOSIS — I5042 Chronic combined systolic (congestive) and diastolic (congestive) heart failure: Secondary | ICD-10-CM

## 2019-03-24 DIAGNOSIS — J4520 Mild intermittent asthma, uncomplicated: Secondary | ICD-10-CM | POA: Diagnosis not present

## 2019-03-24 DIAGNOSIS — I130 Hypertensive heart and chronic kidney disease with heart failure and stage 1 through stage 4 chronic kidney disease, or unspecified chronic kidney disease: Secondary | ICD-10-CM | POA: Diagnosis not present

## 2019-03-24 DIAGNOSIS — G3184 Mild cognitive impairment, so stated: Secondary | ICD-10-CM | POA: Diagnosis not present

## 2019-03-24 DIAGNOSIS — I2510 Atherosclerotic heart disease of native coronary artery without angina pectoris: Secondary | ICD-10-CM | POA: Diagnosis not present

## 2019-03-24 DIAGNOSIS — I5033 Acute on chronic diastolic (congestive) heart failure: Secondary | ICD-10-CM | POA: Diagnosis not present

## 2019-03-24 DIAGNOSIS — I4891 Unspecified atrial fibrillation: Secondary | ICD-10-CM

## 2019-03-24 DIAGNOSIS — E0590 Thyrotoxicosis, unspecified without thyrotoxic crisis or storm: Secondary | ICD-10-CM

## 2019-03-24 DIAGNOSIS — N183 Chronic kidney disease, stage 3 unspecified: Secondary | ICD-10-CM | POA: Diagnosis not present

## 2019-03-25 ENCOUNTER — Other Ambulatory Visit: Payer: Self-pay

## 2019-03-25 MED ORDER — FUROSEMIDE 20 MG PO TABS: 20.0000 mg | ORAL_TABLET | Freq: Every day | ORAL | 1 refills | Status: AC

## 2019-03-26 DIAGNOSIS — N183 Chronic kidney disease, stage 3 unspecified: Secondary | ICD-10-CM | POA: Diagnosis not present

## 2019-03-26 DIAGNOSIS — G3184 Mild cognitive impairment, so stated: Secondary | ICD-10-CM | POA: Diagnosis not present

## 2019-03-26 DIAGNOSIS — I5033 Acute on chronic diastolic (congestive) heart failure: Secondary | ICD-10-CM | POA: Diagnosis not present

## 2019-03-26 DIAGNOSIS — I130 Hypertensive heart and chronic kidney disease with heart failure and stage 1 through stage 4 chronic kidney disease, or unspecified chronic kidney disease: Secondary | ICD-10-CM | POA: Diagnosis not present

## 2019-03-26 DIAGNOSIS — I2510 Atherosclerotic heart disease of native coronary artery without angina pectoris: Secondary | ICD-10-CM | POA: Diagnosis not present

## 2019-03-26 DIAGNOSIS — J4520 Mild intermittent asthma, uncomplicated: Secondary | ICD-10-CM | POA: Diagnosis not present

## 2019-03-31 ENCOUNTER — Encounter: Payer: Self-pay | Admitting: Internal Medicine

## 2019-03-31 NOTE — Telephone Encounter (Signed)
Message routed to Reed, Tiffany L, DO  

## 2019-04-02 ENCOUNTER — Other Ambulatory Visit: Payer: Self-pay

## 2019-04-04 ENCOUNTER — Telehealth: Payer: Self-pay | Admitting: *Deleted

## 2019-04-04 ENCOUNTER — Other Ambulatory Visit: Payer: Medicare Other

## 2019-04-04 NOTE — Telephone Encounter (Signed)
Kelly with [facility] called and stated that she was needing verbal orders to move her PT appointment to next week due to House damage from [place].  Verbal orders given.

## 2019-04-08 DIAGNOSIS — G3184 Mild cognitive impairment, so stated: Secondary | ICD-10-CM | POA: Diagnosis not present

## 2019-04-08 DIAGNOSIS — I2510 Atherosclerotic heart disease of native coronary artery without angina pectoris: Secondary | ICD-10-CM | POA: Diagnosis not present

## 2019-04-08 DIAGNOSIS — I130 Hypertensive heart and chronic kidney disease with heart failure and stage 1 through stage 4 chronic kidney disease, or unspecified chronic kidney disease: Secondary | ICD-10-CM | POA: Diagnosis not present

## 2019-04-08 DIAGNOSIS — I5033 Acute on chronic diastolic (congestive) heart failure: Secondary | ICD-10-CM | POA: Diagnosis not present

## 2019-04-08 DIAGNOSIS — N183 Chronic kidney disease, stage 3 unspecified: Secondary | ICD-10-CM | POA: Diagnosis not present

## 2019-04-08 DIAGNOSIS — J4520 Mild intermittent asthma, uncomplicated: Secondary | ICD-10-CM | POA: Diagnosis not present

## 2019-04-09 ENCOUNTER — Telehealth (HOSPITAL_COMMUNITY): Payer: Self-pay

## 2019-04-09 ENCOUNTER — Other Ambulatory Visit: Payer: Self-pay | Admitting: Internal Medicine

## 2019-04-09 DIAGNOSIS — E0590 Thyrotoxicosis, unspecified without thyrotoxic crisis or storm: Secondary | ICD-10-CM

## 2019-04-09 DIAGNOSIS — J4520 Mild intermittent asthma, uncomplicated: Secondary | ICD-10-CM | POA: Diagnosis not present

## 2019-04-09 DIAGNOSIS — N183 Chronic kidney disease, stage 3 unspecified: Secondary | ICD-10-CM | POA: Diagnosis not present

## 2019-04-09 DIAGNOSIS — I5033 Acute on chronic diastolic (congestive) heart failure: Secondary | ICD-10-CM | POA: Diagnosis not present

## 2019-04-09 DIAGNOSIS — I2510 Atherosclerotic heart disease of native coronary artery without angina pectoris: Secondary | ICD-10-CM | POA: Diagnosis not present

## 2019-04-09 DIAGNOSIS — I130 Hypertensive heart and chronic kidney disease with heart failure and stage 1 through stage 4 chronic kidney disease, or unspecified chronic kidney disease: Secondary | ICD-10-CM | POA: Diagnosis not present

## 2019-04-09 DIAGNOSIS — G3184 Mild cognitive impairment, so stated: Secondary | ICD-10-CM | POA: Diagnosis not present

## 2019-04-09 NOTE — Telephone Encounter (Signed)
COVID-19 pre-appointment screening questions: SON, [name] ANSWERED QUESTIONS   Do you have a history of COVID-19 or a positive test result in the past 7-10 days? NO  To the best of your knowledge, have you been in close contact with anyone with a confirmed diagnosis of COVID 19?NO  Have you had any one or more of the following: Fever, chills, cough, shortness of breath (out of the normal for you) or any flu-like symptoms?NO  Are you experiencing any of the following symptoms that is new or out of usual for you:NO   Ear, nose or throat discomfort  Sore throat  Headache  Muscle Pain  Diarrhea  Loss of taste or smell   Reviewed all the following with patient: REVIEWED  Use of hand sanitizer when entering the building  Everyone is required to wear a mask in the building, if you do not have a mask we are happy to provide you with one when you arrive  NO Visitor guidelines   If patient answers YES to any of questions they must change to a virtual visit and place note in comments about symptoms

## 2019-04-10 ENCOUNTER — Other Ambulatory Visit: Payer: Self-pay

## 2019-04-10 ENCOUNTER — Ambulatory Visit (HOSPITAL_COMMUNITY)
Admission: RE | Admit: 2019-04-10 | Discharge: 2019-04-10 | Disposition: A | Discharge status: Home / Self Care | Payer: Medicare Other | Source: Ambulatory Visit | Attending: Internal Medicine | Admitting: Internal Medicine

## 2019-04-10 ENCOUNTER — Ambulatory Visit: Payer: Medicare Other | Admitting: Internal Medicine

## 2019-04-10 VITALS — BP 136/60 | HR 72 | Wt 88.8 lb

## 2019-04-10 DIAGNOSIS — I5032 Chronic diastolic (congestive) heart failure: Secondary | ICD-10-CM | POA: Diagnosis not present

## 2019-04-10 DIAGNOSIS — Z881 Allergy status to other antibiotic agents status: Secondary | ICD-10-CM | POA: Insufficient documentation

## 2019-04-10 DIAGNOSIS — Z885 Allergy status to narcotic agent status: Secondary | ICD-10-CM | POA: Insufficient documentation

## 2019-04-10 DIAGNOSIS — I495 Sick sinus syndrome: Secondary | ICD-10-CM | POA: Diagnosis not present

## 2019-04-10 DIAGNOSIS — I480 Paroxysmal atrial fibrillation: Secondary | ICD-10-CM | POA: Insufficient documentation

## 2019-04-10 DIAGNOSIS — J4520 Mild intermittent asthma, uncomplicated: Secondary | ICD-10-CM | POA: Diagnosis not present

## 2019-04-10 DIAGNOSIS — I2510 Atherosclerotic heart disease of native coronary artery without angina pectoris: Secondary | ICD-10-CM | POA: Diagnosis not present

## 2019-04-10 DIAGNOSIS — I441 Atrioventricular block, second degree: Secondary | ICD-10-CM | POA: Insufficient documentation

## 2019-04-10 DIAGNOSIS — G3184 Mild cognitive impairment, so stated: Secondary | ICD-10-CM | POA: Insufficient documentation

## 2019-04-10 DIAGNOSIS — R627 Adult failure to thrive: Secondary | ICD-10-CM | POA: Diagnosis not present

## 2019-04-10 DIAGNOSIS — N184 Chronic kidney disease, stage 4 (severe): Secondary | ICD-10-CM | POA: Diagnosis not present

## 2019-04-10 DIAGNOSIS — Z79899 Other long term (current) drug therapy: Secondary | ICD-10-CM | POA: Insufficient documentation

## 2019-04-10 DIAGNOSIS — Z951 Presence of aortocoronary bypass graft: Secondary | ICD-10-CM | POA: Insufficient documentation

## 2019-04-10 DIAGNOSIS — Z7901 Long term (current) use of anticoagulants: Secondary | ICD-10-CM | POA: Diagnosis not present

## 2019-04-10 DIAGNOSIS — I130 Hypertensive heart and chronic kidney disease with heart failure and stage 1 through stage 4 chronic kidney disease, or unspecified chronic kidney disease: Secondary | ICD-10-CM | POA: Diagnosis not present

## 2019-04-10 DIAGNOSIS — I5022 Chronic systolic (congestive) heart failure: Secondary | ICD-10-CM | POA: Diagnosis not present

## 2019-04-10 DIAGNOSIS — I5033 Acute on chronic diastolic (congestive) heart failure: Secondary | ICD-10-CM | POA: Diagnosis not present

## 2019-04-10 DIAGNOSIS — E039 Hypothyroidism, unspecified: Secondary | ICD-10-CM | POA: Diagnosis not present

## 2019-04-10 DIAGNOSIS — Z8249 Family history of ischemic heart disease and other diseases of the circulatory system: Secondary | ICD-10-CM | POA: Insufficient documentation

## 2019-04-10 DIAGNOSIS — Z888 Allergy status to other drugs, medicaments and biological substances status: Secondary | ICD-10-CM | POA: Insufficient documentation

## 2019-04-10 DIAGNOSIS — Z880 Allergy status to penicillin: Secondary | ICD-10-CM | POA: Insufficient documentation

## 2019-04-10 DIAGNOSIS — Z950 Presence of cardiac pacemaker: Secondary | ICD-10-CM | POA: Diagnosis not present

## 2019-04-10 DIAGNOSIS — I951 Orthostatic hypotension: Secondary | ICD-10-CM | POA: Diagnosis not present

## 2019-04-10 DIAGNOSIS — Z8349 Family history of other endocrine, nutritional and metabolic diseases: Secondary | ICD-10-CM | POA: Insufficient documentation

## 2019-04-10 DIAGNOSIS — I739 Peripheral vascular disease, unspecified: Secondary | ICD-10-CM | POA: Diagnosis not present

## 2019-04-10 NOTE — Progress Notes (Signed)
[facility] CONSULT NOTE  Referring Physician: Dr. Hollace Kinnier Primary Care: Dr. Hollace Kinnier Primary Cardiologist: Dr. Sallyanne Kuster  HPI:  Holly Bolton is a 86 y.o. female PAD, PAF, CKD (creatinine1.5)  CAD status post CABG, s/p AVR & ascending aortic aneurysm repair, pacemaker for SSS and 2nd deg AV block. She has been followed by Dr. Recardo Evangelist. She is referred to the HF Clinic by DR. Reed for further evaluation of her HF.    She underwent aortic root replacement with a Medtronic Freestyle root, with coronary reimplantation and a single vessel LIMA to LAD bypass in 1999 (for bicuspid valve with severe stenosis and ascending aortic aneurysm). She received a dual-chamber permanent pacemaker for sinus node dysfunction and intermittent high-grade AV block. She had a pacemaker generator change in 2010 ([address]. [place] accent). She has hyperlipidemia but prefers not to take statin medication.  She was hospitalized in April 2020 and again in December 2020 with acute exacerbation of heart failure, in December related to persistent atrial fibrillation.  In September 2019 EF was 50-55%, in December 2028 had dropped to 30-35%.  She underwent electrical cardioversion on February 10, 2019. Was diuresed at that time and weight was 88 pounds.   Most recent echo December 2020  with normal bioprosthesis function with an LVEF 30-35%.  The left atrium is severely dilated.  Recently struggling with failure to thrive and worsening LE edema. Weight over 90 pounds. Had been on lasix 20mg  of lasix. Saw Dr. Mariea Clonts on 03/10/19 and lasix increased to 40mg  daily for 2 days and weight back down. Has also struggled with orthostatic hypotension and goal SBP b Dr. Recardo Evangelist has been < 150   Now weight staying at 86-87 pounds non lasix 20. Has not had to use extra lasix since last month  Uses a cane to get around the house. Has been weaker lately. + ankle edema. No orthopnea or PND. Has HHRN coming out.    Review of Systems:  [y] = yes, [ ]  = no   General: Weight gain [ ] ; Weight loss Blue.Reese ]; Anorexia Blue.Reese ]; Fatigue Blue.Reese ]; Fever [ ] ; Chills [ ] ; Weakness Blue.Reese ]  Cardiac: Chest pain/pressure [ ] ; Resting SOB [ ] ; Exertional SOB Blue.Reese ]; Orthopnea [ ] ; Pedal Edema Blue.Reese ]; Palpitations [ ] ; Syncope [ ] ; Presyncope [ ] ; Paroxysmal nocturnal dyspnea[ ]   Pulmonary: Cough [ ] ; Wheezing[ ] ; Hemoptysis[ ] ; Sputum [ ] ; Snoring [ ]   GI: Vomiting[ ] ; Dysphagia[ ] ; Melena[ ] ; Hematochezia [ ] ; Heartburn[ ] ; Abdominal pain [ ] ; Constipation [ ] ; Diarrhea [ ] ; BRBPR [ ]   GU: Hematuria[ ] ; Dysuria [ ] ; Nocturia[ ]   Vascular: Pain in legs with walking [ ] ; Pain in feet with lying flat [ ] ; Non-healing sores [ ] ; Stroke [ ] ; TIA [ ] ; Slurred speech [ ] ;  Neuro: Headaches[ ] ; Vertigo[ ] ; Seizures[ ] ; Paresthesias[ ] ;Blurred vision [ ] ; Diplopia [ ] ; Vision changes [ ]   Ortho/Skin: Arthritis Blue.Reese ]; Joint pain [ y]; Muscle pain [ ] ; Joint swelling [ ] ; Back Pain [ ] ; Rash [ ]   Psych: Depression[ ] ; Anxiety[ ]   Heme: Bleeding problems [ ] ; Clotting disorders [ ] ; Anemia [ ]   Endocrine: Diabetes [ ] ; Thyroid dysfunction[ ]    Past Medical History:  Diagnosis Date  . Aneurysm (arteriovenous) of coronary vessels    ascending aorta requiring bypass of the LAD  . Carotid stenosis 04/28/08   Doppler: <40% stenosis bilateral  . Chronic kidney disease,  stage 3   . Hyperlipemia   . Hypertension   . Hypertensive chronic kidney disease   . Hypothyroidism, unspecified 02/13/2018   Symptoms well controlled with current therapy  . Mild cognitive impairment, so stated 03/28/2018   Symptoms poorly controlled, needs frequent adjustments in treatment and dose monitoring  . Mild intermittent asthma    Symptoms controlled with difficulty, affecting daily functioning, needs ongoing monitoring  . Occlusion and stenosis of bilateral carotid arteries   . Pacemaker generator end of life 11/18/08   Intermittent high-grade atrioventricular block  . Peripheral  arterial disease (HCC)    left ABI of 0.78  . Presence of permanent cardiac pacemaker 08/26/01   Sinus node dysfunction-[place]  . Presence of xenogenic heart valve   . PVD (peripheral vascular disease) ([place])   . SSS (sick sinus syndrome) ([place])   . Status post ascending aortic aneurysm repair/AVR -  Medtronic Freestyle root 09/28/2014   ascending aorta requiring bypass of the LAD     Current Outpatient Medications  Medication Sig Dispense Refill  . acetaminophen (TYLENOL) 500 MG tablet Take 500 mg by mouth every 6 (six) hours as needed for mild pain or headache.    . albuterol (ACCUNEB) 0.63 MG/3ML nebulizer solution Take 3 mLs (0.63 mg total) by nebulization every 6 (six) hours as needed for wheezing. 75 mL 1  . apixaban (ELIQUIS) 2.5 MG TABS tablet Take 1 tablet (2.5 mg total) by mouth 2 (two) times daily. 60 tablet 11  . brimonidine (ALPHAGAN) 0.2 % ophthalmic solution Place 1 drop into both eyes 2 (two) times daily.   2  . carvedilol (COREG) 3.125 MG tablet TAKE ONE TABLET BY MOUTH TWICE A DAY WITH FOOD 180 tablet 1  . Cholecalciferol (VITAMIN D) 50 MCG (2000 UT) CAPS Take 1 capsule by mouth daily.    . Coenzyme Q10 (COQ10) 200 MG CAPS Take 200 mg by mouth daily.    . furosemide (LASIX) 20 MG tablet Take 1 tablet (20 mg total) by mouth daily. 30 tablet 1  . methimazole (TAPAZOLE) 5 MG tablet Take 2.5 mg by mouth daily.    . vitamin B-12 (CYANOCOBALAMIN) 500 MCG tablet Take 500 mcg by mouth daily.     No current facility-administered medications for this encounter.    Allergies  Allergen Reactions  . Accupril [Quinapril Hcl] Other (See Comments)    Unknown reaction   . Amlodipine Swelling  . Benadryl [Diphenhydramine Hcl] Other (See Comments)    Unknown reaction   . Biaxin [Clarithromycin] Other (See Comments)    Unknown reaction   . Ciprofloxacin Other (See Comments)    Unknown reaction   . Codeine Other (See Comments)    Unknown reaction   . Diovan [Valsartan] Other (See  Comments)    Unknown reaction   . Medrol [Methylprednisolone] Other (See Comments)    Unknown  . Morphine And Related Other (See Comments)    Unknown reaction   . Neurontin [Gabapentin] Other (See Comments)    Unknown reaction   . Penicillins     Unknown reaction  Has patient had a PCN reaction causing immediate rash, facial/tongue/throat swelling, SOB or lightheadedness with hypotension: Unknown Has patient had a PCN reaction causing severe rash involving mucus membranes or skin necrosis: Unknown Has patient had a PCN reaction that required hospitalization: Unknown Has patient had a PCN reaction occurring within the last 10 years: No If all of the above answers are "NO", then may proceed with Cephalosporin use.  Social History   Socioeconomic History  . Marital status: Widowed    Spouse name: Not on file  . Number of children: 1  . Years of education: 36  . Highest education level: High school graduate  Occupational History  . 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 Smoking status: Never Smoker  . Smokeless tobacco: Never Used  Substance and Sexual Activity  . Alcohol use: No  . Drug use: No  . Sexual activity: Not Currently  Other Topics Concern  . Not on file  Social History Narrative   Lives with son, Holly Bolton   Caffeine use: 1 cup coffee per day   Right handed   Exercises at home with theraband, stationary bike and walking   Social Determinants of Health   Financial Resource Strain:   . Difficulty of Paying Living Expenses: Not on file  Food Insecurity:   . Worried About Charity fundraiser in the Last Year: Not on file  . Ran Out of Food in the Last Year: Not on file  Transportation Needs:   . Lack of Transportation (Medical): Not on file  . Lack of Transportation (Non-Medical): Not on file  Physical Activity:   . Days of Exercise per Week: Not on file  . Minutes of Exercise per Session: Not on file  Stress:   . Feeling  of Stress : Not on file  Social Connections:   . Frequency of Communication with Friends and Family: Not on file  . Frequency of Social Gatherings with Friends and Family: Not on file  . Attends Religious Services: Not on file  . Active Member of Clubs or Organizations: Not on file  . Attends Archivist Meetings: Not on file  . Marital Status: Not on file  Intimate Partner Violence:   . Fear of Current or Ex-Partner: Not on file  . Emotionally Abused: Not on file  . Physically Abused: Not on file  . Sexually Abused: Not on file      Family History  Problem Relation Age of Onset  . Heart attack Mother   . Heart attack Father   . Heart attack Brother   . Hyperlipidemia Sister   . Hypertension Sister     Vitals:   04/10/19 1410  BP: 136/60  Pulse: 72  SpO2: 98%  Weight: 40.3 kg (88 lb 12.8 oz)    PHYSICAL EXAM: General:  Elderly. Frail appearing. No respiratory difficulty HEENT: normal Neck: supple. no JVD. Carotids 2+ bilat; no bruits. No lymphadenopathy or thryomegaly appreciated. Cor: PMI nondisplaced. Regular rate & rhythm. No rubs, gallops or murmurs. Lungs: clear Abdomen: soft, nontender, nondistended. No hepatosplenomegaly. No bruits or masses. Good bowel sounds. Extremities: no cyanosis, clubbing, rash,  2+  Ankle edema Neuro: alert & oriented x 3, cranial nerves grossly intact. moves all 4 extremities w/o difficulty. Affect pleasant.  ECG: NSR intermittent A-pacing + vpacing Personally reviewed   ASSESSMENT & PLAN:  1. Chronic systolic HF - has been followed closely by Dr. Bonne Dolores   - September 2019 EF was 50-55%,   - December 2028 had dropped to 30-35% in setting of PAF.  She underwent electrical cardioversion on February 10, 2019 (unsure if drop due to AF or RV pacing) - She has had serious problems with orthostatic hypotension.  She is on a low-dose of carvedilol and a low-dose of loop diuretic, but I do not think she would tolerate RAAS  inhibitors. - Volume status now  under good control. Has mile ankle edema but likely venous stasis. Will place compression stockings.  - Continue lasix 20 daily. Increase to 40 daily as needed if weight > 90 pounds - Has f/u with Dr. Loletha Grayer on 06/02/19. I will order repeat echo at that visit.   2. H/o SSS with 2nd deg AV block:  - s/p PPM. Followed by Dr. Loletha Grayer  3. CAD and aortic stenosis status post CABG, s/p AVR & ascending aortic aneurysm repair - stable no angina  4. CKD 4  - baseline creatinine 1.5  5. Orthostatic hypotension - would not push anti-HTN agents  We will follow as needed. HF education provided to her and her son who accompanied her today.   Glori Bickers, MD  2:52 PM

## 2019-04-10 NOTE — Progress Notes (Signed)
[facility] note says "echo at f/u visit" with Dr. Loletha Grayer 06/02/19. Can we please make sure echo is done before visit? (even if it's just earlier that day, so I can review the images)

## 2019-04-10 NOTE — Progress Notes (Signed)
Thanks!  , Dan

## 2019-04-10 NOTE — Patient Instructions (Addendum)
No medication changes today!  If weight goes above 90lbs take double your lasix dose for that day.  Call office if no improvement.  You were provided a prescription for compression stockings.   Your physician recommends that you schedule a follow-up appointment as needed.  Please call office at [phone] option [phone] if you have any questions or concerns.    At the [facility], you and your health needs are our priority. As part of our continuing mission to provide you with exceptional heart care, we have created designated Provider Care Teams. These Care Teams include your primary Cardiologist (physician) and Advanced Practice Providers (APPs- Physician Assistants and Nurse Practitioners) who all work together to provide you with the care you need, when you need it.   You may see any of the following providers on your designated Care Team at your next follow up: Marland Kitchen Dr Glori Bickers . Dr Loralie Champagne . Darrick Grinder, NP . Lyda Jester, PA . Audry Riles, PharmD   Please be sure to bring in all your medications bottles to every appointment.

## 2019-04-14 ENCOUNTER — Other Ambulatory Visit: Payer: Self-pay

## 2019-04-14 ENCOUNTER — Other Ambulatory Visit: Payer: Medicare Other

## 2019-04-14 DIAGNOSIS — E0590 Thyrotoxicosis, unspecified without thyrotoxic crisis or storm: Secondary | ICD-10-CM | POA: Diagnosis not present

## 2019-04-14 DIAGNOSIS — I4891 Unspecified atrial fibrillation: Secondary | ICD-10-CM | POA: Diagnosis not present

## 2019-04-14 DIAGNOSIS — I5042 Chronic combined systolic (congestive) and diastolic (congestive) heart failure: Secondary | ICD-10-CM

## 2019-04-15 LAB — BASIC METABOLIC PANEL
BUN/Creatinine Ratio: 24 (calc) — ABNORMAL HIGH (ref 6–22)
BUN: 37 mg/dL — ABNORMAL HIGH (ref 7–25)
CO2: 30 mmol/L (ref 20–32)
Calcium: 9.3 mg/dL (ref 8.6–10.4)
Chloride: 101 mmol/L (ref 98–110)
Creat: 1.52 mg/dL — ABNORMAL HIGH (ref 0.60–0.88)
Glucose, Bld: 114 mg/dL — ABNORMAL HIGH (ref 65–99)
Potassium: 4 mmol/L (ref 3.5–5.3)
Sodium: 139 mmol/L (ref 135–146)

## 2019-04-15 LAB — TSH: TSH: 9.3 mIU/L — ABNORMAL HIGH (ref 0.40–4.50)

## 2019-04-15 LAB — BRAIN NATRIURETIC PEPTIDE: Brain Natriuretic Peptide: 552 pg/mL — ABNORMAL HIGH (ref <100)

## 2019-04-15 LAB — T4, FREE: Free T4: 1 ng/dL (ref 0.8–1.8)

## 2019-04-15 NOTE — Progress Notes (Signed)
Kidneys are stable. Electrolytes are normal. Free T4 is in the normal range (actual thyroid hormone) while the TSH remains elevated at 9.3 which is much improved from 18.3.  We will discuss and make a plan at her appt.   The heart failure marker is not very high considering her age and kidney function so that is good news.

## 2019-04-17 ENCOUNTER — Ambulatory Visit (INDEPENDENT_AMBULATORY_CARE_PROVIDER_SITE_OTHER): Payer: Medicare Other | Admitting: Internal Medicine

## 2019-04-17 ENCOUNTER — Other Ambulatory Visit: Payer: Self-pay

## 2019-04-17 ENCOUNTER — Encounter: Payer: Self-pay | Admitting: Internal Medicine

## 2019-04-17 VITALS — BP 120/72 | HR 69 | Temp 97.1°F | Ht 62.0 in | Wt 88.0 lb

## 2019-04-17 DIAGNOSIS — I4891 Unspecified atrial fibrillation: Secondary | ICD-10-CM | POA: Diagnosis not present

## 2019-04-17 DIAGNOSIS — E0590 Thyrotoxicosis, unspecified without thyrotoxic crisis or storm: Secondary | ICD-10-CM

## 2019-04-17 DIAGNOSIS — I2510 Atherosclerotic heart disease of native coronary artery without angina pectoris: Secondary | ICD-10-CM

## 2019-04-17 DIAGNOSIS — I5042 Chronic combined systolic (congestive) and diastolic (congestive) heart failure: Secondary | ICD-10-CM | POA: Diagnosis not present

## 2019-04-17 DIAGNOSIS — N184 Chronic kidney disease, stage 4 (severe): Secondary | ICD-10-CM

## 2019-04-17 NOTE — Patient Instructions (Signed)
Stop methimazole.  We will recheck tsh and free T4 in 3 months.

## 2019-04-17 NOTE — Progress Notes (Signed)
Location:  [facility] clinic Provider:  Holt Woolbright L. Mariea Clonts, D.O., C.M.D.  Goals of Care:  Advanced Directives 04/17/2019  Does Patient Have a Medical Advance Directive? No  Type of Advance Directive -  Does patient want to make changes to medical advance directive? No - Patient declined  Would patient like information on creating a medical advance directive? -     Chief Complaint  Patient presents with  . Medical Management of Chronic Issues    6 week follow up on CHF and A. FIB/ SOB    HPI: Patient is a 86 y.o. female seen today for medical management of chronic diseases.    She has been c/o sob lately--every now and then a couple times per week.  She had albuterol neb this am that helped.  It's not a drastic help, but improves a little.  Weight is steady at 85-87 lbs.  If she does anything (like her PT), she has to stop and catch her breath where 6 mos ago, she did not.    Ed was looking for the mild compression hose for her and everyone was sold out of those--only the medium and high were available at the medical supply.    Hyperthyroidism:  At initial presentation, was hyper, got methimazole, she went off, then it bounced back up.  She was seeing endocrinology and they were recommending radioactive iodine followed by synthroid which Ayianna refused.    Past Medical History:  Diagnosis Date  . Aneurysm (arteriovenous) of coronary vessels    ascending aorta requiring bypass of the LAD  . Carotid stenosis 04/28/08   Doppler: <40% stenosis bilateral  . Chronic kidney disease, stage 3   . Hyperlipemia   . Hypertension   . Hypertensive chronic kidney disease   . Hypothyroidism, unspecified 02/13/2018   Symptoms well controlled with current therapy  . Mild cognitive impairment, so stated 03/28/2018   Symptoms poorly controlled, needs frequent adjustments in treatment and dose monitoring  . Mild intermittent asthma    Symptoms controlled with difficulty, affecting daily functioning, needs  ongoing monitoring  . Occlusion and stenosis of bilateral carotid arteries   . Pacemaker generator end of life 11/18/08   Intermittent high-grade atrioventricular block  . Peripheral arterial disease (HCC)    left ABI of 0.78  . Presence of permanent cardiac pacemaker 08/26/01   Sinus node dysfunction-[place]  . Presence of xenogenic heart valve   . PVD (peripheral vascular disease) ([place])   . SSS (sick sinus syndrome) ([place])   . Status post ascending aortic aneurysm repair/AVR -  Medtronic Freestyle root 09/28/2014   ascending aorta requiring bypass of the LAD     Past Surgical History:  Procedure Laterality Date  . ABDOMINAL HYSTERECTOMY  1966  . APPENDECTOMY  1943  . ASCENDING AORTIC ANEURYSM REPAIR  09/22/97   porcine aortic root  . BREAST FIBROADENOMA SURGERY  11/89  . CARDIAC SURGERY  09/22/1997   aortic valve root repair with porcine at time of CABG  . CARDIOVERSION N/A 02/10/2019   Procedure: CARDIOVERSION;  Surgeon: Skeet Latch, MD;  Location: [place];  Service: Cardiovascular;  Laterality: N/A;  . CORONARY ARTERY BYPASS GRAFT  09/22/97   LIMA to the LAD  . I & D EXTREMITY Left 08/15/2017   Procedure: IRRIGATION AND DEBRIDEMENT EXTREMITY;  Surgeon: Nicholes Stairs, MD;  Location: [place];  Service: Orthopedics;  Laterality: Left;  . [place]   left  . NEPHRECTOMY  1958   right  .  ORIF HUMERUS FRACTURE Left 08/15/2017   Procedure: OPEN REDUCTION INTERNAL FIXATION (ORIF) PROXIMAL HUMERUS FRACTURE;  Surgeon: Nicholes Stairs, MD;  Location: [place];  Service: Orthopedics;  Laterality: Left;  . OVARIAN CYST SURGERY  1948  . PACEMAKER GENERATOR CHANGE  11/18/08   [place]  . PACEMAKER INSERTION  08/26/01   [place]  . PPM GENERATOR CHANGEOUT N/A 05/16/2017   Procedure: PPM GENERATOR CHANGEOUT;  Surgeon: Sanda Klein, MD;  Location: [place] [facility];  Service: Cardiovascular;  Laterality: N/A;    Allergies  Allergen Reactions  . Accupril  [Quinapril Hcl] Other (See Comments)    Unknown reaction   . Amlodipine Swelling  . Benadryl [Diphenhydramine Hcl] Other (See Comments)    Unknown reaction   . Biaxin [Clarithromycin] Other (See Comments)    Unknown reaction   . Ciprofloxacin Other (See Comments)    Unknown reaction   . Codeine Other (See Comments)    Unknown reaction   . Diovan [Valsartan] Other (See Comments)    Unknown reaction   . Medrol [Methylprednisolone] Other (See Comments)    Unknown  . Morphine And Related Other (See Comments)    Unknown reaction   . Neurontin [Gabapentin] Other (See Comments)    Unknown reaction   . Penicillins     Unknown reaction  Has patient had a PCN reaction causing immediate rash, facial/tongue/throat swelling, SOB or lightheadedness with hypotension: Unknown Has patient had a PCN reaction causing severe rash involving mucus membranes or skin necrosis: Unknown Has patient had a PCN reaction that required hospitalization: Unknown Has patient had a PCN reaction occurring within the last 10 years: No If all of the above answers are "NO", then may proceed with Cephalosporin use.     Outpatient Encounter Medications as of 04/17/2019  Medication Sig  . acetaminophen (TYLENOL) 500 MG tablet Take 500 mg by mouth every 6 (six) hours as needed for mild pain or headache.  . albuterol (ACCUNEB) 0.63 MG/3ML nebulizer solution Take 3 mLs (0.63 mg total) by nebulization every 6 (six) hours as needed for wheezing.  Marland Kitchen apixaban (ELIQUIS) 2.5 MG TABS tablet Take 1 tablet (2.5 mg total) by mouth 2 (two) times daily.  . brimonidine (ALPHAGAN) 0.2 % ophthalmic solution Place 1 drop into both eyes 2 (two) times daily.   . carvedilol (COREG) 3.125 MG tablet TAKE ONE TABLET BY MOUTH TWICE A DAY WITH FOOD  . Cholecalciferol (VITAMIN D) 50 MCG (2000 UT) CAPS Take 1 capsule by mouth daily.  . Coenzyme Q10 (COQ10) 200 MG CAPS Take 200 mg by mouth daily.  . furosemide (LASIX) 20 MG tablet Take 1 tablet (20 mg  total) by mouth daily.  . methimazole (TAPAZOLE) 5 MG tablet Take 2.5 mg by mouth daily.  . vitamin B-12 (CYANOCOBALAMIN) 500 MCG tablet Take 500 mcg by mouth daily.   No facility-administered encounter medications on file as of 04/17/2019.    Review of Systems:  Review of Systems  Constitutional: Positive for malaise/fatigue. Negative for chills and fever.       Stable weight  HENT: Negative for congestion.   Eyes: Negative for blurred vision.       Glasses  Respiratory: Positive for shortness of breath. Negative for cough and wheezing.   Cardiovascular: Positive for leg swelling. Negative for chest pain, palpitations, orthopnea and PND.  Gastrointestinal: Negative for abdominal pain.  Genitourinary: Negative for dysuria.  Musculoskeletal: Positive for joint pain.       Knee becoming more bothersome  Neurological:  Negative for dizziness and loss of consciousness.  Endo/Heme/Allergies: Bruises/bleeds easily.    Health Maintenance  Topic Date Due  . DEXA SCAN  01/14/2020 (Originally 10/17/1989)  . TETANUS/TDAP  08/15/2027  . INFLUENZA VACCINE  Completed  . PNA vac Low Risk Adult  Completed    Physical Exam: Vitals:   04/17/19 1519  BP: 120/72  Pulse: 69  Temp: (!) 97.1 F (36.2 C)  TempSrc: Temporal  SpO2: 98%  Weight: 88 lb (39.9 kg)  Height: 5\' 2"  (1.575 m)   Body mass index is 16.1 kg/m. Physical Exam Vitals reviewed.  Constitutional:      General: She is not in acute distress.    Appearance: Normal appearance. She is not ill-appearing or toxic-appearing.  Cardiovascular:     Rate and Rhythm: Normal rate and regular rhythm.     Heart sounds: No murmur.  Pulmonary:     Effort: Pulmonary effort is normal.     Breath sounds: Normal breath sounds. No wheezing, rhonchi or rales.  Musculoskeletal:        General: Normal range of motion.     Cervical back: No rigidity.     Right lower leg: Edema present.     Left lower leg: Edema (bilateral feet and lower legs  edematous and mildly tender) present.     Comments: Ambulates with quad cane with short steps  Skin:    General: Skin is warm and dry.     Comments: Ecchymoses of forehead (v-shaped from hitting head while bending over on corner of cabinet she'd left ajar)  Neurological:     General: No focal deficit present.     Mental Status: She is alert. Mental status is at baseline.  Psychiatric:        Mood and Affect: Mood normal.        Behavior: Behavior normal.     Labs reviewed: Basic Metabolic Panel: Recent Labs    10/11/18 2301 10/12/18 0426 10/15/18 0504 10/24/18 0909 01/22/19 1732 01/23/19 0251 01/24/19 0326 01/30/19 1423 03/10/19 1545 03/13/19 1556 03/13/19 1557 04/14/19 1054  NA  --    < > 138   < >  --    < > 143   < > 143 142  --  139  K  --    < > 3.8   < >  --    < > 3.5   < > 4.3 4.4  --  4.0  CL  --    < > 98   < >  --    < > 99   < > 102 102  --  101  CO2  --    < > 30   < >  --    < > 34*   < > 30 26  --  30  GLUCOSE  --    < > 105*   < >  --    < > 103*   < > 125 163*  --  114*  BUN  --    < > 36*   < >  --    < > 37*   < > 41* 38*  --  37*  CREATININE 1.66*   < > 1.71*   < >  --    < > 1.73*   < > 1.47* 1.53*  --  1.52*  CALCIUM  --    < > 9.0   < >  --    < > 9.0   < >  9.4 9.0  --  9.3  MG 2.3  --  2.0  --  2.3  --  2.0  --   --   --   --   --   PHOS 4.2  --   --   --  4.2  --   --   --   --   --   --   --   TSH 8.881*  --   --    < >  --   --  7.923*  --   --   --  18.300* 9.30*   < > = values in this interval not displayed.   Liver Function Tests: Recent Labs    10/11/18 1715 10/11/18 1715 11/19/18 1018 01/22/19 1335 01/23/19 0251  AST 22   < > 17 20 18   ALT 19   < > 10 11 14   ALKPHOS 80  --   --  75 68  BILITOT 1.3*   < > 1.2 0.7 1.1  PROT 6.9   < > 6.6 6.6 5.8*  ALBUMIN 3.8  --   --  3.7 3.4*   < > = values in this interval not displayed.   Recent Labs    10/11/18 1715  LIPASE 51   No results for input(s): AMMONIA in the last 8760  hours. CBC: Recent Labs    10/24/18 0909 10/24/18 0909 11/19/18 1018 01/22/19 1145 01/30/19 1423 02/05/19 1035 03/13/19 1555  WBC 4.4   < > 5.9   < > 6.1 5.9 5.6  NEUTROABS 2,328  --  2,985  --  3,996  --   --   HGB 9.6*   < > 10.2*   < > 11.7 11.4 11.0*  HCT 29.5*   < > 31.0*   < > 34.7* 33.5* 33.1*  MCV 101.0*   < > 101.3*   < > 100.6* 97 100*  PLT 220   < > 132*   < > 193 224 245   < > = values in this interval not displayed.   Lipid Panel: No results for input(s): CHOL, HDL, LDLCALC, TRIG, CHOLHDL, LDLDIRECT in the last 8760 hours. No results found for: HGBA1C   Assessment/Plan 1. Chronic combined systolic and diastolic heart failure ([place]) - is for f/u echo in April to see if EF improved since cardioversion - CBC with Differential/Platelet; Future - Basic metabolic panel; Future  2. Hyperthyroidism - tsh remains high at 9 and free T4 was normal - will stop methimazole at this point and recheck values in 3 mos - TSH; Future - T4, free; Future  3. Atrial fibrillation, unspecified type ([place]) - s/p cardioversion, now in NSR, monitor -seems weight has stabilized since cardioversion done and only lasix 20mg  has been needed - CBC with Differential/Platelet; Future  4. Chronic renal disease, stage IV (HCC) -Avoid nephrotoxic agents like nsaids, dose adjust renally excreted meds, hydrate. - Basic metabolic panel; Future  Labs/tests ordered:   Lab Orders     CBC with Differential/Platelet     Basic metabolic panel     TSH     T4, free  Next appt:  07/21/2019  Ashford Clouse L. Muad Noga, D.O. [facility] [address], [place] [address] Cell Phone (Mon-Fri 8am-5pm):  [phone] On Call:  [phone] & follow prompts after 5pm & weekends Office Phone:  [phone] Office Fax:  [phone]

## 2019-04-22 ENCOUNTER — Telehealth: Payer: Self-pay

## 2019-04-22 NOTE — Telephone Encounter (Signed)
Spoke with pt, he was already aware of Dr. Victorino December recommendation for manual transmission tomorrow.  He will complete in the morning.

## 2019-04-22 NOTE — Telephone Encounter (Signed)
Pt with history of known AF, s/p DCCV on 12/28.  Now with HF being managed by Dr. Haroldine Laws.  Pt meds include Carvedilol 3.125mg  BID and Eliquis 2.5mg  BID.  PM alert received for increased AT/ AF burden with ongoing episode at time of report.    Spoke with pt son, he denies pt exhibiting any symptoms currently.  He stats she did have some SOB on 3/7 but that has resolved.  Son confirms that pt is taking medications as ordered.  No other changes recently.    Forwarding to MD for review.  Pt next f/u visit scheduled for 06/02/19 with echo scheduled just prior.

## 2019-04-22 NOTE — Telephone Encounter (Signed)
Can we please have her do another download tomorrow? If still in AFib we can try to overdrive pace her (rhythm look pretty organized, flutter-like). I can try to do that on Thursday if we can get her a slot in either the CHF clinic or AFib clinic. I am reader B and can run down there and try to to the OD pacing. I would go ahead and book that spot, which we can cancel if her Wednesday download shows return to normal rhythm. Please confirm no missed doses of Eliquis. If we cannot overdrive pace, then can schedule for a true cardioversion (which will be next week since this week schedule is full and she would need COVID screen/labs).

## 2019-04-23 NOTE — Telephone Encounter (Signed)
Remote transmission received this morning.  Pt continue to be in AF.  Forwarding to MD and AF clinicfor further management per MD notes.

## 2019-04-23 NOTE — Telephone Encounter (Signed)
I can bring programmer. Can you text or page me when she arrives, please? By the way, I have another just like her that just popped up. Do you have room for one more tomorrow or Friday?

## 2019-04-23 NOTE — Telephone Encounter (Signed)
Spoke with son, he is aware of appt with [facility] 04/24/19 @11  am with Adline Peals, PA.

## 2019-04-24 ENCOUNTER — Encounter (HOSPITAL_COMMUNITY): Payer: Self-pay | Admitting: Physician Assistant

## 2019-04-24 ENCOUNTER — Other Ambulatory Visit: Payer: Self-pay

## 2019-04-24 ENCOUNTER — Ambulatory Visit (HOSPITAL_COMMUNITY)
Admission: RE | Admit: 2019-04-24 | Discharge: 2019-04-24 | Disposition: A | Discharge status: Home / Self Care | Payer: Medicare Other | Source: Ambulatory Visit | Attending: Physician Assistant | Admitting: Physician Assistant

## 2019-04-24 ENCOUNTER — Telehealth: Payer: Self-pay | Admitting: Cardiovascular Disease

## 2019-04-24 VITALS — BP 150/80 | HR 70 | Ht 62.0 in | Wt 87.8 lb

## 2019-04-24 DIAGNOSIS — N183 Chronic kidney disease, stage 3 unspecified: Secondary | ICD-10-CM | POA: Insufficient documentation

## 2019-04-24 DIAGNOSIS — E039 Hypothyroidism, unspecified: Secondary | ICD-10-CM | POA: Insufficient documentation

## 2019-04-24 DIAGNOSIS — I4892 Unspecified atrial flutter: Secondary | ICD-10-CM | POA: Insufficient documentation

## 2019-04-24 DIAGNOSIS — Z950 Presence of cardiac pacemaker: Secondary | ICD-10-CM | POA: Insufficient documentation

## 2019-04-24 DIAGNOSIS — Z79899 Other long term (current) drug therapy: Secondary | ICD-10-CM | POA: Diagnosis not present

## 2019-04-24 DIAGNOSIS — I739 Peripheral vascular disease, unspecified: Secondary | ICD-10-CM | POA: Diagnosis not present

## 2019-04-24 DIAGNOSIS — Z880 Allergy status to penicillin: Secondary | ICD-10-CM | POA: Insufficient documentation

## 2019-04-24 DIAGNOSIS — I484 Atypical atrial flutter: Secondary | ICD-10-CM | POA: Diagnosis not present

## 2019-04-24 DIAGNOSIS — J4520 Mild intermittent asthma, uncomplicated: Secondary | ICD-10-CM | POA: Diagnosis not present

## 2019-04-24 DIAGNOSIS — Z881 Allergy status to other antibiotic agents status: Secondary | ICD-10-CM | POA: Insufficient documentation

## 2019-04-24 DIAGNOSIS — E785 Hyperlipidemia, unspecified: Secondary | ICD-10-CM | POA: Insufficient documentation

## 2019-04-24 DIAGNOSIS — I130 Hypertensive heart and chronic kidney disease with heart failure and stage 1 through stage 4 chronic kidney disease, or unspecified chronic kidney disease: Secondary | ICD-10-CM | POA: Insufficient documentation

## 2019-04-24 DIAGNOSIS — Z885 Allergy status to narcotic agent status: Secondary | ICD-10-CM | POA: Diagnosis not present

## 2019-04-24 DIAGNOSIS — Z888 Allergy status to other drugs, medicaments and biological substances status: Secondary | ICD-10-CM | POA: Insufficient documentation

## 2019-04-24 DIAGNOSIS — I2510 Atherosclerotic heart disease of native coronary artery without angina pectoris: Secondary | ICD-10-CM | POA: Insufficient documentation

## 2019-04-24 DIAGNOSIS — I495 Sick sinus syndrome: Secondary | ICD-10-CM | POA: Diagnosis not present

## 2019-04-24 DIAGNOSIS — I4819 Other persistent atrial fibrillation: Secondary | ICD-10-CM | POA: Insufficient documentation

## 2019-04-24 DIAGNOSIS — Z7901 Long term (current) use of anticoagulants: Secondary | ICD-10-CM | POA: Insufficient documentation

## 2019-04-24 DIAGNOSIS — Z951 Presence of aortocoronary bypass graft: Secondary | ICD-10-CM | POA: Insufficient documentation

## 2019-04-24 DIAGNOSIS — Z8249 Family history of ischemic heart disease and other diseases of the circulatory system: Secondary | ICD-10-CM | POA: Diagnosis not present

## 2019-04-24 DIAGNOSIS — D6869 Other thrombophilia: Secondary | ICD-10-CM | POA: Insufficient documentation

## 2019-04-24 DIAGNOSIS — I5022 Chronic systolic (congestive) heart failure: Secondary | ICD-10-CM | POA: Diagnosis not present

## 2019-04-24 LAB — CBC
HCT: 33.2 % — ABNORMAL LOW (ref 36.0–46.0)
Hemoglobin: 10.5 g/dL — ABNORMAL LOW (ref 12.0–15.0)
MCH: 33.8 pg (ref 26.0–34.0)
MCHC: 31.6 g/dL (ref 30.0–36.0)
MCV: 106.8 fL — ABNORMAL HIGH (ref 80.0–100.0)
Platelets: 239 10*3/uL (ref 150–400)
RBC: 3.11 MIL/uL — ABNORMAL LOW (ref 3.87–5.11)
RDW: 16.5 % — ABNORMAL HIGH (ref 11.5–15.5)
WBC: 5.5 10*3/uL (ref 4.0–10.5)
nRBC: 0.4 % — ABNORMAL HIGH (ref 0.0–0.2)

## 2019-04-24 LAB — BASIC METABOLIC PANEL
Anion gap: 8 (ref 5–15)
BUN: 35 mg/dL — ABNORMAL HIGH (ref 8–23)
CO2: 27 mmol/L (ref 22–32)
Calcium: 9 mg/dL (ref 8.9–10.3)
Chloride: 106 mmol/L (ref 98–111)
Creatinine, Ser: 1.58 mg/dL — ABNORMAL HIGH (ref 0.44–1.00)
GFR calc Af Amer: 32 mL/min — ABNORMAL LOW (ref >60)
GFR calc non Af Amer: 28 mL/min — ABNORMAL LOW (ref >60)
Glucose, Bld: 111 mg/dL — ABNORMAL HIGH (ref 70–99)
Potassium: 4.6 mmol/L (ref 3.5–5.1)
Sodium: 141 mmol/L (ref 135–145)

## 2019-04-24 MED ORDER — AMIODARONE HCL 200 MG PO TABS: ORAL_TABLET | ORAL | 0 refills | Status: DC

## 2019-04-24 NOTE — Telephone Encounter (Signed)
Scheduled pre-cardioversion COVID19 screening test on 3/13 @ 11am. Son is aware of testing site and process, as patient has been tested at [facility] previously.

## 2019-04-24 NOTE — Addendum Note (Signed)
Encounter addended by: Sanda Klein, MD on: 04/24/2019 1:04 PM  Actions taken: Clinical Note Signed, Problem List modified

## 2019-04-24 NOTE — Progress Notes (Signed)
Comprehensive device check shows normal function. Persistent AFib since 04/20/2019. Mostly V paced. No RVR due to second degree AV block. Atrial electrogram shows atrial flutter with cycle length 260 ms. Burst overdrive pacing was attempted with CL decrementing from 240 ms, 230 ms, 220 ms, 200 ms and finally 180 ms. Entrainment appeared to occur, but the flutter continued unabated. After the final attempt at 180 ms, the rhythm deteriorated to atrial fibrillation. The arrhythmia appears to be symptomatic with increased weakness, dyspnea and ankle edema. She improved following CV for persistent AFib 2 months ago. Will schedule for DCCV next week, start amiodarone 400 mg PO daily, with plan to reduce to 200 mg daily quickly in view of her small body size. This procedure has been fully reviewed with the patient and her son and informed consent has been obtained. Marland Kitchen

## 2019-04-24 NOTE — Patient Instructions (Signed)
Start Amiodarone 200mg  twice a day (with food) for 1 week then reduce to 1 tablet a day  Dr. Loletha Grayer office will call you with instructions of cardioversion and follow up.

## 2019-04-24 NOTE — H&P (View-Only) (Signed)
Primary Care Physician: Gayland Curry, DO Primary Cardiologist: Dr Sallyanne Kuster Primary Electrophysiologist: none Referring Physician: Dr Hazle Quant is a 86 y.o. female with a history of PAD, CAD s/p CABG, s/p AVR, SSS s/p PPM, chronic systolic CHF, orthostatic hypotension, and persistent atrial fibrillation who presents for consultation in the [facility]. Patient is on Eliquis for a CHADS2VASC score of 5. Device clinic received an alert for ongoing afib episode. Patient seen with Dr Sallyanne Kuster in office to attempt NIPS. Patient does have some increased SOB but no orthopnea. She does have some chronic ankle swelling. She is maybe 1-2 lbs up from her dry weight.  Today, she denies symptoms of palpitations, chest pain, shortness of breath, orthopnea, PND, lower extremity edema, dizziness, presyncope, syncope, snoring, daytime somnolence, bleeding, or neurologic sequela. The patient is tolerating medications without difficulties and is otherwise without complaint today.    Atrial Fibrillation Risk Factors:  she does not have symptoms or diagnosis of sleep apnea. she does have a history of rheumatic fever.   she has a BMI of Body mass index is 16.06 kg/m.Marland Kitchen Filed Weights   04/24/19 1111  Weight: 39.8 kg    Family History  Problem Relation Age of Onset  . Heart attack Mother   . Heart attack Father   . Heart attack Brother   . Hyperlipidemia Sister   . Hypertension Sister      Atrial Fibrillation Management history:  Previous antiarrhythmic drugs: none Previous cardioversions: 02/10/19 Previous ablations: none CHADS2VASC score: 5 Anticoagulation history: Eliquis   Past Medical History:  Diagnosis Date  . Aneurysm (arteriovenous) of coronary vessels    ascending aorta requiring bypass of the LAD  . Carotid stenosis 04/28/08   Doppler: <40% stenosis bilateral  . Chronic kidney disease, stage 3   . Hyperlipemia   . Hypertension   .  Hypertensive chronic kidney disease   . Hypothyroidism, unspecified 02/13/2018   Symptoms well controlled with current therapy  . Mild cognitive impairment, so stated 03/28/2018   Symptoms poorly controlled, needs frequent adjustments in treatment and dose monitoring  . Mild intermittent asthma    Symptoms controlled with difficulty, affecting daily functioning, needs ongoing monitoring  . Occlusion and stenosis of bilateral carotid arteries   . Pacemaker generator end of life 11/18/08   Intermittent high-grade atrioventricular block  . Peripheral arterial disease (HCC)    left ABI of 0.78  . Presence of permanent cardiac pacemaker 08/26/01   Sinus node dysfunction-[facility]  . Presence of xenogenic heart valve   . PVD (peripheral vascular disease) ([place])   . SSS (sick sinus syndrome) ([place])   . Status post ascending aortic aneurysm repair/AVR -  Medtronic Freestyle root 09/28/2014   ascending aorta requiring bypass of the LAD    Past Surgical History:  Procedure Laterality Date  . ABDOMINAL HYSTERECTOMY  1966  . APPENDECTOMY  1943  . ASCENDING AORTIC ANEURYSM REPAIR  09/22/97   porcine aortic root  . BREAST FIBROADENOMA SURGERY  11/89  . CARDIAC SURGERY  09/22/1997   aortic valve root repair with porcine at time of CABG  . CARDIOVERSION N/A 02/10/2019   Procedure: CARDIOVERSION;  Surgeon: Skeet Latch, MD;  Location: [facility];  Service: Cardiovascular;  Laterality: N/A;  . CORONARY ARTERY BYPASS GRAFT  09/22/97   LIMA to the LAD  . I & D EXTREMITY Left 08/15/2017   Procedure: IRRIGATION AND DEBRIDEMENT EXTREMITY;  Surgeon: Nicholes Stairs, MD;  Location:  [facility];  Service: Orthopedics;  Laterality: Left;  . [place]   left  . NEPHRECTOMY  1958   right  . ORIF HUMERUS FRACTURE Left 08/15/2017   Procedure: OPEN REDUCTION INTERNAL FIXATION (ORIF) PROXIMAL HUMERUS FRACTURE;  Surgeon: Nicholes Stairs, MD;  Location: [facility];  Service: Orthopedics;   Laterality: Left;  . OVARIAN CYST SURGERY  1948  . PACEMAKER GENERATOR CHANGE  11/18/08   [facility]  . PACEMAKER INSERTION  08/26/01   [facility]  . PPM GENERATOR CHANGEOUT N/A 05/16/2017   Procedure: PPM GENERATOR CHANGEOUT;  Surgeon: Sanda Klein, MD;  Location: [facility];  Service: Cardiovascular;  Laterality: N/A;    Current Outpatient Medications  Medication Sig Dispense Refill  . acetaminophen (TYLENOL) 500 MG tablet Take 500 mg by mouth every 6 (six) hours as needed for mild pain or headache.    . albuterol (ACCUNEB) 0.63 MG/3ML nebulizer solution Take 3 mLs (0.63 mg total) by nebulization every 6 (six) hours as needed for wheezing. 75 mL 1  . apixaban (ELIQUIS) 2.5 MG TABS tablet Take 1 tablet (2.5 mg total) by mouth 2 (two) times daily. 60 tablet 11  . brimonidine (ALPHAGAN) 0.2 % ophthalmic solution Place 1 drop into both eyes 2 (two) times daily.   2  . carvedilol (COREG) 3.125 MG tablet TAKE ONE TABLET BY MOUTH TWICE A DAY WITH FOOD 180 tablet 1  . Cholecalciferol (VITAMIN D) 50 MCG (2000 UT) CAPS Take 1 capsule by mouth daily.    . Coenzyme Q10 (COQ10) 200 MG CAPS Take 200 mg by mouth daily.    . furosemide (LASIX) 20 MG tablet Take 1 tablet (20 mg total) by mouth daily. 30 tablet 1  . vitamin B-12 (CYANOCOBALAMIN) 500 MCG tablet Take 500 mcg by mouth daily.    Marland Kitchen amiodarone (PACERONE) 200 MG tablet Take 1 tablet by mouth twice a day for 1 week then reduce to 1 tablet daily 60 tablet 0   No current facility-administered medications for this encounter.    Allergies  Allergen Reactions  . Accupril [Quinapril Hcl] Other (See Comments)    Unknown reaction   . Amlodipine Swelling  . Benadryl [Diphenhydramine Hcl] Other (See Comments)    Unknown reaction   . Biaxin [Clarithromycin] Other (See Comments)    Unknown reaction   . Ciprofloxacin Other (See Comments)    Unknown reaction   . Codeine Other (See Comments)    Unknown reaction   . Diovan [Valsartan] Other (See  Comments)    Unknown reaction   . Medrol [Methylprednisolone] Other (See Comments)    Unknown  . Morphine And Related Other (See Comments)    Unknown reaction   . Neurontin [Gabapentin] Other (See Comments)    Unknown reaction   . Penicillins     Unknown reaction  Has patient had a PCN reaction causing immediate rash, facial/tongue/throat swelling, SOB or lightheadedness with hypotension: Unknown Has patient had a PCN reaction causing severe rash involving mucus membranes or skin necrosis: Unknown Has patient had a PCN reaction that required hospitalization: Unknown Has patient had a PCN reaction occurring within the last 10 years: No If all of the above answers are "NO", then may proceed with Cephalosporin use.     Social History   Socioeconomic History  . Marital status: Widowed    Spouse name: Not on file  . Number of children: 1  . Years of education: 59  . Highest education level: High school graduate  Occupational History  . 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 Smoking status: Never Smoker  . Smokeless tobacco: Never Used  Substance and Sexual Activity  . Alcohol use: No  . Drug use: No  . Sexual activity: Not Currently  Other Topics Concern  . Not on file  Social History Narrative   Lives with son, Algis Greenhouse   Caffeine use: 1 cup coffee per day   Right handed   Exercises at home with theraband, stationary bike and walking   Social Determinants of Health   Financial Resource Strain:   . Difficulty of Paying Living Expenses:   Food Insecurity:   . Worried About Charity fundraiser in the Last Year:   . Arboriculturist in the Last Year:   Transportation Needs:   . Film/video editor (Medical):   Marland Kitchen Lack of Transportation (Non-Medical):   Physical Activity:   . Days of Exercise per Week:   . Minutes of Exercise per Session:   Stress:   . Feeling of Stress :   Social Connections:   . Frequency of Communication with Friends  and Family:   . Frequency of Social Gatherings with Friends and Family:   . Attends Religious Services:   . Active Member of Clubs or Organizations:   . Attends Archivist Meetings:   Marland Kitchen Marital Status:   Intimate Partner Violence:   . Fear of Current or Ex-Partner:   . Emotionally Abused:   Marland Kitchen Physically Abused:   . Sexually Abused:      ROS- All systems are reviewed and negative except as per the HPI above.  Physical Exam: Vitals:   04/24/19 1111  BP: (!) 150/80  Pulse: 70  Weight: 39.8 kg  Height: 5\' 2"  (1.575 m)    GEN- The patient is well appearing elderly female, alert and oriented x 3 today.   Head- normocephalic, atraumatic Eyes-  Sclera clear, conjunctiva pink Ears- hearing intact Oropharynx- clear Neck- supple  Lungs- Clear to ausculation bilaterally, normal work of breathing Heart- Regular rate and rhythm, no murmurs, rubs or gallops  GI- soft, NT, ND, + BS Extremities- no clubbing, cyanosis. 1+ bilateral edema MS- no significant deformity or atrophy Skin- no rash or lesion Psych- euthymic mood, full affect Neuro- strength and sensation are intact  Wt Readings from Last 3 Encounters:  04/24/19 39.8 kg  04/17/19 39.9 kg  04/10/19 40.3 kg    EKG today demonstrates V paced rhythm HR 70, QRS 168, QTc 509  Echo 01/24/19 demonstrated  1. Left ventricular ejection fraction, by visual estimation, is 30 to  35%. The left ventricle has normal function. There is moderately increased  left ventricular hypertrophy.  2. Left ventricular diastolic parameters are consistent with Grade II  diastolic dysfunction (pseudonormalization).  3. The left ventricle has no regional wall motion abnormalities.  4. Global right ventricle has normal systolic function.The right  ventricular size is normal. No increase in right ventricular wall  thickness.  5. Left atrial size was severely dilated.  6. Right atrial size was mildly dilated.  7. The mitral valve is  grossly normal. Mild mitral valve regurgitation.  8. The tricuspid valve is grossly normal. Tricuspid valve regurgitation  moderate.  9. The aortic valve is grossly normal. Aortic valve regurgitation is  trivial. Mild aortic valve stenosis.  10. The pulmonic valve was grossly normal. Pulmonic valve regurgitation is  trivial.  11. Mildly elevated pulmonary artery systolic pressure.  12. A pacer wire is visualized.  52. The atrial septum is grossly normal.   Epic records are reviewed at length today  CHA2DS2-VASc Score = 5 The patient's score is based upon: CHF History: Yes HTN History: No Age : 30 + Diabetes History: No Stroke History: No Vascular Disease History: Yes Gender: Female      ASSESSMENT AND PLAN: 1. Persistent Atrial Fibrillation/atrial flutter The patient's CHA2DS2-VASc score is 5, indicating a 7.2% annual risk of stroke.   NIPS attempted which was unsuccessful.  Dr Lurline Del office to arrange DCCV next week.  Per Dr C, will also start amiodarone 200 mg BID until DCCV then decrease to 200 mg daily.  Check Bmet/CBC today.  Continue Eliquis 2.5 mg BID. Patient reports no missed doses of anticoagulation. Continue Coreg 3.125 mg BID  2. Secondary Hypercoagulable State (ICD10:  D68.69) The patient is at significant risk for stroke/thromboembolism based upon her CHA2DS2-VASc Score of 5.  Continue Apixaban (Eliquis).   3. CAD S/p CABG No anginal symptoms. Followed by Dr Sallyanne Kuster  4. Chronic systolic CHF Patient up 1-2 lbs. Will not increase lasix at this time but instructions given to family to double lasix if her weight is >87 lbs.  Repeat echo scheduled.   5. Sinus node dysfunction S/p PPM, followed by Dr Sallyanne Kuster and device clinic.   Follow up after DCCV with Dr Sallyanne Kuster.    [facility] [address] [place], [place] [address] [phone] 04/24/2019 11:52 AM

## 2019-04-24 NOTE — Progress Notes (Signed)
Primary Care Physician: Gayland Curry, DO Primary Cardiologist: Dr Sallyanne Kuster Primary Electrophysiologist: none Referring Physician: Dr Hazle Quant is a 86 y.o. female with a history of PAD, CAD s/p CABG, s/p AVR, SSS s/p PPM, chronic systolic CHF, orthostatic hypotension, and persistent atrial fibrillation who presents for consultation in the [facility]. Patient is on Eliquis for a CHADS2VASC score of 5. Device clinic received an alert for ongoing afib episode. Patient seen with Dr Sallyanne Kuster in office to attempt NIPS. Patient does have some increased SOB but no orthopnea. She does have some chronic ankle swelling. She is maybe 1-2 lbs up from her dry weight.  Today, she denies symptoms of palpitations, chest pain, shortness of breath, orthopnea, PND, lower extremity edema, dizziness, presyncope, syncope, snoring, daytime somnolence, bleeding, or neurologic sequela. The patient is tolerating medications without difficulties and is otherwise without complaint today.    Atrial Fibrillation Risk Factors:  she does not have symptoms or diagnosis of sleep apnea. she does have a history of rheumatic fever.   she has a BMI of Body mass index is 16.06 kg/m.Marland Kitchen Filed Weights   04/24/19 1111  Weight: 39.8 kg    Family History  Problem Relation Age of Onset   Heart attack Mother    Heart attack Father    Heart attack Brother    Hyperlipidemia Sister    Hypertension Sister      Atrial Fibrillation Management history:  Previous antiarrhythmic drugs: none Previous cardioversions: 02/10/19 Previous ablations: none CHADS2VASC score: 5 Anticoagulation history: Eliquis   Past Medical History:  Diagnosis Date   Aneurysm (arteriovenous) of coronary vessels    ascending aorta requiring bypass of the LAD   Carotid stenosis 04/28/08   Doppler: <40% stenosis bilateral   Chronic kidney disease, stage 3    Hyperlipemia    Hypertension     Hypertensive chronic kidney disease    Hypothyroidism, unspecified 02/13/2018   Symptoms well controlled with current therapy   Mild cognitive impairment, so stated 03/28/2018   Symptoms poorly controlled, needs frequent adjustments in treatment and dose monitoring   Mild intermittent asthma    Symptoms controlled with difficulty, affecting daily functioning, needs ongoing monitoring   Occlusion and stenosis of bilateral carotid arteries    Pacemaker generator end of life 11/18/08   Intermittent high-grade atrioventricular block   Peripheral arterial disease (HCC)    left ABI of 0.78   Presence of permanent cardiac pacemaker 08/26/01   Sinus node dysfunction-[facility]   Presence of xenogenic heart valve    PVD (peripheral vascular disease) (HCC)    SSS (sick sinus syndrome) ([place])    Status post ascending aortic aneurysm repair/AVR -  Medtronic Freestyle root 09/28/2014   ascending aorta requiring bypass of the LAD    Past Surgical History:  Procedure Laterality Date   ABDOMINAL HYSTERECTOMY  1966   APPENDECTOMY  1943   ASCENDING AORTIC ANEURYSM REPAIR  09/22/97   porcine aortic root   BREAST FIBROADENOMA SURGERY  11/89   CARDIAC SURGERY  09/22/1997   aortic valve root repair with porcine at time of CABG   CARDIOVERSION N/A 02/10/2019   Procedure: CARDIOVERSION;  Surgeon: Skeet Latch, MD;  Location: [facility];  Service: Cardiovascular;  Laterality: N/A;   CORONARY ARTERY BYPASS GRAFT  09/22/97   LIMA to the LAD   I & D EXTREMITY Left 08/15/2017   Procedure: IRRIGATION AND DEBRIDEMENT EXTREMITY;  Surgeon: Nicholes Stairs, MD;  Location:  [facility];  Service: Orthopedics;  Laterality: Left;   [place]   left   NEPHRECTOMY  1958   right   ORIF HUMERUS FRACTURE Left 08/15/2017   Procedure: OPEN REDUCTION INTERNAL FIXATION (ORIF) PROXIMAL HUMERUS FRACTURE;  Surgeon: Nicholes Stairs, MD;  Location: [facility];  Service: Orthopedics;   Laterality: Left;   OVARIAN CYST SURGERY  1948   PACEMAKER GENERATOR CHANGE  11/18/08   [facility]   PACEMAKER INSERTION  08/26/01   [facility]   PPM GENERATOR CHANGEOUT N/A 05/16/2017   Procedure: PPM GENERATOR CHANGEOUT;  Surgeon: Sanda Klein, MD;  Location: [facility];  Service: Cardiovascular;  Laterality: N/A;    Current Outpatient Medications  Medication Sig Dispense Refill   acetaminophen (TYLENOL) 500 MG tablet Take 500 mg by mouth every 6 (six) hours as needed for mild pain or headache.     albuterol (ACCUNEB) 0.63 MG/3ML nebulizer solution Take 3 mLs (0.63 mg total) by nebulization every 6 (six) hours as needed for wheezing. 75 mL 1   apixaban (ELIQUIS) 2.5 MG TABS tablet Take 1 tablet (2.5 mg total) by mouth 2 (two) times daily. 60 tablet 11   brimonidine (ALPHAGAN) 0.2 % ophthalmic solution Place 1 drop into both eyes 2 (two) times daily.   2   carvedilol (COREG) 3.125 MG tablet TAKE ONE TABLET BY MOUTH TWICE A DAY WITH FOOD 180 tablet 1   Cholecalciferol (VITAMIN D) 50 MCG (2000 UT) CAPS Take 1 capsule by mouth daily.     Coenzyme Q10 (COQ10) 200 MG CAPS Take 200 mg by mouth daily.     furosemide (LASIX) 20 MG tablet Take 1 tablet (20 mg total) by mouth daily. 30 tablet 1   vitamin B-12 (CYANOCOBALAMIN) 500 MCG tablet Take 500 mcg by mouth daily.     amiodarone (PACERONE) 200 MG tablet Take 1 tablet by mouth twice a day for 1 week then reduce to 1 tablet daily 60 tablet 0   No current facility-administered medications for this encounter.    Allergies  Allergen Reactions   Accupril [Quinapril Hcl] Other (See Comments)    Unknown reaction    Amlodipine Swelling   Benadryl [Diphenhydramine Hcl] Other (See Comments)    Unknown reaction    Biaxin [Clarithromycin] Other (See Comments)    Unknown reaction    Ciprofloxacin Other (See Comments)    Unknown reaction    Codeine Other (See Comments)    Unknown reaction    Diovan [Valsartan] Other (See  Comments)    Unknown reaction    Medrol [Methylprednisolone] Other (See Comments)    Unknown   Morphine And Related Other (See Comments)    Unknown reaction    Neurontin [Gabapentin] Other (See Comments)    Unknown reaction    Penicillins     Unknown reaction  Has patient had a PCN reaction causing immediate rash, facial/tongue/throat swelling, SOB or lightheadedness with hypotension: Unknown Has patient had a PCN reaction causing severe rash involving mucus membranes or skin necrosis: Unknown Has patient had a PCN reaction that required hospitalization: Unknown Has patient had a PCN reaction occurring within the last 10 years: No If all of the above answers are "NO", then may proceed with Cephalosporin use.     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  Tobacco Use   Smoking status: Never Smoker   Smokeless tobacco: Never Used  Substance and Sexual Activity   Alcohol use: No   Drug use: No   Sexual activity: Not Currently  Other Topics Concern   Not on file  Social History Narrative   Lives with son, Algis Greenhouse   Caffeine use: 1 cup coffee per day   Right handed   Exercises at home with theraband, stationary bike and walking   Social Determinants of Health   Financial Resource Strain:    Difficulty of Paying Living Expenses:   Food Insecurity:    Worried About Charity fundraiser in the Last Year:    Arboriculturist in the Last Year:   Transportation Needs:    Film/video editor (Medical):    Lack of Transportation (Non-Medical):   Physical Activity:    Days of Exercise per Week:    Minutes of Exercise per Session:   Stress:    Feeling of Stress :   Social Connections:    Frequency of Communication with Friends  and Family:    Frequency of Social Gatherings with Friends and Family:    Attends Religious Services:    Active Member of Clubs or Organizations:    Attends Music therapist:    Marital Status:   Intimate Partner Violence:    Fear of Current or Ex-Partner:    Emotionally Abused:    Physically Abused:    Sexually Abused:      ROS- All systems are reviewed and negative except as per the HPI above.  Physical Exam: Vitals:   04/24/19 1111  BP: (!) 150/80  Pulse: 70  Weight: 39.8 kg  Height: 5\' 2"  (1.575 m)    GEN- The patient is well appearing elderly female, alert and oriented x 3 today.   Head- normocephalic, atraumatic Eyes-  Sclera clear, conjunctiva pink Ears- hearing intact Oropharynx- clear Neck- supple  Lungs- Clear to ausculation bilaterally, normal work of breathing Heart- Regular rate and rhythm, no murmurs, rubs or gallops  GI- soft, NT, ND, + BS Extremities- no clubbing, cyanosis. 1+ bilateral edema MS- no significant deformity or atrophy Skin- no rash or lesion Psych- euthymic mood, full affect Neuro- strength and sensation are intact  Wt Readings from Last 3 Encounters:  04/24/19 39.8 kg  04/17/19 39.9 kg  04/10/19 40.3 kg    EKG today demonstrates V paced rhythm HR 70, QRS 168, QTc 509  Echo 01/24/19 demonstrated  1. Left ventricular ejection fraction, by visual estimation, is 30 to  35%. The left ventricle has normal function. There is moderately increased  left ventricular hypertrophy.  2. Left ventricular diastolic parameters are consistent with Grade II  diastolic dysfunction (pseudonormalization).  3. The left ventricle has no regional wall motion abnormalities.  4. Global right ventricle has normal systolic function.The right  ventricular size is normal. No increase in right ventricular wall  thickness.  5. Left atrial size was severely dilated.  6. Right atrial size was mildly dilated.  7. The mitral valve is  grossly normal. Mild mitral valve regurgitation.  8. The tricuspid valve is grossly normal. Tricuspid valve regurgitation  moderate.  9. The aortic valve is grossly normal. Aortic valve regurgitation is  trivial. Mild aortic valve stenosis.  10. The pulmonic valve was grossly normal. Pulmonic valve regurgitation is  trivial.  11. Mildly elevated pulmonary artery systolic pressure.  12. A pacer wire is visualized.  74. The atrial septum is grossly normal.   Epic records are reviewed at length today  CHA2DS2-VASc Score = 5 The patient's score is based upon: CHF History: Yes HTN History: No Age : 11 + Diabetes History: No Stroke History: No Vascular Disease History: Yes Gender: Female      ASSESSMENT AND PLAN: 1. Persistent Atrial Fibrillation/atrial flutter The patient's CHA2DS2-VASc score is 5, indicating a 7.2% annual risk of stroke.   NIPS attempted which was unsuccessful.  Dr Lurline Del office to arrange DCCV next week.  Per Dr C, will also start amiodarone 200 mg BID until DCCV then decrease to 200 mg daily.  Check Bmet/CBC today.  Continue Eliquis 2.5 mg BID. Patient reports no missed doses of anticoagulation. Continue Coreg 3.125 mg BID  2. Secondary Hypercoagulable State (ICD10:  D68.69) The patient is at significant risk for stroke/thromboembolism based upon her CHA2DS2-VASc Score of 5.  Continue Apixaban (Eliquis).   3. CAD S/p CABG No anginal symptoms. Followed by Dr Sallyanne Kuster  4. Chronic systolic CHF Patient up 1-2 lbs. Will not increase lasix at this time but instructions given to family to double lasix if her weight is >87 lbs.  Repeat echo scheduled.   5. Sinus node dysfunction S/p PPM, followed by Dr Sallyanne Kuster and device clinic.   Follow up after DCCV with Dr Sallyanne Kuster.    [facility] [address] [place], [place] [address] [phone] 04/24/2019 11:52 AM

## 2019-04-25 ENCOUNTER — Other Ambulatory Visit: Payer: Self-pay | Admitting: Cardiovascular Disease

## 2019-04-26 ENCOUNTER — Other Ambulatory Visit (HOSPITAL_COMMUNITY)
Admission: RE | Admit: 2019-04-26 | Discharge: 2019-04-26 | Disposition: A | Discharge status: Home / Self Care | Payer: Medicare Other | Source: Ambulatory Visit | Attending: Cardiovascular Disease | Admitting: Cardiovascular Disease

## 2019-04-26 DIAGNOSIS — Z01812 Encounter for preprocedural laboratory examination: Secondary | ICD-10-CM | POA: Insufficient documentation

## 2019-04-26 DIAGNOSIS — Z20822 Contact with and (suspected) exposure to covid-19: Secondary | ICD-10-CM | POA: Insufficient documentation

## 2019-04-26 LAB — SARS CORONAVIRUS 2 (TAT 6-24 HRS): SARS Coronavirus 2: NEGATIVE

## 2019-04-30 ENCOUNTER — Encounter (HOSPITAL_COMMUNITY)
Admission: RE | Disposition: A | Discharge status: Home / Self Care | Payer: Self-pay | Source: Home / Self Care | Attending: Cardiovascular Disease

## 2019-04-30 ENCOUNTER — Encounter (HOSPITAL_COMMUNITY): Payer: Self-pay | Admitting: Anesthesiology

## 2019-04-30 ENCOUNTER — Ambulatory Visit (HOSPITAL_COMMUNITY)
Admission: RE | Admit: 2019-04-30 | Discharge: 2019-04-30 | Disposition: A | Discharge status: Home / Self Care | Payer: Medicare Other | Attending: Cardiovascular Disease | Admitting: Cardiovascular Disease

## 2019-04-30 DIAGNOSIS — Z7901 Long term (current) use of anticoagulants: Secondary | ICD-10-CM | POA: Insufficient documentation

## 2019-04-30 DIAGNOSIS — I442 Atrioventricular block, complete: Secondary | ICD-10-CM

## 2019-04-30 DIAGNOSIS — Z538 Procedure and treatment not carried out for other reasons: Secondary | ICD-10-CM | POA: Insufficient documentation

## 2019-04-30 DIAGNOSIS — I4819 Other persistent atrial fibrillation: Secondary | ICD-10-CM | POA: Insufficient documentation

## 2019-04-30 SURGERY — CANCELLED PROCEDURE

## 2019-04-30 NOTE — H&P (View-Only) (Signed)
Pacemaker check reviewed. Converted to sinus rhythm early AM of 02/26/2019. No atrial fibrillation since then. Normal device function. Unclear if there is any change in symptoms/functional status.  Will reduce the amio to 200 mg daily today and will reassess in clinic in 1 month.

## 2019-04-30 NOTE — Progress Notes (Signed)
Pacemaker check reviewed. Converted to sinus rhythm early AM of 02/26/2019. No atrial fibrillation since then. Normal device function. Unclear if there is any change in symptoms/functional status.  Will reduce the amio to 200 mg daily today and will reassess in clinic in 1 month.

## 2019-04-30 NOTE — Interval H&P Note (Signed)
History and Physical Interval Note:  04/30/2019 1:13 PM  Holly Bolton  has presented today for surgery, with the diagnosis of afib.  The various methods of treatment have been discussed with the patient and family. After consideration of risks, benefits and other options for treatment, the patient has consented to  Procedure(s): CANCELLED PROCEDURE as a surgical intervention.  The patient's history has been reviewed, patient examined, no change in status, stable for surgery.  I have reviewed the patient's chart and labs.  Questions were answered to the patient's satisfaction.      

## 2019-04-30 NOTE — Anesthesia Preprocedure Evaluation (Deleted)
Anesthesia Evaluation    Reviewed: Allergy & Precautions, Patient's Chart, lab work & pertinent test results, reviewed documented beta blocker date and time   History of Anesthesia Complications Negative for: history of anesthetic complications  Airway        Dental   Pulmonary asthma ,           Cardiovascular hypertension, Pt. on home beta blockers and Pt. on medications + CAD, + CABG, + Peripheral Vascular Disease and +CHF  + dysrhythmias Atrial Fibrillation + pacemaker + Valvular Problems/Murmurs (s/p AVR)    '20 TTE - EF 30 to 35%. Moderately increased LVH. Grade II diastolic dysfunction (pseudonormalization). LA was severely dilated. RA was mildly dilated. Mild MR. Moderate TR. Trivial AI, mild AS. Trivial PR. Mildly elevated pulmonary artery systolic pressure. A pacer wire is visualized.    Neuro/Psych negative neurological ROS  negative psych ROS   GI/Hepatic negative GI ROS, Neg liver ROS,   Endo/Other  Hypothyroidism   Renal/GU CRFRenal disease     Musculoskeletal negative musculoskeletal ROS (+)   Abdominal   Peds  Hematology  (+) anemia ,   Anesthesia Other Findings Covid neg 04/26/19    Reproductive/Obstetrics                             Anesthesia Physical  Anesthesia Plan  ASA: III  Anesthesia Plan: General   Post-op Pain Management:    Induction: Intravenous  PONV Risk Score and Plan: 3 and Treatment may vary due to age or medical condition and Propofol infusion  Airway Management Planned: Mask and Natural Airway  Additional Equipment: None  Intra-op Plan:   Post-operative Plan:   Informed Consent:   Plan Discussed with: CRNA and Anesthesiologist  Anesthesia Plan Comments:         Anesthesia Quick Evaluation

## 2019-04-30 NOTE — Progress Notes (Signed)
Pt arrived for cardioversion, found to be in SR/paced rhythm.  Transmission sent to [facility] for verification.  Dr. Sallyanne Kuster notified, came to bedside.  Pt discharged home with son.  Vista Lawman, RN

## 2019-04-30 NOTE — Interval H&P Note (Signed)
History and Physical Interval Note:  04/30/2019 1:14 PM  Holly Bolton  has presented today for surgery, with the diagnosis of afib.  The various methods of treatment have been discussed with the patient and family. After consideration of risks, benefits and other options for treatment, the patient has consented to  Procedure(s): CANCELLED PROCEDURE as a surgical intervention.  The patient's history has been reviewed, patient examined, no change in status, stable for surgery.  I have reviewed the patient's chart and labs.  Questions were answered to the patient's satisfaction.     Huntleigh Doolen

## 2019-05-20 ENCOUNTER — Telehealth: Payer: Self-pay | Admitting: Cardiovascular Disease

## 2019-05-20 NOTE — Telephone Encounter (Signed)
Pt c/o medication issue:  1. Name of Medication: Eliquis   2. How are you currently taking this medication (dosage and times per day)? Once a day  3. Are you having a reaction (difficulty breathing--STAT)? No   4. What is your medication issue? Patient is having some bruising and pain.

## 2019-05-20 NOTE — Telephone Encounter (Signed)
Spoke with patient's son per DPR. He reports patient has continuing bruising at pain at the bruising site in her lower extremities. He reports patient had a fall a week ago which opened up one of her bruises and it began to bleed like Koolaid. Patient was off of Eliquis for 2 days per son. Son reports he was told by Dr. Sallyanne Kuster patient could have a break for a few days and then to resume. Patient is since back on Eliquis. Son would like bruising evaluated and Eliquis dosage to determine if patient may be able to discontinue medication. Patient placed on schedule with Coletta Memos, NP.

## 2019-05-22 NOTE — Progress Notes (Addendum)
Cardiology Clinic Note   Patient Name: TIEASHA LARSEN Date of Encounter: 05/23/2019  Primary Care Provider:  Gayland Curry, DO Primary Cardiologist:  Sanda Klein, MD  Patient Profile    Nahia L. Grasse 86 year old female presents today for follow-up evaluation of her Eliquis and bruising.  Past Medical History    Past Medical History:  Diagnosis Date  . Aneurysm (arteriovenous) of coronary vessels    ascending aorta requiring bypass of the LAD  . Carotid stenosis 04/28/08   Doppler: <40% stenosis bilateral  . Chronic kidney disease, stage 3   . Hyperlipemia   . Hypertension   . Hypertensive chronic kidney disease   . Hypothyroidism, unspecified 02/13/2018   Symptoms well controlled with current therapy  . Mild cognitive impairment, so stated 03/28/2018   Symptoms poorly controlled, needs frequent adjustments in treatment and dose monitoring  . Mild intermittent asthma    Symptoms controlled with difficulty, affecting daily functioning, needs ongoing monitoring  . Occlusion and stenosis of bilateral carotid arteries   . Pacemaker generator end of life 11/18/08   Intermittent high-grade atrioventricular block  . Peripheral arterial disease (HCC)    left ABI of 0.78  . Presence of permanent cardiac pacemaker 08/26/01   Sinus node dysfunction-[facility]  . Presence of xenogenic heart valve   . PVD (peripheral vascular disease) ([place])   . SSS (sick sinus syndrome) ([place])   . Status post ascending aortic aneurysm repair/AVR -  Medtronic Freestyle root 09/28/2014   ascending aorta requiring bypass of the LAD    Past Surgical History:  Procedure Laterality Date  . ABDOMINAL HYSTERECTOMY  1966  . APPENDECTOMY  1943  . ASCENDING AORTIC ANEURYSM REPAIR  09/22/97   porcine aortic root  . BREAST FIBROADENOMA SURGERY  11/89  . CARDIAC SURGERY  09/22/1997   aortic valve root repair with porcine at time of CABG  . CARDIOVERSION N/A 02/10/2019   Procedure: CARDIOVERSION;  Surgeon:  Skeet Latch, MD;  Location: [place];  Service: Cardiovascular;  Laterality: N/A;  . CORONARY ARTERY BYPASS GRAFT  09/22/97   LIMA to the LAD  . I & D EXTREMITY Left 08/15/2017   Procedure: IRRIGATION AND DEBRIDEMENT EXTREMITY;  Surgeon: Nicholes Stairs, MD;  Location: [place];  Service: Orthopedics;  Laterality: Left;  . [place]   left  . NEPHRECTOMY  1958   right  . ORIF HUMERUS FRACTURE Left 08/15/2017   Procedure: OPEN REDUCTION INTERNAL FIXATION (ORIF) PROXIMAL HUMERUS FRACTURE;  Surgeon: Nicholes Stairs, MD;  Location: [place];  Service: Orthopedics;  Laterality: Left;  . OVARIAN CYST SURGERY  1948  . PACEMAKER GENERATOR CHANGE  11/18/08   [facility]  . PACEMAKER INSERTION  08/26/01   [employer]  . PPM GENERATOR CHANGEOUT N/A 05/16/2017   Procedure: PPM GENERATOR CHANGEOUT;  Surgeon: Sanda Klein, MD;  Location: [facility];  Service: Cardiovascular;  Laterality: N/A;    Allergies  Allergies  Allergen Reactions  . Accupril [Quinapril Hcl] Other (See Comments)    Unknown reaction   . Amlodipine Swelling  . Benadryl [Diphenhydramine Hcl] Other (See Comments)    Unknown reaction   . Biaxin [Clarithromycin] Other (See Comments)    Unknown reaction   . Ciprofloxacin Nausea And Vomiting  . Codeine Other (See Comments)    Unknown reaction   . Diovan [Valsartan] Other (See Comments)    Unknown reaction   . Medrol [Methylprednisolone] Other (See Comments)    Unknown  . Morphine  And Related Other (See Comments)    Unknown reaction   . Neurontin [Gabapentin] Other (See Comments)    Unknown reaction   . Penicillins     Unknown reaction  Did it involve swelling of the face/tongue/throat, SOB, or low BP? Unknown Did it involve sudden or severe rash/hives, skin peeling, or any reaction on the inside of your mouth or nose? Unknown Did you need to seek medical attention at a hospital or doctor's office? Unknown When did it last  happen?unknown If all above answers are "NO", may proceed with cephalosporin use.      History of Present Illness    Ms. Wainright has a past medical history of coronary artery disease (CABG RIMA to LAD, SSS (dual-chamber [facility] Jude pacemaker 2003/2010/2019), second-degree AV block, essential hypertension, PAD, acute on chronic diastolic CHF, persistent atrial fibrillation (on Eliquis CHA2DS2-VASc score 5), atypical atrial flutter, asthma, chronic CKD stage IV, secondary hypercoagulable state and hyperlipidemia.  She presented to the A. fib clinic on 04/24/2019.  The device clinic had received a notification of ongoing atrial fibrillation.  She was seen in the office by Dr. Sallyanne Kuster for NIPS attempt which was unsuccessful..  At that time she did not have any increase shortness of breath or orthopnea.  She did have some stable ankle edema and had a weight increase of 1 to 2 pounds.  She denied palpitations, chest pain, dizziness, syncope, and bleeding.  Dr. C coordinated a DCCV for 04/30/2019.  She presented to the hospital for cardioversion however, the procedure was canceled due to a spontaneous conversion to sinus rhythm early on 04/26/2019.  She was not noted to have recurrent atrial fibrillation since that time.  Her device was functioning normally.  Her amiodarone was reduced to 200 mg daily and she was instructed to follow-up in clinic in 1 month.  She presents to the cardiology office today for follow-up and states she feels well except for not being as conditioned as she once was.  She continues to take physical therapy 2 times per week.  Her son is concerned about multiple ankle and shin bruises.  These appear to be healing contusions.  Discussed applying pressure to areas and being very cautious about injury.  She also has some ankle edema today.  I will have her follow-up as scheduled with Dr. Loletha Grayer, order a CBC today.  Encourage low-sodium diet and encourage elevating ankles to prevent  increased edema.  Today she denies chest pain, shortness of breath, fatigue, palpitations, melena, hematuria, hemoptysis, diaphoresis, weakness, presyncope, syncope, orthopnea, and PND.    Home Medications    Prior to Admission medications   Medication Sig Start Date End Date Taking? Authorizing Provider  acetaminophen (TYLENOL) 500 MG tablet Take 500 mg by mouth every 6 (six) hours as needed for mild pain or headache.    [provider]  albuterol (ACCUNEB) 0.63 MG/3ML nebulizer solution Take 3 mLs (0.63 mg total) by nebulization every 6 (six) hours as needed for wheezing. 02/17/19   Reed, Tiffany L, DO  amiodarone (PACERONE) 200 MG tablet Take 1 tablet by mouth twice a day for 1 week then reduce to 1 tablet daily Patient taking differently: Take 200 mg by mouth See admin instructions. Take 200 mg by mouth twice a day for 1 week then reduce to 200 mg daily 04/24/19   Fenton, Clint R, PA  apixaban (ELIQUIS) 2.5 MG TABS tablet Take 1 tablet (2.5 mg total) by mouth 2 (two) times daily. 02/24/19   Croitoru,  Mihai, MD  brimonidine (ALPHAGAN) 0.2 % ophthalmic solution Place 1 drop into both eyes 2 (two) times daily.  03/31/16   [provider]  carvedilol (COREG) 3.125 MG tablet TAKE ONE TABLET BY MOUTH TWICE A DAY WITH FOOD Patient taking differently: Take 3.125 mg by mouth in the morning and at bedtime.  03/24/19   Reed, Tiffany L, DO  Cholecalciferol (VITAMIN D) 50 MCG (2000 UT) CAPS Take 1 capsule by mouth at bedtime.     [provider]  Coenzyme Q10 (COQ10) 200 MG CAPS Take 200 mg by mouth daily.    [provider]  diphenhydramine-acetaminophen (TYLENOL PM) 25-500 MG TABS tablet Take 0.5 tablets by mouth at bedtime as needed (sleep).    [provider]  furosemide (LASIX) 20 MG tablet Take 1 tablet (20 mg total) by mouth daily. 03/25/19   Reed, Tiffany L, DO  lidocaine (LMX) 4 % cream Apply 1 application topically daily as needed (pain).    [provider]  vitamin B-12 (CYANOCOBALAMIN) 500 MCG tablet Take 500 mcg by mouth daily.    [provider]    Family History    Family History  Problem Relation Age of Onset  . Heart attack Mother   . Heart attack Father   . Heart attack Brother   . Hyperlipidemia Sister   . Hypertension Sister    She indicated that her mother is deceased. She indicated that her father is deceased. She indicated that only one of her three sisters is alive. She indicated that one of her two brothers is deceased.  Social History    Social History   Socioeconomic History  . Marital status: Widowed    Spouse name: Not on file  . Number of children: 1  . Years of education: 74  . Highest education level: High school graduate  Occupational History  . 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 Smoking status: Never Smoker  . Smokeless tobacco: Never Used  Substance and Sexual Activity  . Alcohol use: No  . Drug use: No  . Sexual activity: Not Currently  Other Topics Concern  . Not on file  Social History Narrative   Lives with son, Algis Greenhouse   Caffeine use: 1 cup coffee per day   Right handed   Exercises at home with theraband, stationary bike and walking   Social Determinants of Health   Financial Resource Strain:   . Difficulty of Paying Living Expenses:   Food Insecurity:   . Worried About Charity fundraiser in the Last Year:   . Arboriculturist in the Last Year:   Transportation Needs:   . Film/video editor (Medical):   Marland Kitchen Lack of Transportation (Non-Medical):   Physical Activity:   . Days of Exercise per Week:   . Minutes of Exercise per Session:   Stress:   . Feeling of Stress :   Social Connections:   . Frequency of Communication with Friends and Family:   . Frequency of Social Gatherings with Friends and Family:   . Attends Religious Services:   . Active Member of Clubs or Organizations:   . Attends Archivist Meetings:    Marland Kitchen Marital Status:   Intimate Partner Violence:   . Fear of Current or Ex-Partner:   . Emotionally Abused:   Marland Kitchen Physically Abused:   . Sexually Abused:      Review of Systems    General:  No chills, fever, night sweats or weight changes.  Cardiovascular:  No chest pain, dyspnea on exertion, edema, orthopnea, palpitations, paroxysmal nocturnal dyspnea. Dermatological: No rash, lesions/masses Respiratory: No cough, dyspnea Urologic: No hematuria, dysuria Abdominal:   No nausea, vomiting, diarrhea, bright red blood per rectum, melena, or hematemesis Neurologic:  No visual changes, wkns, changes in mental status. All other systems reviewed and are otherwise negative except as noted above.  Physical Exam    VS:  BP (!) 122/45   Pulse 60   Temp (!) 97.2 F (36.2 C)   Ht 5\' 3"  (1.6 m)   Wt 88 lb (39.9 kg)   SpO2 100%   BMI 15.59 kg/m  , BMI Body mass index is 15.59 kg/m. GEN: Well nourished, well developed, in no acute distress. HEENT: normal. Neck: Supple, no JVD, carotid bruits, or masses. Cardiac: RRR, no murmurs, rubs, or gallops. No clubbing, cyanosis, +2 pitting ankle edema.  Radials/DP/PT 2+ and equal bilaterally.  Respiratory:  Respirations regular and unlabored, clear to auscultation bilaterally. GI: Soft, nontender, nondistended, BS + x 4. MS: no deformity or atrophy. Skin: warm and dry, no rash.  Multiple healing ankle contusions. Neuro:  Strength and sensation are intact. Psych: Normal affect.  Accessory Clinical Findings    ECG personally reviewed by me today-none today. EKG 04/24/2019 Ventricular paced rhythm 70 bpm  Echocardiogram 01/24/2019 IMPRESSIONS    1. Left ventricular ejection fraction, by visual estimation, is 30 to  35%. The left ventricle has normal function. There is moderately increased  left ventricular hypertrophy.  2. Left ventricular diastolic parameters are consistent with Grade II  diastolic dysfunction (pseudonormalization).  3.  The left ventricle has no regional wall motion abnormalities.  4. Global right ventricle has normal systolic function.The right  ventricular size is normal. No increase in right ventricular wall  thickness.  5. Left atrial size was severely dilated.  6. Right atrial size was mildly dilated.  7. The mitral valve is grossly normal. Mild mitral valve regurgitation.  8. The tricuspid valve is grossly normal. Tricuspid valve regurgitation  moderate.  9. The aortic valve is grossly normal. Aortic valve regurgitation is  trivial. Mild aortic valve stenosis.  10. The pulmonic valve was grossly normal. Pulmonic valve regurgitation is  trivial.  11. Mildly elevated pulmonary artery systolic pressure.  12. A pacer wire is visualized.  13. The atrial septum is grossly normal.  Assessment & Plan   1.  Persistent atrial fibrillation-device clinic was notified of an episode of ongoing atrial fibrillation.  She presented to office for nips procedure by Dr. Loletha Grayer which was unsuccessful.  A DCCV was scheduled for 04/30/2019.  However, she spontaneously converted to normal sinus rhythm.  She was discharged home in stable condition.  CHA2DS2-VASc score 5, 7.2% risk per year for stroke. Continue amiodarone 200 mg daily Continue Eliquis 2.5 mg twice daily-multiple healing ankle contusions.  Son reassured about Eliquis and process. Continue carvedilol 3.125 mg twice daily Order CBC  Coronary artery disease-status post CABG x1 8/99 (LIMA to LAD).  No chest pain today Continue carvedilol 3.125 mg twice daily Heart healthy low-sodium diet Increase physical activity as tolerated  Chronic systolic congestive heart failure-no increased work of breathing today, +2 pitting ankle euvolemic.  Continue furosemide 20 mg tablet daily Heart healthy low-sodium diet-salty 6 given Daily weights Lower extremity support stockings Elevate lower extremities when not active  Hypercoagulability state-CHA2DS2-VASc score  5 Continue Eliquis 2.5 mg twice daily  Sinus node dysfunction-s/p [employer] PPM  insertion and 2003 with generator change outs in 2010/2019  Disposition: Follow-up with Dr. Sallyanne Kuster as scheduled.  Jossie Ng. [facility] [place] [address] Office [phone] Fax [phone]

## 2019-05-23 ENCOUNTER — Other Ambulatory Visit: Payer: Self-pay

## 2019-05-23 ENCOUNTER — Ambulatory Visit (INDEPENDENT_AMBULATORY_CARE_PROVIDER_SITE_OTHER): Payer: Medicare Other | Admitting: General Practice

## 2019-05-23 ENCOUNTER — Encounter: Payer: Self-pay | Admitting: General Practice

## 2019-05-23 VITALS — BP 122/45 | HR 60 | Temp 97.2°F | Ht 63.0 in | Wt 88.0 lb

## 2019-05-23 DIAGNOSIS — D6869 Other thrombophilia: Secondary | ICD-10-CM

## 2019-05-23 DIAGNOSIS — I495 Sick sinus syndrome: Secondary | ICD-10-CM | POA: Diagnosis not present

## 2019-05-23 DIAGNOSIS — I5022 Chronic systolic (congestive) heart failure: Secondary | ICD-10-CM

## 2019-05-23 DIAGNOSIS — I4819 Other persistent atrial fibrillation: Secondary | ICD-10-CM

## 2019-05-23 DIAGNOSIS — I2510 Atherosclerotic heart disease of native coronary artery without angina pectoris: Secondary | ICD-10-CM

## 2019-05-23 LAB — CBC
Hematocrit: 28.1 % — ABNORMAL LOW (ref 34.0–46.6)
Hemoglobin: 9.5 g/dL — ABNORMAL LOW (ref 11.1–15.9)
MCH: 33.8 pg — ABNORMAL HIGH (ref 26.6–33.0)
MCHC: 33.8 g/dL (ref 31.5–35.7)
MCV: 100 fL — ABNORMAL HIGH (ref 79–97)
Platelets: 174 10*3/uL (ref 150–450)
RBC: 2.81 x10E6/uL — ABNORMAL LOW (ref 3.77–5.28)
RDW: 15 % (ref 11.7–15.4)
WBC: 7.6 10*3/uL (ref 3.4–10.8)

## 2019-05-23 NOTE — Patient Instructions (Signed)
Medication Instructions:  The current medical regimen is effective;  continue present plan and medications as directed. Please refer to the Current Medication list given to you today. *If you need a refill on your cardiac medications before your next appointment, please call your pharmacy*  Lab Work: CBC TODAY If you have labs (blood work) drawn today and your tests are completely normal, you will receive your results only by:  [place] (if you have MyChart) OR A paper copy in the mail.  If you have any lab test that is abnormal or we need to change your treatment, we will call you to review the results.  Special Instructions PLEASE PURCHASE AND WEAR COMPRESSION STOCKINGS DAILY AND OFF AT BEDTIME. Compression stockings are elastic socks that squeeze the legs. They help to increase blood flow to the legs and to decrease swelling in the legs from fluid retention, and reduce the chance of developing blood clots in the lower legs. Please put on in the AM when dressing and off at night when dressing for bed. PLEASE MAKE SURE TO ELEVATE YOU LEGS WHILE SITTING, THIS WILL HELP WITH THE SWELLING ALSO.  PLEASE READ AND FOLLOW SALTY 6-ATTACHED  Follow-Up: Your next appointment:  PLEASE KEEP SCHEDULED In Person with Sanda Klein, MD  At [facility], you and your health needs are our priority.  As part of our continuing mission to provide you with exceptional heart care, we have created designated Provider Care Teams.  These Care Teams include your primary Cardiologist (physician) and Advanced Practice Providers (APPs -  Physician Assistants and Nurse Practitioners) who all work together to provide you with the care you need, when you need it.  We recommend signing up for the patient portal called "MyChart".  Sign up information is provided on this After Visit Summary.  MyChart is used to connect with patients for Virtual Visits (Telemedicine).  Patients are able to view lab/test results, encounter  notes, upcoming appointments, etc.  Non-urgent messages can be sent to your provider as well.   To learn more about what you can do with MyChart, go to [email or web address].

## 2019-05-26 ENCOUNTER — Other Ambulatory Visit: Payer: Self-pay | Admitting: *Deleted

## 2019-05-26 DIAGNOSIS — I4819 Other persistent atrial fibrillation: Secondary | ICD-10-CM

## 2019-05-26 DIAGNOSIS — D649 Anemia, unspecified: Secondary | ICD-10-CM

## 2019-05-27 NOTE — Telephone Encounter (Signed)
Created in error

## 2019-05-30 ENCOUNTER — Ambulatory Visit (HOSPITAL_COMMUNITY)
Admission: RE | Admit: 2019-05-30 | Discharge: 2019-05-30 | Disposition: A | Discharge status: Home / Self Care | Payer: Medicare Other | Source: Ambulatory Visit | Attending: Cardiovascular Disease | Admitting: Cardiovascular Disease

## 2019-05-30 ENCOUNTER — Other Ambulatory Visit: Payer: Self-pay

## 2019-05-30 DIAGNOSIS — I5032 Chronic diastolic (congestive) heart failure: Secondary | ICD-10-CM | POA: Insufficient documentation

## 2019-05-30 NOTE — Progress Notes (Signed)
  Echocardiogram 2D Echocardiogram has been performed.  Holly Bolton 05/30/2019, 2:01 PM

## 2019-06-02 ENCOUNTER — Encounter: Payer: Self-pay | Admitting: Cardiovascular Disease

## 2019-06-02 ENCOUNTER — Other Ambulatory Visit: Payer: Self-pay

## 2019-06-02 ENCOUNTER — Ambulatory Visit (INDEPENDENT_AMBULATORY_CARE_PROVIDER_SITE_OTHER): Payer: Medicare Other | Admitting: Cardiovascular Disease

## 2019-06-02 ENCOUNTER — Ambulatory Visit (INDEPENDENT_AMBULATORY_CARE_PROVIDER_SITE_OTHER): Payer: Medicare Other | Admitting: *Deleted

## 2019-06-02 ENCOUNTER — Other Ambulatory Visit: Payer: Self-pay | Admitting: *Deleted

## 2019-06-02 VITALS — BP 141/55 | HR 60 | Wt 86.0 lb

## 2019-06-02 DIAGNOSIS — I10 Essential (primary) hypertension: Secondary | ICD-10-CM

## 2019-06-02 DIAGNOSIS — Z8679 Personal history of other diseases of the circulatory system: Secondary | ICD-10-CM | POA: Diagnosis not present

## 2019-06-02 DIAGNOSIS — I4819 Other persistent atrial fibrillation: Secondary | ICD-10-CM

## 2019-06-02 DIAGNOSIS — Z9889 Other specified postprocedural states: Secondary | ICD-10-CM | POA: Diagnosis not present

## 2019-06-02 DIAGNOSIS — I739 Peripheral vascular disease, unspecified: Secondary | ICD-10-CM | POA: Diagnosis not present

## 2019-06-02 DIAGNOSIS — I495 Sick sinus syndrome: Secondary | ICD-10-CM

## 2019-06-02 DIAGNOSIS — N184 Chronic kidney disease, stage 4 (severe): Secondary | ICD-10-CM

## 2019-06-02 DIAGNOSIS — I5042 Chronic combined systolic (congestive) and diastolic (congestive) heart failure: Secondary | ICD-10-CM | POA: Diagnosis not present

## 2019-06-02 DIAGNOSIS — Z8639 Personal history of other endocrine, nutritional and metabolic disease: Secondary | ICD-10-CM

## 2019-06-02 DIAGNOSIS — I441 Atrioventricular block, second degree: Secondary | ICD-10-CM

## 2019-06-02 DIAGNOSIS — D649 Anemia, unspecified: Secondary | ICD-10-CM

## 2019-06-02 DIAGNOSIS — Z950 Presence of cardiac pacemaker: Secondary | ICD-10-CM

## 2019-06-02 DIAGNOSIS — I2510 Atherosclerotic heart disease of native coronary artery without angina pectoris: Secondary | ICD-10-CM

## 2019-06-02 LAB — CUP PACEART REMOTE DEVICE CHECK
Battery Remaining Longevity: 114 mo
Battery Remaining Percentage: 95.5 %
Battery Voltage: 2.99 V
Brady Statistic AP VP Percent: 31 %
Brady Statistic AP VS Percent: 1 %
Brady Statistic AS VP Percent: 48 %
Brady Statistic AS VS Percent: 20 %
Brady Statistic RA Percent Paced: 29 %
Brady Statistic RV Percent Paced: 81 %
Date Time Interrogation Session: 20210419020015
Implantable Lead Implant Date: 20030714
Implantable Lead Implant Date: 20030714
Implantable Lead Location: 753859
Implantable Lead Location: 753860
Implantable Pulse Generator Implant Date: 20190403
Lead Channel Impedance Value: 350 Ohm
Lead Channel Impedance Value: 450 Ohm
Lead Channel Pacing Threshold Amplitude: 0.875 V
Lead Channel Pacing Threshold Amplitude: 0.875 V
Lead Channel Pacing Threshold Pulse Width: 0.5 ms
Lead Channel Pacing Threshold Pulse Width: 0.5 ms
Lead Channel Sensing Intrinsic Amplitude: 0.8 mV
Lead Channel Sensing Intrinsic Amplitude: 3.1 mV
Lead Channel Setting Pacing Amplitude: 1.125
Lead Channel Setting Pacing Amplitude: 1.875
Lead Channel Setting Pacing Pulse Width: 0.5 ms
Lead Channel Setting Sensing Sensitivity: 0.7 mV
Pulse Gen Model: 2272
Pulse Gen Serial Number: 9004253

## 2019-06-02 NOTE — Patient Instructions (Signed)
Medication Instructions:  STOP the Eliquis  *If you need a refill on your cardiac medications before your next appointment, please call your pharmacy*   Lab Work: None ordered If you have labs (blood work) drawn today and your tests are completely normal, you will receive your results only by: Marland Kitchen MyChart Message (if you have MyChart) OR . A paper copy in the mail If you have any lab test that is abnormal or we need to change your treatment, we will call you to review the results.   Testing/Procedures: None ordered   Follow-Up: At [facility], you and your health needs are our priority.  As part of our continuing mission to provide you with exceptional heart care, we have created designated Provider Care Teams.  These Care Teams include your primary Cardiologist (physician) and Advanced Practice Providers (APPs -  Physician Assistants and Nurse Practitioners) who all work together to provide you with the care you need, when you need it.  We recommend signing up for the patient portal called "MyChart".  Sign up information is provided on this After Visit Summary.  MyChart is used to connect with patients for Virtual Visits (Telemedicine).  Patients are able to view lab/test results, encounter notes, upcoming appointments, etc.  Non-urgent messages can be sent to your provider as well.   To learn more about what you can do with MyChart, go to [email or web address].    Your next appointment:   2 month(s)  The format for your next appointment:   In Person  Provider:   Sanda Klein, MD

## 2019-06-02 NOTE — Progress Notes (Signed)
PPM Remote  

## 2019-06-02 NOTE — Progress Notes (Signed)
Patient ID: Holly Bolton, female   DOB: Jun 30, 1922, 86 y.o.   MRN: [id number]    Cardiology Office Note    Date:  06/03/2019   ID:  Holly Bolton, DOB [birth year], MRN [id number]  PCP:  Gayland Curry, DO  Cardiologist:   Sanda Klein, MD   Chief Complaint  Patient presents with  . Atrial Fibrillation    History of Present Illness:  Holly Bolton is a 86 y.o. female for follow up after overdrive pacing for atrial flutter, history of persistent atrial fibrillation associated with heart failure exacerbation November 2020.  Additional problems include PAD, CAD status post CABG, s/p AVR & ascending aortic aneurysm repair (Medtronic Freestyle root), pacemaker for SSS and 2nd deg AV block.  She is accompanied by her son.  Her pacemaker showed persistent atrial fibrillation for about a month, before she was hospitalized on December 9 with orthopnea, edema and weight gain.  Dry weight at discharge was around 85 pounds on her home scale.  Had successful electrical cardioversion after 1 month of anticoagulation on December 28, but then recurred with atrial flutter in March 2021.  Overdrive pacing of atrial flutter appeared to be unsuccessful with conversion to atrial fibrillation, amiodarone was started, but shortly after that procedure she spontaneously transitioned to normal rhythm.    It is hard to say whether there is any net benefits of return to normal rhythm.  She continues to be very sedentary but does not become short of breath with her usual activities such as bathing and dressing.  She does become short of breath when walking about 200 feet as her son tries to perform some physical therapy.  She wears compression stockings all the time and these work well she has only mild edema, worse at the end of the day.  She does not have orthopnea or PND.  She is never really been aware of palpitations.  She has not had dizziness or syncope or focal neurological events.  Known to have intermittent  claudication in the right calf with walking, not recently a problem.  She has had a lot of spontaneous bruising on Eliquis, even on her face.  She had a bleeding spot on her leg that took hours to stop and kept rebleeding.  Her hemoglobin has been stable around 10 with macrocytic indices.  Denies falls.  Her echocardiogram December was interpreted as showing an ejection fraction of 30-35%, although on my review the ejection fraction was probably around 45%, largely diminished due to marked pacing induced ventricular dyssynchrony.  Repeat echocardiogram performed on April 16 shows very similar findings.  EF is around 45-50%.  Is evidence of elevated left and right heart filling pressures with pseudonormal mitral inflow and noncollapsing inferior vena cava.  She has a large left pleural effusion.  There is moderate mitral valve regurgitation.  The aortic valve prosthesis is functioning well with only mild regurgitation and mild dilation of the aortic root.  Moderate to severe tricuspid regurgitation and markedly elevated pulmonary artery pressure estimated at 67 mmHg.  She is normal rhythm today (atrial paced ventricular paced) there has been no recurrence of the atrial flutter or atrial fibrillation since the attempted overdrive pacing about a month ago.  Officially her counters showed 29% atrial pacing and 81 ventricular pacing, review of the trends shows essentially 100% atrial pacing and 100% ventricular pacing since around April 1, consistent with amiodarone effect.   She underwent aortic root replacement with a Medtronic Freestyle root, with coronary  reimplantation and a single vessel LIMA to LAD bypass in 1999 (for bicuspid valve with severe stenosis and ascending aortic aneurysm). She received a dual-chamber permanent pacemaker for sinus node dysfunction and intermittent high-grade AV block. She had a pacemaker generator change in 2010 ([address] Jude accent). She has hyperlipidemia but prefers not to take  statin medication.  She was hospitalized in April 2020 and again in December 2020 with acute exacerbation of heart failure, in December related to persistent atrial fibrillation.  In September 2019 EF was 50-55%, in December 2028 had dropped to 30-35%.  She underwent electrical cardioversion on February 10, 2019  Most recent echo December 2020  with normal bioprosthesis function with an LVEF 30-35%.  The left atrium is severely dilated.  Past Medical History:  Diagnosis Date  . Aneurysm (arteriovenous) of coronary vessels    ascending aorta requiring bypass of the LAD  . Carotid stenosis 04/28/08   Doppler: <40% stenosis bilateral  . Chronic kidney disease, stage 3   . Hyperlipemia   . Hypertension   . Hypertensive chronic kidney disease   . Hypothyroidism, unspecified 02/13/2018   Symptoms well controlled with current therapy  . Mild cognitive impairment, so stated 03/28/2018   Symptoms poorly controlled, needs frequent adjustments in treatment and dose monitoring  . Mild intermittent asthma    Symptoms controlled with difficulty, affecting daily functioning, needs ongoing monitoring  . Occlusion and stenosis of bilateral carotid arteries   . Pacemaker generator end of life 11/18/08   Intermittent high-grade atrioventricular block  . Peripheral arterial disease (HCC)    left ABI of 0.78  . Presence of permanent cardiac pacemaker 08/26/01   Sinus node dysfunction-[facility]  . Presence of xenogenic heart valve   . PVD (peripheral vascular disease) ([place])   . SSS (sick sinus syndrome) ([place])   . Status post ascending aortic aneurysm repair/AVR -  Medtronic Freestyle root 09/28/2014   ascending aorta requiring bypass of the LAD     Past Surgical History:  Procedure Laterality Date  . ABDOMINAL HYSTERECTOMY  1966  . APPENDECTOMY  1943  . ASCENDING AORTIC ANEURYSM REPAIR  09/22/97   porcine aortic root  . BREAST FIBROADENOMA SURGERY  11/89  . CARDIAC SURGERY  09/22/1997   aortic valve  root repair with porcine at time of CABG  . CARDIOVERSION N/A 02/10/2019   Procedure: CARDIOVERSION;  Surgeon: Skeet Latch, MD;  Location: [facility];  Service: Cardiovascular;  Laterality: N/A;  . CORONARY ARTERY BYPASS GRAFT  09/22/97   LIMA to the LAD  . I & D EXTREMITY Left 08/15/2017   Procedure: IRRIGATION AND DEBRIDEMENT EXTREMITY;  Surgeon: Nicholes Stairs, MD;  Location: [facility];  Service: Orthopedics;  Laterality: Left;  . [place]   left  . NEPHRECTOMY  1958   right  . ORIF HUMERUS FRACTURE Left 08/15/2017   Procedure: OPEN REDUCTION INTERNAL FIXATION (ORIF) PROXIMAL HUMERUS FRACTURE;  Surgeon: Nicholes Stairs, MD;  Location: [facility];  Service: Orthopedics;  Laterality: Left;  . OVARIAN CYST SURGERY  1948  . PACEMAKER GENERATOR CHANGE  11/18/08   [facility]  . PACEMAKER INSERTION  08/26/01   [facility]  . PPM GENERATOR CHANGEOUT N/A 05/16/2017   Procedure: PPM GENERATOR CHANGEOUT;  Surgeon: Sanda Klein, MD;  Location: [facility];  Service: Cardiovascular;  Laterality: N/A;    Current Medications: Outpatient Medications Prior to Visit  Medication Sig Dispense Refill  . acetaminophen (TYLENOL) 500 MG tablet Take 500 mg by mouth  every 6 (six) hours as needed for mild pain or headache.    . albuterol (ACCUNEB) 0.63 MG/3ML nebulizer solution Take 3 mLs (0.63 mg total) by nebulization every 6 (six) hours as needed for wheezing. 75 mL 1  . amiodarone (PACERONE) 200 MG tablet Take 1 tablet by mouth twice a day for 1 week then reduce to 1 tablet daily (Patient taking differently: Take 200 mg by mouth See admin instructions. Take 200 mg by mouth twice a day for 1 week then reduce to 200 mg daily) 60 tablet 0  . brimonidine (ALPHAGAN) 0.2 % ophthalmic solution Place 1 drop into both eyes 2 (two) times daily.   2  . carvedilol (COREG) 3.125 MG tablet TAKE ONE TABLET BY MOUTH TWICE A DAY WITH FOOD (Patient taking differently: Take 3.125 mg by mouth in the  morning and at bedtime. ) 180 tablet 1  . Cholecalciferol (VITAMIN D) 50 MCG (2000 UT) CAPS Take 1 capsule by mouth at bedtime.     . Coenzyme Q10 (COQ10) 200 MG CAPS Take 200 mg by mouth daily.    . diphenhydramine-acetaminophen (TYLENOL PM) 25-500 MG TABS tablet Take 0.5 tablets by mouth at bedtime as needed (sleep).    . furosemide (LASIX) 20 MG tablet Take 1 tablet (20 mg total) by mouth daily. 30 tablet 1  . lidocaine (LMX) 4 % cream Apply 1 application topically daily as needed (pain).    . vitamin B-12 (CYANOCOBALAMIN) 500 MCG tablet Take 500 mcg by mouth daily.    Marland Kitchen apixaban (ELIQUIS) 2.5 MG TABS tablet Take 1 tablet (2.5 mg total) by mouth 2 (two) times daily. 60 tablet 11   No facility-administered medications prior to visit.     Allergies:   Accupril [quinapril hcl], Amlodipine, Benadryl [diphenhydramine hcl], Biaxin [clarithromycin], Ciprofloxacin, Codeine, Diovan [valsartan], Medrol [methylprednisolone], Morphine and related, Neurontin [gabapentin], and Penicillins   Social History   Socioeconomic History  . Marital status: Widowed    Spouse name: Not on file  . Number of children: 1  . Years of education: 28  . Highest education level: High school graduate  Occupational History  . 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 Smoking status: Never Smoker  . Smokeless tobacco: Never Used  Substance and Sexual Activity  . Alcohol use: No  . Drug use: No  . Sexual activity: Not Currently  Other Topics Concern  . Not on file  Social History Narrative   Lives with son, Algis Greenhouse   Caffeine use: 1 cup coffee per day   Right handed   Exercises at home with theraband, stationary bike and walking   Social Determinants of Health   Financial Resource Strain:   . Difficulty of Paying Living Expenses:   Food Insecurity:   . Worried About Charity fundraiser in the Last Year:   . Arboriculturist in the Last Year:   Transportation Needs:   . Lexicographer (Medical):   Marland Kitchen Lack of Transportation (Non-Medical):   Physical Activity:   . Days of Exercise per Week:   . Minutes of Exercise per Session:   Stress:   . Feeling of Stress :   Social Connections:   . Frequency of Communication with Friends and Family:   . Frequency of Social Gatherings with Friends and Family:   . Attends Religious Services:   . Active Member of Clubs or Organizations:   . Attends Archivist Meetings:   .  Marital Status:      Family History:  The patient's family history includes Heart attack in her brother, father, and mother; Hyperlipidemia in her sister; Hypertension in her sister.   ROS:   Please see the history of present illness.    ROS All other systems are reviewed and are negative.   PHYSICAL EXAM:   VS:  BP (!) 141/55   Pulse 60   Wt 86 lb (39 kg)   SpO2 98%   BMI 15.23 kg/m      General: Alert, oriented x3, no distress, appears very elderly and frail.  Healthy left subclavian pacemaker site. Head: no evidence of trauma, PERRL, EOMI, no exophtalmos or lid lag, no myxedema, no xanthelasma; normal ears, nose and oropharynx Neck: normal jugular venous pulsations and no hepatojugular reflux; brisk carotid pulses without delay and no carotid bruits Chest: clear to auscultation, no signs of consolidation by percussion or palpation, normal fremitus, symmetrical and full respiratory excursions Cardiovascular: normal position and quality of the apical impulse, regular rhythm, normal first and second heart sounds, 2/ aortic ejection murmur, no diastolic murmurs, rubs or gallops Abdomen: no tenderness or distention, no masses by palpation, no abnormal pulsatility or arterial bruits, normal bowel sounds, no hepatosplenomegaly Extremities: no clubbing, cyanosis; symmetrical 1+ ankle and pretibial edema; 2+ radial, ulnar and brachial pulses bilaterally; 2+ right femoral, posterior tibial and dorsalis pedis pulses; 2+ left femoral,  posterior tibial and dorsalis pedis pulses; no subclavian or femoral bruits Neurological: grossly nonfocal Psych: Normal mood and affect   Wt Readings from Last 3 Encounters:  06/02/19 86 lb (39 kg)  05/23/19 88 lb (39.9 kg)  04/24/19 87 lb 12.8 oz (39.8 kg)      Studies/Labs Reviewed:   EKG:  EKG is ordered today.  It shows sinus rhythm, bifascicular block (RBBB+ LAFB)  ECHO 08/16/2017 - Left ventricle: The cavity size was normal. Wall thickness was   increased in a pattern of moderate LVH. Systolic function was   normal. The estimated ejection fraction was in the range of 50%   to 55%. The study was not technically sufficient to allow   evaluation of LV diastolic dysfunction due to atrial   fibrillation. - Aortic valve: Moderately calcified annulus. Trileaflet;   moderately thickened leaflets. Valve area (VTI): 2.53 cm^2.  Peak gradient 9 mmHg, mean gradient 5 mmHg - Mitral valve: Mildly calcified annulus. Mildly thickened leaflets   . There was mild to moderate regurgitation. - Left atrium: The atrium was severely dilated. - Right ventricle: The cavity size was mildly dilated. - Right atrium: The atrium was moderately dilated. - Tricuspid valve: There was moderate regurgitation. - Pulmonary arteries: Systolic pressure was moderately increased.   PA peak pressure: 58 mm Hg (S). - Technically difficult study.  Echo 01/24/2019  1. Left ventricular ejection fraction, by visual estimation, is 30 to 35%. The left ventricle has normal function. There is moderately increased left ventricular hypertrophy.  2. Left ventricular diastolic parameters are consistent with Grade II diastolic dysfunction (pseudonormalization).  3. The left ventricle has no regional wall motion abnormalities.  4. Global right ventricle has normal systolic function.The right ventricular size is normal. No increase in right ventricular wall thickness.  5. Left atrial size was severely dilated.  6. Right  atrial size was mildly dilated.  7. The mitral valve is grossly normal. Mild mitral valve regurgitation.  8. The tricuspid valve is grossly normal. Tricuspid valve regurgitation moderate.  9. The aortic valve is grossly normal. Aortic  valve regurgitation is trivial. Mild aortic valve stenosis. 10. The pulmonic valve was grossly normal. Pulmonic valve regurgitation is trivial. 11. Mildly elevated pulmonary artery systolic pressure. 12. A pacer wire is visualized. 13. The atrial septum is grossly normal.  ECHO 05/30/2019 1. There is profound pacing-induced dyssynchrony.. Left ventricular  ejection fraction, by estimation, is 45 to 50%. The left ventricle has  mildly decreased function. The left ventricle has no regional wall motion  abnormalities. There is mild concentric  left ventricular hypertrophy. Left ventricular diastolic parameters are  consistent with Grade II diastolic dysfunction (pseudonormalization).  Elevated left atrial pressure.  2. Large pleural effusion in the left lateral region.  3. The mitral valve is normal in structure. Moderate mitral valve  regurgitation.  4. Tricuspid valve regurgitation is moderate to severe.  5. Medtronic Freestyle root replacement 1999. The aortic valve has been  repaired/replaced. Aortic valve regurgitation is mild. Echo findings are  consistent with normal structure and function of the aortic valve  prosthesis.  6. There is borderline dilatation of the aortic root measuring 40 mm.  7. There is moderately elevated pulmonary artery systolic pressure.  8. The inferior vena cava is normal in size with <50% respiratory  variability, suggesting right atrial pressure of 8 mmHg.   Comparison(s): No significant change from prior study. Prior images  reviewed side by side. LVEF was also 45-50% on the previous echo.   Lipid Panel  Total cholesterol 170, HDL 63, LDL 99, triglycerides 57 Hemoglobin 10.1, creatinine 2.0, potassium  4.7  ASSESSMENT:    1. Chronic combined systolic and diastolic heart failure (HCC)   2. Persistent atrial fibrillation ([place])   3. Second degree AV block   4. SSS (sick sinus syndrome) ([place])   5. Pacemaker   6. Essential hypertension   7. Coronary artery disease involving native coronary artery of native heart without angina pectoris   8. Status post ascending aortic aneurysm repair/AVR -  Medtronic Freestyle root   9. PAD (peripheral artery disease) ([place])   10. History of hyperthyroidism   11. CKD (chronic kidney disease) stage 4, GFR 15-29 ml/min (HCC)      PLAN:  In order of problems listed above:   1. CHF: On my review the ejection fraction has not really changed much.  Mildly decreased LVEF is due to RV pacing induced asynchrony.  She appears to be clinically improved following cardioversion, but she requires virtually 100% ventricular pacing now.   She has had serious problems with orthostatic hypotension.  She is on a low-dose of carvedilol and a low-dose of loop diuretic, but I do not think she would tolerate RAAS inhibitors.  As far as I can tell she is clinically euvolemic.  She has a very narrow margin of compensation and I asked her son to call us for a weight gain of 3lb/24h or at any point when her weight reaches 90 pounds or higher.  Dry weight appears to be around 85 pounds. 2. Afib: associated with heart failure decompensation in November 2020.  After the most recent episode, it's not so clear that return to normal rhythm has had a huge impact.  Currently on amiodarone and maintaining atrial paced, ventricular paced rhythm.  She has pretty severe bruising from the Eliquis.  She is very elderly, has very low body mass and abnormal kidney function.  Even the 2.5 mg dose may be too much.  She has had falls in the past.  I think the risk of bleeding exceeds the  risk of stroke at this point.  We will stop the Eliquis since it's been a month since conversion to normal rhythm.  We  will use her device to alert [place] if she has episodes of atrial fibrillation that lasts more than 24 hours or a cumulative burden of >48h/week (and will restart Eliquis 2.5 mg twice daily if this happens). 3. 2nd deg AV block: I do not think we can avoid the high frequency of RV pacing.  This happened when she was in atrial fibrillation and is now occurring probably due to amiodarone therapy. 4. SSS: Now she also has 100% atrial pacing with a rather flat histogram, but she is very sedentary. 5. PPM: Normal device function, recently with 100% atrial paced, ventricular paced rhythm.  She is rather frail and I am not sure she is a good candidate for CRT upgrade.  Has some relatively low sensing on both the atrial and the ventricular channel (leads from 2003), but there is no evidence of this is interfering with normal device function so far. 6. HTN: Avoid perfect blood pressure control.  Targeting a blood pressure of systolic under 163, due to orthostatic hypotension.  7. CAD: She does not have angina pectoris.  She had coronary reimplantation of the time of aortic valve/aortic root surgery.   8. S/P Asc Ao repair and bioAVR (Medtronic Freestyle): Echo continues to show normal gradients across the prosthetic valve with only mild aortic insufficiency on recent echo from April 2021. 9. PAD: She is quite sedentary and has not recently complained of claudication in her right calf.  She saw Dr. Gwenlyn Found.  Most of her disease was infrapopliteal and medical therapy was recommended especially since she had a moderate renal insufficiency. 10. Hyperthyroidism: To monitor closely for any signs of thyrotoxicosis or hypothyroidism as the amiodarone "kicks in". 11. CKD 4: GFR around 25-30.  Baseline creatinine roughly 1.6.  Renal function appears to be stable.    Medication Adjustments/Labs and Tests Ordered: Current medicines are reviewed at length with the patient today.  Concerns regarding medicines are outlined above.   Medication changes, Labs and Tests ordered today are listed in the Patient Instructions below. Patient Instructions  Medication Instructions:  STOP the Eliquis  *If you need a refill on your cardiac medications before your next appointment, please call your pharmacy*   Lab Work: None ordered If you have labs (blood work) drawn today and your tests are completely normal, you will receive your results only by: Marland Kitchen MyChart Message (if you have MyChart) OR . A paper copy in the mail If you have any lab test that is abnormal or we need to change your treatment, we will call you to review the results.   Testing/Procedures: None ordered   Follow-Up: At [facility], you and your health needs are our priority.  As part of our continuing mission to provide you with exceptional heart care, we have created designated Provider Care Teams.  These Care Teams include your primary Cardiologist (physician) and Advanced Practice Providers (APPs -  Physician Assistants and Nurse Practitioners) who all work together to provide you with the care you need, when you need it.  We recommend signing up for the patient portal called "MyChart".  Sign up information is provided on this After Visit Summary.  MyChart is used to connect with patients for Virtual Visits (Telemedicine).  Patients are able to view lab/test results, encounter notes, upcoming appointments, etc.  Non-urgent messages can be sent to your provider as well.  To learn more about what you can do with MyChart, go to [email or web address].    Your next appointment:   2 month(s)  The format for your next appointment:   In Person  Provider:   Sanda Klein, MD      Signed, Sanda Klein, MD  06/03/2019 12:19 PM    [address], [place], [place]  [address] Phone: [phone]; Fax: [phone]

## 2019-06-03 LAB — CBC
Hematocrit: 29.6 % — ABNORMAL LOW (ref 34.0–46.6)
Hemoglobin: 10 g/dL — ABNORMAL LOW (ref 11.1–15.9)
MCH: 33.4 pg — ABNORMAL HIGH (ref 26.6–33.0)
MCHC: 33.8 g/dL (ref 31.5–35.7)
MCV: 99 fL — ABNORMAL HIGH (ref 79–97)
Platelets: 248 10*3/uL (ref 150–450)
RBC: 2.99 x10E6/uL — ABNORMAL LOW (ref 3.77–5.28)
RDW: 14.6 % (ref 11.7–15.4)
WBC: 5.5 10*3/uL (ref 3.4–10.8)

## 2019-06-06 DIAGNOSIS — J90 Pleural effusion, not elsewhere classified: Secondary | ICD-10-CM

## 2019-06-08 ENCOUNTER — Other Ambulatory Visit (HOSPITAL_COMMUNITY): Payer: Self-pay | Admitting: Physician Assistant

## 2019-06-10 ENCOUNTER — Other Ambulatory Visit: Payer: Self-pay

## 2019-06-10 ENCOUNTER — Telehealth: Payer: Self-pay | Admitting: Cardiovascular Disease

## 2019-06-10 ENCOUNTER — Ambulatory Visit
Admission: RE | Admit: 2019-06-10 | Discharge: 2019-06-10 | Disposition: A | Discharge status: Home / Self Care | Payer: Medicare Other | Source: Ambulatory Visit | Attending: Cardiovascular Disease | Admitting: Cardiovascular Disease

## 2019-06-10 DIAGNOSIS — J90 Pleural effusion, not elsewhere classified: Secondary | ICD-10-CM

## 2019-06-10 DIAGNOSIS — R0602 Shortness of breath: Secondary | ICD-10-CM | POA: Diagnosis not present

## 2019-06-10 NOTE — Telephone Encounter (Signed)
The first step would be to repeat a chest x-ray Lattie Haw: PA and lateral for pleural effusion, please) to reassess the effusion size. Last x-ray was in December, the echo only shows the effusion where it neighbors the heart and is not a good way to assess its extent.

## 2019-06-10 NOTE — Telephone Encounter (Signed)
I'm surprised. Maybe they can explain why they want to switch?  If they want a second opinion from Dr. Tamala Julian, that's perfectly fine with me and I greatly value his opinion. I never object to patient's requests to switch, but please point out that Dr. Tamala Julian will not be able to monitor Mrs. Nourse's pacemaker.  If they want me to continue to follow her pacemaker, I am happy to do that. If they want a new Cardiology team altogether, she will need to have one of our EP docs at [facility] start seeing her for the pacemaker.

## 2019-06-10 NOTE — Telephone Encounter (Addendum)
Spoke to the patient's son, per the dpr. He stated that he would like to meet with Dr. Tamala Julian for a second opinion. He has been advised that he would need to find a second provider for the patient's pacemaker if he had wanted to switch to Dr. Tamala Julian. He stated that he was happy with Dr. Sallyanne Kuster but wanted to see Dr. Tamala Julian since the patient had seen him in the hospital at one point and they were happy with his care.   He has not been advised of Dr. Thompson Caul recommendations yet.

## 2019-06-10 NOTE — Telephone Encounter (Signed)
Patient would like to have transition of care from Dr. Sallyanne Kuster to Dr. Tamala Julian  Please confirm to see if that is ok with both Dr.

## 2019-06-10 NOTE — Telephone Encounter (Signed)
It would be best to stay with Dr. Sallyanne Kuster.  He will be practicing much longer than I and a switch to me believes holes in her care relative to pacemaker follow-up.  Switching is not a good idea.

## 2019-06-11 ENCOUNTER — Telehealth: Payer: Self-pay | Admitting: *Deleted

## 2019-06-11 DIAGNOSIS — M25561 Pain in right knee: Secondary | ICD-10-CM | POA: Diagnosis not present

## 2019-06-11 NOTE — Telephone Encounter (Signed)
The patient's son has been made aware of Dr. Thompson Caul recommendations. He stated that he does not want to switch but only wants a consult.

## 2019-06-11 NOTE — Telephone Encounter (Signed)
-----   Message from Sanda Klein, MD sent at 06/11/2019  9:36 AM EDT ----- The pleural effusion is only moderate in size.  I personally doubt that draining that amount of fluid is likely to make a big difference in symptoms.  Although the risks of an ultrasound-guided thoracentesis are not high, they may be higher than the benefit.

## 2019-06-11 NOTE — Telephone Encounter (Addendum)
Patient's son has been made aware of results and verbalized understanding. He stated that he read the results as her "lung is collapsed." He has been advised that the findings state that the lungs are adequately filled.   FINDINGS: Sternotomy wires and dual lead right pacemaker unchanged. Lungs are adequately inflated and demonstrate stable moderate size left pleural effusion likely with associated basilar atelectasis. Cardiomediastinal silhouette and remainder of the exam is unchanged.  The son stated that he feels like the patient has "no reserve" left and wants to proceed with a thoracentesis.  The son stated that he is frustrated because he feels like he is getting mixed recommendations from the patient's PCP and her other providers. He stated that the patient had an appointment with Dr. Haroldine Laws and he thought it would be a comprehensive evaluation of the patient's current status but does not feel like that was done.  He wants to know if the patient's conditions is mild or so advanced that nothing can be done. He feels like he has to interpret results himself and is being stuck in the middle of his mother and providers.

## 2019-06-13 ENCOUNTER — Encounter: Payer: Self-pay | Admitting: Nurse Practitioner

## 2019-06-13 ENCOUNTER — Other Ambulatory Visit: Payer: Self-pay

## 2019-06-13 ENCOUNTER — Ambulatory Visit (INDEPENDENT_AMBULATORY_CARE_PROVIDER_SITE_OTHER): Payer: Medicare Other | Admitting: Nurse Practitioner

## 2019-06-13 VITALS — BP 140/82 | HR 60 | Temp 97.9°F | Ht 63.0 in | Wt 89.6 lb

## 2019-06-13 DIAGNOSIS — R2681 Unsteadiness on feet: Secondary | ICD-10-CM

## 2019-06-13 DIAGNOSIS — I2510 Atherosclerotic heart disease of native coronary artery without angina pectoris: Secondary | ICD-10-CM

## 2019-06-13 DIAGNOSIS — W19XXXA Unspecified fall, initial encounter: Secondary | ICD-10-CM | POA: Diagnosis not present

## 2019-06-13 DIAGNOSIS — S81812A Laceration without foreign body, left lower leg, initial encounter: Secondary | ICD-10-CM | POA: Diagnosis not present

## 2019-06-13 NOTE — Progress Notes (Signed)
Careteam: Patient Care Team: Gayland Curry, DO as PCP - General (Geriatric Medicine) Sanda Klein, MD as PCP - Cardiology (Cardiology)  PLACE OF SERVICE:  [place] Directive information Does Patient Have a Medical Advance Directive?: No, Does patient want to make changes to medical advance directive?: No - Patient declined  Allergies  Allergen Reactions  . Accupril [Quinapril Hcl] Other (See Comments)    Unknown reaction   . Amlodipine Swelling  . Benadryl [Diphenhydramine Hcl] Other (See Comments)    Unknown reaction   . Biaxin [Clarithromycin] Other (See Comments)    Unknown reaction   . Ciprofloxacin Nausea And Vomiting  . Codeine Other (See Comments)    Unknown reaction   . Diovan [Valsartan] Other (See Comments)    Unknown reaction   . Medrol [Methylprednisolone] Other (See Comments)    Unknown  . Morphine And Related Other (See Comments)    Unknown reaction   . Neurontin [Gabapentin] Other (See Comments)    Unknown reaction   . Penicillins     Unknown reaction  Did it involve swelling of the face/tongue/throat, SOB, or low BP? Unknown Did it involve sudden or severe rash/hives, skin peeling, or any reaction on the inside of your mouth or nose? Unknown Did you need to seek medical attention at a hospital or doctor's office? Unknown When did it last happen?unknown If all above answers are "NO", may proceed with cephalosporin use.      Chief Complaint  Patient presents with  . Acute Visit    fall with bruising over eye      HPI: Patient is a 86 y.o. female presents to the office today from a fall at home on 06/10/2019. She lives with her son, [name]. Pt with hx of right knee pain, CHF, a fib, SSS, hyperthyroid and multiple falls. She was at home 3 days ago with her son when he heard her fall in another room. There was no LOC or AMS but pt does not remember how she feel. Son states he saw her laying on her back after the fall. Pt states  she does not recall hitting her head but yesterday noticed a bruise over left eye. No pain or swelling but  Pt reports "I have never had a black eye" Pt had a fall month ago as well. Pt does not have any pain at this time but multiple bruises which the son contributes to eliquis which was dc'd on 4/19.  Also of note she has a left lower leg skin tear that the son has been managing at home, slowly healing and using tegaderm.   Review of Systems:  Review of Systems  Constitutional: Negative for chills and fever.  Cardiovascular: Negative for chest pain and palpitations.  Gastrointestinal: Negative for nausea and vomiting.  Musculoskeletal: Positive for falls.  Neurological: Negative for dizziness and headaches.  Endo/Heme/Allergies: Bruises/bleeds easily (was on Eliquis).    Past Medical History:  Diagnosis Date  . Aneurysm (arteriovenous) of coronary vessels    ascending aorta requiring bypass of the LAD  . Carotid stenosis 04/28/08   Doppler: <40% stenosis bilateral  . Chronic kidney disease, stage 3   . Hyperlipemia   . Hypertension   . Hypertensive chronic kidney disease   . Hypothyroidism, unspecified 02/13/2018   Symptoms well controlled with current therapy  . Mild cognitive impairment, so stated 03/28/2018   Symptoms poorly controlled, needs frequent adjustments in treatment and dose monitoring  . Mild intermittent asthma  Symptoms controlled with difficulty, affecting daily functioning, needs ongoing monitoring  . Occlusion and stenosis of bilateral carotid arteries   . Pacemaker generator end of life 11/18/08   Intermittent high-grade atrioventricular block  . Peripheral arterial disease (HCC)    left ABI of 0.78  . Presence of permanent cardiac pacemaker 08/26/01   Sinus node dysfunction-[facility]  . Presence of xenogenic heart valve   . PVD (peripheral vascular disease) ([place])   . SSS (sick sinus syndrome) ([place])   . Status post ascending aortic aneurysm repair/AVR -   Medtronic Freestyle root 09/28/2014   ascending aorta requiring bypass of the LAD    Past Surgical History:  Procedure Laterality Date  . ABDOMINAL HYSTERECTOMY  1966  . APPENDECTOMY  1943  . ASCENDING AORTIC ANEURYSM REPAIR  09/22/97   porcine aortic root  . BREAST FIBROADENOMA SURGERY  11/89  . CARDIAC SURGERY  09/22/1997   aortic valve root repair with porcine at time of CABG  . CARDIOVERSION N/A 02/10/2019   Procedure: CARDIOVERSION;  Surgeon: Skeet Latch, MD;  Location: [facility];  Service: Cardiovascular;  Laterality: N/A;  . CORONARY ARTERY BYPASS GRAFT  09/22/97   LIMA to the LAD  . I & D EXTREMITY Left 08/15/2017   Procedure: IRRIGATION AND DEBRIDEMENT EXTREMITY;  Surgeon: Nicholes Stairs, MD;  Location: [facility];  Service: Orthopedics;  Laterality: Left;  . [place]   left  . NEPHRECTOMY  1958   right  . ORIF HUMERUS FRACTURE Left 08/15/2017   Procedure: OPEN REDUCTION INTERNAL FIXATION (ORIF) PROXIMAL HUMERUS FRACTURE;  Surgeon: Nicholes Stairs, MD;  Location: [facility];  Service: Orthopedics;  Laterality: Left;  . OVARIAN CYST SURGERY  1948  . PACEMAKER GENERATOR CHANGE  11/18/08   [facility]  . PACEMAKER INSERTION  08/26/01   [facility]  . PPM GENERATOR CHANGEOUT N/A 05/16/2017   Procedure: PPM GENERATOR CHANGEOUT;  Surgeon: Sanda Klein, MD;  Location: [facility];  Service: Cardiovascular;  Laterality: N/A;   Social History:   reports that she has never smoked. She has never used smokeless tobacco. She reports that she does not drink alcohol or use drugs.  Family History  Problem Relation Age of Onset  . Heart attack Mother   . Heart attack Father   . Heart attack Brother   . Hyperlipidemia Sister   . Hypertension Sister     Medications: Patient's Medications  New Prescriptions   No medications on file  Previous Medications   ACETAMINOPHEN (TYLENOL) 500 MG TABLET    Take 500 mg by mouth every 6 (six) hours as needed for mild  pain or headache.   ALBUTEROL (ACCUNEB) 0.63 MG/3ML NEBULIZER SOLUTION    Take 3 mLs (0.63 mg total) by nebulization every 6 (six) hours as needed for wheezing.   AMIODARONE (PACERONE) 200 MG TABLET    TAKE ONE TABLET BY MOUTH TWICE A DAY FOR 1 WEEK THEN REDUCE TO 1 TABLET DAILY THEREAFTER   BRIMONIDINE (ALPHAGAN) 0.2 % OPHTHALMIC SOLUTION    Place 1 drop into both eyes 2 (two) times daily.    CARVEDILOL (COREG) 3.125 MG TABLET    TAKE ONE TABLET BY MOUTH TWICE A DAY WITH FOOD   CHOLECALCIFEROL (VITAMIN D) 50 MCG (2000 UT) CAPS    Take 1 capsule by mouth at bedtime.    COENZYME Q10 (COQ10) 200 MG CAPS    Take 200 mg by mouth daily.   DIPHENHYDRAMINE-ACETAMINOPHEN (TYLENOL PM) 25-500 MG TABS TABLET  Take 0.5 tablets by mouth at bedtime as needed (sleep).   FUROSEMIDE (LASIX) 20 MG TABLET    Take 1 tablet (20 mg total) by mouth daily.   LIDOCAINE (LMX) 4 % CREAM    Apply 1 application topically daily as needed (pain).   VITAMIN B-12 (CYANOCOBALAMIN) 500 MCG TABLET    Take 500 mcg by mouth daily.  Modified Medications   No medications on file  Discontinued Medications   No medications on file    Physical Exam:  Vitals:   06/13/19 1514  BP: 140/82  Pulse: 60  Temp: 97.9 F (36.6 C)  TempSrc: Temporal  SpO2: 95%  Weight: 89 lb 9.6 oz (40.6 kg)  Height: 5\' 3"  (1.6 m)   Body mass index is 15.87 kg/m. Wt Readings from Last 3 Encounters:  06/13/19 89 lb 9.6 oz (40.6 kg)  06/02/19 86 lb (39 kg)  05/23/19 88 lb (39.9 kg)    Physical Exam Cardiovascular:     Rate and Rhythm: Normal rate and regular rhythm.     Pulses: Normal pulses.     Heart sounds: Heart sounds are distant.  Pulmonary:     Effort: Pulmonary effort is normal.     Breath sounds: Normal breath sounds.  Abdominal:     General: Bowel sounds are normal.     Palpations: Abdomen is soft.  Musculoskeletal:     Right shoulder: No tenderness. Decreased range of motion (baseline).     Left shoulder: No tenderness.  Decreased range of motion (baseline).     Right elbow: Tenderness (with mild bruising noted, no decrease in ROM, mild) present.     Left elbow: No tenderness.     Right knee: Ecchymosis present.     Right lower leg: 2+ Edema present.     Left lower leg: 2+ Edema present.     Comments: Skin tear to left lower leg, tegaderm in place, no signs of infection   Skin:    Findings: Bruising (left eye, left cheek) present.  Neurological:     General: No focal deficit present.     Mental Status: She is alert.  Psychiatric:        Attention and Perception: Attention normal.        Mood and Affect: Mood normal.        Speech: Speech normal.        Behavior: Behavior normal.     Labs reviewed: Basic Metabolic Panel: Recent Labs    10/11/18 2301 10/12/18 0426 10/15/18 0504 10/24/18 0909 01/22/19 1732 01/23/19 0251 01/24/19 0326 01/30/19 1423 03/13/19 1556 03/13/19 1557 04/14/19 1054 04/24/19 1137  NA  --    < > 138   < >  --    < > 143   < > 142  --  139 141  K  --    < > 3.8   < >  --    < > 3.5   < > 4.4  --  4.0 4.6  CL  --    < > 98   < >  --    < > 99   < > 102  --  101 106  CO2  --    < > 30   < >  --    < > 34*   < > 26  --  30 27  GLUCOSE  --    < > 105*   < >  --    < > 103*   < >  163*  --  114* 111*  BUN  --    < > 36*   < >  --    < > 37*   < > 38*  --  37* 35*  CREATININE 1.66*   < > 1.71*   < >  --    < > 1.73*   < > 1.53*  --  1.52* 1.58*  CALCIUM  --    < > 9.0   < >  --    < > 9.0   < > 9.0  --  9.3 9.0  MG 2.3  --  2.0  --  2.3  --  2.0  --   --   --   --   --   PHOS 4.2  --   --   --  4.2  --   --   --   --   --   --   --   TSH 8.881*  --   --    < >  --   --  7.923*  --   --  18.300* 9.30*  --    < > = values in this interval not displayed.   Liver Function Tests: Recent Labs    10/11/18 1715 10/11/18 1715 11/19/18 1018 01/22/19 1335 01/23/19 0251  AST 22   < > 17 20 18   ALT 19   < > 10 11 14   ALKPHOS 80  --   --  75 68  BILITOT 1.3*   < > 1.2 0.7 1.1   PROT 6.9   < > 6.6 6.6 5.8*  ALBUMIN 3.8  --   --  3.7 3.4*   < > = values in this interval not displayed.   Recent Labs    10/11/18 1715  LIPASE 51   No results for input(s): AMMONIA in the last 8760 hours. CBC: Recent Labs    10/24/18 0909 10/24/18 0909 11/19/18 1018 01/22/19 1145 01/30/19 1423 02/05/19 1035 04/24/19 1137 05/23/19 1209 06/02/19 1234  WBC 4.4   < > 5.9   < > 6.1   < > 5.5 7.6 5.5  NEUTROABS 2,328  --  2,985  --  3,996  --   --   --   --   HGB 9.6*   < > 10.2*   < > 11.7   < > 10.5* 9.5* 10.0*  HCT 29.5*   < > 31.0*   < > 34.7*   < > 33.2* 28.1* 29.6*  MCV 101.0*   < > 101.3*   < > 100.6*   < > 106.8* 100* 99*  PLT 220   < > 132*   < > 193   < > 239 174 248   < > = values in this interval not displayed.   Lipid Panel: No results for input(s): CHOL, HDL, LDLCALC, TRIG, CHOLHDL, LDLDIRECT in the last 8760 hours. TSH: Recent Labs    01/24/19 0326 03/13/19 1557 04/14/19 1054  TSH 7.923* 18.300* 9.30*   A1C: No results found for: HGBA1C   Assessment/Plan 1. Fall, initial encounter Pt with unsteady gait using walker and cane- mostly cane in the home due to not being able to use walker without bumping into things. Educated on fall precautions. She is no longer on anticoagulation. She has several bruised areas but no injury noted. Strict return precautions given.   2. Unsteady gait -encouraged use of assistive device. Fall precautions discussed in details.   3. Skin  tear of left lower leg without complication, initial encounter Slowly healing. To continue current treatment with tegaderm and to monitor for signs of infection.    Next appt: 07/16/2019 as scheduled, sooner If needed Ishika Chesterfield K. Harle Battiest  [facility]  I personally was present during the history, physical exam and medical decision-making activities of this service and have verified that the service and findings are accurately documented in the student's  note  [phone]

## 2019-06-13 NOTE — Progress Notes (Deleted)
Careteam: Patient Care Team: Gayland Curry, DO as PCP - General (Geriatric Medicine) Sanda Klein, MD as PCP - Cardiology (Cardiology)  PLACE OF SERVICE:  [place]  Advanced Directive information    Allergies  Allergen Reactions  . Accupril [Quinapril Hcl] Other (See Comments)    Unknown reaction   . Amlodipine Swelling  . Benadryl [Diphenhydramine Hcl] Other (See Comments)    Unknown reaction   . Biaxin [Clarithromycin] Other (See Comments)    Unknown reaction   . Ciprofloxacin Nausea And Vomiting  . Codeine Other (See Comments)    Unknown reaction   . Diovan [Valsartan] Other (See Comments)    Unknown reaction   . Medrol [Methylprednisolone] Other (See Comments)    Unknown  . Morphine And Related Other (See Comments)    Unknown reaction   . Neurontin [Gabapentin] Other (See Comments)    Unknown reaction   . Penicillins     Unknown reaction  Did it involve swelling of the face/tongue/throat, SOB, or low BP? Unknown Did it involve sudden or severe rash/hives, skin peeling, or any reaction on the inside of your mouth or nose? Unknown Did you need to seek medical attention at a hospital or doctor's office? Unknown When did it last happen?unknown If all above answers are "NO", may proceed with cephalosporin use.      No chief complaint on file.    HPI: Patient is a 86 y.o. female ***  Review of Systems:  ROS***  Past Medical History:  Diagnosis Date  . Aneurysm (arteriovenous) of coronary vessels    ascending aorta requiring bypass of the LAD  . Carotid stenosis 04/28/08   Doppler: <40% stenosis bilateral  . Chronic kidney disease, stage 3   . Hyperlipemia   . Hypertension   . Hypertensive chronic kidney disease   . Hypothyroidism, unspecified 02/13/2018   Symptoms well controlled with current therapy  . Mild cognitive impairment, so stated 03/28/2018   Symptoms poorly controlled, needs frequent adjustments in treatment and dose monitoring   . Mild intermittent asthma    Symptoms controlled with difficulty, affecting daily functioning, needs ongoing monitoring  . Occlusion and stenosis of bilateral carotid arteries   . Pacemaker generator end of life 11/18/08   Intermittent high-grade atrioventricular block  . Peripheral arterial disease (HCC)    left ABI of 0.78  . Presence of permanent cardiac pacemaker 08/26/01   Sinus node dysfunction-[facility]  . Presence of xenogenic heart valve   . PVD (peripheral vascular disease) ([place])   . SSS (sick sinus syndrome) ([place])   . Status post ascending aortic aneurysm repair/AVR -  Medtronic Freestyle root 09/28/2014   ascending aorta requiring bypass of the LAD    Past Surgical History:  Procedure Laterality Date  . ABDOMINAL HYSTERECTOMY  1966  . APPENDECTOMY  1943  . ASCENDING AORTIC ANEURYSM REPAIR  09/22/97   porcine aortic root  . BREAST FIBROADENOMA SURGERY  11/89  . CARDIAC SURGERY  09/22/1997   aortic valve root repair with porcine at time of CABG  . CARDIOVERSION N/A 02/10/2019   Procedure: CARDIOVERSION;  Surgeon: Skeet Latch, MD;  Location: [facility];  Service: Cardiovascular;  Laterality: N/A;  . CORONARY ARTERY BYPASS GRAFT  09/22/97   LIMA to the LAD  . I & D EXTREMITY Left 08/15/2017   Procedure: IRRIGATION AND DEBRIDEMENT EXTREMITY;  Surgeon: Nicholes Stairs, MD;  Location: [facility];  Service: Orthopedics;  Laterality: Left;  . [place]  left  . NEPHRECTOMY  1958   right  . ORIF HUMERUS FRACTURE Left 08/15/2017   Procedure: OPEN REDUCTION INTERNAL FIXATION (ORIF) PROXIMAL HUMERUS FRACTURE;  Surgeon: Nicholes Stairs, MD;  Location: [place];  Service: Orthopedics;  Laterality: Left;  . OVARIAN CYST SURGERY  1948  . PACEMAKER GENERATOR CHANGE  11/18/08   [facility]  . PACEMAKER INSERTION  08/26/01   [facility]  . PPM GENERATOR CHANGEOUT N/A 05/16/2017   Procedure: PPM GENERATOR CHANGEOUT;  Surgeon: Sanda Klein, MD;  Location: [facility];  Service: Cardiovascular;  Laterality: N/A;   Social History:   reports that she has never smoked. She has never used smokeless tobacco. She reports that she does not drink alcohol or use drugs.  Family History  Problem Relation Age of Onset  . Heart attack Mother   . Heart attack Father   . Heart attack Brother   . Hyperlipidemia Sister   . Hypertension Sister     Medications: Patient's Medications  New Prescriptions   No medications on file  Previous Medications   ACETAMINOPHEN (TYLENOL) 500 MG TABLET    Take 500 mg by mouth every 6 (six) hours as needed for mild pain or headache.   ALBUTEROL (ACCUNEB) 0.63 MG/3ML NEBULIZER SOLUTION    Take 3 mLs (0.63 mg total) by nebulization every 6 (six) hours as needed for wheezing.   AMIODARONE (PACERONE) 200 MG TABLET    TAKE ONE TABLET BY MOUTH TWICE A DAY FOR 1 WEEK THEN REDUCE TO 1 TABLET DAILY THEREAFTER   BRIMONIDINE (ALPHAGAN) 0.2 % OPHTHALMIC SOLUTION    Place 1 drop into both eyes 2 (two) times daily.    CARVEDILOL (COREG) 3.125 MG TABLET    TAKE ONE TABLET BY MOUTH TWICE A DAY WITH FOOD   CHOLECALCIFEROL (VITAMIN D) 50 MCG (2000 UT) CAPS    Take 1 capsule by mouth at bedtime.    COENZYME Q10 (COQ10) 200 MG CAPS    Take 200 mg by mouth daily.   DIPHENHYDRAMINE-ACETAMINOPHEN (TYLENOL PM) 25-500 MG TABS TABLET    Take 0.5 tablets by mouth at bedtime as needed (sleep).   FUROSEMIDE (LASIX) 20 MG TABLET    Take 1 tablet (20 mg total) by mouth daily.   LIDOCAINE (LMX) 4 % CREAM    Apply 1 application topically daily as needed (pain).   VITAMIN B-12 (CYANOCOBALAMIN) 500 MCG TABLET    Take 500 mcg by mouth daily.  Modified Medications   No medications on file  Discontinued Medications   No medications on file    Physical Exam:  Vitals:   06/13/19 1514  BP: 140/82  Pulse: 60  Temp: 97.9 F (36.6 C)  TempSrc: Temporal  SpO2: 95%  Weight: 89 lb 9.6 oz (40.6 kg)  Height: 5\' 3"  (1.6 m)   Body mass index is 15.87 kg/m. Wt  Readings from Last 3 Encounters:  06/13/19 89 lb 9.6 oz (40.6 kg)  06/02/19 86 lb (39 kg)  05/23/19 88 lb (39.9 kg)    Physical Exam***  Labs reviewed: Basic Metabolic Panel: Recent Labs    10/11/18 2301 10/12/18 0426 10/15/18 0504 10/24/18 0909 01/22/19 1732 01/23/19 0251 01/24/19 0326 01/30/19 1423 03/13/19 1556 03/13/19 1557 04/14/19 1054 04/24/19 1137  NA  --    < > 138   < >  --    < > 143   < > 142  --  139 141  K  --    < > 3.8   < >  --    < >  3.5   < > 4.4  --  4.0 4.6  CL  --    < > 98   < >  --    < > 99   < > 102  --  101 106  CO2  --    < > 30   < >  --    < > 34*   < > 26  --  30 27  GLUCOSE  --    < > 105*   < >  --    < > 103*   < > 163*  --  114* 111*  BUN  --    < > 36*   < >  --    < > 37*   < > 38*  --  37* 35*  CREATININE 1.66*   < > 1.71*   < >  --    < > 1.73*   < > 1.53*  --  1.52* 1.58*  CALCIUM  --    < > 9.0   < >  --    < > 9.0   < > 9.0  --  9.3 9.0  MG 2.3  --  2.0  --  2.3  --  2.0  --   --   --   --   --   PHOS 4.2  --   --   --  4.2  --   --   --   --   --   --   --   TSH 8.881*  --   --    < >  --   --  7.923*  --   --  18.300* 9.30*  --    < > = values in this interval not displayed.   Liver Function Tests: Recent Labs    10/11/18 1715 10/11/18 1715 11/19/18 1018 01/22/19 1335 01/23/19 0251  AST 22   < > 17 20 18   ALT 19   < > 10 11 14   ALKPHOS 80  --   --  75 68  BILITOT 1.3*   < > 1.2 0.7 1.1  PROT 6.9   < > 6.6 6.6 5.8*  ALBUMIN 3.8  --   --  3.7 3.4*   < > = values in this interval not displayed.   Recent Labs    10/11/18 1715  LIPASE 51   No results for input(s): AMMONIA in the last 8760 hours. CBC: Recent Labs    10/24/18 0909 10/24/18 0909 11/19/18 1018 01/22/19 1145 01/30/19 1423 02/05/19 1035 04/24/19 1137 05/23/19 1209 06/02/19 1234  WBC 4.4   < > 5.9   < > 6.1   < > 5.5 7.6 5.5  NEUTROABS 2,328  --  2,985  --  3,996  --   --   --   --   HGB 9.6*   < > 10.2*   < > 11.7   < > 10.5* 9.5* 10.0*  HCT  29.5*   < > 31.0*   < > 34.7*   < > 33.2* 28.1* 29.6*  MCV 101.0*   < > 101.3*   < > 100.6*   < > 106.8* 100* 99*  PLT 220   < > 132*   < > 193   < > 239 174 248   < > = values in this interval not displayed.   Lipid Panel: No results for input(s): CHOL, HDL, LDLCALC, TRIG, CHOLHDL, LDLDIRECT in the last  8760 hours. TSH: Recent Labs    01/24/19 0326 03/13/19 1557 04/14/19 1054  TSH 7.923* 18.300* 9.30*   A1C: No results found for: HGBA1C   Assessment/Plan There are no diagnoses linked to this encounter.  Next appt: *** Beuna Bolding K. [place], [facility] [phone]

## 2019-06-15 ENCOUNTER — Emergency Department (HOSPITAL_COMMUNITY): Payer: Medicare Other

## 2019-06-15 ENCOUNTER — Emergency Department (HOSPITAL_COMMUNITY)
Admission: EM | Admit: 2019-06-15 | Discharge: 2019-06-15 | Disposition: A | Discharge status: Home / Self Care | Payer: Medicare Other | Source: Home / Self Care | Attending: Emergency Medicine | Admitting: Emergency Medicine

## 2019-06-15 ENCOUNTER — Encounter (HOSPITAL_COMMUNITY): Payer: Self-pay | Admitting: Emergency Medicine

## 2019-06-15 ENCOUNTER — Other Ambulatory Visit: Payer: Self-pay

## 2019-06-15 DIAGNOSIS — E039 Hypothyroidism, unspecified: Secondary | ICD-10-CM | POA: Insufficient documentation

## 2019-06-15 DIAGNOSIS — J90 Pleural effusion, not elsewhere classified: Secondary | ICD-10-CM | POA: Diagnosis not present

## 2019-06-15 DIAGNOSIS — J9601 Acute respiratory failure with hypoxia: Secondary | ICD-10-CM | POA: Diagnosis not present

## 2019-06-15 DIAGNOSIS — I130 Hypertensive heart and chronic kidney disease with heart failure and stage 1 through stage 4 chronic kidney disease, or unspecified chronic kidney disease: Secondary | ICD-10-CM | POA: Insufficient documentation

## 2019-06-15 DIAGNOSIS — S22030D Wedge compression fracture of third thoracic vertebra, subsequent encounter for fracture with routine healing: Secondary | ICD-10-CM | POA: Insufficient documentation

## 2019-06-15 DIAGNOSIS — Y999 Unspecified external cause status: Secondary | ICD-10-CM | POA: Insufficient documentation

## 2019-06-15 DIAGNOSIS — S72141A Displaced intertrochanteric fracture of right femur, initial encounter for closed fracture: Secondary | ICD-10-CM | POA: Diagnosis not present

## 2019-06-15 DIAGNOSIS — W19XXXA Unspecified fall, initial encounter: Secondary | ICD-10-CM

## 2019-06-15 DIAGNOSIS — S8992XA Unspecified injury of left lower leg, initial encounter: Secondary | ICD-10-CM | POA: Diagnosis not present

## 2019-06-15 DIAGNOSIS — N179 Acute kidney failure, unspecified: Secondary | ICD-10-CM | POA: Diagnosis not present

## 2019-06-15 DIAGNOSIS — R079 Chest pain, unspecified: Secondary | ICD-10-CM | POA: Diagnosis not present

## 2019-06-15 DIAGNOSIS — Z79899 Other long term (current) drug therapy: Secondary | ICD-10-CM | POA: Insufficient documentation

## 2019-06-15 DIAGNOSIS — R0781 Pleurodynia: Secondary | ICD-10-CM

## 2019-06-15 DIAGNOSIS — Z950 Presence of cardiac pacemaker: Secondary | ICD-10-CM | POA: Insufficient documentation

## 2019-06-15 DIAGNOSIS — N184 Chronic kidney disease, stage 4 (severe): Secondary | ICD-10-CM | POA: Insufficient documentation

## 2019-06-15 DIAGNOSIS — Z20822 Contact with and (suspected) exposure to covid-19: Secondary | ICD-10-CM | POA: Diagnosis not present

## 2019-06-15 DIAGNOSIS — Y939 Activity, unspecified: Secondary | ICD-10-CM | POA: Insufficient documentation

## 2019-06-15 DIAGNOSIS — Z905 Acquired absence of kidney: Secondary | ICD-10-CM | POA: Insufficient documentation

## 2019-06-15 DIAGNOSIS — M25562 Pain in left knee: Secondary | ICD-10-CM | POA: Diagnosis not present

## 2019-06-15 DIAGNOSIS — R296 Repeated falls: Secondary | ICD-10-CM | POA: Insufficient documentation

## 2019-06-15 DIAGNOSIS — Y92009 Unspecified place in unspecified non-institutional (private) residence as the place of occurrence of the external cause: Secondary | ICD-10-CM | POA: Insufficient documentation

## 2019-06-15 DIAGNOSIS — S79911A Unspecified injury of right hip, initial encounter: Secondary | ICD-10-CM | POA: Diagnosis not present

## 2019-06-15 DIAGNOSIS — I2510 Atherosclerotic heart disease of native coronary artery without angina pectoris: Secondary | ICD-10-CM | POA: Insufficient documentation

## 2019-06-15 DIAGNOSIS — S72001A Fracture of unspecified part of neck of right femur, initial encounter for closed fracture: Secondary | ICD-10-CM | POA: Diagnosis not present

## 2019-06-15 DIAGNOSIS — M25552 Pain in left hip: Secondary | ICD-10-CM | POA: Insufficient documentation

## 2019-06-15 DIAGNOSIS — R0602 Shortness of breath: Secondary | ICD-10-CM | POA: Diagnosis not present

## 2019-06-15 DIAGNOSIS — S0012XD Contusion of left eyelid and periocular area, subsequent encounter: Secondary | ICD-10-CM | POA: Insufficient documentation

## 2019-06-15 DIAGNOSIS — M25551 Pain in right hip: Secondary | ICD-10-CM | POA: Diagnosis not present

## 2019-06-15 DIAGNOSIS — Z66 Do not resuscitate: Secondary | ICD-10-CM | POA: Diagnosis not present

## 2019-06-15 DIAGNOSIS — S199XXA Unspecified injury of neck, initial encounter: Secondary | ICD-10-CM | POA: Diagnosis not present

## 2019-06-15 DIAGNOSIS — I5032 Chronic diastolic (congestive) heart failure: Secondary | ICD-10-CM | POA: Insufficient documentation

## 2019-06-15 DIAGNOSIS — R0902 Hypoxemia: Secondary | ICD-10-CM | POA: Diagnosis not present

## 2019-06-15 DIAGNOSIS — J45909 Unspecified asthma, uncomplicated: Secondary | ICD-10-CM | POA: Insufficient documentation

## 2019-06-15 DIAGNOSIS — Z951 Presence of aortocoronary bypass graft: Secondary | ICD-10-CM | POA: Insufficient documentation

## 2019-06-15 DIAGNOSIS — S79912A Unspecified injury of left hip, initial encounter: Secondary | ICD-10-CM | POA: Diagnosis not present

## 2019-06-15 DIAGNOSIS — S0990XA Unspecified injury of head, initial encounter: Secondary | ICD-10-CM | POA: Diagnosis not present

## 2019-06-15 DIAGNOSIS — S270XXA Traumatic pneumothorax, initial encounter: Secondary | ICD-10-CM | POA: Diagnosis not present

## 2019-06-15 DIAGNOSIS — S0093XD Contusion of unspecified part of head, subsequent encounter: Secondary | ICD-10-CM | POA: Insufficient documentation

## 2019-06-15 MED ORDER — LIDOCAINE 5 % EX PTCH
1.0000 | MEDICATED_PATCH | CUTANEOUS | Status: DC
  Administered 2019-06-15: 14:00:00 1 via TRANSDERMAL
  Filled 2019-06-15: qty 1

## 2019-06-15 MED ORDER — ACETAMINOPHEN 325 MG PO TABS
650.0000 mg | ORAL_TABLET | Freq: Once | ORAL | Status: AC
  Administered 2019-06-15: 650 mg via ORAL
  Filled 2019-06-15: qty 2

## 2019-06-15 NOTE — ED Triage Notes (Signed)
Patient BIB son, reports left rib pain after fall last night. Seen last week after fall as well. Denies head injury and LOC.

## 2019-06-15 NOTE — Discharge Instructions (Addendum)
Your CT scan did not show any broken ribs or broken bones in your hip.  We did see signs of a compression fracture in your spine (thoracic), which I suspect is an old fracture.  You did not have tenderness over your spine on exam.  There is nothing to be done acutely for this injury.  We don't see broken ribs on your scan, but you may have a bruised rib or muscle injury.  I recommend taking tylenol at home for the next 7 days.  Follow up with your primary care provider in 1 week if you are still having significant pain.

## 2019-06-15 NOTE — ED Provider Notes (Signed)
[facility] DEPT Provider Note  CSN: [id number] Arrival date & time: 06/15/19  1058     History Chief Complaint  Patient presents with  . Fall    Loriana Samad Engelken is a 86 y.o. female presented to emergency department for mechanical fall yesterday evening.  She said she lost her footing getting out of bed trying to use the commode and fell to the ground in her bedroom.  She denies any head trauma yesterday.  However she reports she fell several days before and struck her head.  She reports that she was having left chest wall rib tenderness yesterday evening to this morning.  Her son noted that she was wincing a lot when he was trying to change her.  They are concerned about a rib fracture.  She was also was complaining about left hip pain this morning with ambulation, although she was able to bear weight and is able to range her hips.  She was formally on Eliquis for A. fib but stopped about 3 weeks ago.  She denies any other pain including neck pain or abdominal pain.  HPI     Past Medical History:  Diagnosis Date  . Aneurysm (arteriovenous) of coronary vessels    ascending aorta requiring bypass of the LAD  . Carotid stenosis 04/28/08   Doppler: <40% stenosis bilateral  . Chronic kidney disease, stage 3   . Hyperlipemia   . Hypertension   . Hypertensive chronic kidney disease   . Hypothyroidism, unspecified 02/13/2018   Symptoms well controlled with current therapy  . Mild cognitive impairment, so stated 03/28/2018   Symptoms poorly controlled, needs frequent adjustments in treatment and dose monitoring  . Mild intermittent asthma    Symptoms controlled with difficulty, affecting daily functioning, needs ongoing monitoring  . Occlusion and stenosis of bilateral carotid arteries   . Pacemaker generator end of life 11/18/08   Intermittent high-grade atrioventricular block  . Peripheral arterial disease (HCC)    left ABI of 0.78  . Presence of  permanent cardiac pacemaker 08/26/01   Sinus node dysfunction-St.Jude  . Presence of xenogenic heart valve   . PVD (peripheral vascular disease) ([place])   . SSS (sick sinus syndrome) ([place])   . Status post ascending aortic aneurysm repair/AVR -  Medtronic Freestyle root 09/28/2014   ascending aorta requiring bypass of the LAD     Patient Active Problem List   Diagnosis Date Noted  . CHB (complete heart block) ([place])   . Secondary hypercoagulable state ([place]) 04/24/2019  . Atypical atrial flutter ([place])   . Acute on chronic congestive heart failure ([place]) 01/22/2019  . Chronic kidney disease, stage 3   . Persistent atrial fibrillation ([place])   . Macrocytic anemia   . Acute on chronic diastolic CHF (congestive heart failure) ([place]) 10/11/2018  . Chronic kidney disease, stage 4 (severe) ([place]) 09/26/2018  . Intermittent claudication ([place]) 06/27/2018  . Mild intermittent asthma without complication 40/81/4481  . Senile osteoporosis 06/27/2018  . Bilateral bunions 03/28/2018  . Hyperthyroidism 03/20/2018  . Weight loss, unintentional 02/11/2018  . H/O hyperthyroidism 02/11/2018  . Memory loss, short term 02/11/2018  . Frailty syndrome in geriatric patient 02/11/2018  . Urge incontinence of urine 02/11/2018  . S/p nephrectomy 02/11/2018  . B12 deficiency 02/11/2018  . Elevated troponin   . Asthma 08/15/2017  . Open left humeral fracture 08/15/2017  . Fracture 08/15/2017  . Pacemaker battery depletion 05/17/2017  . Leg cramps 06/12/2016  . Peripheral arterial disease ([place])  12/08/2014  . Status post ascending aortic aneurysm repair/AVR -  Medtronic Freestyle root 09/28/2014  . CAD s/p CABG (LIMA to LAD) 07/08/2013  . History of aortic root repairand bioprosthetic AVR 07/08/2013  . Hyperlipidemia 07/08/2013  . SSS (sick sinus syndrome) ([place]) 07/08/2013  . Second degree AV block 07/08/2013  . Pacemaker, dual chamber St. Jude 2003/2010/2019 07/08/2013  . Essential hypertension 07/08/2013     Past Surgical History:  Procedure Laterality Date  . ABDOMINAL HYSTERECTOMY  1966  . APPENDECTOMY  1943  . ASCENDING AORTIC ANEURYSM REPAIR  09/22/97   porcine aortic root  . BREAST FIBROADENOMA SURGERY  11/89  . CARDIAC SURGERY  09/22/1997   aortic valve root repair with porcine at time of CABG  . CARDIOVERSION N/A 02/10/2019   Procedure: CARDIOVERSION;  Surgeon: Skeet Latch, MD;  Location: [facility];  Service: Cardiovascular;  Laterality: N/A;  . CORONARY ARTERY BYPASS GRAFT  09/22/97   LIMA to the LAD  . I & D EXTREMITY Left 08/15/2017   Procedure: IRRIGATION AND DEBRIDEMENT EXTREMITY;  Surgeon: Nicholes Stairs, MD;  Location: [facility];  Service: Orthopedics;  Laterality: Left;  . [place]   left  . NEPHRECTOMY  1958   right  . ORIF HUMERUS FRACTURE Left 08/15/2017   Procedure: OPEN REDUCTION INTERNAL FIXATION (ORIF) PROXIMAL HUMERUS FRACTURE;  Surgeon: Nicholes Stairs, MD;  Location: [facility];  Service: Orthopedics;  Laterality: Left;  . OVARIAN CYST SURGERY  1948  . PACEMAKER GENERATOR CHANGE  11/18/08   [employer]ude  . PACEMAKER INSERTION  08/26/01   [employer]  . PPM GENERATOR CHANGEOUT N/A 05/16/2017   Procedure: PPM GENERATOR CHANGEOUT;  Surgeon: Sanda Klein, MD;  Location: [facility];  Service: Cardiovascular;  Laterality: N/A;     OB History   No obstetric history on file.     Family History  Problem Relation Age of Onset  . Heart attack Mother   . Heart attack Father   . Heart attack Brother   . Hyperlipidemia Sister   . Hypertension Sister     Social History   Tobacco Use  . Smoking status: Never Smoker  . Smokeless tobacco: Never Used  Substance Use Topics  . Alcohol use: No  . Drug use: No    Home Medications Prior to Admission medications   Medication Sig Start Date End Date Taking? Authorizing Provider  acetaminophen (TYLENOL) 500 MG tablet Take 500 mg by mouth every 6 (six) hours as needed for mild pain or  headache.    [provider]  albuterol (ACCUNEB) 0.63 MG/3ML nebulizer solution Take 3 mLs (0.63 mg total) by nebulization every 6 (six) hours as needed for wheezing. 02/17/19   Reed, Tiffany L, DO  amiodarone (PACERONE) 200 MG tablet TAKE ONE TABLET BY MOUTH TWICE A DAY FOR 1 WEEK THEN REDUCE TO 1 TABLET DAILY THEREAFTER 06/12/19   Croitoru, Mihai, MD  brimonidine (ALPHAGAN) 0.2 % ophthalmic solution Place 1 drop into both eyes 2 (two) times daily.  03/31/16   [provider]  carvedilol (COREG) 3.125 MG tablet TAKE ONE TABLET BY MOUTH TWICE A DAY WITH FOOD Patient taking differently: Take 3.125 mg by mouth in the morning and at bedtime.  03/24/19   Reed, Tiffany L, DO  Cholecalciferol (VITAMIN D) 50 MCG (2000 UT) CAPS Take 1 capsule by mouth at bedtime.     [provider]  Coenzyme Q10 (COQ10) 200 MG CAPS Take 200 mg by mouth daily.  [provider]  diphenhydramine-acetaminophen (TYLENOL PM) 25-500 MG TABS tablet Take 0.5 tablets by mouth at bedtime as needed (sleep).    [provider]  furosemide (LASIX) 20 MG tablet Take 1 tablet (20 mg total) by mouth daily. 03/25/19   Reed, Tiffany L, DO  lidocaine (LMX) 4 % cream Apply 1 application topically daily as needed (pain).    [provider]  vitamin B-12 (CYANOCOBALAMIN) 500 MCG tablet Take 500 mcg by mouth daily.    [provider]    Allergies    Accupril [quinapril hcl], Amlodipine, Benadryl [diphenhydramine hcl], Biaxin [clarithromycin], Ciprofloxacin, Codeine, Diovan [valsartan], Medrol [methylprednisolone], Morphine and related, Neurontin [gabapentin], and Penicillins  Review of Systems   Review of Systems  Constitutional: Negative for chills and fever.  Eyes: Negative for pain and visual disturbance.  Respiratory: Negative for cough and shortness of breath.   Cardiovascular: Positive for chest pain. Negative for palpitations.  Gastrointestinal: Negative for abdominal pain,  nausea and vomiting.  Genitourinary: Negative for dysuria and hematuria.  Musculoskeletal: Positive for arthralgias and myalgias. Negative for back pain.  Skin: Negative for rash and wound.  Neurological: Negative for syncope and light-headedness.  All other systems reviewed and are negative.   Physical Exam Updated Vital Signs BP (!) 144/99   Pulse 61   Temp (!) 97.5 F (36.4 C) (Oral)   Resp 18   Ht 5\' 3"  (1.6 m)   Wt 39 kg   SpO2 93%   BMI 15.23 kg/m   Physical Exam Vitals and nursing note reviewed.  Constitutional:      General: She is not in acute distress.    Appearance: She is well-developed.     Comments: Thin, frail  HENT:     Head: Normocephalic.     Comments: Bruising around left eye Eyes:     Conjunctiva/sclera: Conjunctivae normal.  Cardiovascular:     Rate and Rhythm: Normal rate and regular rhythm.     Pulses: Normal pulses.     Comments: Left anterior chest wall tenderness under breast Pulmonary:     Effort: Pulmonary effort is normal. No respiratory distress.     Breath sounds: Normal breath sounds.  Abdominal:     Palpations: Abdomen is soft.     Tenderness: There is no abdominal tenderness.  Musculoskeletal:     Cervical back: Neck supple.     Comments: Kyphosis of the spine No spinal midline tenderness Full ROM at the bilateral hips without pain, 5/5 strength in lower extremities Mild TTP with palpation/rocking of the left pubic crest, no pelvic instability  Skin:    General: Skin is warm and dry.  Neurological:     General: No focal deficit present.     Mental Status: She is alert and oriented to person, place, and time.     Comments: Normal strength in lower extremities     ED Results / Procedures / Treatments   Labs (all labs ordered are listed, but only abnormal results are displayed) Labs Reviewed - No data to display  EKG None  Radiology CT Head Wo Contrast  Result Date: 06/15/2019 CLINICAL DATA:  Fall, head trauma EXAM: CT  HEAD WITHOUT CONTRAST TECHNIQUE: Contiguous axial images were obtained from the base of the skull through the vertex without intravenous contrast. COMPARISON:  06/01/2011 FINDINGS: Brain: No evidence of acute infarction, hemorrhage, hydrocephalus, extra-axial collection or mass lesion/mass effect. Periventricular and deep white matter hypodensity with redemonstrated encephalomalacia of the right frontal lobe. Global volume loss.  Vascular: No hyperdense vessel or unexpected calcification. Skull: Normal. Negative for fracture or focal lesion. Sinuses/Orbits: No acute finding. Other: None. IMPRESSION: 1.  No acute intracranial pathology. 2. Advanced small-vessel white matter disease and global volume loss. Unchanged encephalomalacia of the right frontal lobe. Electronically Signed   By: Eddie Candle M.D.   On: 06/15/2019 14:03   CT Chest Wo Contrast  Result Date: 06/15/2019 CLINICAL DATA:  Post trauma with left anterior chest wall tenderness after fall last night. EXAM: CT CHEST WITHOUT CONTRAST TECHNIQUE: Multidetector CT imaging of the chest was performed following the standard protocol without IV contrast. COMPARISON:  None. FINDINGS: Cardiovascular: Prior median sternotomy. Patient is moderately kyphotic mild cardiomegaly. Calcified plaque over the coronary arteries. Mild aneurysmal dilatation of the proximal aspect of the aortic arch measuring 4.1 cm in AP diameter. Descending thoracic aorta is normal in caliber. Evidence of previous aortic root repair. Moderate calcified plaque throughout the thoracic aorta. Common takeoff of the right brachiocephalic and left common carotid arteries. Remaining vascular structures are unremarkable. Mediastinum/Nodes: 1.2 cm precarinal lymph node likely reactive. No other significant mediastinal or hilar adenopathy. Remaining mediastinal structures are unremarkable. Lungs/Pleura: Lungs are adequately inflated demonstrate a moderate size left effusion and small right effusion  with associated dependent atelectasis. There is an oval 8 x 5 mm nodule over the right middle lobe. Airways are unremarkable. Upper Abdomen: Prior cholecystectomy. Calcified plaque over the abdominal aorta. Musculoskeletal: No definite rib fracture. Moderate kyphosis of the thoracic spine with degenerative changes throughout the spine. Mild compression fracture of T3 age indeterminate. IMPRESSION: 1.  No acute fracture. 2. Moderate left effusion and small right effusion with associated dependent atelectasis. 3. Postsurgical change compatible previous aortic root repair. Mild aneurysmal dilatation of the proximal arch measuring 4.1 cm in diameter. Recommend annual imaging followup by CTA or MRA. This recommendation follows 2010 ACCF/AHA/AATS/ACR/ASA/SCA/SCAI/SIR/STS/SVM Guidelines for the Diagnosis and Management of Patients with Thoracic Aortic Disease. Circulation. 2010; 121: H417-E081. Aortic aneurysm NOS (ICD10-I71.9). 4. Aortic Atherosclerosis (ICD10-I70.0). Aortic aneurysm NOS (ICD10-I71.9). Atherosclerotic coronary artery disease. 5. 6.5 mm nodule over the right middle lobe. 1.2 cm precarinal lymph node likely reactive. Non-contrast chest CT at 6-12 months is recommended. If the nodule is stable at time of repeat CT, then future CT at 18-24 months (from today's scan) is considered optional for low-risk patients, but is recommended for high-risk patients. This recommendation follows the consensus statement: Guidelines for Management of Incidental Pulmonary Nodules Detected on CT Images: From the Fleischner Society 2017; Radiology 2017; 284:228-243. 6. Mild T3 compression fracture age indeterminate. Moderate kyphosis of the thoracic spine. Electronically Signed   By: Marin Olp M.D.   On: 06/15/2019 14:22   DG HIP UNILAT WITH PELVIS 2-3 VIEWS LEFT  Result Date: 06/15/2019 CLINICAL DATA:  Left knee pain, fall EXAM: DG HIP (WITH OR WITHOUT PELVIS) 2-3V LEFT COMPARISON:  None. FINDINGS: There is no acute  fracture or dislocation. Degenerative changes are present. Vascular calcification is noted. IMPRESSION: No acute fracture. Electronically Signed   By: Macy Mis M.D.   On: 06/15/2019 13:20    Procedures Procedures (including critical care time)  Medications Ordered in ED Medications  lidocaine (LIDODERM) 5 % 1 patch (1 patch Transdermal Patch Applied 06/15/19 1424)  acetaminophen (TYLENOL) tablet 650 mg (650 mg Oral Given 06/15/19 1423)    ED Course  I have reviewed the triage vital signs and the nursing notes.  Pertinent labs & imaging results that were available during my care of the patient  were reviewed by me and considered in my medical decision making (see chart for details).  86 yo female here w/ mechanical fall last night, with left anterior rib pain on exam.  Breath sounds equal.  Doubt PTX.  She also reports left hip pain.  1. Rib pain  - CT scan shows no acute fractures or PTX.  This is possibly a muscle bruise, tear, or contusion.  It is very reproducible on exam, and so I think it is muscularskeletal in etiology, and unlikely an underlying vascular injury or pulmonary injury. She felt better after lidoderm patch.  We'll manage conservatively with tylenol at home, can have PCP re-evaluation in 1 week.  2. T3 compression fracture - Likely chronic.  NO spinal tenderness on exam.  Movement near the spine does not reproduce chest wall pain.  3.  Head injury  - Bruising is from an earlier fall this week.  [place] without acute findings.  No headache.  Doubtful of ICH at this time.  4. Falls  - Appear to be mechanical, not syncope episodes.  Advised extra caution with her cane getting around.  Her son helps her at home.  5.  Left hip pain  - Excellent ROM at the hips with no pain on movement.  Only has mild pain with palpation of left hip.  No fracture on xray.  She can ambulate.  Doubt she has a clinically significant or operative injury here.  I personally reviewed her images  and agree with the radiologist interpreation   Additional history was provided by her son at bedside  Clinical Course as of Jun 14 1721  Sun Jun 15, 2019  1409  IMPRESSION: 1. No acute intracranial pathology.  2. Advanced small-vessel white matter disease and global volume loss. Unchanged encephalomalacia of the right frontal lobe.   [MT]  1409 IMPRESSION: No acute fracture.    [MT]  1429 6. Mild T3 compression fracture age indeterminate. Moderate kyphosis of the thoracic spine.   [MT]  0071 I reassessed the patient reports that her pain had improved.  She wants to go home.  I reviewed the films with her as well as her son at the bedside.  There are no acute findings.  We discussed the T3 compression fracture, I suspect this is old.  She does not have any focal tenderness of the thoracic spine on exam.  She is stable with a walker and will discharge her.   [MT]    Clinical Course User Index [MT] Obbie Lewallen, Carola Rhine, MD    Final Clinical Impression(s) / ED Diagnoses Final diagnoses:  Fall, initial encounter  Rib pain on right side  Compression fracture of T3 vertebra with routine healing, subsequent encounter    Rx / DC Orders ED Discharge Orders    None       Milton Sagona, Carola Rhine, MD 06/15/19 1723

## 2019-06-16 ENCOUNTER — Encounter: Payer: Self-pay | Admitting: Internal Medicine

## 2019-06-16 NOTE — Telephone Encounter (Signed)
Mr.Holly Bolton, Dr.Reed is out of the office today.she will need a face to face appointment to evaluate for Home health Physical therapy.Please schedule an appointment either MyChart visit or in office. Thank You  Zenda Herskowitz FNP-C

## 2019-06-16 NOTE — Telephone Encounter (Signed)
Message routed to Marlowe Sax, NP due to PCP Dr.Reed being out of office. Please Advise.

## 2019-06-18 ENCOUNTER — Emergency Department (HOSPITAL_COMMUNITY): Payer: Medicare Other

## 2019-06-18 ENCOUNTER — Inpatient Hospital Stay (HOSPITAL_COMMUNITY)
Admission: EM | Admit: 2019-06-18 | Discharge: 2019-06-24 | DRG: 480 | Disposition: A | Discharge status: Skilled Nursing Facility | Payer: Medicare Other | Attending: Internal Medicine | Admitting: Internal Medicine

## 2019-06-18 ENCOUNTER — Encounter (HOSPITAL_COMMUNITY): Payer: Self-pay | Admitting: Emergency Medicine

## 2019-06-18 ENCOUNTER — Ambulatory Visit: Payer: Medicare Other | Admitting: Family

## 2019-06-18 ENCOUNTER — Other Ambulatory Visit: Payer: Self-pay

## 2019-06-18 DIAGNOSIS — I4891 Unspecified atrial fibrillation: Secondary | ICD-10-CM | POA: Diagnosis not present

## 2019-06-18 DIAGNOSIS — M4854XA Collapsed vertebra, not elsewhere classified, thoracic region, initial encounter for fracture: Secondary | ICD-10-CM | POA: Diagnosis present

## 2019-06-18 DIAGNOSIS — W19XXXA Unspecified fall, initial encounter: Secondary | ICD-10-CM | POA: Diagnosis not present

## 2019-06-18 DIAGNOSIS — I5022 Chronic systolic (congestive) heart failure: Secondary | ICD-10-CM | POA: Diagnosis not present

## 2019-06-18 DIAGNOSIS — Y92009 Unspecified place in unspecified non-institutional (private) residence as the place of occurrence of the external cause: Secondary | ICD-10-CM | POA: Diagnosis not present

## 2019-06-18 DIAGNOSIS — Z888 Allergy status to other drugs, medicaments and biological substances status: Secondary | ICD-10-CM

## 2019-06-18 DIAGNOSIS — I495 Sick sinus syndrome: Secondary | ICD-10-CM | POA: Diagnosis present

## 2019-06-18 DIAGNOSIS — E039 Hypothyroidism, unspecified: Secondary | ICD-10-CM | POA: Diagnosis present

## 2019-06-18 DIAGNOSIS — Z20822 Contact with and (suspected) exposure to covid-19: Secondary | ICD-10-CM | POA: Diagnosis present

## 2019-06-18 DIAGNOSIS — Z880 Allergy status to penicillin: Secondary | ICD-10-CM

## 2019-06-18 DIAGNOSIS — R2681 Unsteadiness on feet: Secondary | ICD-10-CM | POA: Diagnosis not present

## 2019-06-18 DIAGNOSIS — Z9181 History of falling: Secondary | ICD-10-CM

## 2019-06-18 DIAGNOSIS — Z950 Presence of cardiac pacemaker: Secondary | ICD-10-CM

## 2019-06-18 DIAGNOSIS — R5381 Other malaise: Secondary | ICD-10-CM | POA: Diagnosis not present

## 2019-06-18 DIAGNOSIS — Z885 Allergy status to narcotic agent status: Secondary | ICD-10-CM

## 2019-06-18 DIAGNOSIS — I509 Heart failure, unspecified: Secondary | ICD-10-CM | POA: Diagnosis not present

## 2019-06-18 DIAGNOSIS — I081 Rheumatic disorders of both mitral and tricuspid valves: Secondary | ICD-10-CM | POA: Diagnosis present

## 2019-06-18 DIAGNOSIS — R41841 Cognitive communication deficit: Secondary | ICD-10-CM | POA: Diagnosis not present

## 2019-06-18 DIAGNOSIS — Z951 Presence of aortocoronary bypass graft: Secondary | ICD-10-CM

## 2019-06-18 DIAGNOSIS — J939 Pneumothorax, unspecified: Secondary | ICD-10-CM | POA: Diagnosis present

## 2019-06-18 DIAGNOSIS — Z90710 Acquired absence of both cervix and uterus: Secondary | ICD-10-CM

## 2019-06-18 DIAGNOSIS — E785 Hyperlipidemia, unspecified: Secondary | ICD-10-CM | POA: Diagnosis not present

## 2019-06-18 DIAGNOSIS — S72144A Nondisplaced intertrochanteric fracture of right femur, initial encounter for closed fracture: Secondary | ICD-10-CM

## 2019-06-18 DIAGNOSIS — D6869 Other thrombophilia: Secondary | ICD-10-CM | POA: Diagnosis present

## 2019-06-18 DIAGNOSIS — Z7982 Long term (current) use of aspirin: Secondary | ICD-10-CM

## 2019-06-18 DIAGNOSIS — R2689 Other abnormalities of gait and mobility: Secondary | ICD-10-CM | POA: Diagnosis not present

## 2019-06-18 DIAGNOSIS — M810 Age-related osteoporosis without current pathological fracture: Secondary | ICD-10-CM | POA: Diagnosis not present

## 2019-06-18 DIAGNOSIS — R0902 Hypoxemia: Secondary | ICD-10-CM | POA: Diagnosis not present

## 2019-06-18 DIAGNOSIS — D62 Acute posthemorrhagic anemia: Secondary | ICD-10-CM | POA: Diagnosis not present

## 2019-06-18 DIAGNOSIS — S199XXA Unspecified injury of neck, initial encounter: Secondary | ICD-10-CM | POA: Diagnosis not present

## 2019-06-18 DIAGNOSIS — Z01818 Encounter for other preprocedural examination: Secondary | ICD-10-CM | POA: Diagnosis not present

## 2019-06-18 DIAGNOSIS — J90 Pleural effusion, not elsewhere classified: Secondary | ICD-10-CM | POA: Diagnosis present

## 2019-06-18 DIAGNOSIS — N184 Chronic kidney disease, stage 4 (severe): Secondary | ICD-10-CM | POA: Diagnosis present

## 2019-06-18 DIAGNOSIS — I130 Hypertensive heart and chronic kidney disease with heart failure and stage 1 through stage 4 chronic kidney disease, or unspecified chronic kidney disease: Secondary | ICD-10-CM | POA: Diagnosis present

## 2019-06-18 DIAGNOSIS — Z66 Do not resuscitate: Secondary | ICD-10-CM | POA: Diagnosis present

## 2019-06-18 DIAGNOSIS — R296 Repeated falls: Secondary | ICD-10-CM | POA: Diagnosis not present

## 2019-06-18 DIAGNOSIS — N179 Acute kidney failure, unspecified: Secondary | ICD-10-CM | POA: Diagnosis present

## 2019-06-18 DIAGNOSIS — R451 Restlessness and agitation: Secondary | ICD-10-CM | POA: Diagnosis not present

## 2019-06-18 DIAGNOSIS — J4520 Mild intermittent asthma, uncomplicated: Secondary | ICD-10-CM | POA: Diagnosis present

## 2019-06-18 DIAGNOSIS — R7989 Other specified abnormal findings of blood chemistry: Secondary | ICD-10-CM | POA: Diagnosis not present

## 2019-06-18 DIAGNOSIS — S72144G Nondisplaced intertrochanteric fracture of right femur, subsequent encounter for closed fracture with delayed healing: Secondary | ICD-10-CM | POA: Diagnosis not present

## 2019-06-18 DIAGNOSIS — I739 Peripheral vascular disease, unspecified: Secondary | ICD-10-CM | POA: Diagnosis present

## 2019-06-18 DIAGNOSIS — W1830XA Fall on same level, unspecified, initial encounter: Secondary | ICD-10-CM | POA: Diagnosis present

## 2019-06-18 DIAGNOSIS — Z79899 Other long term (current) drug therapy: Secondary | ICD-10-CM

## 2019-06-18 DIAGNOSIS — Z681 Body mass index (BMI) 19 or less, adult: Secondary | ICD-10-CM | POA: Diagnosis not present

## 2019-06-18 DIAGNOSIS — D539 Nutritional anemia, unspecified: Secondary | ICD-10-CM | POA: Diagnosis not present

## 2019-06-18 DIAGNOSIS — I4819 Other persistent atrial fibrillation: Secondary | ICD-10-CM | POA: Diagnosis present

## 2019-06-18 DIAGNOSIS — M25551 Pain in right hip: Secondary | ICD-10-CM | POA: Diagnosis not present

## 2019-06-18 DIAGNOSIS — Z905 Acquired absence of kidney: Secondary | ICD-10-CM

## 2019-06-18 DIAGNOSIS — E440 Moderate protein-calorie malnutrition: Secondary | ICD-10-CM | POA: Diagnosis present

## 2019-06-18 DIAGNOSIS — S72001A Fracture of unspecified part of neck of right femur, initial encounter for closed fracture: Secondary | ICD-10-CM

## 2019-06-18 DIAGNOSIS — I442 Atrioventricular block, complete: Secondary | ICD-10-CM | POA: Diagnosis present

## 2019-06-18 DIAGNOSIS — Z7189 Other specified counseling: Secondary | ICD-10-CM | POA: Diagnosis not present

## 2019-06-18 DIAGNOSIS — Y9301 Activity, walking, marching and hiking: Secondary | ICD-10-CM | POA: Diagnosis present

## 2019-06-18 DIAGNOSIS — I2510 Atherosclerotic heart disease of native coronary artery without angina pectoris: Secondary | ICD-10-CM | POA: Diagnosis present

## 2019-06-18 DIAGNOSIS — S72141D Displaced intertrochanteric fracture of right femur, subsequent encounter for closed fracture with routine healing: Secondary | ICD-10-CM | POA: Diagnosis not present

## 2019-06-18 DIAGNOSIS — I5043 Acute on chronic combined systolic (congestive) and diastolic (congestive) heart failure: Secondary | ICD-10-CM | POA: Diagnosis not present

## 2019-06-18 DIAGNOSIS — R627 Adult failure to thrive: Secondary | ICD-10-CM | POA: Diagnosis not present

## 2019-06-18 DIAGNOSIS — M6281 Muscle weakness (generalized): Secondary | ICD-10-CM | POA: Diagnosis not present

## 2019-06-18 DIAGNOSIS — R52 Pain, unspecified: Secondary | ICD-10-CM | POA: Diagnosis not present

## 2019-06-18 DIAGNOSIS — R0602 Shortness of breath: Secondary | ICD-10-CM | POA: Diagnosis not present

## 2019-06-18 DIAGNOSIS — Q231 Congenital insufficiency of aortic valve: Secondary | ICD-10-CM | POA: Diagnosis not present

## 2019-06-18 DIAGNOSIS — E860 Dehydration: Secondary | ICD-10-CM | POA: Diagnosis present

## 2019-06-18 DIAGNOSIS — Z7401 Bed confinement status: Secondary | ICD-10-CM

## 2019-06-18 DIAGNOSIS — E43 Unspecified severe protein-calorie malnutrition: Secondary | ICD-10-CM | POA: Diagnosis not present

## 2019-06-18 DIAGNOSIS — I5042 Chronic combined systolic (congestive) and diastolic (congestive) heart failure: Secondary | ICD-10-CM | POA: Diagnosis not present

## 2019-06-18 DIAGNOSIS — Z9889 Other specified postprocedural states: Secondary | ICD-10-CM

## 2019-06-18 DIAGNOSIS — Z86018 Personal history of other benign neoplasm: Secondary | ICD-10-CM

## 2019-06-18 DIAGNOSIS — D631 Anemia in chronic kidney disease: Secondary | ICD-10-CM | POA: Diagnosis present

## 2019-06-18 DIAGNOSIS — R911 Solitary pulmonary nodule: Secondary | ICD-10-CM | POA: Diagnosis present

## 2019-06-18 DIAGNOSIS — Z419 Encounter for procedure for purposes other than remedying health state, unspecified: Secondary | ICD-10-CM

## 2019-06-18 DIAGNOSIS — R1312 Dysphagia, oropharyngeal phase: Secondary | ICD-10-CM | POA: Diagnosis not present

## 2019-06-18 DIAGNOSIS — N189 Chronic kidney disease, unspecified: Secondary | ICD-10-CM | POA: Diagnosis not present

## 2019-06-18 DIAGNOSIS — Z953 Presence of xenogenic heart valve: Secondary | ICD-10-CM

## 2019-06-18 DIAGNOSIS — I484 Atypical atrial flutter: Secondary | ICD-10-CM | POA: Diagnosis present

## 2019-06-18 DIAGNOSIS — Z8249 Family history of ischemic heart disease and other diseases of the circulatory system: Secondary | ICD-10-CM

## 2019-06-18 DIAGNOSIS — R278 Other lack of coordination: Secondary | ICD-10-CM | POA: Diagnosis not present

## 2019-06-18 DIAGNOSIS — Z8349 Family history of other endocrine, nutritional and metabolic diseases: Secondary | ICD-10-CM

## 2019-06-18 DIAGNOSIS — R54 Age-related physical debility: Secondary | ICD-10-CM | POA: Diagnosis present

## 2019-06-18 DIAGNOSIS — J9601 Acute respiratory failure with hypoxia: Secondary | ICD-10-CM | POA: Diagnosis present

## 2019-06-18 DIAGNOSIS — I10 Essential (primary) hypertension: Secondary | ICD-10-CM | POA: Diagnosis not present

## 2019-06-18 DIAGNOSIS — I272 Pulmonary hypertension, unspecified: Secondary | ICD-10-CM | POA: Diagnosis present

## 2019-06-18 DIAGNOSIS — S72141A Displaced intertrochanteric fracture of right femur, initial encounter for closed fracture: Secondary | ICD-10-CM | POA: Diagnosis present

## 2019-06-18 DIAGNOSIS — Z881 Allergy status to other antibiotic agents status: Secondary | ICD-10-CM

## 2019-06-18 DIAGNOSIS — S270XXA Traumatic pneumothorax, initial encounter: Secondary | ICD-10-CM | POA: Diagnosis not present

## 2019-06-18 DIAGNOSIS — S72001D Fracture of unspecified part of neck of right femur, subsequent encounter for closed fracture with routine healing: Secondary | ICD-10-CM | POA: Diagnosis not present

## 2019-06-18 DIAGNOSIS — S0990XA Unspecified injury of head, initial encounter: Secondary | ICD-10-CM | POA: Diagnosis not present

## 2019-06-18 DIAGNOSIS — R410 Disorientation, unspecified: Secondary | ICD-10-CM | POA: Diagnosis not present

## 2019-06-18 DIAGNOSIS — R079 Chest pain, unspecified: Secondary | ICD-10-CM | POA: Diagnosis not present

## 2019-06-18 DIAGNOSIS — J45909 Unspecified asthma, uncomplicated: Secondary | ICD-10-CM | POA: Diagnosis not present

## 2019-06-18 DIAGNOSIS — J9811 Atelectasis: Secondary | ICD-10-CM | POA: Diagnosis present

## 2019-06-18 DIAGNOSIS — S79911A Unspecified injury of right hip, initial encounter: Secondary | ICD-10-CM | POA: Diagnosis not present

## 2019-06-18 DIAGNOSIS — M2550 Pain in unspecified joint: Secondary | ICD-10-CM | POA: Diagnosis not present

## 2019-06-18 LAB — CBC WITH DIFFERENTIAL/PLATELET
Abs Immature Granulocytes: 0.06 10*3/uL (ref 0.00–0.07)
Basophils Absolute: 0 10*3/uL (ref 0.0–0.1)
Basophils Relative: 0 %
Eosinophils Absolute: 0.2 10*3/uL (ref 0.0–0.5)
Eosinophils Relative: 3 %
HCT: 33.1 % — ABNORMAL LOW (ref 36.0–46.0)
Hemoglobin: 10.4 g/dL — ABNORMAL LOW (ref 12.0–15.0)
Immature Granulocytes: 1 %
Lymphocytes Relative: 7 %
Lymphs Abs: 0.5 10*3/uL — ABNORMAL LOW (ref 0.7–4.0)
MCH: 33 pg (ref 26.0–34.0)
MCHC: 31.4 g/dL (ref 30.0–36.0)
MCV: 105.1 fL — ABNORMAL HIGH (ref 80.0–100.0)
Monocytes Absolute: 1.2 10*3/uL — ABNORMAL HIGH (ref 0.1–1.0)
Monocytes Relative: 16 %
Neutro Abs: 5.4 10*3/uL (ref 1.7–7.7)
Neutrophils Relative %: 73 %
Platelets: 221 10*3/uL (ref 150–400)
RBC: 3.15 MIL/uL — ABNORMAL LOW (ref 3.87–5.11)
RDW: 17.2 % — ABNORMAL HIGH (ref 11.5–15.5)
WBC: 7.4 10*3/uL (ref 4.0–10.5)
nRBC: 0.4 % — ABNORMAL HIGH (ref 0.0–0.2)

## 2019-06-18 LAB — COMPREHENSIVE METABOLIC PANEL
ALT: 20 U/L (ref 0–44)
AST: 23 U/L (ref 15–41)
Albumin: 3.3 g/dL — ABNORMAL LOW (ref 3.5–5.0)
Alkaline Phosphatase: 102 U/L (ref 38–126)
Anion gap: 10 (ref 5–15)
BUN: 45 mg/dL — ABNORMAL HIGH (ref 8–23)
CO2: 27 mmol/L (ref 22–32)
Calcium: 8.7 mg/dL — ABNORMAL LOW (ref 8.9–10.3)
Chloride: 104 mmol/L (ref 98–111)
Creatinine, Ser: 2.01 mg/dL — ABNORMAL HIGH (ref 0.44–1.00)
GFR calc Af Amer: 24 mL/min — ABNORMAL LOW (ref >60)
GFR calc non Af Amer: 21 mL/min — ABNORMAL LOW (ref >60)
Glucose, Bld: 129 mg/dL — ABNORMAL HIGH (ref 70–99)
Potassium: 4.4 mmol/L (ref 3.5–5.1)
Sodium: 141 mmol/L (ref 135–145)
Total Bilirubin: 0.9 mg/dL (ref 0.3–1.2)
Total Protein: 6.5 g/dL (ref 6.5–8.1)

## 2019-06-18 LAB — RESPIRATORY PANEL BY RT PCR (FLU A&B, COVID)
Influenza A by PCR: NEGATIVE
Influenza B by PCR: NEGATIVE
SARS Coronavirus 2 by RT PCR: NEGATIVE

## 2019-06-18 LAB — BRAIN NATRIURETIC PEPTIDE: B Natriuretic Peptide: 941.2 pg/mL — ABNORMAL HIGH (ref 0.0–100.0)

## 2019-06-18 MED ORDER — HYDROMORPHONE HCL 1 MG/ML IJ SOLN
0.5000 mg | Freq: Once | INTRAMUSCULAR | Status: AC
  Administered 2019-06-18: 0.5 mg via INTRAVENOUS
  Filled 2019-06-18: qty 1

## 2019-06-18 MED ORDER — POLYETHYLENE GLYCOL 3350 17 G PO PACK: 17.0000 g | PACK | Freq: Every day | ORAL | Status: DC | PRN

## 2019-06-18 MED ORDER — CARVEDILOL 3.125 MG PO TABS
3.1250 mg | ORAL_TABLET | Freq: Two times a day (BID) | ORAL | Status: DC
  Administered 2019-06-19 – 2019-06-22 (×3): 3.125 mg via ORAL
  Filled 2019-06-18 (×4): qty 1

## 2019-06-18 MED ORDER — ACETAMINOPHEN 500 MG PO TABS: 1000.0000 mg | ORAL_TABLET | Freq: Three times a day (TID) | ORAL | Status: DC | PRN

## 2019-06-18 MED ORDER — FUROSEMIDE 20 MG PO TABS
20.0000 mg | ORAL_TABLET | Freq: Every day | ORAL | Status: DC
  Administered 2019-06-20: 20 mg via ORAL
  Filled 2019-06-18: qty 1

## 2019-06-18 MED ORDER — SODIUM CHLORIDE 0.9% FLUSH
3.0000 mL | Freq: Two times a day (BID) | INTRAVENOUS | Status: DC
  Administered 2019-06-21 – 2019-06-22 (×2): 3 mL via INTRAVENOUS

## 2019-06-18 MED ORDER — DIPHENHYDRAMINE HCL 12.5 MG/5ML PO ELIX: 12.5000 mg | ORAL_SOLUTION | Freq: Every evening | ORAL | Status: DC | PRN

## 2019-06-18 MED ORDER — ALBUTEROL SULFATE (2.5 MG/3ML) 0.083% IN NEBU: 2.5000 mg | INHALATION_SOLUTION | RESPIRATORY_TRACT | Status: DC | PRN

## 2019-06-18 MED ORDER — SODIUM CHLORIDE 0.9 % IV SOLN: 250.0000 mL | INTRAVENOUS | Status: DC | PRN

## 2019-06-18 MED ORDER — BRIMONIDINE TARTRATE 0.2 % OP SOLN
1.0000 [drp] | Freq: Two times a day (BID) | OPHTHALMIC | Status: DC
  Administered 2019-06-18 – 2019-06-24 (×10): 1 [drp] via OPHTHALMIC
  Filled 2019-06-18: qty 5

## 2019-06-18 MED ORDER — AMIODARONE HCL 200 MG PO TABS
200.0000 mg | ORAL_TABLET | Freq: Every day | ORAL | Status: DC
  Administered 2019-06-20 – 2019-06-24 (×4): 200 mg via ORAL
  Filled 2019-06-18 (×6): qty 1

## 2019-06-18 MED ORDER — CYANOCOBALAMIN 250 MCG PO TABS
500.0000 ug | ORAL_TABLET | Freq: Every day | ORAL | Status: DC
  Administered 2019-06-20 – 2019-06-24 (×5): 500 ug via ORAL
  Filled 2019-06-18 (×4): qty 2
  Filled 2019-06-18: qty 1
  Filled 2019-06-18: qty 2

## 2019-06-18 MED ORDER — HYDROMORPHONE HCL 1 MG/ML IJ SOLN
0.5000 mg | Freq: Three times a day (TID) | INTRAMUSCULAR | Status: DC | PRN
  Administered 2019-06-18 – 2019-06-19 (×2): 0.5 mg via INTRAVENOUS
  Filled 2019-06-18 (×2): qty 0.5

## 2019-06-18 MED ORDER — ACETAMINOPHEN 650 MG RE SUPP: 650.0000 mg | Freq: Four times a day (QID) | RECTAL | Status: DC | PRN

## 2019-06-18 MED ORDER — SODIUM CHLORIDE 0.9 % IV BOLUS: 500.0000 mL | Freq: Once | INTRAVENOUS | Status: DC

## 2019-06-18 MED ORDER — VITAMIN D3 25 MCG (1000 UNIT) PO TABS
2000.0000 [IU] | ORAL_TABLET | Freq: Every day | ORAL | Status: DC
  Administered 2019-06-18 – 2019-06-23 (×6): 2000 [IU] via ORAL
  Filled 2019-06-18 (×6): qty 2

## 2019-06-18 MED ORDER — ACETAMINOPHEN 325 MG PO TABS: 325.0000 mg | ORAL_TABLET | Freq: Every evening | ORAL | Status: DC | PRN

## 2019-06-18 MED ORDER — SODIUM CHLORIDE 0.9% FLUSH
3.0000 mL | Freq: Two times a day (BID) | INTRAVENOUS | Status: DC
  Administered 2019-06-19 – 2019-06-23 (×6): 3 mL via INTRAVENOUS

## 2019-06-18 MED ORDER — DOCUSATE SODIUM 100 MG PO CAPS
100.0000 mg | ORAL_CAPSULE | Freq: Two times a day (BID) | ORAL | Status: DC
  Administered 2019-06-18: 100 mg via ORAL
  Filled 2019-06-18: qty 1

## 2019-06-18 MED ORDER — ONDANSETRON HCL 4 MG/2ML IJ SOLN
4.0000 mg | Freq: Once | INTRAMUSCULAR | Status: AC
  Administered 2019-06-18: 4 mg via INTRAVENOUS
  Filled 2019-06-18: qty 2

## 2019-06-18 MED ORDER — DIPHENHYDRAMINE-APAP (SLEEP) 25-500 MG PO TABS: 0.5000 | ORAL_TABLET | Freq: Every evening | ORAL | Status: DC | PRN

## 2019-06-18 MED ORDER — SODIUM CHLORIDE 0.9 % IV BOLUS (SEPSIS)
250.0000 mL | Freq: Once | INTRAVENOUS | Status: AC
  Administered 2019-06-18: 14:00:00 250 mL via INTRAVENOUS

## 2019-06-18 MED ORDER — ASPIRIN 81 MG PO CHEW
81.0000 mg | CHEWABLE_TABLET | Freq: Every day | ORAL | Status: DC
  Administered 2019-06-18 – 2019-06-24 (×5): 81 mg via ORAL
  Filled 2019-06-18 (×6): qty 1

## 2019-06-18 MED ORDER — FENTANYL CITRATE (PF) 100 MCG/2ML IJ SOLN
50.0000 ug | Freq: Once | INTRAMUSCULAR | Status: AC
  Administered 2019-06-18: 50 ug via INTRAVENOUS
  Filled 2019-06-18: qty 2

## 2019-06-18 MED ORDER — SODIUM CHLORIDE 0.9% FLUSH: 3.0000 mL | INTRAVENOUS | Status: DC | PRN

## 2019-06-18 NOTE — Consult Note (Signed)
Reason for Consult:Right hip fracture Referring Physician: DR. Dannial Monarch is an 86 y.o. female.  HPI: 86 year old female status post fall today with right hip pain.  Initial x-rays were nondiagnostic but subsequent CT scan did show nondisplaced intertrochanteric fracture.  She complains of deep right hip pain worse with any movement.  Denies other injuries with the fall.  She has had a couple of falls in the last month.  She's had some worsening shortness of breath lately.  She stopped Xarelto about 2 weeks ago.  Past Medical History:  Diagnosis Date  . Aneurysm (arteriovenous) of coronary vessels    ascending aorta requiring bypass of the LAD  . Carotid stenosis 04/28/08   Doppler: <40% stenosis bilateral  . Chronic kidney disease, stage 3   . Hyperlipemia   . Hypertension   . Hypertensive chronic kidney disease   . Hypothyroidism, unspecified 02/13/2018   Symptoms well controlled with current therapy  . Mild cognitive impairment, so stated 03/28/2018   Symptoms poorly controlled, needs frequent adjustments in treatment and dose monitoring  . Mild intermittent asthma    Symptoms controlled with difficulty, affecting daily functioning, needs ongoing monitoring  . Occlusion and stenosis of bilateral carotid arteries   . Pacemaker generator end of life 11/18/08   Intermittent high-grade atrioventricular block  . Peripheral arterial disease (HCC)    left ABI of 0.78  . Presence of permanent cardiac pacemaker 08/26/01   Sinus node dysfunction-[place]  . Presence of xenogenic heart valve   . PVD (peripheral vascular disease) ([place])   . SSS (sick sinus syndrome) ([place])   . Status post ascending aortic aneurysm repair/AVR -  Medtronic Freestyle root 09/28/2014   ascending aorta requiring bypass of the LAD     Past Surgical History:  Procedure Laterality Date  . ABDOMINAL HYSTERECTOMY  1966  . APPENDECTOMY  1943  . ASCENDING AORTIC ANEURYSM REPAIR  09/22/97   porcine aortic  root  . BREAST FIBROADENOMA SURGERY  11/89  . CARDIAC SURGERY  09/22/1997   aortic valve root repair with porcine at time of CABG  . CARDIOVERSION N/A 02/10/2019   Procedure: CARDIOVERSION;  Surgeon: Skeet Latch, MD;  Location: [facility];  Service: Cardiovascular;  Laterality: N/A;  . CORONARY ARTERY BYPASS GRAFT  09/22/97   LIMA to the LAD  . I & D EXTREMITY Left 08/15/2017   Procedure: IRRIGATION AND DEBRIDEMENT EXTREMITY;  Surgeon: Nicholes Stairs, MD;  Location: [facility];  Service: Orthopedics;  Laterality: Left;  . [place]   left  . NEPHRECTOMY  1958   right  . ORIF HUMERUS FRACTURE Left 08/15/2017   Procedure: OPEN REDUCTION INTERNAL FIXATION (ORIF) PROXIMAL HUMERUS FRACTURE;  Surgeon: Nicholes Stairs, MD;  Location: [facility];  Service: Orthopedics;  Laterality: Left;  . OVARIAN CYST SURGERY  1948  . PACEMAKER GENERATOR CHANGE  11/18/08   [facility]  . PACEMAKER INSERTION  08/26/01   [facility]  . PPM GENERATOR CHANGEOUT N/A 05/16/2017   Procedure: PPM GENERATOR CHANGEOUT;  Surgeon: Sanda Klein, MD;  Location: [facility];  Service: Cardiovascular;  Laterality: N/A;    Family History  Problem Relation Age of Onset  . Heart attack Mother   . Heart attack Father   . Heart attack Brother   . Hyperlipidemia Sister   . Hypertension Sister     Social History:  reports that she has never smoked. She has never used smokeless tobacco. She reports that she does not  drink alcohol or use drugs.  Allergies:  Allergies  Allergen Reactions  . Accupril [Quinapril Hcl] Other (See Comments)    Unknown reaction   . Amlodipine Swelling  . Benadryl [Diphenhydramine Hcl] Other (See Comments)    Unknown reaction   . Biaxin [Clarithromycin] Other (See Comments)    Unknown reaction   . Ciprofloxacin Nausea And Vomiting  . Codeine Other (See Comments)    Unknown reaction   . Diovan [Valsartan] Other (See Comments)    Unknown reaction   . Medrol  [Methylprednisolone] Other (See Comments)    Unknown  . Morphine And Related Other (See Comments)    Unknown reaction   . Neurontin [Gabapentin] Other (See Comments)    Unknown reaction   . Penicillins     Unknown reaction  Did it involve swelling of the face/tongue/throat, SOB, or low BP? Unknown Did it involve sudden or severe rash/hives, skin peeling, or any reaction on the inside of your mouth or nose? Unknown Did you need to seek medical attention at a hospital or doctor's office? Unknown When did it last happen?unknown If all above answers are "NO", may proceed with cephalosporin use.      Medications: I have reviewed the patient's current medications.  Results for orders placed or performed during the hospital encounter of 06/18/19 (from the past 48 hour(s))  CBC with Differential/Platelet     Status: Abnormal   Collection Time: 06/18/19 10:48 AM  Result Value Ref Range   WBC 7.4 4.0 - 10.5 K/uL   RBC 3.15 (L) 3.87 - 5.11 MIL/uL   Hemoglobin 10.4 (L) 12.0 - 15.0 g/dL   HCT 33.1 (L) 36.0 - 46.0 %   MCV 105.1 (H) 80.0 - 100.0 fL   MCH 33.0 26.0 - 34.0 pg   MCHC 31.4 30.0 - 36.0 g/dL   RDW 17.2 (H) 11.5 - 15.5 %   Platelets 221 150 - 400 K/uL   nRBC 0.4 (H) 0.0 - 0.2 %   Neutrophils Relative % 73 %   Neutro Abs 5.4 1.7 - 7.7 K/uL   Lymphocytes Relative 7 %   Lymphs Abs 0.5 (L) 0.7 - 4.0 K/uL   Monocytes Relative 16 %   Monocytes Absolute 1.2 (H) 0.1 - 1.0 K/uL   Eosinophils Relative 3 %   Eosinophils Absolute 0.2 0.0 - 0.5 K/uL   Basophils Relative 0 %   Basophils Absolute 0.0 0.0 - 0.1 K/uL   Immature Granulocytes 1 %   Abs Immature Granulocytes 0.06 0.00 - 0.07 K/uL    Comment: Performed at [facility], [place] [address] [place], [place] [address]  Comprehensive metabolic panel     Status: Abnormal   Collection Time: 06/18/19 10:48 AM  Result Value Ref Range   Sodium 141 135 - 145 mmol/L   Potassium 4.4 3.5 - 5.1 mmol/L   Chloride 104  98 - 111 mmol/L   CO2 27 22 - 32 mmol/L   Glucose, Bld 129 (H) 70 - 99 mg/dL    Comment: Glucose reference range applies only to samples taken after fasting for at least 8 hours.   BUN 45 (H) 8 - 23 mg/dL   Creatinine, Ser 2.01 (H) 0.44 - 1.00 mg/dL   Calcium 8.7 (L) 8.9 - 10.3 mg/dL   Total Protein 6.5 6.5 - 8.1 g/dL   Albumin 3.3 (L) 3.5 - 5.0 g/dL   AST 23 15 - 41 U/L   ALT 20 0 - 44 U/L   Alkaline Phosphatase  102 38 - 126 U/L   Total Bilirubin 0.9 0.3 - 1.2 mg/dL   GFR calc non Af Amer 21 (L) >60 mL/min   GFR calc Af Amer 24 (L) >60 mL/min   Anion gap 10 5 - 15    Comment: Performed at [facility], [place] [address] [place], [place] [address]  Brain natriuretic peptide     Status: Abnormal   Collection Time: 06/18/19 10:48 AM  Result Value Ref Range   B Natriuretic Peptide 941.2 (H) 0.0 - 100.0 pg/mL    Comment: Performed at [facility], [place] [address], [place], [place] [address]  Respiratory Panel by RT PCR (Flu A&B, Covid) - Nasopharyngeal Swab     Status: None   Collection Time: 06/18/19  3:21 PM   Specimen: Nasopharyngeal Swab  Result Value Ref Range   SARS Coronavirus 2 by RT PCR NEGATIVE NEGATIVE    Comment: (NOTE) SARS-CoV-2 target nucleic acids are NOT DETECTED. The SARS-CoV-2 RNA is generally detectable in upper respiratoy specimens during the acute phase of infection. The lowest concentration of SARS-CoV-2 viral copies this assay can detect is 131 copies/mL. A negative result does not preclude SARS-Cov-2 infection and should not be used as the sole basis for treatment or other patient management decisions. A negative result may occur with  improper specimen collection/handling, submission of specimen other than nasopharyngeal swab, presence of viral mutation(s) within the areas targeted by this assay, and inadequate number of viral copies (<131 copies/mL). A negative result must be combined with clinical observations, patient  history, and epidemiological information. The expected result is Negative. Fact Sheet for Patients:  PinkCheek.be Fact Sheet for Healthcare Providers:  GravelBags.it This test is not yet ap proved or cleared by the [place] FDA and  has been authorized for detection and/or diagnosis of SARS-CoV-2 by FDA under an Emergency Use Authorization (EUA). This EUA will remain  in effect (meaning this test can be used) for the duration of the COVID-19 declaration under Section 564(b)(1) of the Act, 21 U.S.C. section 360bbb-3(b)(1), unless the authorization is terminated or revoked sooner.    Influenza A by PCR NEGATIVE NEGATIVE   Influenza B by PCR NEGATIVE NEGATIVE    Comment: (NOTE) The Xpert Xpress SARS-CoV-2/FLU/RSV assay is intended as an aid in  the diagnosis of influenza from Nasopharyngeal swab specimens and  should not be used as a sole basis for treatment. Nasal washings and  aspirates are unacceptable for Xpert Xpress SARS-CoV-2/FLU/RSV  testing. Fact Sheet for Patients: PinkCheek.be Fact Sheet for Healthcare Providers: GravelBags.it This test is not yet approved or cleared by the [place] FDA and  has been authorized for detection and/or diagnosis of SARS-CoV-2 by  FDA under an Emergency Use Authorization (EUA). This EUA will remain  in effect (meaning this test can be used) for the duration of the  Covid-19 declaration under Section 564(b)(1) of the Act, 21  U.S.C. section 360bbb-3(b)(1), unless the authorization is  terminated or revoked. Performed at [facility], [place] [address] [place],  [address]     CT Head Wo Contrast  Result Date: 06/18/2019 CLINICAL DATA:  Poly trauma, critical, head/cervical spine injury suspected. Ataxia, head trauma. Additional history provided: Patient has fallen multiple times in the past week.  EXAM: CT HEAD WITHOUT CONTRAST CT CERVICAL SPINE WITHOUT CONTRAST TECHNIQUE: Multidetector CT imaging of the head and cervical spine was performed following the standard protocol without intravenous contrast. Multiplanar CT image reconstructions of the cervical  spine were also generated. COMPARISON:  Head CT 06/15/2019, report from CT cervical spine 07/17/2011 (images unavailable) FINDINGS: CT HEAD FINDINGS Brain: The examination is mildly motion degraded. Redemonstrated small focus of cortical/subcortical encephalomalacia within the right frontal lobe which may reflect a remote infarct. Background mild ill-defined hypoattenuation within the cerebral white matter is nonspecific, but consistent with chronic small vessel ischemic disease. Stable, moderate generalized parenchymal atrophy. There is no acute intracranial hemorrhage. No extra-axial fluid collection. No evidence of intracranial mass. No midline shift. Vascular: No hyperdense vessel.  Atherosclerotic calcifications. Skull: Normal. Negative for fracture or focal lesion. Sinuses/Orbits: Visualized orbits show no acute finding. No significant paranasal sinus disease or mastoid effusion at the imaged levels. CT CERVICAL SPINE FINDINGS Alignment: C3-C4 and C4-C5 grade 1 anterolisthesis. Skull base and vertebrae: The basion-dental and atlanto-dental intervals are maintained.No evidence of acute fracture to the cervical spine. Congenital nonunion of the posterior arch of C1. Soft tissues and spinal canal: No prevertebral fluid or swelling. No visible canal hematoma. Disc levels: Cervical spondylosis. Most notably at C5-C6 there is moderate/advanced disc height loss with posterior disc osteophyte complexes and uncovertebral hypertrophy. Upper chest: Separately reported. There is a small right apical pneumothorax. Partially imaged left pleural effusion. Other: Subcentimeter thyroid nodules not meeting consensus criteria for ultrasound follow-up. Findings of right  apical pneumothorax called by telephone at the time of interpretation on 06/18/2019 at 3:30 pm to provider PA Nogal, who verbally acknowledged these results. IMPRESSION: CT head: 1. Mildly motion degraded examination. 2. No evidence of acute intracranial abnormality. 3. Redemonstrated small focus of encephalomalacia within the right frontal lobe which may reflect a remote infarct. 4. Stable generalized parenchymal atrophy and chronic small vessel ischemic disease. CT cervical spine: 1. No evidence of acute fracture to the cervical spine. 2. C3-C4 and C4-C5 grade 1 anterolisthesis, possibly degenerative. 3. Cervical spondylosis, greatest at C5-C6 and C6-C7. 4. Right apical pneumothorax. Please refer to separately reported chest CT for additional intrathoracic findings. Electronically Signed   By: Kellie Simmering DO   On: 06/18/2019 15:32   CT Chest Wo Contrast  Result Date: 06/18/2019 CLINICAL DATA:  Chest pain and shortness of breath. Bilateral pleural effusions. EXAM: CT CHEST WITHOUT CONTRAST TECHNIQUE: Multidetector CT imaging of the chest was performed following the standard protocol without IV contrast. COMPARISON:  Chest x-rays dated 06/18/2019 and 06/10/2019 and chest CT dated 06/15/2019 FINDINGS: Cardiovascular: Chronic cardiomegaly. Previous aortic root repair. Unchanged dilatation of the aortic arch to a diameter of 4 cm, stable. No pericardial effusion. Pacemaker in place. CABG. Mediastinum/Nodes: Multinodular goiter, unchanged. Trachea is normal. Esophagus appears normal. No appreciable hilar or mediastinal or axillary adenopathy. Lungs/Pleura: Persistent large left effusion and small right effusion, essentially unchanged. Minimal compressive atelectasis at the lung bases, stable. No acute infiltrates. Stable nodule in the right middle lobe, 5 x 7 mm. Upper Abdomen: No acute abnormality. Musculoskeletal: Stable mild compression fracture of T3, unchanged. Marked chronic accentuation of the thoracic  kyphosis. No acute bone abnormality. IMPRESSION: 1. Stable large left effusion and small right effusion. 2. Stable cardiomegaly. 3. Stable dilatation of the aortic arch to a diameter of 4 cm. 4. Stable right middle lobe nodule. 5. Stable mild compression fracture of T3. 6. Marked chronic accentuation of the thoracic kyphosis. 7. Aortic atherosclerosis. Aortic Atherosclerosis (ICD10-I70.0). Electronically Signed   By: Lorriane Shire M.D.   On: 06/18/2019 15:32   CT Cervical Spine Wo Contrast  Result Date: 06/18/2019 CLINICAL DATA:  Poly trauma, critical, head/cervical spine injury suspected.  Ataxia, head trauma. Additional history provided: Patient has fallen multiple times in the past week. EXAM: CT HEAD WITHOUT CONTRAST CT CERVICAL SPINE WITHOUT CONTRAST TECHNIQUE: Multidetector CT imaging of the head and cervical spine was performed following the standard protocol without intravenous contrast. Multiplanar CT image reconstructions of the cervical spine were also generated. COMPARISON:  Head CT 06/15/2019, report from CT cervical spine 07/17/2011 (images unavailable) FINDINGS: CT HEAD FINDINGS Brain: The examination is mildly motion degraded. Redemonstrated small focus of cortical/subcortical encephalomalacia within the right frontal lobe which may reflect a remote infarct. Background mild ill-defined hypoattenuation within the cerebral white matter is nonspecific, but consistent with chronic small vessel ischemic disease. Stable, moderate generalized parenchymal atrophy. There is no acute intracranial hemorrhage. No extra-axial fluid collection. No evidence of intracranial mass. No midline shift. Vascular: No hyperdense vessel.  Atherosclerotic calcifications. Skull: Normal. Negative for fracture or focal lesion. Sinuses/Orbits: Visualized orbits show no acute finding. No significant paranasal sinus disease or mastoid effusion at the imaged levels. CT CERVICAL SPINE FINDINGS Alignment: C3-C4 and C4-C5 grade 1  anterolisthesis. Skull base and vertebrae: The basion-dental and atlanto-dental intervals are maintained.No evidence of acute fracture to the cervical spine. Congenital nonunion of the posterior arch of C1. Soft tissues and spinal canal: No prevertebral fluid or swelling. No visible canal hematoma. Disc levels: Cervical spondylosis. Most notably at C5-C6 there is moderate/advanced disc height loss with posterior disc osteophyte complexes and uncovertebral hypertrophy. Upper chest: Separately reported. There is a small right apical pneumothorax. Partially imaged left pleural effusion. Other: Subcentimeter thyroid nodules not meeting consensus criteria for ultrasound follow-up. Findings of right apical pneumothorax called by telephone at the time of interpretation on 06/18/2019 at 3:30 pm to provider PA [place], who verbally acknowledged these results. IMPRESSION: CT head: 1. Mildly motion degraded examination. 2. No evidence of acute intracranial abnormality. 3. Redemonstrated small focus of encephalomalacia within the right frontal lobe which may reflect a remote infarct. 4. Stable generalized parenchymal atrophy and chronic small vessel ischemic disease. CT cervical spine: 1. No evidence of acute fracture to the cervical spine. 2. C3-C4 and C4-C5 grade 1 anterolisthesis, possibly degenerative. 3. Cervical spondylosis, greatest at C5-C6 and C6-C7. 4. Right apical pneumothorax. Please refer to separately reported chest CT for additional intrathoracic findings. Electronically Signed   By: Kellie Simmering DO   On: 06/18/2019 15:32   CT Hip Right Wo Contrast  Result Date: 06/18/2019 CLINICAL DATA:  Severe right hip pain. Multiple recent falls. Negative radiographs on 06/18/2019 EXAM: CT OF THE RIGHT HIP WITHOUT CONTRAST TECHNIQUE: Multidetector CT imaging of the right hip was performed according to the standard protocol. Multiplanar CT image reconstructions were also generated. COMPARISON:  Radiographs dated 06/18/2019 and  CT scan of the abdomen dated 10/17/2016 FINDINGS: Bones/Joint/Cartilage There is a slightly angulated fracture of the intertrochanteric region of the proximal right femur. Moderate osteoarthritis of the right hip joint. No dislocation. The visualized pelvic bones are intact. Muscles and Tendons Negative. Soft tissues Slight subcutaneous edema and a tiny subcutaneous hematoma lateral to the right greater trochanter. This is consistent with soft tissue contusion. IMPRESSION: 1. Slightly angulated fracture of the intertrochanteric region of the proximal right femur. 2. Moderate osteoarthritis of the right hip joint. 3. Soft tissue contusion lateral to the right greater trochanter. Electronically Signed   By: Lorriane Shire M.D.   On: 06/18/2019 15:24   DG Chest Port 1 View  Result Date: 06/18/2019 CLINICAL DATA:  Shortness of breath and chest pain EXAM: PORTABLE CHEST  1 VIEW COMPARISON:  June 10, 2019 chest radiograph and chest CT Jun 15, 2019 FINDINGS: There are persistent pleural effusions bilaterally, larger on the left than on the right, stable. There is compressive atelectasis in the left lower lobe with questionable superimposed pneumonia, stable. No new opacity evident. Heart is upper normal in size with pacemaker leads attached to right atrium and right ventricle. No adenopathy. Status post internal mammary bypass grafting. There is aortic atherosclerosis. Postoperative changes noted in the proximal left humerus. Bones are osteoporotic. IMPRESSION: Pleural effusions bilaterally, larger on the left than on the right, stable. Probable compressive atelectasis with questionable superimposed pneumonia left base, stable. No new opacity evident. Stable cardiac silhouette. Pacemaker leads attached to right atrium and right ventricle. Aortic Atherosclerosis (ICD10-I70.0).  Bones osteoporotic. Electronically Signed   By: Lowella Grip III M.D.   On: 06/18/2019 11:27   DG HIP UNILAT WITH PELVIS 2-3 VIEWS  RIGHT  Result Date: 06/18/2019 CLINICAL DATA:  Multiple recent falls, moderate to severe RIGHT hip pain. EXAM: DG HIP (WITH OR WITHOUT PELVIS) 2-3V RIGHT COMPARISON:  Plain film of the pelvis and LEFT hip dated 06/15/2019. Plain film of the pelvis dated 08/14/2017. FINDINGS: Osseous alignment is normal. Diffuse osteopenia limits characterization of osseous detail, however, there is no fracture line or displaced fracture fragment identified. Mild degenerative change at each hip. Atherosclerotic vascular calcifications within the bilateral upper thighs. Soft tissues about the pelvis and RIGHT hip are otherwise unremarkable IMPRESSION: No acute findings. No osseous fracture or dislocation. Osteopenia. Electronically Signed   By: Franki Cabot M.D.   On: 06/18/2019 11:30    Review of Systems  Respiratory: Positive for shortness of breath.   All other systems reviewed and are negative.  Blood pressure 130/63, pulse (!) 59, temperature (!) 97.4 F (36.3 C), temperature source Oral, resp. rate 13, height 5\' 2"  (1.575 m), weight 38.6 kg, SpO2 98 %. Physical Exam  Constitutional: She appears well-developed.  HENT:  Head: Atraumatic.  Eyes: EOM are normal.  Cardiovascular: Intact distal pulses.  Respiratory: Effort normal.  Musculoskeletal:     Comments: Right hip tenderness palpation and painful with any range of motion.  Distally grossly neurovascularly intact  Neurological: She is alert.  Skin: Skin is warm and dry.  Psychiatric: She has a normal mood and affect.    Assessment/Plan: Right intertrochanteric hip fracture Recommend surgical fixation to promote early ambulation and prevent complications of bedrest Appreciate hospitalist input and medical management I hope to be able to get her surgery done mid morning tomorrow if she is able to be optimized medically. I spoke with her son at the bedside extensively about surgery and he agrees and wants to move forward with it.  She also agrees and  understands.  Isabella Stalling 06/18/2019, 5:20 PM

## 2019-06-18 NOTE — H&P (Addendum)
History and Physical    Holly Bolton:[id number] DOB: [birth year] DOA: 06/18/2019  PCP: Gayland Curry, DO   I have briefly reviewed patients previous medical reports in [facility].  Patient coming from: Home  Chief Complaint: Right hip pain after a fall at home  HPI: Holly Bolton is a 86 year old female, son lives with her, ambulates with the help of a cane, extensive PMH including atrial flutter/persistent atrial fibrillation on amiodarone but Eliquis stopped during cardiology visit 06/02/2019 due to fall risk, chronic systolic CHF, PAD, CAD s/p CABG, s/p bioprosthetic AVR and ascending aortic aneurysm repair, pacemaker for SSS and second-degree AV block, orthostatic hypotension, recurrent falls, hypothyroidism and stage IV CKD, presented to the [facility] ED on 06/18/2019 due to fall and right hip pain.  She is unable to provide any history due to recently getting IV Dilaudid and hence somnolent and appears somewhat confused.  Patient's son at bedside provided history.  He indicates that this was her fourth fall this month itself.  She was ambulating to the bathroom yesterday when he heard a fall and went and found her laying on the floor on her back, insignificant right hip pain but no bleeding and no history of passing out during this or the 3 prior falls this month.  She has chronic dyspnea which has slowly but progressively gotten worse and is now able to ambulate about 100 feet before becoming dyspneic.  Suspect orthopnea.  No chest pain or palpitations reported.  Easy bruising.  She had been seen in the ED after a fall 3 days prior to this visit.  ED Course: Afebrile, heart rate in the 50s, blood pressures normal, hypoxic requiring oxygen via nasal cannula.  Lab work significant for creatinine 2.01, baseline reportedly 1.6, BNP 941.2, hemoglobin 10.4.  Flu panel PCR and COVID-19 testing negative.  CT head: No evidence of acute intracranial abnormality.  CT cervical spine: No  evidence of acute fracture to the cervical spine.  Right apical pneumothorax.  CT chest: Stable large left effusion and small right effusion, stable cardiomegaly, stable dilatation of aortic arch to a diameter of 4 cm, stable right middle lobe nodule, stable mild compression fracture of T3, marked chronic accentuation of the thoracic kyphosis and aortic atherosclerosis.  CT of right hip: Slight angulated fracture of the intertrochanteric region of the proximal right femur.  Moderate osteoarthritis of the right hip joint.  Soft tissue contusion lateral to the right greater trochanter.  Orthopedics have consulted and plan IM nail on 5/6.  At my request, EDP consulted pulmonology due to pleural effusions, reported pneumothorax, dyspnea on minimal exertion, hypoxia, preop clearance for upcoming procedure.  Review of Systems:  All other systems reviewed and apart from HPI, are negative.  Past Medical History:  Diagnosis Date  . Aneurysm (arteriovenous) of coronary vessels    ascending aorta requiring bypass of the LAD  . Carotid stenosis 04/28/08   Doppler: <40% stenosis bilateral  . Chronic kidney disease, stage 3   . Hyperlipemia   . Hypertension   . Hypertensive chronic kidney disease   . Hypothyroidism, unspecified 02/13/2018   Symptoms well controlled with current therapy  . Mild cognitive impairment, so stated 03/28/2018   Symptoms poorly controlled, needs frequent adjustments in treatment and dose monitoring  . Mild intermittent asthma    Symptoms controlled with difficulty, affecting daily functioning, needs ongoing monitoring  . Occlusion and stenosis of bilateral carotid arteries   . Pacemaker generator end of life  11/18/08   Intermittent high-grade atrioventricular block  . Peripheral arterial disease (HCC)    left ABI of 0.78  . Presence of permanent cardiac pacemaker 08/26/01   Sinus node dysfunction-[place]  . Presence of xenogenic heart valve   . PVD (peripheral vascular disease)  ([place])   . SSS (sick sinus syndrome) ([place])   . Status post ascending aortic aneurysm repair/AVR -  Medtronic Freestyle root 09/28/2014   ascending aorta requiring bypass of the LAD     Past Surgical History:  Procedure Laterality Date  . ABDOMINAL HYSTERECTOMY  1966  . APPENDECTOMY  1943  . ASCENDING AORTIC ANEURYSM REPAIR  09/22/97   porcine aortic root  . BREAST FIBROADENOMA SURGERY  11/89  . CARDIAC SURGERY  09/22/1997   aortic valve root repair with porcine at time of CABG  . CARDIOVERSION N/A 02/10/2019   Procedure: CARDIOVERSION;  Surgeon: Skeet Latch, MD;  Location: [place];  Service: Cardiovascular;  Laterality: N/A;  . CORONARY ARTERY BYPASS GRAFT  09/22/97   LIMA to the LAD  . I & D EXTREMITY Left 08/15/2017   Procedure: IRRIGATION AND DEBRIDEMENT EXTREMITY;  Surgeon: Nicholes Stairs, MD;  Location: [place];  Service: Orthopedics;  Laterality: Left;  . [place]   left  . NEPHRECTOMY  1958   right  . ORIF HUMERUS FRACTURE Left 08/15/2017   Procedure: OPEN REDUCTION INTERNAL FIXATION (ORIF) PROXIMAL HUMERUS FRACTURE;  Surgeon: Nicholes Stairs, MD;  Location: [place];  Service: Orthopedics;  Laterality: Left;  . OVARIAN CYST SURGERY  1948  . PACEMAKER GENERATOR CHANGE  11/18/08   [place]  . PACEMAKER INSERTION  08/26/01   [place]  . PPM GENERATOR CHANGEOUT N/A 05/16/2017   Procedure: PPM GENERATOR CHANGEOUT;  Surgeon: Sanda Klein, MD;  Location: [place] [facility];  Service: Cardiovascular;  Laterality: N/A;    Social History  reports that she has never smoked. She has never used smokeless tobacco. She reports that she does not drink alcohol or use drugs.  Allergies  Allergen Reactions  . Accupril [Quinapril Hcl] Other (See Comments)    Unknown reaction   . Amlodipine Swelling  . Benadryl [Diphenhydramine Hcl] Other (See Comments)    Unknown reaction   . Biaxin [Clarithromycin] Other (See Comments)    Unknown reaction   .  Ciprofloxacin Nausea And Vomiting  . Codeine Other (See Comments)    Unknown reaction   . Diovan [Valsartan] Other (See Comments)    Unknown reaction   . Medrol [Methylprednisolone] Other (See Comments)    Unknown  . Morphine And Related Other (See Comments)    Unknown reaction   . Neurontin [Gabapentin] Other (See Comments)    Unknown reaction   . Penicillins     Unknown reaction  Did it involve swelling of the face/tongue/throat, SOB, or low BP? Unknown Did it involve sudden or severe rash/hives, skin peeling, or any reaction on the inside of your mouth or nose? Unknown Did you need to seek medical attention at a hospital or doctor's office? Unknown When did it last happen?unknown If all above answers are "NO", may proceed with cephalosporin use.      Family History  Problem Relation Age of Onset  . Heart attack Mother   . Heart attack Father   . Heart attack Brother   . Hyperlipidemia Sister   . Hypertension Sister      Prior to Admission medications   Medication Sig Start Date End Date Taking? Authorizing Provider  amiodarone (PACERONE) 200 MG tablet TAKE ONE TABLET BY MOUTH TWICE A DAY FOR 1 WEEK THEN REDUCE TO 1 TABLET DAILY THEREAFTER Patient taking differently: Take 200 mg by mouth daily. TAKE ONE TABLET BY MOUTH TWICE A DAY FOR 1 WEEK THEN REDUCE TO 1 TABLET DAILY THEREAFTER 06/12/19  Yes Croitoru, Mihai, MD  aspirin 325 MG tablet Take 325 mg by mouth every 4 (four) hours as needed for mild pain.   Yes [provider]  aspirin 81 MG chewable tablet Chew 81 mg by mouth daily.   Yes [provider]  brimonidine (ALPHAGAN) 0.2 % ophthalmic solution Place 1 drop into both eyes 2 (two) times daily.  03/31/16  Yes [provider]  carvedilol (COREG) 3.125 MG tablet TAKE ONE TABLET BY MOUTH TWICE A DAY WITH FOOD Patient taking differently: Take 3.125 mg by mouth in the morning and at bedtime.  03/24/19  Yes Reed, Tiffany L, DO  Cholecalciferol  (VITAMIN D) 50 MCG (2000 UT) CAPS Take 2,000 Units by mouth at bedtime.    Yes [provider]  Coenzyme Q10 (COQ10) 200 MG CAPS Take 200 mg by mouth daily.   Yes [provider]  diphenhydramine-acetaminophen (TYLENOL PM) 25-500 MG TABS tablet Take 0.5 tablets by mouth at bedtime as needed (sleep).   Yes [provider]  furosemide (LASIX) 20 MG tablet Take 1 tablet (20 mg total) by mouth daily. 03/25/19  Yes Reed, Tiffany L, DO  lidocaine (LIDODERM) 5 % Place 1 patch onto the skin daily as needed (pain). Remove & Discard patch within 12 hours or as directed by MD   Yes [provider]  lidocaine (LMX) 4 % cream Apply 1 application topically daily as needed (pain).   Yes [provider]  vitamin B-12 (CYANOCOBALAMIN) 500 MCG tablet Take 500 mcg by mouth daily.   Yes [provider]    Physical Exam: Vitals:   06/18/19 1600 06/18/19 1615 06/18/19 1630 06/18/19 1645  BP: (!) 137/50 (!) 126/54 (!) 151/63 130/63  Pulse: 72 62 (!) 59 (!) 59  Resp: 18 20 13 13   Temp:      TempSrc:      SpO2: 96% 100% 99% 98%  Weight:      Height:        Patient was examined along with her female RN in room as chaperone and son at bedside.  Constitutional: Elderly female, small built, frail, chronically ill looking, lying comfortably propped up in bed without distress. Eyes: PERTLA, lids and conjunctivae normal.  Left periorbital ecchymosis from a fall 2 weeks ago.  Bilateral IOLs. ENMT: Mucous membranes are moist. Posterior pharynx clear of any exudate or lesions. Normal dentition.  Neck: supple, no masses, no thyromegaly Respiratory: Reduced breath sounds left > right.  No wheezing or rhonchi.  No increased work of breathing. Cardiovascular: S1 & S2 heard, regular rate and rhythm. No JVD, rubs or clicks. No pedal edema.  Pansystolic murmur best heard at apex. Abdomen: Non distended. Non tender. Soft. No organomegaly or masses appreciated. No clinical  Ascites. Normal bowel sounds heard. Musculoskeletal: No cyanosis or clubbing.  Painful range of movements and restricted movements of right lower extremity due to right hip pain.  Other limbs at least grade 4 x 5 power. Skin: Left shin superficial wound which appears clean.  Dressing intact.  Other areas of mild bruising Neurologic: CN 2-12 grossly intact. Sensation intact, DTR normal. .  Psychiatric: Impaired judgment and insight.  Slightly drowsy but arousable,  oriented only to self, follows simple instructions.    Labs on Admission: I have personally reviewed following labs and imaging studies  CBC: Recent Labs  Lab 06/18/19 1048  WBC 7.4  NEUTROABS 5.4  HGB 10.4*  HCT 33.1*  MCV 105.1*  PLT 563    Basic Metabolic Panel: Recent Labs  Lab 06/18/19 1048  NA 141  K 4.4  CL 104  CO2 27  GLUCOSE 129*  BUN 45*  CREATININE 2.01*  CALCIUM 8.7*    Liver Function Tests: Recent Labs  Lab 06/18/19 1048  AST 23  ALT 20  ALKPHOS 102  BILITOT 0.9  PROT 6.5  ALBUMIN 3.3*     Radiological Exams on Admission: CT Head Wo Contrast  Result Date: 06/18/2019 CLINICAL DATA:  Poly trauma, critical, head/cervical spine injury suspected. Ataxia, head trauma. Additional history provided: Patient has fallen multiple times in the past week. EXAM: CT HEAD WITHOUT CONTRAST CT CERVICAL SPINE WITHOUT CONTRAST TECHNIQUE: Multidetector CT imaging of the head and cervical spine was performed following the standard protocol without intravenous contrast. Multiplanar CT image reconstructions of the cervical spine were also generated. COMPARISON:  Head CT 06/15/2019, report from CT cervical spine 07/17/2011 (images unavailable) FINDINGS: CT HEAD FINDINGS Brain: The examination is mildly motion degraded. Redemonstrated small focus of cortical/subcortical encephalomalacia within the right frontal lobe which may reflect a remote infarct. Background mild ill-defined hypoattenuation within the cerebral white  matter is nonspecific, but consistent with chronic small vessel ischemic disease. Stable, moderate generalized parenchymal atrophy. There is no acute intracranial hemorrhage. No extra-axial fluid collection. No evidence of intracranial mass. No midline shift. Vascular: No hyperdense vessel.  Atherosclerotic calcifications. Skull: Normal. Negative for fracture or focal lesion. Sinuses/Orbits: Visualized orbits show no acute finding. No significant paranasal sinus disease or mastoid effusion at the imaged levels. CT CERVICAL SPINE FINDINGS Alignment: C3-C4 and C4-C5 grade 1 anterolisthesis. Skull base and vertebrae: The basion-dental and atlanto-dental intervals are maintained.No evidence of acute fracture to the cervical spine. Congenital nonunion of the posterior arch of C1. Soft tissues and spinal canal: No prevertebral fluid or swelling. No visible canal hematoma. Disc levels: Cervical spondylosis. Most notably at C5-C6 there is moderate/advanced disc height loss with posterior disc osteophyte complexes and uncovertebral hypertrophy. Upper chest: Separately reported. There is a small right apical pneumothorax. Partially imaged left pleural effusion. Other: Subcentimeter thyroid nodules not meeting consensus criteria for ultrasound follow-up. Findings of right apical pneumothorax called by telephone at the time of interpretation on 06/18/2019 at 3:30 pm to provider PA [place], who verbally acknowledged these results. IMPRESSION: CT head: 1. Mildly motion degraded examination. 2. No evidence of acute intracranial abnormality. 3. Redemonstrated small focus of encephalomalacia within the right frontal lobe which may reflect a remote infarct. 4. Stable generalized parenchymal atrophy and chronic small vessel ischemic disease. CT cervical spine: 1. No evidence of acute fracture to the cervical spine. 2. C3-C4 and C4-C5 grade 1 anterolisthesis, possibly degenerative. 3. Cervical spondylosis, greatest at C5-C6 and C6-C7. 4.  Right apical pneumothorax. Please refer to separately reported chest CT for additional intrathoracic findings. Electronically Signed   By: Kellie Simmering DO   On: 06/18/2019 15:32   CT Chest Wo Contrast  Result Date: 06/18/2019 CLINICAL DATA:  Chest pain and shortness of breath. Bilateral pleural effusions. EXAM: CT CHEST WITHOUT CONTRAST TECHNIQUE: Multidetector CT imaging of the chest was performed following the standard protocol without IV contrast. COMPARISON:  Chest x-rays dated 06/18/2019 and 06/10/2019 and chest CT dated 06/15/2019  FINDINGS: Cardiovascular: Chronic cardiomegaly. Previous aortic root repair. Unchanged dilatation of the aortic arch to a diameter of 4 cm, stable. No pericardial effusion. Pacemaker in place. CABG. Mediastinum/Nodes: Multinodular goiter, unchanged. Trachea is normal. Esophagus appears normal. No appreciable hilar or mediastinal or axillary adenopathy. Lungs/Pleura: Persistent large left effusion and small right effusion, essentially unchanged. Minimal compressive atelectasis at the lung bases, stable. No acute infiltrates. Stable nodule in the right middle lobe, 5 x 7 mm. Upper Abdomen: No acute abnormality. Musculoskeletal: Stable mild compression fracture of T3, unchanged. Marked chronic accentuation of the thoracic kyphosis. No acute bone abnormality. IMPRESSION: 1. Stable large left effusion and small right effusion. 2. Stable cardiomegaly. 3. Stable dilatation of the aortic arch to a diameter of 4 cm. 4. Stable right middle lobe nodule. 5. Stable mild compression fracture of T3. 6. Marked chronic accentuation of the thoracic kyphosis. 7. Aortic atherosclerosis. Aortic Atherosclerosis (ICD10-I70.0). Electronically Signed   By: Lorriane Shire M.D.   On: 06/18/2019 15:32   CT Cervical Spine Wo Contrast  Result Date: 06/18/2019 CLINICAL DATA:  Poly trauma, critical, head/cervical spine injury suspected. Ataxia, head trauma. Additional history provided: Patient has fallen  multiple times in the past week. EXAM: CT HEAD WITHOUT CONTRAST CT CERVICAL SPINE WITHOUT CONTRAST TECHNIQUE: Multidetector CT imaging of the head and cervical spine was performed following the standard protocol without intravenous contrast. Multiplanar CT image reconstructions of the cervical spine were also generated. COMPARISON:  Head CT 06/15/2019, report from CT cervical spine 07/17/2011 (images unavailable) FINDINGS: CT HEAD FINDINGS Brain: The examination is mildly motion degraded. Redemonstrated small focus of cortical/subcortical encephalomalacia within the right frontal lobe which may reflect a remote infarct. Background mild ill-defined hypoattenuation within the cerebral white matter is nonspecific, but consistent with chronic small vessel ischemic disease. Stable, moderate generalized parenchymal atrophy. There is no acute intracranial hemorrhage. No extra-axial fluid collection. No evidence of intracranial mass. No midline shift. Vascular: No hyperdense vessel.  Atherosclerotic calcifications. Skull: Normal. Negative for fracture or focal lesion. Sinuses/Orbits: Visualized orbits show no acute finding. No significant paranasal sinus disease or mastoid effusion at the imaged levels. CT CERVICAL SPINE FINDINGS Alignment: C3-C4 and C4-C5 grade 1 anterolisthesis. Skull base and vertebrae: The basion-dental and atlanto-dental intervals are maintained.No evidence of acute fracture to the cervical spine. Congenital nonunion of the posterior arch of C1. Soft tissues and spinal canal: No prevertebral fluid or swelling. No visible canal hematoma. Disc levels: Cervical spondylosis. Most notably at C5-C6 there is moderate/advanced disc height loss with posterior disc osteophyte complexes and uncovertebral hypertrophy. Upper chest: Separately reported. There is a small right apical pneumothorax. Partially imaged left pleural effusion. Other: Subcentimeter thyroid nodules not meeting consensus criteria for  ultrasound follow-up. Findings of right apical pneumothorax called by telephone at the time of interpretation on 06/18/2019 at 3:30 pm to provider PA [place], who verbally acknowledged these results. IMPRESSION: CT head: 1. Mildly motion degraded examination. 2. No evidence of acute intracranial abnormality. 3. Redemonstrated small focus of encephalomalacia within the right frontal lobe which may reflect a remote infarct. 4. Stable generalized parenchymal atrophy and chronic small vessel ischemic disease. CT cervical spine: 1. No evidence of acute fracture to the cervical spine. 2. C3-C4 and C4-C5 grade 1 anterolisthesis, possibly degenerative. 3. Cervical spondylosis, greatest at C5-C6 and C6-C7. 4. Right apical pneumothorax. Please refer to separately reported chest CT for additional intrathoracic findings. Electronically Signed   By: Kellie Simmering DO   On: 06/18/2019 15:32   CT Hip Right  Wo Contrast  Result Date: 06/18/2019 CLINICAL DATA:  Severe right hip pain. Multiple recent falls. Negative radiographs on 06/18/2019 EXAM: CT OF THE RIGHT HIP WITHOUT CONTRAST TECHNIQUE: Multidetector CT imaging of the right hip was performed according to the standard protocol. Multiplanar CT image reconstructions were also generated. COMPARISON:  Radiographs dated 06/18/2019 and CT scan of the abdomen dated 10/17/2016 FINDINGS: Bones/Joint/Cartilage There is a slightly angulated fracture of the intertrochanteric region of the proximal right femur. Moderate osteoarthritis of the right hip joint. No dislocation. The visualized pelvic bones are intact. Muscles and Tendons Negative. Soft tissues Slight subcutaneous edema and a tiny subcutaneous hematoma lateral to the right greater trochanter. This is consistent with soft tissue contusion. IMPRESSION: 1. Slightly angulated fracture of the intertrochanteric region of the proximal right femur. 2. Moderate osteoarthritis of the right hip joint. 3. Soft tissue contusion lateral to the  right greater trochanter. Electronically Signed   By: Lorriane Shire M.D.   On: 06/18/2019 15:24   DG Chest Port 1 View  Result Date: 06/18/2019 CLINICAL DATA:  Shortness of breath and chest pain EXAM: PORTABLE CHEST 1 VIEW COMPARISON:  June 10, 2019 chest radiograph and chest CT Jun 15, 2019 FINDINGS: There are persistent pleural effusions bilaterally, larger on the left than on the right, stable. There is compressive atelectasis in the left lower lobe with questionable superimposed pneumonia, stable. No new opacity evident. Heart is upper normal in size with pacemaker leads attached to right atrium and right ventricle. No adenopathy. Status post internal mammary bypass grafting. There is aortic atherosclerosis. Postoperative changes noted in the proximal left humerus. Bones are osteoporotic. IMPRESSION: Pleural effusions bilaterally, larger on the left than on the right, stable. Probable compressive atelectasis with questionable superimposed pneumonia left base, stable. No new opacity evident. Stable cardiac silhouette. Pacemaker leads attached to right atrium and right ventricle. Aortic Atherosclerosis (ICD10-I70.0).  Bones osteoporotic. Electronically Signed   By: Lowella Grip III M.D.   On: 06/18/2019 11:27   DG HIP UNILAT WITH PELVIS 2-3 VIEWS RIGHT  Result Date: 06/18/2019 CLINICAL DATA:  Multiple recent falls, moderate to severe RIGHT hip pain. EXAM: DG HIP (WITH OR WITHOUT PELVIS) 2-3V RIGHT COMPARISON:  Plain film of the pelvis and LEFT hip dated 06/15/2019. Plain film of the pelvis dated 08/14/2017. FINDINGS: Osseous alignment is normal. Diffuse osteopenia limits characterization of osseous detail, however, there is no fracture line or displaced fracture fragment identified. Mild degenerative change at each hip. Atherosclerotic vascular calcifications within the bilateral upper thighs. Soft tissues about the pelvis and RIGHT hip are otherwise unremarkable IMPRESSION: No acute findings. No  osseous fracture or dislocation. Osteopenia. Electronically Signed   By: Franki Cabot M.D.   On: 06/18/2019 11:30    EKG: Independently reviewed.  Poor EKG with baseline artifact but seems to be paced rhythm, BBB morphology.  Assessment/Plan Principal Problem:   Closed intertrochanteric fracture of right hip (HCC) Active Problems:   CAD s/p CABG (LIMA to LAD)   History of aortic root repairand bioprosthetic AVR   Hyperlipidemia   Pacemaker, dual chamber [employer] 2003/2010/2019   Essential hypertension   Persistent atrial fibrillation (HCC)   Chronic systolic CHF (congestive heart failure) (HCC)   Acute kidney injury superimposed on CKD ([place])   Adult failure to thrive     Closed right intertrochanteric hip fracture: Sustained post mechanical fall at home.  Orthopedics consultation appreciated.  I discussed with Dr. Tamera Punt at bedside.  Plan for surgical fixation with IM nail on  5/6.  He hopes for surgery under spinal anesthesia but ultimate decision deferred to anesthesiologist.  Patient is high risk for procedure given advanced age, frail physical health, multiple severe significant comorbidities.  This has been clearly outlined to son at bedside who verbalized understanding.  Despite the risks, she will probably be better off getting the surgery to minimize pain and improve her quality of life otherwise she is going to be bedbound with significant pain and associated complications.  Pulmonology consultation for preop clearance appreciated-tiny right pneumothorax likely posttraumatic, no chest tube for now.  Chronic left greater than right pleural effusion, favor monitoring for now and they will follow-up chest x-ray in a.m. to make sure they are not worsening.  Continue perioperative recommendations as per pulmonology including aggressive pulmonary toilet, DVT prophylaxis and mobilization postop when appropriate and avoid volume overload.  Cardiology also consulted for preop clearance, have  discussed with Dr. Irish Lack and will await recommendations.  Tiny right pneumothorax: PCCM input appreciated, likely posttraumatic.  No current indication for chest tube.  Will follow chest x-ray in a.m., if enlarged may require chest tube placement.  Chronic left greater than right pleural effusions: PCCM input appreciated, likely due to chronic heart failure.  These have been present for months or years.  At this time they recommend monitoring but will follow-up chest x-ray in a.m. to ensure that they are not significantly enlarging overnight which may raise the suspicion of hemorrhagic effusion.  Acute on stage IV chronic kidney disease: Creatinine has gone up from 1.6 mid-March to 2.01.  May be related to poor oral intake.  Did not see diuretics on her home med list.  Received a small bolus of IV fluids but IV fluids will not be continued due to high risk for volume overload.  Follow BMP in a.m.  Avoid nephrotoxic's.  Acute respiratory failure with hypoxia: Secondary to pleural effusions, chronic CHF and sedation from opioids.  Avoid oversedation.  Oxygen supplements.  Incentive spirometer.  Anemia of chronic disease/macrocytic anemia: Stable.  Follow CBC.  Atrial flutter/persistent A. fib: Continue amiodarone.  Eliquis discontinued by cardiologist mid-April due to fall risk outweighing benefits of stroke prevention.  Chronic systolic CHF: Does not appear peripherally volume overloaded except for her chronic pleural effusions.  Not on diuretics PTA.  Avoid aggressive IV fluids.  CAD/PAD and other cardiac issues as listed above: No anginal symptoms.  Appears stable  Recurrent falls  Adult failure to thrive: Multifactorial due to very advanced age, severe malnutrition, severe significant irreversible comorbidities.  Discussed in detail with son regarding consideration for changing CODE STATUS but he states that patient was "resistant to DNR".  No designated healthcare power of attorney but he  makes decisions for her.  Advised him to continue discussing CODE STATUS with patient due to extremely high risk of poor outcome if resuscitated.  Palliative care consulted for goals of care.    DVT prophylaxis: SCDs Code Status: Full.  Please see discussion above Family Communication: Discussed in detail with patient's son at bedside, updated care and answered questions. Disposition Plan:   Patient is from:  Home  Anticipated DC to:  To be determined postoperatively  Anticipated DC date:  Not known at this time.  Anticipated DC barriers: Recovery from right hip fracture and upcoming surgery and multiple other medical problems.   Consults called: Pulmonology, cardiology, orthopedics Admission status: Inpatient, telemetry  Severity of Illness: The appropriate patient status for this patient is INPATIENT. Inpatient status is judged to be reasonable  and necessary in order to provide the required intensity of service to ensure the patient's safety. The patient's presenting symptoms, physical exam findings, and initial radiographic and laboratory data in the context of their chronic comorbidities is felt to place them at high risk for further clinical deterioration. Furthermore, it is not anticipated that the patient will be medically stable for discharge from the hospital within 2 midnights of admission. The following factors support the patient status of inpatient.   " The patient's presenting symptoms include right hip pain, chronic dyspnea that is been progressively worsening. " The worrisome physical exam findings include diminished breath sounds bilaterally, hypoxic, right hip restricted movement secondary to pain. " The initial radiographic and laboratory data are worrisome because of bilateral pleural effusion, left greater than right, tiny pneumothorax, creatinine up to greater than 2, stable anemia. " The chronic co-morbidities include CAD, PAD, a flutter/A. fib, pacemaker, chronic  systolic CHF.   * I certify that at the point of admission it is my clinical judgment that the patient will require inpatient hospital care spanning beyond 2 midnights from the point of admission due to high intensity of service, high risk for further deterioration and high frequency of surveillance required.Vernell Leep MD [facility]  To contact the attending provider between 7A-7P or the covering provider during after hours 7P-7A, please log into the web site [email or web address] and access using universal [place] password for that web site. If you do not have the password, please call the hospital operator.  06/18/2019, 5:45 PM

## 2019-06-18 NOTE — ED Provider Notes (Addendum)
Patient signed out to me awaiting CT scans.  Patient with mild hypoxia.  On 2 L of oxygen.  Had what appears to be mechanical fall at home.  Multiple falls recently.  Has pain mostly to the right hip.  X-rays thus far unremarkable.  However CT scan of the head, neck, chest, right hip as concern for hip fracture.  Patient with fluid on chest x-ray.  History of heart failure.  Lab work overall unremarkable except for mild elevation in kidney function.  BNP mildly elevated.  CT imaging shows small right apical pneumothorax.  Also showed right hip fracture.  Otherwise no acute injuries or processes.  Has stable effusions.  Dr. Tamera Punt with orthopedics will evaluate the patient but surgery likely tomorrow.  Patient stable on 2 L of oxygen.  Will admit to hospitalist for further care.  Patient is still full code.  Per hospitalist request, Ernest Mallick with pulmonary critical care was consulted for possible management of pneumothorax, pleural effusion if needed.  This chart was dictated using voice recognition software.  Despite best efforts to proofread,  errors can occur which can change the documentation meaning.    Lennice Sites, DO 06/18/19 [place], [place], DO 06/18/19 1701

## 2019-06-18 NOTE — ED Notes (Signed)
Pt's Sat dropped to 85% RA. C/O mild SHOB. Denies chest pain. 2LNC applied with little effect. 4LNC applied with + effect. Cont to monitor. Pt is A/Ox3. Skin w/d/pink. Resp wnl, equal and non-labored. IV access obt and labs sent. Warm blankets applied. Pt is aware not to get out of bed on her own. Call bell within reach. Son arrives at bedside.

## 2019-06-18 NOTE — ED Triage Notes (Signed)
Per EMS, patient from home, reports multiple falls over the last week. C/o right hip and right elbow pain after fall today. 88% on RA. 95% on 3L [place]. Hx CHF.

## 2019-06-18 NOTE — ED Provider Notes (Signed)
[facility] DEPT Provider Note   CSN: [id number] Arrival date & time: 06/18/19  1607     History Chief Complaint  Patient presents with  . Fall    Holly Bolton is a 86 y.o. female.  Patient has history of multiple falls recently.  She has a history of congestive heart failure hypertension and bypass surgery.  Patient fell today complains of right hip pain  The history is provided by the patient. No language interpreter was used.  Fall This is a recurrent problem. The current episode started 1 to 2 hours ago. The problem occurs constantly. The problem has been resolved. Pertinent negatives include no chest pain, no abdominal pain and no headaches. Exacerbated by: Movement of hip. Nothing relieves the symptoms. She has tried nothing for the symptoms. The treatment provided no relief.       Past Medical History:  Diagnosis Date  . Aneurysm (arteriovenous) of coronary vessels    ascending aorta requiring bypass of the LAD  . Carotid stenosis 04/28/08   Doppler: <40% stenosis bilateral  . Chronic kidney disease, stage 3   . Hyperlipemia   . Hypertension   . Hypertensive chronic kidney disease   . Hypothyroidism, unspecified 02/13/2018   Symptoms well controlled with current therapy  . Mild cognitive impairment, so stated 03/28/2018   Symptoms poorly controlled, needs frequent adjustments in treatment and dose monitoring  . Mild intermittent asthma    Symptoms controlled with difficulty, affecting daily functioning, needs ongoing monitoring  . Occlusion and stenosis of bilateral carotid arteries   . Pacemaker generator end of life 11/18/08   Intermittent high-grade atrioventricular block  . Peripheral arterial disease (HCC)    left ABI of 0.78  . Presence of permanent cardiac pacemaker 08/26/01   Sinus node dysfunction-[employer]  . Presence of xenogenic heart valve   . PVD (peripheral vascular disease) ([place])   . SSS (sick sinus syndrome) ([place])   .  Status post ascending aortic aneurysm repair/AVR -  Medtronic Freestyle root 09/28/2014   ascending aorta requiring bypass of the LAD     Patient Active Problem List   Diagnosis Date Noted  . CHB (complete heart block) ([place])   . Secondary hypercoagulable state ([place]) 04/24/2019  . Atypical atrial flutter ([place])   . Acute on chronic congestive heart failure ([place]) 01/22/2019  . Chronic kidney disease, stage 3   . Persistent atrial fibrillation ([place])   . Macrocytic anemia   . Acute on chronic diastolic CHF (congestive heart failure) ([place]) 10/11/2018  . Chronic kidney disease, stage 4 (severe) ([place]) 09/26/2018  . Intermittent claudication ([place]) 06/27/2018  . Mild intermittent asthma without complication 37/11/6267  . Senile osteoporosis 06/27/2018  . Bilateral bunions 03/28/2018  . Hyperthyroidism 03/20/2018  . Weight loss, unintentional 02/11/2018  . H/O hyperthyroidism 02/11/2018  . Memory loss, short term 02/11/2018  . Frailty syndrome in geriatric patient 02/11/2018  . Urge incontinence of urine 02/11/2018  . S/p nephrectomy 02/11/2018  . B12 deficiency 02/11/2018  . Elevated troponin   . Asthma 08/15/2017  . Open left humeral fracture 08/15/2017  . Fracture 08/15/2017  . Pacemaker battery depletion 05/17/2017  . Leg cramps 06/12/2016  . Peripheral arterial disease ([place]) 12/08/2014  . Status post ascending aortic aneurysm repair/AVR -  Medtronic Freestyle root 09/28/2014  . CAD s/p CABG (LIMA to LAD) 07/08/2013  . History of aortic root repairand bioprosthetic AVR 07/08/2013  . Hyperlipidemia 07/08/2013  . SSS (sick sinus syndrome) ([place]) 07/08/2013  .  Second degree AV block 07/08/2013  . Pacemaker, dual chamber St. Jude 2003/2010/2019 07/08/2013  . Essential hypertension 07/08/2013    Past Surgical History:  Procedure Laterality Date  . ABDOMINAL HYSTERECTOMY  1966  . APPENDECTOMY  1943  . ASCENDING AORTIC ANEURYSM REPAIR  09/22/97   porcine aortic root  . BREAST  FIBROADENOMA SURGERY  11/89  . CARDIAC SURGERY  09/22/1997   aortic valve root repair with porcine at time of CABG  . CARDIOVERSION N/A 02/10/2019   Procedure: CARDIOVERSION;  Surgeon: Skeet Latch, MD;  Location: [facility];  Service: Cardiovascular;  Laterality: N/A;  . CORONARY ARTERY BYPASS GRAFT  09/22/97   LIMA to the LAD  . I & D EXTREMITY Left 08/15/2017   Procedure: IRRIGATION AND DEBRIDEMENT EXTREMITY;  Surgeon: Nicholes Stairs, MD;  Location: [facility];  Service: Orthopedics;  Laterality: Left;  . [place]   left  . NEPHRECTOMY  1958   right  . ORIF HUMERUS FRACTURE Left 08/15/2017   Procedure: OPEN REDUCTION INTERNAL FIXATION (ORIF) PROXIMAL HUMERUS FRACTURE;  Surgeon: Nicholes Stairs, MD;  Location: [facility];  Service: Orthopedics;  Laterality: Left;  . OVARIAN CYST SURGERY  1948  . PACEMAKER GENERATOR CHANGE  11/18/08   [facility]  . PACEMAKER INSERTION  08/26/01   [employer]  . PPM GENERATOR CHANGEOUT N/A 05/16/2017   Procedure: PPM GENERATOR CHANGEOUT;  Surgeon: Sanda Klein, MD;  Location: [facility];  Service: Cardiovascular;  Laterality: N/A;     OB History   No obstetric history on file.     Family History  Problem Relation Age of Onset  . Heart attack Mother   . Heart attack Father   . Heart attack Brother   . Hyperlipidemia Sister   . Hypertension Sister     Social History   Tobacco Use  . Smoking status: Never Smoker  . Smokeless tobacco: Never Used  Substance Use Topics  . Alcohol use: No  . Drug use: No    Home Medications Prior to Admission medications   Medication Sig Start Date End Date Taking? Authorizing Provider  amiodarone (PACERONE) 200 MG tablet TAKE ONE TABLET BY MOUTH TWICE A DAY FOR 1 WEEK THEN REDUCE TO 1 TABLET DAILY THEREAFTER Patient taking differently: Take 200 mg by mouth daily. TAKE ONE TABLET BY MOUTH TWICE A DAY FOR 1 WEEK THEN REDUCE TO 1 TABLET DAILY THEREAFTER 06/12/19  Yes Croitoru, Mihai,  MD  aspirin 325 MG tablet Take 325 mg by mouth every 4 (four) hours as needed for mild pain.   Yes [provider]  aspirin 81 MG chewable tablet Chew 81 mg by mouth daily.   Yes [provider]  brimonidine (ALPHAGAN) 0.2 % ophthalmic solution Place 1 drop into both eyes 2 (two) times daily.  03/31/16  Yes [provider]  carvedilol (COREG) 3.125 MG tablet TAKE ONE TABLET BY MOUTH TWICE A DAY WITH FOOD Patient taking differently: Take 3.125 mg by mouth in the morning and at bedtime.  03/24/19  Yes Reed, Tiffany L, DO  Cholecalciferol (VITAMIN D) 50 MCG (2000 UT) CAPS Take 2,000 Units by mouth at bedtime.    Yes [provider]  Coenzyme Q10 (COQ10) 200 MG CAPS Take 200 mg by mouth daily.   Yes [provider]  diphenhydramine-acetaminophen (TYLENOL PM) 25-500 MG TABS tablet Take 0.5 tablets by mouth at bedtime as needed (sleep).   Yes [provider]  furosemide (LASIX) 20 MG tablet Take  1 tablet (20 mg total) by mouth daily. 03/25/19  Yes Reed, Tiffany L, DO  lidocaine (LIDODERM) 5 % Place 1 patch onto the skin daily as needed (pain). Remove & Discard patch within 12 hours or as directed by MD   Yes [provider]  lidocaine (LMX) 4 % cream Apply 1 application topically daily as needed (pain).   Yes [provider]  vitamin B-12 (CYANOCOBALAMIN) 500 MCG tablet Take 500 mcg by mouth daily.   Yes [provider]  albuterol (ACCUNEB) 0.63 MG/3ML nebulizer solution Take 3 mLs (0.63 mg total) by nebulization every 6 (six) hours as needed for wheezing. Patient not taking: Reported on 06/18/2019 02/17/19   Hollace Kinnier L, DO    Allergies    Accupril [quinapril hcl], Amlodipine, Benadryl [diphenhydramine hcl], Biaxin [clarithromycin], Ciprofloxacin, Codeine, Diovan [valsartan], Medrol [methylprednisolone], Morphine and related, Neurontin [gabapentin], and Penicillins  Review of Systems   Review of Systems  Constitutional:  Negative for appetite change and fatigue.  HENT: Negative for congestion, ear discharge and sinus pressure.   Eyes: Negative for discharge.  Respiratory: Negative for cough.   Cardiovascular: Negative for chest pain.  Gastrointestinal: Negative for abdominal pain and diarrhea.  Genitourinary: Negative for frequency and hematuria.  Musculoskeletal: Negative for back pain.       Right hip pain  Skin: Negative for rash.  Neurological: Negative for seizures and headaches.  Psychiatric/Behavioral: Negative for hallucinations.    Physical Exam Updated Vital Signs BP (!) 146/51   Pulse (!) 59   Temp (!) 97.4 F (36.3 C) (Oral)   Resp 20   Ht 5\' 2"  (1.575 m)   Wt 38.6 kg   SpO2 90%   BMI 15.55 kg/m   Physical Exam Vitals and nursing note reviewed.  Constitutional:      Appearance: She is well-developed.  HENT:     Head: Normocephalic.     Comments: Bruising around the left eye    Nose: Nose normal.  Eyes:     General: No scleral icterus.    Conjunctiva/sclera: Conjunctivae normal.  Neck:     Thyroid: No thyromegaly.  Cardiovascular:     Rate and Rhythm: Normal rate and regular rhythm.     Heart sounds: No murmur. No friction rub. No gallop.   Pulmonary:     Breath sounds: No stridor. No wheezing or rales.     Comments: Decreased breath sounds Chest:     Chest wall: No tenderness.  Abdominal:     General: There is no distension.     Tenderness: There is no abdominal tenderness. There is no rebound.  Musculoskeletal:        General: Normal range of motion.     Cervical back: Neck supple.     Comments: Tender right hip  Lymphadenopathy:     Cervical: No cervical adenopathy.  Skin:    Findings: No erythema or rash.  Neurological:     Mental Status: She is alert and oriented to person, place, and time.     Motor: No abnormal muscle tone.     Coordination: Coordination normal.  Psychiatric:        Behavior: Behavior normal.     ED Results / Procedures /  Treatments   Labs (all labs ordered are listed, but only abnormal results are displayed) Labs Reviewed  CBC WITH DIFFERENTIAL/PLATELET - Abnormal; Notable for the following components:      Result Value   RBC 3.15 (*)    Hemoglobin 10.4 (*)  HCT 33.1 (*)    MCV 105.1 (*)    RDW 17.2 (*)    nRBC 0.4 (*)    Lymphs Abs 0.5 (*)    Monocytes Absolute 1.2 (*)    All other components within normal limits  COMPREHENSIVE METABOLIC PANEL - Abnormal; Notable for the following components:   Glucose, Bld 129 (*)    BUN 45 (*)    Creatinine, Ser 2.01 (*)    Calcium 8.7 (*)    Albumin 3.3 (*)    GFR calc non Af Amer 21 (*)    GFR calc Af Amer 24 (*)    All other components within normal limits  BRAIN NATRIURETIC PEPTIDE - Abnormal; Notable for the following components:   B Natriuretic Peptide 941.2 (*)    All other components within normal limits    EKG EKG Interpretation  Date/Time:  Wednesday Jun 18 2019 13:57:03 EDT Ventricular Rate:  60 PR Interval:    QRS Duration: 181 QT Interval:  558 QTC Calculation: 558 R Axis:   -72 Text Interpretation: Sinus rhythm Short PR interval Left bundle branch block Confirmed by Milton Ferguson [phone]) on 06/18/2019 2:01:41 PM   Radiology DG Chest Port 1 View  Result Date: 06/18/2019 CLINICAL DATA:  Shortness of breath and chest pain EXAM: PORTABLE CHEST 1 VIEW COMPARISON:  June 10, 2019 chest radiograph and chest CT Jun 15, 2019 FINDINGS: There are persistent pleural effusions bilaterally, larger on the left than on the right, stable. There is compressive atelectasis in the left lower lobe with questionable superimposed pneumonia, stable. No new opacity evident. Heart is upper normal in size with pacemaker leads attached to right atrium and right ventricle. No adenopathy. Status post internal mammary bypass grafting. There is aortic atherosclerosis. Postoperative changes noted in the proximal left humerus. Bones are osteoporotic. IMPRESSION: Pleural  effusions bilaterally, larger on the left than on the right, stable. Probable compressive atelectasis with questionable superimposed pneumonia left base, stable. No new opacity evident. Stable cardiac silhouette. Pacemaker leads attached to right atrium and right ventricle. Aortic Atherosclerosis (ICD10-I70.0).  Bones osteoporotic. Electronically Signed   By: Lowella Grip III M.D.   On: 06/18/2019 11:27   DG HIP UNILAT WITH PELVIS 2-3 VIEWS RIGHT  Result Date: 06/18/2019 CLINICAL DATA:  Multiple recent falls, moderate to severe RIGHT hip pain. EXAM: DG HIP (WITH OR WITHOUT PELVIS) 2-3V RIGHT COMPARISON:  Plain film of the pelvis and LEFT hip dated 06/15/2019. Plain film of the pelvis dated 08/14/2017. FINDINGS: Osseous alignment is normal. Diffuse osteopenia limits characterization of osseous detail, however, there is no fracture line or displaced fracture fragment identified. Mild degenerative change at each hip. Atherosclerotic vascular calcifications within the bilateral upper thighs. Soft tissues about the pelvis and RIGHT hip are otherwise unremarkable IMPRESSION: No acute findings. No osseous fracture or dislocation. Osteopenia. Electronically Signed   By: Franki Cabot M.D.   On: 06/18/2019 11:30    Procedures Procedures (including critical care time)  Medications Ordered in ED Medications  HYDROmorphone (DILAUDID) injection 0.5 mg (0.5 mg Intravenous Given 06/18/19 1041)  ondansetron (ZOFRAN) injection 4 mg (4 mg Intravenous Given 06/18/19 1041)  sodium chloride 0.9 % bolus 250 mL (0 mLs Intravenous Stopped 06/18/19 1419)  HYDROmorphone (DILAUDID) injection 0.5 mg (0.5 mg Intravenous Given 06/18/19 1410)    ED Course  I have reviewed the triage vital signs and the nursing notes.  Pertinent labs & imaging results that were available during my care of the patient were reviewed by me  and considered in my medical decision making (see chart for details).    CRITICAL CARE Performed by: Milton Ferguson Total critical care time: 40 minutes Critical care time was exclusive of separately billable procedures and treating other patients. Critical care was necessary to treat or prevent imminent or life-threatening deterioration. Critical care was time spent personally by me on the following activities: development of treatment plan with patient and/or surrogate as well as nursing, discussions with consultants, evaluation of patient's response to treatment, examination of patient, obtaining history from patient or surrogate, ordering and performing treatments and interventions, ordering and review of laboratory studies, ordering and review of radiographic studies, pulse oximetry and re-evaluation of patient's condition.  MDM Rules/Calculators/A&P                      Patient with history of congestive heart failure with multiple falls and now significant hip pain     This patient presents to the ED for concern of fall this involves an extensive number of treatment options, and is a complaint that carries with it a high risk of complications and morbidity.  The differential diagnosis includes hip fracture   Lab Tests:   I Ordered, reviewed, and interpreted labs, which included CBC chemistries which show anemia hyponatremia  Medicines ordered:   I ordered medication pain medicines for hip pain  Imaging Studies ordered:   I ordered imaging studies which included CT head cervical spine chest and right hip and  I independently visualized and interpreted imaging which showed fractured right hip  Additional history obtained:   Additional history obtained from family member  Previous records obtained and reviewed   Consultations Obtained:   I consulted colleague in emergency medicine and discussed lab and imaging findings plain films did not show fracture but my colleague will follow up CT scan  Reevaluation:  After the interventions stated above, I reevaluated the patient  and found mild improvement from pain medicine  Critical Interventions:  .   Final Clinical Impression(s) / ED Diagnoses Final diagnoses:  Pain    Rx / DC Orders ED Discharge Orders    None       Milton Ferguson, MD 06/20/19 [phone]

## 2019-06-18 NOTE — Consult Note (Signed)
NAME:  Holly Bolton, MRN:  [id number], DOB:  [birth year], LOS: 0 ADMISSION DATE:  06/18/2019, CONSULTATION DATE:  06/18/19 REFERRING MD:  Ronnald Nian, CHIEF COMPLAINT:   B/l effusions, pneumothorax  Brief History   Hip fracture, tiny pneumothorax, L>R chronic pleural effusions  History of present illness   Holly Bolton is a 86 year old woman with a history of HFpEF, sick sinus syndrome, A. fib, A- flutter status post pacemaker placement, CAD status post CABG, aortic aneurysm status post aortic bioprosthetic valve replacement in 1999 who presents after a fall early this morning with ongoing left-sided chest pain and right hip pain.  She had a fall at home few days ago and hit her head, but did not hit her head today.  She was previously on Eliquis, which was stopped about 3 weeks ago due to significant bruising.  She is relatively inactive at home, walking greater than 100 feet only every few days.  She has no shortness of breath at rest, but gets dyspneic with more than her usual activity. She is a never smoker.  Her son provided most of the history due to sedation from pain medications.  During her evaluation in ED she was noted to have bilateral pleural effusions and a very small anterior right pneumothorax.  Hip fracture was noted on CT scan, and they are tentatively planning for surgery tomorrow.  Pulmonology was consulted for evaluation of pneumothorax and bilateral pleural effusions.  Past Medical History  Aortic aneurysm s/p repair and AVR CAD s/p CABG Carotid stenosis CKD 4 Hypertension Hypothyroidism Mild cognitive impairment Pacemaker for CHB, SSS, Aflutter & fib- chronic amiodarone HFpEF (LVEF 45-50%, pacing induced dyssynchrony)  Significant Hospital Events     Consults:  PCCM ortho  Procedures:    Significant Diagnostic Tests:  5/5 CT chest- tiny anterior right pneumothorax.  Left greater than right pleural effusions with overlying compressive atelectasis.  Significant  kyphosis.  Senescent vascular changes.  RML pulmonary nodule.  Right hip CT-angulated fracture proximal right femur intertrochanteric region  05/30/19 echo: LVEF 45-50%, mild concentric LVH, G2DD, moderate MR, mod to severe TR, moderately elevated PASP. Mild AR.   Micro Data:  Covid negative  Antimicrobials:    Interim history/subjective:    Objective   Blood pressure (!) 137/50, pulse 72, temperature (!) 97.4 F (36.3 C), temperature source Oral, resp. rate 18, height 5\' 2"  (1.575 m), weight 38.6 kg, SpO2 96 %.        Intake/Output Summary (Last 24 hours) at 06/18/2019 1647 Last data filed at 06/18/2019 1419 Gross per 24 hour  Intake 250 ml  Output --  Net 250 ml   Filed Weights   06/18/19 1041  Weight: 38.6 kg    Examination: General: Frail appearing elderly woman lying in bed sleeping in no acute distress HENT: [place], bruising around left eye. Eyes anicteric, oral mucosa moist Lungs: Breathing comfortably on 2 L nasal cannula, no accessory muscle use.  CTA anteriorly, reduced bibasilar breath sounds. Cardiovascular: Regular rate and rhythm, systolic murmur Abdomen: Thin, soft, nontender Extremities: Pain with any movement of her right leg.  Slightly externally rotated. No significant pretibial edema. Neuro: Arouses easily from sleep, but falls back asleep quickly.  Globally very weak. Derm: Very thin skin, bruising.  Well-healed sternotomy scar  Resolved Hospital Problem list     Assessment & Plan:  Tiny right pneumothorax-likely posttraumatic.  No current indication for a chest tube. -Follow-up chest x-ray tomorrow morning.  If it is enlarged, will require chest tube placement  prior to going to surgery.  Chronic left greater than right pleural effusions, likely due to chronic heart failure.  These have been present for months- years.  -The risk versus benefits of draining these effusions favors monitoring them for now. Her son the risk of reexpansion pulmonary edema  and reexpansion pain that can be associated with chronic effusions.  We will follow-up chest x-ray to ensure that they are not significantly enlarging overnight, which could raise suspicion for hemorrhagic effusion.  ARISCAT score =59 (high risk/ 42.1% for post-op pulmonary complications, mostly related to age, anemia, need for emergency surgery, pre-op hypoxia) -Recommend good pulmonary hygiene postoperatively.  I discussed the risks with her son of pneumonia associated with her chest pain from her fall that could lead to splinting. -DVT prophylaxis when appropriate postoperatively -Out of bed mobility as appropriate postoperatively -Avoid postoperative hypervolemia  Deconditioning, frailty -Overall portends a poor prognosis  Best practice:  Per primary Family Communication: updated son at bedside  Labs   CBC: Recent Labs  Lab 06/18/19 1048  WBC 7.4  NEUTROABS 5.4  HGB 10.4*  HCT 33.1*  MCV 105.1*  PLT 417    Basic Metabolic Panel: Recent Labs  Lab 06/18/19 1048  NA 141  K 4.4  CL 104  CO2 27  GLUCOSE 129*  BUN 45*  CREATININE 2.01*  CALCIUM 8.7*   GFR: Estimated Creatinine Clearance: 10.4 mL/min (A) (by C-G formula based on SCr of 2.01 mg/dL (H)). Recent Labs  Lab 06/18/19 1048  WBC 7.4    Liver Function Tests: Recent Labs  Lab 06/18/19 1048  AST 23  ALT 20  ALKPHOS 102  BILITOT 0.9  PROT 6.5  ALBUMIN 3.3*   No results for input(s): LIPASE, AMYLASE in the last 168 hours. No results for input(s): AMMONIA in the last 168 hours.  ABG No results found for: PHART, PCO2ART, PO2ART, HCO3, TCO2, ACIDBASEDEF, O2SAT   Coagulation Profile: No results for input(s): INR, PROTIME in the last 168 hours.  Cardiac Enzymes: No results for input(s): CKTOTAL, CKMB, CKMBINDEX, TROPONINI in the last 168 hours.  HbA1C: No results found for: HGBA1C  CBG: No results for input(s): GLUCAP in the last 168 hours.  Review of Systems:    Review of Systems: (bold if  positive, otherwise negative)  General: fevers, chills, sweats HENT: rhinorrhea, congestion, sore throat Eyes: blurry vision, double vision Cardio: chest pain, palpitations, edema Pulm: wheezing, cough, sputum production, hemoptysis, SOB Abd: heartburn, nausea, vomiting, diarrhea, bloody stools, abdominal pain GU: hematuria, dysuria Derm: rashes, wounds Heme/Lymph: adenopathy, bruising Neuro: syncope, headache, vertigo, numbness, weakness   Past Medical History  She,  has a past medical history of Aneurysm (arteriovenous) of coronary vessels, Carotid stenosis (04/28/08), Chronic kidney disease, stage 3, Hyperlipemia, Hypertension, Hypertensive chronic kidney disease, Hypothyroidism, unspecified (02/13/2018), Mild cognitive impairment, so stated (03/28/2018), Mild intermittent asthma, Occlusion and stenosis of bilateral carotid arteries, Pacemaker generator end of life (11/18/08), Peripheral arterial disease ([place]), Presence of permanent cardiac pacemaker (08/26/01), Presence of xenogenic heart valve, PVD (peripheral vascular disease) ([facility]), SSS (sick sinus syndrome) ([place]), and Status post ascending aortic aneurysm repair/AVR -  Medtronic Freestyle root (09/28/2014).   Surgical History    Past Surgical History:  Procedure Laterality Date  . ABDOMINAL HYSTERECTOMY  1966  . APPENDECTOMY  1943  . ASCENDING AORTIC ANEURYSM REPAIR  09/22/97   porcine aortic root  . BREAST FIBROADENOMA SURGERY  11/89  . CARDIAC SURGERY  09/22/1997   aortic valve root repair with porcine at time  of CABG  . CARDIOVERSION N/A 02/10/2019   Procedure: CARDIOVERSION;  Surgeon: Skeet Latch, MD;  Location: [facility];  Service: Cardiovascular;  Laterality: N/A;  . CORONARY ARTERY BYPASS GRAFT  09/22/97   LIMA to the LAD  . I & D EXTREMITY Left 08/15/2017   Procedure: IRRIGATION AND DEBRIDEMENT EXTREMITY;  Surgeon: Nicholes Stairs, MD;  Location: [facility];  Service: Orthopedics;  Laterality: Left;  . [place]   left  . NEPHRECTOMY  1958   right  . ORIF HUMERUS FRACTURE Left 08/15/2017   Procedure: OPEN REDUCTION INTERNAL FIXATION (ORIF) PROXIMAL HUMERUS FRACTURE;  Surgeon: Nicholes Stairs, MD;  Location: [facility];  Service: Orthopedics;  Laterality: Left;  . OVARIAN CYST SURGERY  1948  . PACEMAKER GENERATOR CHANGE  11/18/08   [place]  . PACEMAKER INSERTION  08/26/01   [place]  . PPM GENERATOR CHANGEOUT N/A 05/16/2017   Procedure: PPM GENERATOR CHANGEOUT;  Surgeon: Sanda Klein, MD;  Location: [facility];  Service: Cardiovascular;  Laterality: N/A;     Social History   reports that she has never smoked. She has never used smokeless tobacco. She reports that she does not drink alcohol or use drugs.   Family History   Her family history includes Heart attack in her brother, father, and mother; Hyperlipidemia in her sister; Hypertension in her sister.   Allergies Allergies  Allergen Reactions  . Accupril [Quinapril Hcl] Other (See Comments)    Unknown reaction   . Amlodipine Swelling  . Benadryl [Diphenhydramine Hcl] Other (See Comments)    Unknown reaction   . Biaxin [Clarithromycin] Other (See Comments)    Unknown reaction   . Ciprofloxacin Nausea And Vomiting  . Codeine Other (See Comments)    Unknown reaction   . Diovan [Valsartan] Other (See Comments)    Unknown reaction   . Medrol [Methylprednisolone] Other (See Comments)    Unknown  . Morphine And Related Other (See Comments)    Unknown reaction   . Neurontin [Gabapentin] Other (See Comments)    Unknown reaction   . Penicillins     Unknown reaction  Did it involve swelling of the face/tongue/throat, SOB, or low BP? Unknown Did it involve sudden or severe rash/hives, skin peeling, or any reaction on the inside of your mouth or nose? Unknown Did you need to seek medical attention at a hospital or doctor's office? Unknown When did it last happen?unknown If all above answers are "NO", may proceed  with cephalosporin use.       Home Medications  Prior to Admission medications   Medication Sig Start Date End Date Taking? Authorizing Provider  amiodarone (PACERONE) 200 MG tablet TAKE ONE TABLET BY MOUTH TWICE A DAY FOR 1 WEEK THEN REDUCE TO 1 TABLET DAILY THEREAFTER Patient taking differently: Take 200 mg by mouth daily. TAKE ONE TABLET BY MOUTH TWICE A DAY FOR 1 WEEK THEN REDUCE TO 1 TABLET DAILY THEREAFTER 06/12/19  Yes Croitoru, Mihai, MD  aspirin 325 MG tablet Take 325 mg by mouth every 4 (four) hours as needed for mild pain.   Yes [provider]  aspirin 81 MG chewable tablet Chew 81 mg by mouth daily.   Yes [provider]  brimonidine (ALPHAGAN) 0.2 % ophthalmic solution Place 1 drop into both eyes 2 (two) times daily.  03/31/16  Yes [provider]  carvedilol (COREG) 3.125 MG tablet TAKE ONE TABLET BY MOUTH TWICE A DAY WITH FOOD Patient taking differently: Take  3.125 mg by mouth in the morning and at bedtime.  03/24/19  Yes Reed, Tiffany L, DO  Cholecalciferol (VITAMIN D) 50 MCG (2000 UT) CAPS Take 2,000 Units by mouth at bedtime.    Yes [provider]  Coenzyme Q10 (COQ10) 200 MG CAPS Take 200 mg by mouth daily.   Yes [provider]  diphenhydramine-acetaminophen (TYLENOL PM) 25-500 MG TABS tablet Take 0.5 tablets by mouth at bedtime as needed (sleep).   Yes [provider]  furosemide (LASIX) 20 MG tablet Take 1 tablet (20 mg total) by mouth daily. 03/25/19  Yes Reed, Tiffany L, DO  lidocaine (LIDODERM) 5 % Place 1 patch onto the skin daily as needed (pain). Remove & Discard patch within 12 hours or as directed by MD   Yes [provider]  lidocaine (LMX) 4 % cream Apply 1 application topically daily as needed (pain).   Yes [provider]  vitamin B-12 (CYANOCOBALAMIN) 500 MCG tablet Take 500 mcg by mouth daily.   Yes [provider]  albuterol (ACCUNEB) 0.63 MG/3ML nebulizer solution Take 3 mLs (0.63  mg total) by nebulization every 6 (six) hours as needed for wheezing. Patient not taking: Reported on 06/18/2019 02/17/19   Gayland Curry, DO     Julian Hy, DO 06/18/19 5:58 PM [facility]

## 2019-06-19 ENCOUNTER — Encounter (HOSPITAL_COMMUNITY)
Admission: EM | Disposition: A | Discharge status: Skilled Nursing Facility | Payer: Self-pay | Source: Home / Self Care | Attending: Student

## 2019-06-19 ENCOUNTER — Inpatient Hospital Stay (HOSPITAL_COMMUNITY): Payer: Medicare Other

## 2019-06-19 ENCOUNTER — Encounter (HOSPITAL_COMMUNITY): Payer: Self-pay | Admitting: Internal Medicine

## 2019-06-19 ENCOUNTER — Inpatient Hospital Stay (HOSPITAL_COMMUNITY): Payer: Medicare Other | Admitting: Anesthesiology

## 2019-06-19 DIAGNOSIS — I5022 Chronic systolic (congestive) heart failure: Secondary | ICD-10-CM

## 2019-06-19 DIAGNOSIS — I10 Essential (primary) hypertension: Secondary | ICD-10-CM

## 2019-06-19 DIAGNOSIS — S72001A Fracture of unspecified part of neck of right femur, initial encounter for closed fracture: Secondary | ICD-10-CM

## 2019-06-19 DIAGNOSIS — I2510 Atherosclerotic heart disease of native coronary artery without angina pectoris: Secondary | ICD-10-CM

## 2019-06-19 DIAGNOSIS — I4819 Other persistent atrial fibrillation: Secondary | ICD-10-CM

## 2019-06-19 DIAGNOSIS — Z950 Presence of cardiac pacemaker: Secondary | ICD-10-CM

## 2019-06-19 DIAGNOSIS — S72001D Fracture of unspecified part of neck of right femur, subsequent encounter for closed fracture with routine healing: Secondary | ICD-10-CM

## 2019-06-19 DIAGNOSIS — E785 Hyperlipidemia, unspecified: Secondary | ICD-10-CM

## 2019-06-19 DIAGNOSIS — Z01818 Encounter for other preprocedural examination: Secondary | ICD-10-CM

## 2019-06-19 HISTORY — PX: INTRAMEDULLARY (IM) NAIL INTERTROCHANTERIC: SHX5875

## 2019-06-19 LAB — COMPREHENSIVE METABOLIC PANEL
ALT: 22 U/L (ref 0–44)
AST: 20 U/L (ref 15–41)
Albumin: 3.2 g/dL — ABNORMAL LOW (ref 3.5–5.0)
Alkaline Phosphatase: 102 U/L (ref 38–126)
Anion gap: 12 (ref 5–15)
BUN: 48 mg/dL — ABNORMAL HIGH (ref 8–23)
CO2: 28 mmol/L (ref 22–32)
Calcium: 8.5 mg/dL — ABNORMAL LOW (ref 8.9–10.3)
Chloride: 103 mmol/L (ref 98–111)
Creatinine, Ser: 2.01 mg/dL — ABNORMAL HIGH (ref 0.44–1.00)
GFR calc Af Amer: 24 mL/min — ABNORMAL LOW (ref >60)
GFR calc non Af Amer: 21 mL/min — ABNORMAL LOW (ref >60)
Glucose, Bld: 119 mg/dL — ABNORMAL HIGH (ref 70–99)
Potassium: 4.7 mmol/L (ref 3.5–5.1)
Sodium: 143 mmol/L (ref 135–145)
Total Bilirubin: 1.2 mg/dL (ref 0.3–1.2)
Total Protein: 6.4 g/dL — ABNORMAL LOW (ref 6.5–8.1)

## 2019-06-19 LAB — CBC
HCT: 32.9 % — ABNORMAL LOW (ref 36.0–46.0)
Hemoglobin: 10.3 g/dL — ABNORMAL LOW (ref 12.0–15.0)
MCH: 33.2 pg (ref 26.0–34.0)
MCHC: 31.3 g/dL (ref 30.0–36.0)
MCV: 106.1 fL — ABNORMAL HIGH (ref 80.0–100.0)
Platelets: 208 10*3/uL (ref 150–400)
RBC: 3.1 MIL/uL — ABNORMAL LOW (ref 3.87–5.11)
RDW: 17.2 % — ABNORMAL HIGH (ref 11.5–15.5)
WBC: 10.4 10*3/uL (ref 4.0–10.5)
nRBC: 0 % (ref 0.0–0.2)

## 2019-06-19 SURGERY — FIXATION, FRACTURE, INTERTROCHANTERIC, WITH INTRAMEDULLARY ROD
Anesthesia: Spinal | Laterality: Right

## 2019-06-19 MED ORDER — ONDANSETRON HCL 4 MG PO TABS: 4.0000 mg | ORAL_TABLET | Freq: Four times a day (QID) | ORAL | Status: DC | PRN

## 2019-06-19 MED ORDER — ALBUMIN HUMAN 5 % IV SOLN
12.5000 g | Freq: Once | INTRAVENOUS | Status: AC
  Administered 2019-06-19: 12.5 g via INTRAVENOUS

## 2019-06-19 MED ORDER — 0.9 % SODIUM CHLORIDE (POUR BTL) OPTIME
TOPICAL | Status: DC | PRN
  Administered 2019-06-19: 1000 mL

## 2019-06-19 MED ORDER — ACETAMINOPHEN 500 MG PO TABS
500.0000 mg | ORAL_TABLET | Freq: Four times a day (QID) | ORAL | Status: AC
  Administered 2019-06-19 – 2019-06-20 (×4): 500 mg via ORAL
  Filled 2019-06-19 (×4): qty 1

## 2019-06-19 MED ORDER — ACETAMINOPHEN 10 MG/ML IV SOLN
INTRAVENOUS | Status: AC
  Administered 2019-06-19: 500 mg via INTRAVENOUS
  Filled 2019-06-19: qty 100

## 2019-06-19 MED ORDER — ACETAMINOPHEN 325 MG PO TABS: 325.0000 mg | ORAL_TABLET | Freq: Four times a day (QID) | ORAL | Status: DC | PRN

## 2019-06-19 MED ORDER — POVIDONE-IODINE 10 % EX SWAB
2.0000 "application " | Freq: Once | CUTANEOUS | Status: AC
  Administered 2019-06-19: 2 via TOPICAL

## 2019-06-19 MED ORDER — KETAMINE HCL 10 MG/ML IJ SOLN
INTRAMUSCULAR | Status: AC
  Filled 2019-06-19: qty 1

## 2019-06-19 MED ORDER — ALBUMIN HUMAN 5 % IV SOLN
INTRAVENOUS | Status: AC
  Filled 2019-06-19: qty 250

## 2019-06-19 MED ORDER — PROPOFOL 500 MG/50ML IV EMUL
INTRAVENOUS | Status: DC | PRN
Start: 2019-06-19 — End: 2019-06-19
  Administered 2019-06-19 (×2): 10 mg via INTRAVENOUS

## 2019-06-19 MED ORDER — EPHEDRINE SULFATE 50 MG/ML IJ SOLN
INTRAMUSCULAR | Status: DC | PRN
  Administered 2019-06-19: 5 mg via INTRAVENOUS
  Administered 2019-06-19: 10 mg via INTRAVENOUS
  Administered 2019-06-19: 5 mg via INTRAVENOUS

## 2019-06-19 MED ORDER — OXYCODONE HCL 5 MG PO TABS: 5.0000 mg | ORAL_TABLET | Freq: Once | ORAL | Status: DC | PRN

## 2019-06-19 MED ORDER — BUPIVACAINE HCL (PF) 0.5 % IJ SOLN
INTRAMUSCULAR | Status: DC | PRN
Start: 2019-06-19 — End: 2019-06-19
  Administered 2019-06-19: 3 mL

## 2019-06-19 MED ORDER — MENTHOL 3 MG MT LOZG: 1.0000 | LOZENGE | OROMUCOSAL | Status: DC | PRN

## 2019-06-19 MED ORDER — TRAMADOL HCL 50 MG PO TABS: 50.0000 mg | ORAL_TABLET | Freq: Four times a day (QID) | ORAL | Status: DC | PRN

## 2019-06-19 MED ORDER — HYDROCODONE-ACETAMINOPHEN 7.5-325 MG PO TABS
1.0000 | ORAL_TABLET | ORAL | Status: DC | PRN
  Filled 2019-06-19: qty 1

## 2019-06-19 MED ORDER — HYDROCODONE-ACETAMINOPHEN 5-325 MG PO TABS
1.0000 | ORAL_TABLET | ORAL | Status: DC | PRN
  Administered 2019-06-21: 1 via ORAL
  Filled 2019-06-19: qty 1

## 2019-06-19 MED ORDER — OXYCODONE HCL 5 MG/5ML PO SOLN: 5.0000 mg | Freq: Once | ORAL | Status: DC | PRN

## 2019-06-19 MED ORDER — DEXAMETHASONE SODIUM PHOSPHATE 10 MG/ML IJ SOLN
INTRAMUSCULAR | Status: AC
  Filled 2019-06-19: qty 1

## 2019-06-19 MED ORDER — FENTANYL CITRATE (PF) 100 MCG/2ML IJ SOLN: 25.0000 ug | INTRAMUSCULAR | Status: DC | PRN

## 2019-06-19 MED ORDER — FENTANYL CITRATE (PF) 100 MCG/2ML IJ SOLN
INTRAMUSCULAR | Status: AC
  Filled 2019-06-19: qty 2

## 2019-06-19 MED ORDER — PROPOFOL 500 MG/50ML IV EMUL: INTRAVENOUS | Status: DC | PRN

## 2019-06-19 MED ORDER — SODIUM CHLORIDE 0.9 % IV SOLN
INTRAVENOUS | Status: DC
  Administered 2019-06-19: 500 mL via INTRAVENOUS

## 2019-06-19 MED ORDER — METOCLOPRAMIDE HCL 5 MG/ML IJ SOLN: 5.0000 mg | Freq: Three times a day (TID) | INTRAMUSCULAR | Status: DC | PRN

## 2019-06-19 MED ORDER — HALOPERIDOL LACTATE 5 MG/ML IJ SOLN: 1.0000 mg | Freq: Once | INTRAMUSCULAR | Status: DC | PRN

## 2019-06-19 MED ORDER — ONDANSETRON HCL 4 MG/2ML IJ SOLN
INTRAMUSCULAR | Status: DC | PRN
  Administered 2019-06-19: 4 mg via INTRAVENOUS

## 2019-06-19 MED ORDER — PHENOL 1.4 % MT LIQD: 1.0000 | OROMUCOSAL | Status: DC | PRN

## 2019-06-19 MED ORDER — FENTANYL CITRATE (PF) 100 MCG/2ML IJ SOLN
INTRAMUSCULAR | Status: DC | PRN
  Administered 2019-06-19: 15 ug via INTRAVENOUS

## 2019-06-19 MED ORDER — STERILE WATER FOR IRRIGATION IR SOLN
Status: DC | PRN
  Administered 2019-06-19: 2000 mL

## 2019-06-19 MED ORDER — CLINDAMYCIN PHOSPHATE 900 MG/50ML IV SOLN
INTRAVENOUS | Status: AC
  Filled 2019-06-19: qty 50

## 2019-06-19 MED ORDER — ETOMIDATE 2 MG/ML IV SOLN
INTRAVENOUS | Status: AC
  Filled 2019-06-19: qty 10

## 2019-06-19 MED ORDER — ONDANSETRON HCL 4 MG/2ML IJ SOLN: 4.0000 mg | Freq: Once | INTRAMUSCULAR | Status: DC | PRN

## 2019-06-19 MED ORDER — POVIDONE-IODINE 10 % EX SWAB: 2.0000 "application " | Freq: Once | CUTANEOUS | Status: DC

## 2019-06-19 MED ORDER — ONDANSETRON HCL 4 MG/2ML IJ SOLN: 4.0000 mg | Freq: Four times a day (QID) | INTRAMUSCULAR | Status: DC | PRN

## 2019-06-19 MED ORDER — PHENYLEPHRINE HCL-NACL 10-0.9 MG/250ML-% IV SOLN
INTRAVENOUS | Status: DC | PRN
  Administered 2019-06-19: 50 ug/min via INTRAVENOUS

## 2019-06-19 MED ORDER — PROPOFOL 10 MG/ML IV BOLUS
INTRAVENOUS | Status: AC
  Filled 2019-06-19: qty 20

## 2019-06-19 MED ORDER — MIDAZOLAM HCL 2 MG/2ML IJ SOLN
INTRAMUSCULAR | Status: AC
  Filled 2019-06-19: qty 2

## 2019-06-19 MED ORDER — CLINDAMYCIN PHOSPHATE 900 MG/50ML IV SOLN
900.0000 mg | INTRAVENOUS | Status: AC
  Administered 2019-06-19: 11:00:00 900 mg via INTRAVENOUS
  Filled 2019-06-19: qty 50

## 2019-06-19 MED ORDER — METOCLOPRAMIDE HCL 5 MG PO TABS: 5.0000 mg | ORAL_TABLET | Freq: Three times a day (TID) | ORAL | Status: DC | PRN

## 2019-06-19 MED ORDER — ACETAMINOPHEN 10 MG/ML IV SOLN
INTRAVENOUS | Status: AC
  Filled 2019-06-19: qty 100

## 2019-06-19 MED ORDER — LACTATED RINGERS IV SOLN: INTRAVENOUS | Status: DC | PRN

## 2019-06-19 MED ORDER — CHLORHEXIDINE GLUCONATE CLOTH 2 % EX PADS: 6.0000 | MEDICATED_PAD | Freq: Every day | CUTANEOUS | Status: DC

## 2019-06-19 MED ORDER — CHLORHEXIDINE GLUCONATE 4 % EX LIQD
60.0000 mL | Freq: Once | CUTANEOUS | Status: DC
  Filled 2019-06-19: qty 60

## 2019-06-19 MED ORDER — DOCUSATE SODIUM 100 MG PO CAPS
100.0000 mg | ORAL_CAPSULE | Freq: Two times a day (BID) | ORAL | Status: DC
  Administered 2019-06-19 – 2019-06-24 (×8): 100 mg via ORAL
  Filled 2019-06-19 (×10): qty 1

## 2019-06-19 MED ORDER — ACETAMINOPHEN 10 MG/ML IV SOLN: 500.0000 mg | Freq: Once | INTRAVENOUS | Status: AC

## 2019-06-19 SURGICAL SUPPLY — 39 items
BAG SPEC THK2 15X12 ZIP CLS (MISCELLANEOUS) ×1
BAG ZIPLOCK 12X15 (MISCELLANEOUS) ×3 IMPLANT
BIT DRILL CANN LG 4.3MM (BIT) IMPLANT
BNDG GAUZE ELAST 4 BULKY (GAUZE/BANDAGES/DRESSINGS) ×3 IMPLANT
COVER PERINEAL POST (MISCELLANEOUS) ×3 IMPLANT
COVER SURGICAL LIGHT HANDLE (MISCELLANEOUS) ×3 IMPLANT
COVER WAND RF STERILE (DRAPES) IMPLANT
DRAPE INCISE IOBAN 66X45 STRL (DRAPES) ×3 IMPLANT
DRILL BIT CANN LG 4.3MM (BIT) ×2
DRSG MEPILEX BORDER 4X8 (GAUZE/BANDAGES/DRESSINGS) ×6 IMPLANT
DURAPREP 26ML APPLICATOR (WOUND CARE) ×3 IMPLANT
ELECT PENCIL ROCKER SW 15FT (MISCELLANEOUS) ×2 IMPLANT
ELECT REM PT RETURN 15FT ADLT (MISCELLANEOUS) ×3 IMPLANT
GAUZE XEROFORM 1X8 LF (GAUZE/BANDAGES/DRESSINGS) ×2 IMPLANT
GLOVE BIO SURGEON STRL SZ7.5 (GLOVE) ×3 IMPLANT
GLOVE BIOGEL PI IND STRL 8 (GLOVE) ×1 IMPLANT
GLOVE BIOGEL PI INDICATOR 8 (GLOVE) ×2
GOWN STRL REUS W/TWL LRG LVL3 (GOWN DISPOSABLE) ×3 IMPLANT
GUIDEPIN 3.2X17.5 THRD DISP (PIN) ×4 IMPLANT
HIP FRAC NAIL LAG SCR 10.5X100 (Orthopedic Implant) ×3 IMPLANT
KIT BASIN (CUSTOM PROCEDURE TRAY) ×3 IMPLANT
KIT TURNOVER KIT A (KITS) IMPLANT
MANIFOLD NEPTUNE II (INSTRUMENTS) ×3 IMPLANT
NAIL HIP FRACT 130D 9X180 (Orthopedic Implant) ×2 IMPLANT
NS IRRIG 1000ML POUR BTL (IV SOLUTION) ×3 IMPLANT
PACK GENERAL/GYN (CUSTOM PROCEDURE TRAY) ×3 IMPLANT
PENCIL SMOKE EVACUATOR (MISCELLANEOUS) IMPLANT
PROTECTOR NERVE ULNAR (MISCELLANEOUS) ×3 IMPLANT
SCREW BONE CORTICAL 5.0X32 (Screw) ×2 IMPLANT
SCREW CANN THRD AFF 10.5X100 (Orthopedic Implant) IMPLANT
STAPLER VISISTAT 35W (STAPLE) ×5 IMPLANT
SUT VIC AB 0 CT1 36 (SUTURE) ×3 IMPLANT
SUT VIC AB 2-0 CT1 27 (SUTURE) ×6
SUT VIC AB 2-0 CT1 27XBRD (SUTURE) ×1 IMPLANT
SUT VIC AB 2-0 CT1 TAPERPNT 27 (SUTURE) IMPLANT
TOWEL OR 17X26 10 PK STRL BLUE (TOWEL DISPOSABLE) ×6 IMPLANT
TRAY FOLEY MTR SLVR 14FR STAT (SET/KITS/TRAYS/PACK) ×2 IMPLANT
WATER STERILE IRR 1000ML POUR (IV SOLUTION) ×6 IMPLANT
distal graduated drill long ×2 IMPLANT

## 2019-06-19 NOTE — Progress Notes (Signed)
Palliative Medicine Team consult was received.   Patient in surgery for R hip fracture.  Will plan to attempt to see again tomorrow.   If there are urgent needs or questions please call [phone]. Thank you for consulting out team to assist with this patients care.  Micheline Rough, MD [facility] Team [phone]  NO CHARGE NOTE

## 2019-06-19 NOTE — Transfer of Care (Signed)
Immediate Anesthesia Transfer of Care Note  Patient: Holly Bolton  Procedure(s) Performed: INTRAMEDULLARY (IM) NAIL INTERTROCHANTRIC (Right )  Patient Location: PACU  Anesthesia Type:Spinal  Level of Consciousness: awake, alert  and patient cooperative  Airway & Oxygen Therapy: Patient Spontanous Breathing and Patient connected to face mask oxygen  Post-op Assessment: Report given to RN and Post -op Vital signs reviewed and stable  Post vital signs: Reviewed and stable  Last Vitals:  Vitals Value Taken Time  BP 127/44 06/19/19 1150  Temp    Pulse 59 06/19/19 1151  Resp 15 06/19/19 1151  SpO2 99 % 06/19/19 1151  Vitals shown include unvalidated device data.  Last Pain:  Vitals:   06/19/19 0955  TempSrc: Oral  PainSc: 8          Complications: No apparent anesthesia complications

## 2019-06-19 NOTE — Anesthesia Postprocedure Evaluation (Signed)
Anesthesia Post Note  Patient: Holly Bolton  Procedure(s) Performed: INTRAMEDULLARY (IM) NAIL INTERTROCHANTRIC (Right )     Patient location during evaluation: PACU Anesthesia Type: Spinal Level of consciousness: oriented and awake and alert Pain management: pain level controlled Vital Signs Assessment: post-procedure vital signs reviewed and stable Respiratory status: spontaneous breathing, respiratory function stable and nonlabored ventilation Cardiovascular status: blood pressure returned to baseline and stable Postop Assessment: no headache, no backache, no apparent nausea or vomiting and spinal receding Anesthetic complications: no    Last Vitals:  Vitals:   06/19/19 1245 06/19/19 1250  BP: (!) 104/41 (!) 108/43  Pulse: (!) 58 (!) 59  Resp: 14 11  Temp:    SpO2: 95% 98%    Last Pain:  Vitals:   06/19/19 1245  TempSrc:   PainSc: Asleep                 Lidia Collum

## 2019-06-19 NOTE — Discharge Instructions (Signed)
Discharge Instructions after Hip surgery   You can bear weight and ambulate as tolerated on both legs Use ice on the hip intermittently over the first 48 hours after surgery.  Pain medicine has been prescribed for you.  Use your medicine liberally over the first 48 hours, and then you can begin to taper your use. You may take Extra Strength Tylenol or Tylenol only in place of the pain pills. DO NOT take ANY nonsteroidal anti-inflammatory pain medications: Advil, Motrin, Ibuprofen, Aleve, Naproxen or Naprosyn.  Take aspirin or anticoagulant medication as prescribed for 2 weeks after surgery. Please notify if allergic or sensitivity to aspirin. You may remove your dressing after two days.  You may shower 5 days after surgery. The incisions CANNOT get wet prior to 5 days. Simply allow the water to wash over the site and then pat dry. Do not rub the incisions.    Please call [phone] during normal business hours or [phone] after hours for any problems. Including the following:  - excessive redness of the incisions - drainage for more than 4 days - fever of more than 101.5 F  *Please note that pain medications will not be refilled after hours or on weekends.

## 2019-06-19 NOTE — Anesthesia Procedure Notes (Signed)
Spinal  Patient location during procedure: OR Staffing Performed: anesthesiologist  Anesthesiologist: Aquiles Ruffini E, MD Preanesthetic Checklist Completed: patient identified, IV checked, risks and benefits discussed, surgical consent, monitors and equipment checked, pre-op evaluation and timeout performed Spinal Block Patient position: sitting Prep: DuraPrep and site prepped and draped Patient monitoring: continuous pulse ox, blood pressure and heart rate Approach: midline Location: L3-4 Injection technique: single-shot Needle Needle type: Quincke  Needle gauge: 22 G Needle length: 9 cm Additional Notes Functioning IV was confirmed and monitors were applied. Sterile prep and drape, including hand hygiene and sterile gloves were used. The patient was positioned and the spine was prepped. The skin was anesthetized with lidocaine.  Free flow of clear CSF was obtained prior to injecting local anesthetic into the CSF. The needle was carefully withdrawn. The patient tolerated the procedure well.      

## 2019-06-19 NOTE — Progress Notes (Signed)
Patient left for surgery with son and transporter

## 2019-06-19 NOTE — Progress Notes (Signed)
   PATIENT ID: Holly Bolton     Procedure(s) (LRB): INTRAMEDULLARY (IM) NAIL INTERTROCHANTRIC (Right)  Subjective: no new c/o overnight.  Objective:  Vitals:   06/18/19 2031 06/19/19 0452  BP: (!) 142/63 (!) 128/42  Pulse: (!) 59 (!) 59  Resp: 16   Temp: 98 F (36.7 C) 98.5 F (36.9 C)  SpO2: 100% 90%     Resting comfortably. No increased respiratory effort.  Labs:  Recent Labs    06/18/19 1048 06/19/19 0508  HGB 10.4* 10.3*   Recent Labs    06/18/19 1048 06/19/19 0508  WBC 7.4 10.4  RBC 3.15* 3.10*  HCT 33.1* 32.9*  PLT 221 208   Recent Labs    06/18/19 1048 06/19/19 0508  NA 141 143  K 4.4 4.7  CL 104 103  CO2 27 28  BUN 45* 48*  CREATININE 2.01* 2.01*  GLUCOSE 129* 119*  CALCIUM 8.7* 8.5*    Assessment and Plan:R hip intertroch fx Plan IMN today Await eval by cardiology and pulmonary Plan to get this fixed this am if possible. Appreciate hospitalist management

## 2019-06-19 NOTE — Progress Notes (Signed)
Patient arrived from PACU, oriented to self. Patient noted to be angry and agitated. Unable to redirect patient to keep oxygen on and heart monitor on. Patient attempting to hit staff. Haldol times one ordered.

## 2019-06-19 NOTE — Progress Notes (Signed)
Patient calm, lying in bed with eyes closed and respirations even. Son at bedside. Haldol not given at this time.

## 2019-06-19 NOTE — Op Note (Signed)
Procedure(s): INTRAMEDULLARY (IM) NAIL INTERTROCHANTRIC Procedure Note  Holly Bolton female 86 y.o. 06/19/2019  Preoperative diagnosis: Right intertrochanteric hip fracture  Postoperative diagnosis: Same  Procedure(s) and Anesthesia Type: Right INTRAMEDULLARY (IM) NAIL INTERTROCHANTRIC - Choice  Surgeon(s) and Role:    Tania Ade, MD - Primary   Indications:  86 y.o. female s/p fall with right hip fracture. Indicated for surgery to promote early ambulation, pain control and prevent complications of bed rest.     Surgeon: Isabella Stalling   Assistants: Jeanmarie Hubert PA-C (Danielle was present and scrubbed throughout the procedure and was essential in positioning, retraction, exposure, and closure)  Anesthesia: Spinal anesthesia    Procedure Detail  INTRAMEDULLARY (IM) NAIL INTERTROCHANTRIC  Findings: The fracture was nondisplaced.  It was internally fixed with a Biomet short affixus nail.  Estimated Blood Loss:  200 mL         Drains: none  Blood Given: none          Specimens: none        Complications:  * No complications entered in OR log *         Disposition: PACU - hemodynamically stable.         Condition: stable    Procedure:  The patient was identified in the preoperative holding area  where I personally marked the operative site after verifying site, side,  and procedure with the patient. She was taken back to the operating  room where general anesthesia was induced without complication. She was  placed on the fracture table with the right lower extremity in traction,  and opposite lower extremity in a flexed abducted position. The arms were well  padded. Fluoroscopic imaging was used to verify reduction with gentle traction  and internal rotation. The right hip was then prepped and draped in the standard sterile fashion. An approximately 3 cm incision was made proximal  to the palpable greater trochanter tip. Dissection was carried  down to  the tip and the short guidewire was placed under fluoroscopic imaging.  The proximal entry reamer was used to open the canal and the nail was then advanced down the femoral canal. the proximal jig was  then placed and a small 1.5 cm incision was made on the lateral thigh to  advance the lag screw guide against the lateral aspect of the femur.  The guide pin was advanced and its position was verified in AP and  lateral planes to be centered in the head.  The guidewire was over  reamed and the appropriate size lag screw was advanced. The  proximal set screw was then advanced, backed off a quarter turn to allow  sliding.  The interlocking screw was then placed through the distal aspect of the jig with fluoroscopic guidance.  The proximal jig was then  removed. Final fluoroscopic imaging in AP and lateral planes at the hip and the  knee demonstrated near anatomic reduction with appropriate length and  position of the hardware. All wounds were then copiously irrigated with  normal saline and subsequently closed in layers with #1 Vicryl in a deep  fascia layer, 2-0 Vicryl in a deep dermal layer, and staples for skin  closure. Sterile dressings were then applied including 4x4s and Mepilex  dressings. The patient was then taken off the fracture table,  transferred to the stretcher, and taken to the recovery room in stable  Condition.  POSTOPERATIVE PLAN: She will be weightbearing as tolerated on the operative extremity.

## 2019-06-19 NOTE — Consult Note (Signed)
Cardiology Consultation:   Patient ID: Holly Bolton MRN: [id number]; DOB: 1922/03/14  Admit date: 06/18/2019 Date of Consult: 06/19/2019  Primary Care Provider: Gayland Curry, DO Primary Cardiologist: Holly Klein, MD  Primary Electrophysiologist:  None    Patient Profile:   Holly Bolton is a 86 year old female with a history of CAD s/p single vessel CABG, biscuspid aortic valve s/p AVR (with Medtronic Freestyle root) and ascending aortic aneurysm repair at time of CABG, chronic combined CHF, persistent atrial fibrillation/flutter, sick sinus syndrome and 2nd degree AV blocker s/p PPM, PAD, hypertension, hyperlipidemia prefers not to be on statin, hypothyroidism, and CKD stage III who is being seen for pre-op evaluation for right intertrochanteric hip fracture at the request of Holly Bolton.   History of Present Illness:   Holly Bolton is a 86 year old female with the above history who is followed by Holly Bolton for her cardiac care. Patient has history of bicuspid aortic valve with severe aortic stenosis and ascending aortic aneurysm. She underwent aortic root replacement with a Medtronic Freestyle root and coronary reimplantation with single vessel LIMA to LAD bypass in 1999. She also has a history of sick sinus syndrome and high-grade AV blocker for which she had dual-chamber pacemaker placed. She had a pacemaker generator change in 2010 ([address]. [place] accent). She was hospitalized in April 2020 and again in December 2020 with acute exacerbation of heart failure. Hospitalization in December felt to be related to persistent atrial fibrillation for about a month prior to admission. Dry weight at discharge was around 85 lbs on her home scale. She underwent successful DCCV on 02/10/2019 after 1 month of uninterrupted anticoagulation. Unfortunately, she had recurrence of atrial flutter in March 2021. Overdrive pacing of atrial flutter appeared to be unsuccessful with conversion to atrial fibrillation.  Amiodarone was started but shortly after that procedure she spontaneously transitioned to normal rhythm.   Echo in December 2020 was interpreted as showing an EF of 30-35% although on Holly Bolton December read felt to be more around 45% (largely diminished due to marked pacing induced ventricular dyssynchrony). Most recent Echo on 05/30/2019 showed very similar findings. EF around 45-50%. There was evidence of elevated left and right heart filling pressures with pseudonormal mitral inflow and noncollapsing inferior vena cava. The aortic valve prosthesis is functioning well with only mild regurgitation and mild dilation of the aortic root. Also noted to have a large left pleural effusion, moderate MR, moderate to severe TR, and markedly elevated pulmonary artery pressure estimated at 67 mmHg also noted.   Patient recently seen by Holly Bolton on 06/02/2019. She was in normal rhythm (AV paced) at that time and there had been no recurrence of the atrial flutter or atrial fibrillation since the attempted overdrive pacing about one month prior. She continued to note shortness of breath with activity such as walking about 200 feet. It was hard to say whether there was any benefits with return to normal rhythm. She was noted to have a lot of spontaneous bruising on Eliquis. Therefore, decision was made to stop Eliquis.   Patient presented to the ED on 06/15/2019 after mechanical fall. She denied any head trauma with this fall but did report a falls several days prior where she struck her head. She also noted left chest wall tenderness as well as left hip pain at that time. Head CT showed no acute findings. Chest CT showed now acute fractures but did note a moderate left pleural effusion and small effusion  on the right. Hip x-ray also showed no fracture. A T3 compression fracture was found but this was felt to be old. Patient had no focal thoracic spine tenderness. Therefore, she was felt to be stable for discharge.   She  returned to the ED on 06/18/2019 with right hip and elbow pain after another fall. She was noted to be mildly hypoxic on arrival with O2 sats of 88% on room air which improved to 95% on 3L via nasal cannula. Patient underwent CT of head, spine, chest, and right hip which showed a slightly angulated fracture of the intertrochanteric region of the proximal right femur but no other acute findings. She was noted to have stable a right apical pneumothorax, large left and small right pleural effusion, stable dilatation of aortic arch measuring 4cm, and stable mild compression fracture of T3. Ortho was consulted and has recommend surgical fixation of right hp fracture once medically optimized, hoping that will be early this morning. PCCM was consulted for further evaluation of pneumothorax and pleural effusion. No intervention felt needed at this time.  Cardiology as consulted for pre-op evaluation. Difficult to obtain much of a history this morning. Patient received Dilaudid at 6:08 this morning and was still a little drowsy from that. However, son came after I left and I was able to go back and talk with him. Son states this was her 4th fall in the last month. He has never witnessed any of these falls but sounds mechanical in nature due to weakness and being unsteady on her feet. Son states patient has never complain of any cardiac symptoms prior to falling like chest pain, shortness of breath, palpitations, lightheadedness, dizziness. No LOC with falls. Patient initially told me she was having a little chest pain but could not elaborate. She then stated she was just hurting all over. Son states patient has never complained of chest pain. Son notes that patient has had gradually progressive worsening of dyspnea on exertion over the past year but she appears very frail. No recent shortness of breath at rest, orthopnea, PND, or edema recently.  Past Medical History:  Diagnosis Date  . Aneurysm (arteriovenous) of  coronary vessels    ascending aorta requiring bypass of the LAD  . Carotid stenosis 04/28/08   Doppler: <40% stenosis bilateral  . Chronic kidney disease, stage 3   . Hyperlipemia   . Hypertension   . Hypertensive chronic kidney disease   . Hypothyroidism, unspecified 02/13/2018   Symptoms well controlled with current therapy  . Mild cognitive impairment, so stated 03/28/2018   Symptoms poorly controlled, needs frequent adjustments in treatment and dose monitoring  . Mild intermittent asthma    Symptoms controlled with difficulty, affecting daily functioning, needs ongoing monitoring  . Occlusion and stenosis of bilateral carotid arteries   . Pacemaker generator end of life 11/18/08   Intermittent high-grade atrioventricular block  . Peripheral arterial disease (HCC)    left ABI of 0.78  . Presence of permanent cardiac pacemaker 08/26/01   Sinus node dysfunction-[place]  . Presence of xenogenic heart valve   . PVD (peripheral vascular disease) ([place])   . SSS (sick sinus syndrome) ([place])   . Status post ascending aortic aneurysm repair/AVR -  Medtronic Freestyle root 09/28/2014   ascending aorta requiring bypass of the LAD     Past Surgical History:  Procedure Laterality Date  . ABDOMINAL HYSTERECTOMY  1966  . APPENDECTOMY  1943  . ASCENDING AORTIC ANEURYSM REPAIR  09/22/97   porcine aortic  root  . BREAST FIBROADENOMA SURGERY  11/89  . CARDIAC SURGERY  09/22/1997   aortic valve root repair with porcine at time of CABG  . CARDIOVERSION N/A 02/10/2019   Procedure: CARDIOVERSION;  Surgeon: Skeet Latch, MD;  Location: [facility];  Service: Cardiovascular;  Laterality: N/A;  . CORONARY ARTERY BYPASS GRAFT  09/22/97   LIMA to the LAD  . I & D EXTREMITY Left 08/15/2017   Procedure: IRRIGATION AND DEBRIDEMENT EXTREMITY;  Surgeon: Nicholes Stairs, MD;  Location: [facility];  Service: Orthopedics;  Laterality: Left;  . [place]   left  . NEPHRECTOMY  1958   right    . ORIF HUMERUS FRACTURE Left 08/15/2017   Procedure: OPEN REDUCTION INTERNAL FIXATION (ORIF) PROXIMAL HUMERUS FRACTURE;  Surgeon: Nicholes Stairs, MD;  Location: [facility];  Service: Orthopedics;  Laterality: Left;  . OVARIAN CYST SURGERY  1948  . PACEMAKER GENERATOR CHANGE  11/18/08   [place]  . PACEMAKER INSERTION  08/26/01   [place]  . PPM GENERATOR CHANGEOUT N/A 05/16/2017   Procedure: PPM GENERATOR CHANGEOUT;  Surgeon: Holly Klein, MD;  Location: [facility];  Service: Cardiovascular;  Laterality: N/A;     Home Medications:  Prior to Admission medications   Medication Sig Start Date End Date Taking? Authorizing Provider  amiodarone (PACERONE) 200 MG tablet TAKE ONE TABLET BY MOUTH TWICE A DAY FOR 1 WEEK THEN REDUCE TO 1 TABLET DAILY THEREAFTER Patient taking differently: Take 200 mg by mouth daily. TAKE ONE TABLET BY MOUTH TWICE A DAY FOR 1 WEEK THEN REDUCE TO 1 TABLET DAILY THEREAFTER 06/12/19  Yes Croitoru, Mihai, MD  aspirin 325 MG tablet Take 325 mg by mouth every 4 (four) hours as needed for mild pain.   Yes [provider]  aspirin 81 MG chewable tablet Chew 81 mg by mouth daily.   Yes [provider]  brimonidine (ALPHAGAN) 0.2 % ophthalmic solution Place 1 drop into both eyes 2 (two) times daily.  03/31/16  Yes [provider]  carvedilol (COREG) 3.125 MG tablet TAKE ONE TABLET BY MOUTH TWICE A DAY WITH FOOD Patient taking differently: Take 3.125 mg by mouth in the morning and at bedtime.  03/24/19  Yes Reed, Tiffany L, DO  Cholecalciferol (VITAMIN D) 50 MCG (2000 UT) CAPS Take 2,000 Units by mouth at bedtime.    Yes [provider]  Coenzyme Q10 (COQ10) 200 MG CAPS Take 200 mg by mouth daily.   Yes [provider]  diphenhydramine-acetaminophen (TYLENOL PM) 25-500 MG TABS tablet Take 0.5 tablets by mouth at bedtime as needed (sleep).   Yes [provider]  furosemide (LASIX) 20 MG tablet Take 1 tablet (20 mg total) by  mouth daily. 03/25/19  Yes Reed, Tiffany L, DO  lidocaine (LIDODERM) 5 % Place 1 patch onto the skin daily as needed (pain). Remove & Discard patch within 12 hours or as directed by MD   Yes [provider]  lidocaine (LMX) 4 % cream Apply 1 application topically daily as needed (pain).   Yes [provider]  vitamin B-12 (CYANOCOBALAMIN) 500 MCG tablet Take 500 mcg by mouth daily.   Yes [provider]    Inpatient Medications: Scheduled Meds: . amiodarone  200 mg Oral Daily  . aspirin  81 mg Oral Daily  . brimonidine  1 drop Both Eyes BID  . carvedilol  3.125 mg Oral BID WC  . cholecalciferol  2,000 Units Oral QHS  .  docusate sodium  100 mg Oral BID  . furosemide  20 mg Oral Daily  . sodium chloride flush  3 mL Intravenous Q12H  . sodium chloride flush  3 mL Intravenous Q12H  . vitamin B-12  500 mcg Oral Daily   Continuous Infusions: . sodium chloride     PRN Meds: sodium chloride, acetaminophen **OR** acetaminophen, acetaminophen **AND** diphenhydrAMINE, albuterol, HYDROmorphone (DILAUDID) injection, polyethylene glycol, sodium chloride flush  Allergies:    Allergies  Allergen Reactions  . Accupril [Quinapril Hcl] Other (See Comments)    Unknown reaction   . Amlodipine Swelling  . Benadryl [Diphenhydramine Hcl] Other (See Comments)    Unknown reaction   . Biaxin [Clarithromycin] Other (See Comments)    Unknown reaction   . Ciprofloxacin Nausea And Vomiting  . Codeine Other (See Comments)    Unknown reaction   . Diovan [Valsartan] Other (See Comments)    Unknown reaction   . Medrol [Methylprednisolone] Other (See Comments)    Unknown  . Morphine And Related Other (See Comments)    Unknown reaction   . Neurontin [Gabapentin] Other (See Comments)    Unknown reaction   . Penicillins     Unknown reaction  Did it involve swelling of the face/tongue/throat, SOB, or low BP? Unknown Did it involve sudden or severe rash/hives, skin peeling, or any  reaction on the inside of your mouth or nose? Unknown Did you need to seek medical attention at a hospital or doctor's office? Unknown When did it last happen?unknown If all above answers are "NO", may proceed with cephalosporin use.      Social History:   Social History   Socioeconomic History  . Marital status: Widowed    Spouse name: Not on file  . Number of children: 1  . Years of education: 28  . Highest education level: High school graduate  Occupational History  . 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 Smoking status: Never Smoker  . Smokeless tobacco: Never Used  Substance and Sexual Activity  . Alcohol use: No  . Drug use: No  . Sexual activity: Not Currently  Other Topics Concern  . Not on file  Social History Narrative   Lives with son, Algis Greenhouse   Caffeine use: 1 cup coffee per day   Right handed   Exercises at home with theraband, stationary bike and walking   Social Determinants of Health   Financial Resource Strain:   . Difficulty of Paying Living Expenses:   Food Insecurity:   . Worried About Charity fundraiser in the Last Year:   . Arboriculturist in the Last Year:   Transportation Needs:   . Film/video editor (Medical):   Marland Kitchen Lack of Transportation (Non-Medical):   Physical Activity:   . Days of Exercise per Week:   . Minutes of Exercise per Session:   Stress:   . Feeling of Stress :   Social Connections:   . Frequency of Communication with Friends and Family:   . Frequency of Social Gatherings with Friends and Family:   . Attends Religious Services:   . Active Member of Clubs or Organizations:   . Attends Archivist Meetings:   Marland Kitchen Marital Status:   Intimate Partner Violence:   . Fear of Current or Ex-Partner:   . Emotionally Abused:   Marland Kitchen Physically Abused:   . Sexually Abused:     Family History:   Family History  Problem  Relation Age of Onset  . Heart attack Mother   . Heart attack  Father   . Heart attack Brother   . Hyperlipidemia Sister   . Hypertension Sister      ROS:  Please see the history of present illness.  Review of Systems  Unable to perform ROS: Other (drowsy due to pain medications)      Physical Exam/Data:   Vitals:   06/18/19 1945 06/18/19 2031 06/19/19 0452 06/19/19 0500  BP: (!) 130/53 (!) 142/63 (!) 128/42   Pulse: (!) 59 (!) 59 (!) 59   Resp: 11 16    Temp:  98 F (36.7 C) 98.5 F (36.9 C)   TempSrc:  Oral Oral   SpO2: 97% 100% 90%   Weight:  38.7 kg  38.6 kg  Height:  5\' 3"  (1.6 m)      Intake/Output Summary (Last 24 hours) at 06/19/2019 4970 Last data filed at 06/18/2019 2038 Gross per 24 hour  Intake 370 ml  Output --  Net 370 ml   Last 3 Weights 06/19/2019 06/18/2019 06/18/2019  Weight (lbs) 85 lb 1.6 oz 85 lb 6.4 oz 85 lb  Weight (kg) 38.6 kg 38.737 kg 38.556 kg     Body mass index is 15.07 kg/m.  General: Elderly frail female in no acute distress. HEENT: Normocephalic and atraumatic.  Neck: Supple. No JVD. Heart: RRR. Distinct S1 and S2. No murmurs, gallops, or rubs. Radial pulses 2+ and equal bilaterally. Lungs: No increased work of breathing. Difficult to obtain lung exam due to difficulty repositioning patient due to significant hip pain from fracture. Clear to auscultation anteriorly with decreased breath sounds toward bases. Abdomen: Soft, non-distended, and non-tender to palpation. Bowel sounds present. Extremities: No lower extremity edema edema. Significant pain with any movement of right leg. Skin: Warm and dry. Scattered bruising. Neuro: Alert but drowsy. Would not answer orientation question for me. Psych: Normal affect. Responds appropriately.   EKG:  The EKG was personally reviewed and demonstrates:  V-paced rhythm. Rate 60 bpm.   Telemetry:  Telemetry was personally reviewed and demonstrates: AV paced with rates in the 60's.  Relevant CV Studies:  Echocardiogram 05/30/2019: Impressions: 1. There is  profound pacing-induced dyssynchrony.. Left ventricular  ejection fraction, by estimation, is 45 to 50%. The left ventricle has  mildly decreased function. The left ventricle has no regional wall motion  abnormalities. There is mild concentric  left ventricular hypertrophy. Left ventricular diastolic parameters are  consistent with Grade II diastolic dysfunction (pseudonormalization).  Elevated left atrial pressure.  2. Large pleural effusion in the left lateral region.  3. The mitral valve is normal in structure. Moderate mitral valve  regurgitation.  4. Tricuspid valve regurgitation is moderate to severe.  5. Medtronic Freestyle root replacement 1999. The aortic valve has been  repaired/replaced. Aortic valve regurgitation is mild. Echo findings are  consistent with normal structure and function of the aortic valve  prosthesis.  6. There is borderline dilatation of the aortic root measuring 40 mm.  7. There is moderately elevated pulmonary artery systolic pressure.  8. The inferior vena cava is normal in size with <50% respiratory  variability, suggesting right atrial pressure of 8 mmHg.   Comparison(s): No significant change from prior study. Prior images  reviewed side by side. LVEF was also 45-50% on the previous echo. _______________  Pacemaker Check 06/02/2019: Not pacemaker dependent. Battery status is good. Lead measurements are stable. Heart rate histogram is favorable. No clinically significant episodes of high  ventricular rate or atrial mode switch noted. No atrial flutter or fibrillation since overdrive pacing one month ago.  Laboratory Data:  High Sensitivity Troponin:  No results for input(s): TROPONINIHS in the last 720 hours.   Chemistry Recent Labs  Lab 06/18/19 1048 06/19/19 0508  NA 141 143  K 4.4 4.7  CL 104 103  CO2 27 28  GLUCOSE 129* 119*  BUN 45* 48*  CREATININE 2.01* 2.01*  CALCIUM 8.7* 8.5*  GFRNONAA 21* 21*  GFRAA 24* 24*  ANIONGAP  10 12    Recent Labs  Lab 06/18/19 1048 06/19/19 0508  PROT 6.5 6.4*  ALBUMIN 3.3* 3.2*  AST 23 20  ALT 20 22  ALKPHOS 102 102  BILITOT 0.9 1.2   Hematology Recent Labs  Lab 06/18/19 1048 06/19/19 0508  WBC 7.4 10.4  RBC 3.15* 3.10*  HGB 10.4* 10.3*  HCT 33.1* 32.9*  MCV 105.1* 106.1*  MCH 33.0 33.2  MCHC 31.4 31.3  RDW 17.2* 17.2*  PLT 221 208   BNP Recent Labs  Lab 06/18/19 1048  BNP 941.2*    DDimer No results for input(s): DDIMER in the last 168 hours.   Radiology/Studies:  CT Head Wo Contrast  Result Date: 06/18/2019 CLINICAL DATA:  Poly trauma, critical, head/cervical spine injury suspected. Ataxia, head trauma. Additional history provided: Patient has fallen multiple times in the past week. EXAM: CT HEAD WITHOUT CONTRAST CT CERVICAL SPINE WITHOUT CONTRAST TECHNIQUE: Multidetector CT imaging of the head and cervical spine was performed following the standard protocol without intravenous contrast. Multiplanar CT image reconstructions of the cervical spine were also generated. COMPARISON:  Head CT 06/15/2019, report from CT cervical spine 07/17/2011 (images unavailable) FINDINGS: CT HEAD FINDINGS Brain: The examination is mildly motion degraded. Redemonstrated small focus of cortical/subcortical encephalomalacia within the right frontal lobe which may reflect a remote infarct. Background mild ill-defined hypoattenuation within the cerebral white matter is nonspecific, but consistent with chronic small vessel ischemic disease. Stable, moderate generalized parenchymal atrophy. There is no acute intracranial hemorrhage. No extra-axial fluid collection. No evidence of intracranial mass. No midline shift. Vascular: No hyperdense vessel.  Atherosclerotic calcifications. Skull: Normal. Negative for fracture or focal lesion. Sinuses/Orbits: Visualized orbits show no acute finding. No significant paranasal sinus disease or mastoid effusion at the imaged levels. CT CERVICAL SPINE  FINDINGS Alignment: C3-C4 and C4-C5 grade 1 anterolisthesis. Skull base and vertebrae: The basion-dental and atlanto-dental intervals are maintained.No evidence of acute fracture to the cervical spine. Congenital nonunion of the posterior arch of C1. Soft tissues and spinal canal: No prevertebral fluid or swelling. No visible canal hematoma. Disc levels: Cervical spondylosis. Most notably at C5-C6 there is moderate/advanced disc height loss with posterior disc osteophyte complexes and uncovertebral hypertrophy. Upper chest: Separately reported. There is a small right apical pneumothorax. Partially imaged left pleural effusion. Other: Subcentimeter thyroid nodules not meeting consensus criteria for ultrasound follow-up. Findings of right apical pneumothorax called by telephone at the time of interpretation on 06/18/2019 at 3:30 pm to provider PA [place], who verbally acknowledged these results. IMPRESSION: CT head: 1. Mildly motion degraded examination. 2. No evidence of acute intracranial abnormality. 3. Redemonstrated small focus of encephalomalacia within the right frontal lobe which may reflect a remote infarct. 4. Stable generalized parenchymal atrophy and chronic small vessel ischemic disease. CT cervical spine: 1. No evidence of acute fracture to the cervical spine. 2. C3-C4 and C4-C5 grade 1 anterolisthesis, possibly degenerative. 3. Cervical spondylosis, greatest at C5-C6 and C6-C7. 4. Right apical pneumothorax.  Please refer to separately reported chest CT for additional intrathoracic findings. Electronically Signed   By: Kellie Simmering DO   On: 06/18/2019 15:32   CT Head Wo Contrast  Result Date: 06/15/2019 CLINICAL DATA:  Fall, head trauma EXAM: CT HEAD WITHOUT CONTRAST TECHNIQUE: Contiguous axial images were obtained from the base of the skull through the vertex without intravenous contrast. COMPARISON:  06/01/2011 FINDINGS: Brain: No evidence of acute infarction, hemorrhage, hydrocephalus, extra-axial  collection or mass lesion/mass effect. Periventricular and deep white matter hypodensity with redemonstrated encephalomalacia of the right frontal lobe. Global volume loss. Vascular: No hyperdense vessel or unexpected calcification. Skull: Normal. Negative for fracture or focal lesion. Sinuses/Orbits: No acute finding. Other: None. IMPRESSION: 1.  No acute intracranial pathology. 2. Advanced small-vessel white matter disease and global volume loss. Unchanged encephalomalacia of the right frontal lobe. Electronically Signed   By: Eddie Candle M.D.   On: 06/15/2019 14:03   CT Chest Wo Contrast  Result Date: 06/18/2019 CLINICAL DATA:  Chest pain and shortness of breath. Bilateral pleural effusions. EXAM: CT CHEST WITHOUT CONTRAST TECHNIQUE: Multidetector CT imaging of the chest was performed following the standard protocol without IV contrast. COMPARISON:  Chest x-rays dated 06/18/2019 and 06/10/2019 and chest CT dated 06/15/2019 FINDINGS: Cardiovascular: Chronic cardiomegaly. Previous aortic root repair. Unchanged dilatation of the aortic arch to a diameter of 4 cm, stable. No pericardial effusion. Pacemaker in place. CABG. Mediastinum/Nodes: Multinodular goiter, unchanged. Trachea is normal. Esophagus appears normal. No appreciable hilar or mediastinal or axillary adenopathy. Lungs/Pleura: Persistent large left effusion and small right effusion, essentially unchanged. Minimal compressive atelectasis at the lung bases, stable. No acute infiltrates. Stable nodule in the right middle lobe, 5 x 7 mm. Upper Abdomen: No acute abnormality. Musculoskeletal: Stable mild compression fracture of T3, unchanged. Marked chronic accentuation of the thoracic kyphosis. No acute bone abnormality. IMPRESSION: 1. Stable large left effusion and small right effusion. 2. Stable cardiomegaly. 3. Stable dilatation of the aortic arch to a diameter of 4 cm. 4. Stable right middle lobe nodule. 5. Stable mild compression fracture of T3. 6.  Marked chronic accentuation of the thoracic kyphosis. 7. Aortic atherosclerosis. Aortic Atherosclerosis (ICD10-I70.0). Electronically Signed   By: Lorriane Shire M.D.   On: 06/18/2019 15:32   CT Chest Wo Contrast  Result Date: 06/15/2019 CLINICAL DATA:  Post trauma with left anterior chest wall tenderness after fall last night. EXAM: CT CHEST WITHOUT CONTRAST TECHNIQUE: Multidetector CT imaging of the chest was performed following the standard protocol without IV contrast. COMPARISON:  None. FINDINGS: Cardiovascular: Prior median sternotomy. Patient is moderately kyphotic mild cardiomegaly. Calcified plaque over the coronary arteries. Mild aneurysmal dilatation of the proximal aspect of the aortic arch measuring 4.1 cm in AP diameter. Descending thoracic aorta is normal in caliber. Evidence of previous aortic root repair. Moderate calcified plaque throughout the thoracic aorta. Common takeoff of the right brachiocephalic and left common carotid arteries. Remaining vascular structures are unremarkable. Mediastinum/Nodes: 1.2 cm precarinal lymph node likely reactive. No other significant mediastinal or hilar adenopathy. Remaining mediastinal structures are unremarkable. Lungs/Pleura: Lungs are adequately inflated demonstrate a moderate size left effusion and small right effusion with associated dependent atelectasis. There is an oval 8 x 5 mm nodule over the right middle lobe. Airways are unremarkable. Upper Abdomen: Prior cholecystectomy. Calcified plaque over the abdominal aorta. Musculoskeletal: No definite rib fracture. Moderate kyphosis of the thoracic spine with degenerative changes throughout the spine. Mild compression fracture of T3 age indeterminate. IMPRESSION: 1.  No acute  fracture. 2. Moderate left effusion and small right effusion with associated dependent atelectasis. 3. Postsurgical change compatible previous aortic root repair. Mild aneurysmal dilatation of the proximal arch measuring 4.1 cm in  diameter. Recommend annual imaging followup by CTA or MRA. This recommendation follows 2010 ACCF/AHA/AATS/ACR/ASA/SCA/SCAI/SIR/STS/SVM Guidelines for the Diagnosis and Management of Patients with Thoracic Aortic Disease. Circulation. 2010; 121: K812-X517. Aortic aneurysm NOS (ICD10-I71.9). 4. Aortic Atherosclerosis (ICD10-I70.0). Aortic aneurysm NOS (ICD10-I71.9). Atherosclerotic coronary artery disease. 5. 6.5 mm nodule over the right middle lobe. 1.2 cm precarinal lymph node likely reactive. Non-contrast chest CT at 6-12 months is recommended. If the nodule is stable at time of repeat CT, then future CT at 18-24 months (from today's scan) is considered optional for low-risk patients, but is recommended for high-risk patients. This recommendation follows the consensus statement: Guidelines for Management of Incidental Pulmonary Nodules Detected on CT Images: From the Fleischner Society 2017; Radiology 2017; 284:228-243. 6. Mild T3 compression fracture age indeterminate. Moderate kyphosis of the thoracic spine. Electronically Signed   By: Marin Olp M.D.   On: 06/15/2019 14:22   CT Cervical Spine Wo Contrast  Result Date: 06/18/2019 CLINICAL DATA:  Poly trauma, critical, head/cervical spine injury suspected. Ataxia, head trauma. Additional history provided: Patient has fallen multiple times in the past week. EXAM: CT HEAD WITHOUT CONTRAST CT CERVICAL SPINE WITHOUT CONTRAST TECHNIQUE: Multidetector CT imaging of the head and cervical spine was performed following the standard protocol without intravenous contrast. Multiplanar CT image reconstructions of the cervical spine were also generated. COMPARISON:  Head CT 06/15/2019, report from CT cervical spine 07/17/2011 (images unavailable) FINDINGS: CT HEAD FINDINGS Brain: The examination is mildly motion degraded. Redemonstrated small focus of cortical/subcortical encephalomalacia within the right frontal lobe which may reflect a remote infarct. Background mild  ill-defined hypoattenuation within the cerebral white matter is nonspecific, but consistent with chronic small vessel ischemic disease. Stable, moderate generalized parenchymal atrophy. There is no acute intracranial hemorrhage. No extra-axial fluid collection. No evidence of intracranial mass. No midline shift. Vascular: No hyperdense vessel.  Atherosclerotic calcifications. Skull: Normal. Negative for fracture or focal lesion. Sinuses/Orbits: Visualized orbits show no acute finding. No significant paranasal sinus disease or mastoid effusion at the imaged levels. CT CERVICAL SPINE FINDINGS Alignment: C3-C4 and C4-C5 grade 1 anterolisthesis. Skull base and vertebrae: The basion-dental and atlanto-dental intervals are maintained.No evidence of acute fracture to the cervical spine. Congenital nonunion of the posterior arch of C1. Soft tissues and spinal canal: No prevertebral fluid or swelling. No visible canal hematoma. Disc levels: Cervical spondylosis. Most notably at C5-C6 there is moderate/advanced disc height loss with posterior disc osteophyte complexes and uncovertebral hypertrophy. Upper chest: Separately reported. There is a small right apical pneumothorax. Partially imaged left pleural effusion. Other: Subcentimeter thyroid nodules not meeting consensus criteria for ultrasound follow-up. Findings of right apical pneumothorax called by telephone at the time of interpretation on 06/18/2019 at 3:30 pm to provider PA W[place], who verbally acknowledged these results. IMPRESSION: CT head: 1. Mildly motion degraded examination. 2. No evidence of acute intracranial abnormality. 3. Redemonstrated small focus of encephalomalacia within the right frontal lobe which may reflect a remote infarct. 4. Stable generalized parenchymal atrophy and chronic small vessel ischemic disease. CT cervical spine: 1. No evidence of acute fracture to the cervical spine. 2. C3-C4 and C4-C5 grade 1 anterolisthesis, possibly degenerative. 3.  Cervical spondylosis, greatest at C5-C6 and C6-C7. 4. Right apical pneumothorax. Please refer to separately reported chest CT for additional intrathoracic findings. Electronically Signed  By: Kellie Simmering DO   On: 06/18/2019 15:32   CT Hip Right Wo Contrast  Result Date: 06/18/2019 CLINICAL DATA:  Severe right hip pain. Multiple recent falls. Negative radiographs on 06/18/2019 EXAM: CT OF THE RIGHT HIP WITHOUT CONTRAST TECHNIQUE: Multidetector CT imaging of the right hip was performed according to the standard protocol. Multiplanar CT image reconstructions were also generated. COMPARISON:  Radiographs dated 06/18/2019 and CT scan of the abdomen dated 10/17/2016 FINDINGS: Bones/Joint/Cartilage There is a slightly angulated fracture of the intertrochanteric region of the proximal right femur. Moderate osteoarthritis of the right hip joint. No dislocation. The visualized pelvic bones are intact. Muscles and Tendons Negative. Soft tissues Slight subcutaneous edema and a tiny subcutaneous hematoma lateral to the right greater trochanter. This is consistent with soft tissue contusion. IMPRESSION: 1. Slightly angulated fracture of the intertrochanteric region of the proximal right femur. 2. Moderate osteoarthritis of the right hip joint. 3. Soft tissue contusion lateral to the right greater trochanter. Electronically Signed   By: Lorriane Shire M.D.   On: 06/18/2019 15:24   DG Chest Port 1 View  Result Date: 06/18/2019 CLINICAL DATA:  Shortness of breath and chest pain EXAM: PORTABLE CHEST 1 VIEW COMPARISON:  June 10, 2019 chest radiograph and chest CT Jun 15, 2019 FINDINGS: There are persistent pleural effusions bilaterally, larger on the left than on the right, stable. There is compressive atelectasis in the left lower lobe with questionable superimposed pneumonia, stable. No new opacity evident. Heart is upper normal in size with pacemaker leads attached to right atrium and right ventricle. No adenopathy. Status  post internal mammary bypass grafting. There is aortic atherosclerosis. Postoperative changes noted in the proximal left humerus. Bones are osteoporotic. IMPRESSION: Pleural effusions bilaterally, larger on the left than on the right, stable. Probable compressive atelectasis with questionable superimposed pneumonia left base, stable. No new opacity evident. Stable cardiac silhouette. Pacemaker leads attached to right atrium and right ventricle. Aortic Atherosclerosis (ICD10-I70.0).  Bones osteoporotic. Electronically Signed   By: Lowella Grip III M.D.   On: 06/18/2019 11:27   DG HIP UNILAT WITH PELVIS 2-3 VIEWS LEFT  Result Date: 06/15/2019 CLINICAL DATA:  Left knee pain, fall EXAM: DG HIP (WITH OR WITHOUT PELVIS) 2-3V LEFT COMPARISON:  None. FINDINGS: There is no acute fracture or dislocation. Degenerative changes are present. Vascular calcification is noted. IMPRESSION: No acute fracture. Electronically Signed   By: Macy Mis M.D.   On: 06/15/2019 13:20   DG HIP UNILAT WITH PELVIS 2-3 VIEWS RIGHT  Result Date: 06/18/2019 CLINICAL DATA:  Multiple recent falls, moderate to severe RIGHT hip pain. EXAM: DG HIP (WITH OR WITHOUT PELVIS) 2-3V RIGHT COMPARISON:  Plain film of the pelvis and LEFT hip dated 06/15/2019. Plain film of the pelvis dated 08/14/2017. FINDINGS: Osseous alignment is normal. Diffuse osteopenia limits characterization of osseous detail, however, there is no fracture line or displaced fracture fragment identified. Mild degenerative change at each hip. Atherosclerotic vascular calcifications within the bilateral upper thighs. Soft tissues about the pelvis and RIGHT hip are otherwise unremarkable IMPRESSION: No acute findings. No osseous fracture or dislocation. Osteopenia. Electronically Signed   By: Franki Cabot M.D.   On: 06/18/2019 11:30    Assessment and Plan:   Pre-Op Evaluation - Patient presented after a fall and found to have right intertrochanteric hip fracture.  Cardiology consulted for pre-op evaluation.  - Falls sounds mechanical in nature. She seems stable overall from a cardiac standpoint. She has had progressive dyspnea on exertion  over the past year but patient is very frail. No evidence of volume overload on exam. She does have a large left pleural effusion for which PCCM has recommended conservative management for now.  - Per Revised Cardiac Risk Index, considerate moderate risk. However, this does not take into patient's advanced age or frailty. Suspect more moderate-to-high risk. However, patient is in significant pain with hip and I feel she needs this fixed. Discussed this with son who understands patient's risk and agrees. Do not suspect any additional cardiac work-up prior to surgery. MD to follow with any additional recommendations.  Tiny Pneumothorax/ Large Left Pleural Effusion - PCCM following.  - Tiny right pneumothorax felt to be post-traumatic. Not identified on repeat chest x-ray this morning.  - Chronic bilateral pleural effusion (left > right) felt to likely be due to chronic CHF but have been present for months to years. PCCM recommend continuing to monitor them for now.  CAD s/p CABG  - Patient had coronary reimplantation with LIMA to LAD at time of aortic valve/root surgery.  - Patient initially complained of a little chest discomfort but could not elaborate. She then said she just hurts all over. Do not suspect angina/ACS.  Chronic Combined CHF - Recent Echo on 05/30/2019 showed LVEF of 45-50% with mild LVH, grade 2 diastolic dysfunction, moderate MR, moderate to severe TR, moderately elevated PASP. The aortic valve prosthesis is functioning well with only mild regurgitation and mild dilation of the aortic root. - BNP elevated at 941 but this is down from 1700's in 01/2019. - Does not appear volume overloaded on exam but does have large left pleural effusion.  - Will need to monitor volume status closely in peri-operative period.  May need IV Lasix after surgery.  Persistent Atrial Fibrillation/Flutter - Per most recent device check on 06/02/2019, maintaining atrial paced, ventricular paced rhythm with no evidence of atrial fibrillation or flutter over the prior month.  - Currently AV paced on telemetry. - Continue Coreg 3.125mg  twice daily.  - Continue PO Amiodarone 200mg  daily. - Patient previously on Eliquis 2.5mg  twice daily but this was discontinued at office visit last month due to spontaneous bruising and history of falls.   Bicuspid Aortic Valve with Severe Stenosis s/p Ascending Aorta Repair and AVR ([employer] Freestyle) - Most recent Echo showed normal gradient across prosthetic valve with only mild aortic insufficiency.   Sick Sinus Syndrome/2nd Degree AV Block s/p PPM  - Per Holly Bolton December last office visit note, she had normal device function with 100% atrial paced and ventricular paced rhythm. He did not think we could avoid the high frequency of RV pacing at that time as this happed when she was in atrial fibrillation and is now felt to be occurring due to Amiodarone therapy. She was not felt to be a good CRT upgrade candidate due to her frailty.   Hypertension - BP well controlled. - Continue current medications.  Hyperlipidemia - Prefers not to be on a statin. Given advanced age, will not push this.  Hypothyroidism - Management per primary team.  Acute on Chronic Kidney Disease Stage IV - Creatinine 2.01 today. Baseline around 1.4 to 1.7. - Avoid nephrotoxic agents. - Continue to monitor closely.  Hip Fracture - Management per Ortho. Planning surgical fixation.   For questions or updates, please contact [place] Please consult [email or web address] for contact info under     Signed, Darreld Mclean, PA-C  06/19/2019 7:12 AM

## 2019-06-19 NOTE — Anesthesia Preprocedure Evaluation (Addendum)
Anesthesia Evaluation  Patient identified by MRN, date of birth, ID band  Reviewed: Allergy & Precautions, NPO status , Patient's Chart, lab work & pertinent test results  History of Anesthesia Complications Negative for: history of anesthetic complications  Airway Mallampati: II  TM Distance: >3 FB Neck ROM: Full    Dental  (+) Teeth Intact   Pulmonary asthma ,  B/l chronic pleural effusions Very small traumatic pneumo for which conservative therapy was recommended   Pulmonary exam normal        Cardiovascular hypertension, + CAD, + CABG, + Peripheral Vascular Disease and +CHF  Normal cardiovascular exam+ dysrhythmias Atrial Fibrillation + pacemaker + Valvular Problems/Murmurs (s/p AVR)      Neuro/Psych negative neurological ROS  negative psych ROS   GI/Hepatic negative GI ROS, Neg liver ROS,   Endo/Other  Hypothyroidism   Renal/GU ARF and CRFRenal disease  negative genitourinary   Musculoskeletal negative musculoskeletal ROS (+)   Abdominal   Peds  Hematology  (+) anemia ,   Anesthesia Other Findings  Evaluated by pulmonology and cardiology prior to surgery and no further workup or intervention required. Appears stable from cardiac standpoint.  Reproductive/Obstetrics                           Anesthesia Physical Anesthesia Plan  ASA: IV and emergent  Anesthesia Plan: Spinal   Post-op Pain Management:    Induction:   PONV Risk Score and Plan: 2 and Propofol infusion, Treatment may vary due to age or medical condition, Ondansetron and TIVA  Airway Management Planned: Nasal Cannula and Simple Face Mask  Additional Equipment: None  Intra-op Plan:   Post-operative Plan:   Informed Consent: I have reviewed the patients History and Physical, chart, labs and discussed the procedure including the risks, benefits and alternatives for the proposed anesthesia with the patient or  authorized representative who has indicated his/her understanding and acceptance.     Consent reviewed with POA  Plan Discussed with:   Anesthesia Plan Comments:        Anesthesia Quick Evaluation

## 2019-06-19 NOTE — Progress Notes (Signed)
PROGRESS NOTE    Holly Bolton  ERX:[id number] DOB: 1922-08-16 DOA: 06/18/2019 PCP: Gayland Curry, DO   Brief Narrative:  86 year old with history of atrial flutter/fibrillation on amiodarone not on anticoagulation, chronic systolic CHF, CAD status post CABG, status post AVR, PAD, aortic aneurysm repair, pacemaker for sick sinus syndrome, second-degree AV block, hypothyroidism, CKD stage IV admitted after a fall and right hip pain.  Trauma work-up including CT head and CT cervical spine negative.  Showed right apical pneumothorax.  CT chest showed large left-sided and small right-sided pleural effusion, mild T3 compression fracture.  CT of the right hip showed right femur fracture.  Orthopedic consulted.   Assessment & Plan:   Principal Problem:   Closed intertrochanteric fracture of right hip (HCC) Active Problems:   CAD s/p CABG (LIMA to LAD)   History of aortic root repairand bioprosthetic AVR   Hyperlipidemia   Pacemaker, dual chamber [employer] 2003/2010/2019   Essential hypertension   Persistent atrial fibrillation (HCC)   Chronic systolic CHF (congestive heart failure) (HCC)   Acute kidney injury superimposed on CKD ([place])   Adult failure to thrive  Mechanical fall leading to closed right intertrochanteric fracture -Surgical plans per orthopedic-IM nailing of the right hip. -Postop management DVT prophylaxis, pain control by orthopedic -Postop PT/OT -We will need cardiac clearance given extensive history.  Cardiology consulte-appreciate their input  Acute hypoxic respiratory failure requiring 2-3 L nasal cannula -Combination of pleural effusion, atelectasis and some pneumothorax.  Supplemental oxygen.  Incentive spirometer.  Tiny right apical pneumothorax -Conservative management per pulmonary.  Repeat chest x-ray this morning, barely noticeable to me, will await final read  Chronic bilateral left greater than right pleural effusion -Continue to monitor this for  now.  Chronic congestive heart failure with reduced ejection fraction -Echo 4/16-EF 45-50%, grade 2 DD  CAD/PAD -Denies any chest pain at the moment.  Acute on chronic kidney disease stage IV -Baseline creatinine 1.5, today it is 2.0.  Anemia of chronic disease -Monitor CBC  History of atrial fibrillation/persistent atrial fibrillation -Continue amiodarone.  Not on anticoagulation due to falls risk  Moderate to severe protein calorie malnutrition Adult failure to thrive -Encourage p.o. intake.  Palliative care consulted by admitting provider.  Need to address her CODE STATUS appropriately   DVT prophylaxis: SCDs preoperatively Code Status: DNR per son at bedside. Family Communication: Son at bedside  Status is: Inpatient  Remains inpatient appropriate because:Unsafe d/c plan   Dispo: The patient is from: Home              Anticipated d/c is to: SNF              Anticipated d/c date is: > 3 days              Patient currently is not medically stable to d/c.  Patient needs surgical management of the left hip but given extensive cardiac history need to stabilize her from cardiac standpoint and respiratory standpoint.  She does have a small apical pneumothorax and bilateral effusions.  Subjective: Patient is pleasantly confused reporting of hip pain as expected during my evaluation in the morning.  Son at bedside.  After extensive discussion with the patient, they would not like patient to be full code-at this time they agree with DNR/DNI.  Cardiologist in the room towards the end of my discussion.  Review of Systems Otherwise negative except as per HPI, including: General: Denies fever, chills, night sweats or unintended weight loss. Resp: Denies  cough, wheezing, shortness of breath. Cardiac: Denies chest pain, palpitations, orthopnea, paroxysmal nocturnal dyspnea. GI: Denies abdominal pain, nausea, vomiting, diarrhea or constipation GU: Denies dysuria, frequency, hesitancy  or incontinence MS: Denies muscle aches, joint pain or swelling Neuro: Denies headache, neurologic deficits (focal weakness, numbness, tingling), abnormal gait Psych: Denies anxiety, depression, SI/HI/AVH Skin: Denies new rashes or lesions ID: Denies sick contacts, exotic exposures, travel  Examination:  Constitutional: Not in acute distress ecchymosis around her eye noted.  Elderly cachectic frail with bilateral temporal wasting Respiratory: Bibasilar rhonchi Cardiovascular: Normal sinus rhythm, no rubs Abdomen: Nontender nondistended good bowel sounds Musculoskeletal: No edema noted Skin: No rashes seen Neurologic: CN 2-12 grossly intact.  And nonfocal Psychiatric: Poor judgment insight.  Alert to name only.  Objective: Vitals:   06/18/19 1945 06/18/19 2031 06/19/19 0452 06/19/19 0500  BP: (!) 130/53 (!) 142/63 (!) 128/42   Pulse: (!) 59 (!) 59 (!) 59   Resp: 11 16    Temp:  98 F (36.7 C) 98.5 F (36.9 C)   TempSrc:  Oral Oral   SpO2: 97% 100% 90%   Weight:  38.7 kg  38.6 kg  Height:  5\' 3"  (1.6 m)      Intake/Output Summary (Last 24 hours) at 06/19/2019 0745 Last data filed at 06/18/2019 2038 Gross per 24 hour  Intake 370 ml  Output --  Net 370 ml   Filed Weights   06/18/19 1041 06/18/19 2031 06/19/19 0500  Weight: 38.6 kg 38.7 kg 38.6 kg     Data Reviewed:   CBC: Recent Labs  Lab 06/18/19 1048 06/19/19 0508  WBC 7.4 10.4  NEUTROABS 5.4  --   HGB 10.4* 10.3*  HCT 33.1* 32.9*  MCV 105.1* 106.1*  PLT 221 440   Basic Metabolic Panel: Recent Labs  Lab 06/18/19 1048 06/19/19 0508  NA 141 143  K 4.4 4.7  CL 104 103  CO2 27 28  GLUCOSE 129* 119*  BUN 45* 48*  CREATININE 2.01* 2.01*  CALCIUM 8.7* 8.5*   GFR: Estimated Creatinine Clearance: 10.4 mL/min (A) (by C-G formula based on SCr of 2.01 mg/dL (H)). Liver Function Tests: Recent Labs  Lab 06/18/19 1048 06/19/19 0508  AST 23 20  ALT 20 22  ALKPHOS 102 102  BILITOT 0.9 1.2  PROT 6.5 6.4*   ALBUMIN 3.3* 3.2*   No results for input(s): LIPASE, AMYLASE in the last 168 hours. No results for input(s): AMMONIA in the last 168 hours. Coagulation Profile: No results for input(s): INR, PROTIME in the last 168 hours. Cardiac Enzymes: No results for input(s): CKTOTAL, CKMB, CKMBINDEX, TROPONINI in the last 168 hours. BNP (last 3 results) No results for input(s): PROBNP in the last 8760 hours. HbA1C: No results for input(s): HGBA1C in the last 72 hours. CBG: No results for input(s): GLUCAP in the last 168 hours. Lipid Profile: No results for input(s): CHOL, HDL, LDLCALC, TRIG, CHOLHDL, LDLDIRECT in the last 72 hours. Thyroid Function Tests: No results for input(s): TSH, T4TOTAL, FREET4, T3FREE, THYROIDAB in the last 72 hours. Anemia Panel: No results for input(s): VITAMINB12, FOLATE, FERRITIN, TIBC, IRON, RETICCTPCT in the last 72 hours. Sepsis Labs: No results for input(s): PROCALCITON, LATICACIDVEN in the last 168 hours.  Recent Results (from the past 240 hour(s))  Respiratory Panel by RT PCR (Flu A&B, Covid) - Nasopharyngeal Swab     Status: None   Collection Time: 06/18/19  3:21 PM   Specimen: Nasopharyngeal Swab  Result Value Ref Range Status  SARS Coronavirus 2 by RT PCR NEGATIVE NEGATIVE Final    Comment: (NOTE) SARS-CoV-2 target nucleic acids are NOT DETECTED. The SARS-CoV-2 RNA is generally detectable in upper respiratoy specimens during the acute phase of infection. The lowest concentration of SARS-CoV-2 viral copies this assay can detect is 131 copies/mL. A negative result does not preclude SARS-Cov-2 infection and should not be used as the sole basis for treatment or other patient management decisions. A negative result may occur with  improper specimen collection/handling, submission of specimen other than nasopharyngeal swab, presence of viral mutation(s) within the areas targeted by this assay, and inadequate number of viral copies (<131 copies/mL). A  negative result must be combined with clinical observations, patient history, and epidemiological information. The expected result is Negative. Fact Sheet for Patients:  PinkCheek.be Fact Sheet for Healthcare Providers:  GravelBags.it This test is not yet ap proved or cleared by the [place] FDA and  has been authorized for detection and/or diagnosis of SARS-CoV-2 by FDA under an Emergency Use Authorization (EUA). This EUA will remain  in effect (meaning this test can be used) for the duration of the COVID-19 declaration under Section 564(b)(1) of the Act, 21 U.S.C. section 360bbb-3(b)(1), unless the authorization is terminated or revoked sooner.    Influenza A by PCR NEGATIVE NEGATIVE Final   Influenza B by PCR NEGATIVE NEGATIVE Final    Comment: (NOTE) The Xpert Xpress SARS-CoV-2/FLU/RSV assay is intended as an aid in  the diagnosis of influenza from Nasopharyngeal swab specimens and  should not be used as a sole basis for treatment. Nasal washings and  aspirates are unacceptable for Xpert Xpress SARS-CoV-2/FLU/RSV  testing. Fact Sheet for Patients: PinkCheek.be Fact Sheet for Healthcare Providers: GravelBags.it This test is not yet approved or cleared by the [place] FDA and  has been authorized for detection and/or diagnosis of SARS-CoV-2 by  FDA under an Emergency Use Authorization (EUA). This EUA will remain  in effect (meaning this test can be used) for the duration of the  Covid-19 declaration under Section 564(b)(1) of the Act, 21  U.S.C. section 360bbb-3(b)(1), unless the authorization is  terminated or revoked. Performed at [facility], [place] [address], [place], [place] [address]          Radiology Studies: CT Head Wo Contrast  Result Date: 06/18/2019 CLINICAL DATA:  Poly trauma, critical, head/cervical spine injury  suspected. Ataxia, head trauma. Additional history provided: Patient has fallen multiple times in the past week. EXAM: CT HEAD WITHOUT CONTRAST CT CERVICAL SPINE WITHOUT CONTRAST TECHNIQUE: Multidetector CT imaging of the head and cervical spine was performed following the standard protocol without intravenous contrast. Multiplanar CT image reconstructions of the cervical spine were also generated. COMPARISON:  Head CT 06/15/2019, report from CT cervical spine 07/17/2011 (images unavailable) FINDINGS: CT HEAD FINDINGS Brain: The examination is mildly motion degraded. Redemonstrated small focus of cortical/subcortical encephalomalacia within the right frontal lobe which may reflect a remote infarct. Background mild ill-defined hypoattenuation within the cerebral white matter is nonspecific, but consistent with chronic small vessel ischemic disease. Stable, moderate generalized parenchymal atrophy. There is no acute intracranial hemorrhage. No extra-axial fluid collection. No evidence of intracranial mass. No midline shift. Vascular: No hyperdense vessel.  Atherosclerotic calcifications. Skull: Normal. Negative for fracture or focal lesion. Sinuses/Orbits: Visualized orbits show no acute finding. No significant paranasal sinus disease or mastoid effusion at the imaged levels. CT CERVICAL SPINE FINDINGS Alignment: C3-C4 and C4-C5 grade 1 anterolisthesis. Skull base and vertebrae: The basion-dental  and atlanto-dental intervals are maintained.No evidence of acute fracture to the cervical spine. Congenital nonunion of the posterior arch of C1. Soft tissues and spinal canal: No prevertebral fluid or swelling. No visible canal hematoma. Disc levels: Cervical spondylosis. Most notably at C5-C6 there is moderate/advanced disc height loss with posterior disc osteophyte complexes and uncovertebral hypertrophy. Upper chest: Separately reported. There is a small right apical pneumothorax. Partially imaged left pleural effusion.  Other: Subcentimeter thyroid nodules not meeting consensus criteria for ultrasound follow-up. Findings of right apical pneumothorax called by telephone at the time of interpretation on 06/18/2019 at 3:30 pm to provider PA [place], who verbally acknowledged these results. IMPRESSION: CT head: 1. Mildly motion degraded examination. 2. No evidence of acute intracranial abnormality. 3. Redemonstrated small focus of encephalomalacia within the right frontal lobe which may reflect a remote infarct. 4. Stable generalized parenchymal atrophy and chronic small vessel ischemic disease. CT cervical spine: 1. No evidence of acute fracture to the cervical spine. 2. C3-C4 and C4-C5 grade 1 anterolisthesis, possibly degenerative. 3. Cervical spondylosis, greatest at C5-C6 and C6-C7. 4. Right apical pneumothorax. Please refer to separately reported chest CT for additional intrathoracic findings. Electronically Signed   By: Kellie Simmering DO   On: 06/18/2019 15:32   CT Chest Wo Contrast  Result Date: 06/18/2019 CLINICAL DATA:  Chest pain and shortness of breath. Bilateral pleural effusions. EXAM: CT CHEST WITHOUT CONTRAST TECHNIQUE: Multidetector CT imaging of the chest was performed following the standard protocol without IV contrast. COMPARISON:  Chest x-rays dated 06/18/2019 and 06/10/2019 and chest CT dated 06/15/2019 FINDINGS: Cardiovascular: Chronic cardiomegaly. Previous aortic root repair. Unchanged dilatation of the aortic arch to a diameter of 4 cm, stable. No pericardial effusion. Pacemaker in place. CABG. Mediastinum/Nodes: Multinodular goiter, unchanged. Trachea is normal. Esophagus appears normal. No appreciable hilar or mediastinal or axillary adenopathy. Lungs/Pleura: Persistent large left effusion and small right effusion, essentially unchanged. Minimal compressive atelectasis at the lung bases, stable. No acute infiltrates. Stable nodule in the right middle lobe, 5 x 7 mm. Upper Abdomen: No acute abnormality.  Musculoskeletal: Stable mild compression fracture of T3, unchanged. Marked chronic accentuation of the thoracic kyphosis. No acute bone abnormality. IMPRESSION: 1. Stable large left effusion and small right effusion. 2. Stable cardiomegaly. 3. Stable dilatation of the aortic arch to a diameter of 4 cm. 4. Stable right middle lobe nodule. 5. Stable mild compression fracture of T3. 6. Marked chronic accentuation of the thoracic kyphosis. 7. Aortic atherosclerosis. Aortic Atherosclerosis (ICD10-I70.0). Electronically Signed   By: Lorriane Shire M.D.   On: 06/18/2019 15:32   CT Cervical Spine Wo Contrast  Result Date: 06/18/2019 CLINICAL DATA:  Poly trauma, critical, head/cervical spine injury suspected. Ataxia, head trauma. Additional history provided: Patient has fallen multiple times in the past week. EXAM: CT HEAD WITHOUT CONTRAST CT CERVICAL SPINE WITHOUT CONTRAST TECHNIQUE: Multidetector CT imaging of the head and cervical spine was performed following the standard protocol without intravenous contrast. Multiplanar CT image reconstructions of the cervical spine were also generated. COMPARISON:  Head CT 06/15/2019, report from CT cervical spine 07/17/2011 (images unavailable) FINDINGS: CT HEAD FINDINGS Brain: The examination is mildly motion degraded. Redemonstrated small focus of cortical/subcortical encephalomalacia within the right frontal lobe which may reflect a remote infarct. Background mild ill-defined hypoattenuation within the cerebral white matter is nonspecific, but consistent with chronic small vessel ischemic disease. Stable, moderate generalized parenchymal atrophy. There is no acute intracranial hemorrhage. No extra-axial fluid collection. No evidence of intracranial mass. No midline shift.  Vascular: No hyperdense vessel.  Atherosclerotic calcifications. Skull: Normal. Negative for fracture or focal lesion. Sinuses/Orbits: Visualized orbits show no acute finding. No significant paranasal sinus  disease or mastoid effusion at the imaged levels. CT CERVICAL SPINE FINDINGS Alignment: C3-C4 and C4-C5 grade 1 anterolisthesis. Skull base and vertebrae: The basion-dental and atlanto-dental intervals are maintained.No evidence of acute fracture to the cervical spine. Congenital nonunion of the posterior arch of C1. Soft tissues and spinal canal: No prevertebral fluid or swelling. No visible canal hematoma. Disc levels: Cervical spondylosis. Most notably at C5-C6 there is moderate/advanced disc height loss with posterior disc osteophyte complexes and uncovertebral hypertrophy. Upper chest: Separately reported. There is a small right apical pneumothorax. Partially imaged left pleural effusion. Other: Subcentimeter thyroid nodules not meeting consensus criteria for ultrasound follow-up. Findings of right apical pneumothorax called by telephone at the time of interpretation on 06/18/2019 at 3:30 pm to provider PA [place], who verbally acknowledged these results. IMPRESSION: CT head: 1. Mildly motion degraded examination. 2. No evidence of acute intracranial abnormality. 3. Redemonstrated small focus of encephalomalacia within the right frontal lobe which may reflect a remote infarct. 4. Stable generalized parenchymal atrophy and chronic small vessel ischemic disease. CT cervical spine: 1. No evidence of acute fracture to the cervical spine. 2. C3-C4 and C4-C5 grade 1 anterolisthesis, possibly degenerative. 3. Cervical spondylosis, greatest at C5-C6 and C6-C7. 4. Right apical pneumothorax. Please refer to separately reported chest CT for additional intrathoracic findings. Electronically Signed   By: Kellie Simmering DO   On: 06/18/2019 15:32   CT Hip Right Wo Contrast  Result Date: 06/18/2019 CLINICAL DATA:  Severe right hip pain. Multiple recent falls. Negative radiographs on 06/18/2019 EXAM: CT OF THE RIGHT HIP WITHOUT CONTRAST TECHNIQUE: Multidetector CT imaging of the right hip was performed according to the standard  protocol. Multiplanar CT image reconstructions were also generated. COMPARISON:  Radiographs dated 06/18/2019 and CT scan of the abdomen dated 10/17/2016 FINDINGS: Bones/Joint/Cartilage There is a slightly angulated fracture of the intertrochanteric region of the proximal right femur. Moderate osteoarthritis of the right hip joint. No dislocation. The visualized pelvic bones are intact. Muscles and Tendons Negative. Soft tissues Slight subcutaneous edema and a tiny subcutaneous hematoma lateral to the right greater trochanter. This is consistent with soft tissue contusion. IMPRESSION: 1. Slightly angulated fracture of the intertrochanteric region of the proximal right femur. 2. Moderate osteoarthritis of the right hip joint. 3. Soft tissue contusion lateral to the right greater trochanter. Electronically Signed   By: Lorriane Shire M.D.   On: 06/18/2019 15:24   DG Chest Port 1 View  Result Date: 06/18/2019 CLINICAL DATA:  Shortness of breath and chest pain EXAM: PORTABLE CHEST 1 VIEW COMPARISON:  June 10, 2019 chest radiograph and chest CT Jun 15, 2019 FINDINGS: There are persistent pleural effusions bilaterally, larger on the left than on the right, stable. There is compressive atelectasis in the left lower lobe with questionable superimposed pneumonia, stable. No new opacity evident. Heart is upper normal in size with pacemaker leads attached to right atrium and right ventricle. No adenopathy. Status post internal mammary bypass grafting. There is aortic atherosclerosis. Postoperative changes noted in the proximal left humerus. Bones are osteoporotic. IMPRESSION: Pleural effusions bilaterally, larger on the left than on the right, stable. Probable compressive atelectasis with questionable superimposed pneumonia left base, stable. No new opacity evident. Stable cardiac silhouette. Pacemaker leads attached to right atrium and right ventricle. Aortic Atherosclerosis (ICD10-I70.0).  Bones osteoporotic.  Electronically Signed  By: Lowella Grip III M.D.   On: 06/18/2019 11:27   DG HIP UNILAT WITH PELVIS 2-3 VIEWS RIGHT  Result Date: 06/18/2019 CLINICAL DATA:  Multiple recent falls, moderate to severe RIGHT hip pain. EXAM: DG HIP (WITH OR WITHOUT PELVIS) 2-3V RIGHT COMPARISON:  Plain film of the pelvis and LEFT hip dated 06/15/2019. Plain film of the pelvis dated 08/14/2017. FINDINGS: Osseous alignment is normal. Diffuse osteopenia limits characterization of osseous detail, however, there is no fracture line or displaced fracture fragment identified. Mild degenerative change at each hip. Atherosclerotic vascular calcifications within the bilateral upper thighs. Soft tissues about the pelvis and RIGHT hip are otherwise unremarkable IMPRESSION: No acute findings. No osseous fracture or dislocation. Osteopenia. Electronically Signed   By: Franki Cabot M.D.   On: 06/18/2019 11:30        Scheduled Meds: . amiodarone  200 mg Oral Daily  . aspirin  81 mg Oral Daily  . brimonidine  1 drop Both Eyes BID  . carvedilol  3.125 mg Oral BID WC  . cholecalciferol  2,000 Units Oral QHS  . docusate sodium  100 mg Oral BID  . furosemide  20 mg Oral Daily  . sodium chloride flush  3 mL Intravenous Q12H  . sodium chloride flush  3 mL Intravenous Q12H  . vitamin B-12  500 mcg Oral Daily   Continuous Infusions: . sodium chloride       LOS: 1 day   Time spent= 35 mins    Reise Gladney Arsenio Loader, MD [facility]  If 7PM-7AM, please contact night-coverage  06/19/2019, 7:45 AM

## 2019-06-20 DIAGNOSIS — E43 Unspecified severe protein-calorie malnutrition: Secondary | ICD-10-CM

## 2019-06-20 DIAGNOSIS — I10 Essential (primary) hypertension: Secondary | ICD-10-CM | POA: Diagnosis not present

## 2019-06-20 DIAGNOSIS — R7989 Other specified abnormal findings of blood chemistry: Secondary | ICD-10-CM

## 2019-06-20 DIAGNOSIS — N179 Acute kidney failure, unspecified: Secondary | ICD-10-CM

## 2019-06-20 DIAGNOSIS — I5022 Chronic systolic (congestive) heart failure: Secondary | ICD-10-CM | POA: Diagnosis not present

## 2019-06-20 DIAGNOSIS — I4819 Other persistent atrial fibrillation: Secondary | ICD-10-CM | POA: Diagnosis not present

## 2019-06-20 DIAGNOSIS — I2510 Atherosclerotic heart disease of native coronary artery without angina pectoris: Secondary | ICD-10-CM | POA: Diagnosis not present

## 2019-06-20 DIAGNOSIS — W19XXXA Unspecified fall, initial encounter: Secondary | ICD-10-CM

## 2019-06-20 DIAGNOSIS — I5042 Chronic combined systolic (congestive) and diastolic (congestive) heart failure: Secondary | ICD-10-CM

## 2019-06-20 DIAGNOSIS — N189 Chronic kidney disease, unspecified: Secondary | ICD-10-CM

## 2019-06-20 DIAGNOSIS — D631 Anemia in chronic kidney disease: Secondary | ICD-10-CM

## 2019-06-20 DIAGNOSIS — Y92009 Unspecified place in unspecified non-institutional (private) residence as the place of occurrence of the external cause: Secondary | ICD-10-CM

## 2019-06-20 LAB — BASIC METABOLIC PANEL
Anion gap: 10 (ref 5–15)
BUN: 58 mg/dL — ABNORMAL HIGH (ref 8–23)
CO2: 26 mmol/L (ref 22–32)
Calcium: 8.4 mg/dL — ABNORMAL LOW (ref 8.9–10.3)
Chloride: 101 mmol/L (ref 98–111)
Creatinine, Ser: 2.63 mg/dL — ABNORMAL HIGH (ref 0.44–1.00)
GFR calc Af Amer: 17 mL/min — ABNORMAL LOW (ref >60)
GFR calc non Af Amer: 15 mL/min — ABNORMAL LOW (ref >60)
Glucose, Bld: 110 mg/dL — ABNORMAL HIGH (ref 70–99)
Potassium: 4.3 mmol/L (ref 3.5–5.1)
Sodium: 137 mmol/L (ref 135–145)

## 2019-06-20 LAB — CBC
HCT: 29 % — ABNORMAL LOW (ref 36.0–46.0)
Hemoglobin: 9 g/dL — ABNORMAL LOW (ref 12.0–15.0)
MCH: 33.6 pg (ref 26.0–34.0)
MCHC: 31 g/dL (ref 30.0–36.0)
MCV: 108.2 fL — ABNORMAL HIGH (ref 80.0–100.0)
Platelets: 189 10*3/uL (ref 150–400)
RBC: 2.68 MIL/uL — ABNORMAL LOW (ref 3.87–5.11)
RDW: 17.2 % — ABNORMAL HIGH (ref 11.5–15.5)
WBC: 14.1 10*3/uL — ABNORMAL HIGH (ref 4.0–10.5)
nRBC: 0.6 % — ABNORMAL HIGH (ref 0.0–0.2)

## 2019-06-20 LAB — VITAMIN B12: Vitamin B-12: 1217 pg/mL — ABNORMAL HIGH (ref 180–914)

## 2019-06-20 LAB — RETICULOCYTES
Immature Retic Fract: 13.5 % (ref 2.3–15.9)
RBC.: 2.56 MIL/uL — ABNORMAL LOW (ref 3.87–5.11)
Retic Count, Absolute: 42.2 10*3/uL (ref 19.0–186.0)
Retic Ct Pct: 1.7 % (ref 0.4–3.1)

## 2019-06-20 LAB — FOLATE: Folate: 10.1 ng/mL (ref >5.9)

## 2019-06-20 LAB — IRON AND TIBC
Iron: 25 ug/dL — ABNORMAL LOW (ref 28–170)
Saturation Ratios: 12 % (ref 10.4–31.8)
TIBC: 209 ug/dL — ABNORMAL LOW (ref 250–450)
UIBC: 184 ug/dL

## 2019-06-20 LAB — FERRITIN: Ferritin: 535 ng/mL — ABNORMAL HIGH (ref 11–307)

## 2019-06-20 MED ORDER — SODIUM CHLORIDE 0.9 % IV BOLUS: 500.0000 mL | Freq: Once | INTRAVENOUS | Status: DC

## 2019-06-20 MED ORDER — HEPARIN SODIUM (PORCINE) 5000 UNIT/ML IJ SOLN
5000.0000 [IU] | Freq: Three times a day (TID) | INTRAMUSCULAR | Status: DC
  Administered 2019-06-20 – 2019-06-24 (×11): 5000 [IU] via SUBCUTANEOUS
  Filled 2019-06-20 (×11): qty 1

## 2019-06-20 NOTE — Progress Notes (Signed)
PROGRESS NOTE  Holly Bolton PRF:[id number] DOB: 11-09-1922   PCP: Gayland Curry, DO  Patient is from: Home  DOA: 06/18/2019 LOS: 2  Brief Narrative / Interim history: 86 year old with history of atrial flutter/fibrillation on amiodarone not on anticoagulation, chronic systolic CHF, CAD status post CABG, status post AVR, PAD, aortic aneurysm repair, pacemaker for sick sinus syndrome, second-degree AV block, hypothyroidism, CKD stage IV admitted after a fall and right hip pain.  Trauma work-up including CT head and CT cervical spine negative.  Showed right apical pneumothorax.  CT chest showed large left-sided and small right-sided pleural effusion, mild T3 compression fracture.  CT of the right hip showed right femur fracture.  Orthopedic consulted. She underwent intramedullary nailing on 5/6.   Subjective: Seen and examined earlier this morning. No major events overnight of this morning. No complaints but not a great historian. She responds no to pain. She is oriented to self and family.  Objective: Vitals:   06/19/19 1439 06/19/19 2237 06/20/19 0500 06/20/19 0820  BP: (!) 109/46 (!) 106/42 (!) 127/41 (!) 127/45  Pulse: 60 60 60 60  Resp:   16 16  Temp:  97.7 F (36.5 C) 98 F (36.7 C) 98 F (36.7 C)  TempSrc:  Oral Oral Oral  SpO2:  90% 98% 97%  Weight:   40.5 kg   Height:        Intake/Output Summary (Last 24 hours) at 06/20/2019 1249 Last data filed at 06/20/2019 1202 Gross per 24 hour  Intake 1540 ml  Output 30 ml  Net 1510 ml   Filed Weights   06/18/19 2031 06/19/19 0500 06/20/19 0500  Weight: 38.7 kg 38.6 kg 40.5 kg    Examination:  GENERAL: Frail looking elderly female. No apparent distress.  Nontoxic. HEENT: MMM.  Vision and hearing grossly intact.  NECK: Supple.  No apparent JVD.  RESP: On room air. No IWOB.  Fair aeration bilaterally. CVS:  RRR. Heart sounds normal.  ABD/GI/GU: Bowel sounds present. Soft. Non tender.  MSK/EXT:  Moves extremities. No apparent  deformity. No edema.  SKIN: Dressing over surgical wound DCI. NEURO: Awake, alert and oriented self and her son. No apparent focal neuro deficit. PSYCH: Calm. Normal affect.  Procedures:  5/6-intramedullary nailing of right femoral fracture  Microbiology summarized: Influenza PCR negative. COVID-19 PCR negative.  Assessment & Plan: Mechanical fall leading to closed right intertrochanteric fracture -IM nailing of right femoral fracture by orthopedic surgery on 5/6 -PT/OT -VTE PPx and pain control per surgery -Bowel regimen  Acute respiratory failure with hypoxia: Seems to have resolved. She was on room air with nasal cannula and tachypneic this morning -Encourage incentive spirometry -OOB/PT/OT -Minimal oxygen to maintain saturation above 90%.  Small right apical pneumothorax-resolved on subsequent chest x-ray. -Conservative care per pulmonology  Chronic bilateral left greater than right pleural effusion-unclear etiology. No signs of fluid overload. -May need diagnostic+/-therapeutic thoracocentesis although respiratory failure seems to have resolved.  Chronic combined CHF: Echo 4/16-EF 45-50%, grade 2 DD. Appears euvolemic. On p.o. Lasix 20 mg daily at home. BNP 941 but lower than previous values. Now worsening renal function on p.o. Lasix. I&O incomplete. -Discontinue p.o. Lasix -Monitor fluid status and renal function -Liberate fluid intake.  Chronic CAD/PAD: No anginal symptoms. -Continue home meds.  AKI on CKD-4/azotemia: Baseline Cr~1.5>2.01 (admit)> 2.63. BUN 45> 58 -Discontinue Lasix -Continue monitoring  Anemia of renal disease: Baseline Hgb 9-10> 10.3 (admit)> 9.0 -Continue monitoring -Check anemia panel  Persistent atrial fibrillation: Rate controlled. -Continue home  amiodarone -Not on anticoagulation due to fall risk  Debility/physical deconditioning -PT/OT  Severe protein calorie malnutrition/Adult IRW:ERXV mass index is 15.82 kg/m.  Significant muscle mass and subcu fat loss. -Consult dietitian  GOC/DNR/DNI-appropriate. -Palliative care consulted.            DVT prophylaxis: Start subcu heparin Code Status: DNR/DNI Family Communication: Updated patient's son at bedside. Status is: Inpatient  Remains inpatient appropriate because:Unsafe d/c plan and Inpatient level of care appropriate due to severity of illness   Dispo: The patient is from: Home              Anticipated d/c is to: SNF              Anticipated d/c date is: 2 days              Patient currently is not medically stable to d/c.     Consultants:  Orthopedic surgery Palliative care   Sch Meds:  Scheduled Meds: . amiodarone  200 mg Oral Daily  . aspirin  81 mg Oral Daily  . brimonidine  1 drop Both Eyes BID  . carvedilol  3.125 mg Oral BID WC  . cholecalciferol  2,000 Units Oral QHS  . docusate sodium  100 mg Oral BID  . sodium chloride flush  3 mL Intravenous Q12H  . sodium chloride flush  3 mL Intravenous Q12H  . vitamin B-12  500 mcg Oral Daily   Continuous Infusions: . sodium chloride     PRN Meds:.sodium chloride, acetaminophen **AND** diphenhydrAMINE, acetaminophen, albuterol, haloperidol lactate, HYDROcodone-acetaminophen, HYDROcodone-acetaminophen, HYDROmorphone (DILAUDID) injection, menthol-cetylpyridinium **OR** phenol, metoCLOPramide **OR** metoCLOPramide (REGLAN) injection, ondansetron **OR** ondansetron (ZOFRAN) IV, polyethylene glycol, sodium chloride flush, traMADol  Antimicrobials: Anti-infectives (From admission, onward)   Start     Dose/Rate Route Frequency Ordered Stop   06/19/19 1043  clindamycin (CLEOCIN) 900 MG/50ML IVPB    Note to Pharmacy: Bridget Hartshorn   : cabinet override      06/19/19 1043 06/19/19 1049   06/19/19 0845  clindamycin (CLEOCIN) IVPB 900 mg     900 mg 100 mL/hr over 30 Minutes Intravenous On call to O.R. 06/19/19 0748 06/19/19 1103       I have personally reviewed the following labs  and images: CBC: Recent Labs  Lab 06/18/19 1048 06/19/19 0508 06/20/19 0442  WBC 7.4 10.4 14.1*  NEUTROABS 5.4  --   --   HGB 10.4* 10.3* 9.0*  HCT 33.1* 32.9* 29.0*  MCV 105.1* 106.1* 108.2*  PLT 221 208 189   BMP &GFR Recent Labs  Lab 06/18/19 1048 06/19/19 0508 06/20/19 0442  NA 141 143 137  K 4.4 4.7 4.3  CL 104 103 101  CO2 27 28 26   GLUCOSE 129* 119* 110*  BUN 45* 48* 58*  CREATININE 2.01* 2.01* 2.63*  CALCIUM 8.7* 8.5* 8.4*   Estimated Creatinine Clearance: 8.4 mL/min (A) (by C-G formula based on SCr of 2.63 mg/dL (H)). Liver & Pancreas: Recent Labs  Lab 06/18/19 1048 06/19/19 0508  AST 23 20  ALT 20 22  ALKPHOS 102 102  BILITOT 0.9 1.2  PROT 6.5 6.4*  ALBUMIN 3.3* 3.2*   No results for input(s): LIPASE, AMYLASE in the last 168 hours. No results for input(s): AMMONIA in the last 168 hours. Diabetic: No results for input(s): HGBA1C in the last 72 hours. No results for input(s): GLUCAP in the last 168 hours. Cardiac Enzymes: No results for input(s): CKTOTAL, CKMB, CKMBINDEX, TROPONINI in the last 168 hours. No  results for input(s): PROBNP in the last 8760 hours. Coagulation Profile: No results for input(s): INR, PROTIME in the last 168 hours. Thyroid Function Tests: No results for input(s): TSH, T4TOTAL, FREET4, T3FREE, THYROIDAB in the last 72 hours. Lipid Profile: No results for input(s): CHOL, HDL, LDLCALC, TRIG, CHOLHDL, LDLDIRECT in the last 72 hours. Anemia Panel: No results for input(s): VITAMINB12, FOLATE, FERRITIN, TIBC, IRON, RETICCTPCT in the last 72 hours. Urine analysis:    Component Value Date/Time   COLORURINE STRAW (A) 01/22/2019 1930   APPEARANCEUR CLEAR 01/22/2019 1930   LABSPEC 1.006 01/22/2019 1930   PHURINE 7.0 01/22/2019 1930   GLUCOSEU NEGATIVE 01/22/2019 1930   HGBUR NEGATIVE 01/22/2019 1930   [place] NEGATIVE 01/22/2019 [place] NEGATIVE 01/22/2019 1930   PROTEINUR NEGATIVE 01/22/2019 1930   NITRITE  NEGATIVE 01/22/2019 1930   LEUKOCYTESUR NEGATIVE 01/22/2019 1930   Sepsis Labs: Invalid input(s): PROCALCITONIN, [employer]  Microbiology: Recent Results (from the past 240 hour(s))  Respiratory Panel by RT PCR (Flu A&B, Covid) - Nasopharyngeal Swab     Status: None   Collection Time: 06/18/19  3:21 PM   Specimen: Nasopharyngeal Swab  Result Value Ref Range Status   SARS Coronavirus 2 by RT PCR NEGATIVE NEGATIVE Final    Comment: (NOTE) SARS-CoV-2 target nucleic acids are NOT DETECTED. The SARS-CoV-2 RNA is generally detectable in upper respiratoy specimens during the acute phase of infection. The lowest concentration of SARS-CoV-2 viral copies this assay can detect is 131 copies/mL. A negative result does not preclude SARS-Cov-2 infection and should not be used as the sole basis for treatment or other patient management decisions. A negative result may occur with  improper specimen collection/handling, submission of specimen other than nasopharyngeal swab, presence of viral mutation(s) within the areas targeted by this assay, and inadequate number of viral copies (<131 copies/mL). A negative result must be combined with clinical observations, patient history, and epidemiological information. The expected result is Negative. Fact Sheet for Patients:  PinkCheek.be Fact Sheet for Healthcare Providers:  GravelBags.it This test is not yet ap proved or cleared by the [place] FDA and  has been authorized for detection and/or diagnosis of SARS-CoV-2 by FDA under an Emergency Use Authorization (EUA). This EUA will remain  in effect (meaning this test can be used) for the duration of the COVID-19 declaration under Section 564(b)(1) of the Act, 21 U.S.C. section 360bbb-3(b)(1), unless the authorization is terminated or revoked sooner.    Influenza A by PCR NEGATIVE NEGATIVE Final   Influenza B by PCR NEGATIVE NEGATIVE Final     Comment: (NOTE) The Xpert Xpress SARS-CoV-2/FLU/RSV assay is intended as an aid in  the diagnosis of influenza from Nasopharyngeal swab specimens and  should not be used as a sole basis for treatment. Nasal washings and  aspirates are unacceptable for Xpert Xpress SARS-CoV-2/FLU/RSV  testing. Fact Sheet for Patients: PinkCheek.be Fact Sheet for Healthcare Providers: GravelBags.it This test is not yet approved or cleared by the [place] FDA and  has been authorized for detection and/or diagnosis of SARS-CoV-2 by  FDA under an Emergency Use Authorization (EUA). This EUA will remain  in effect (meaning this test can be used) for the duration of the  Covid-19 declaration under Section 564(b)(1) of the Act, 21  U.S.C. section 360bbb-3(b)(1), unless the authorization is  terminated or revoked. Performed at [facility], [address], [place], [place] [address]     Radiology Studies: No results found.     T.  [place]  If 7PM-7AM, please contact night-coverage [email or web address] Password [facility] 06/20/2019, 12:49 PM

## 2019-06-20 NOTE — Progress Notes (Signed)
Progress Note  Patient Name: Holly Bolton Date of Encounter: 06/20/2019  Primary Cardiologist: Sanda Klein, MD   Subjective  Initially irritable after hip surgery yesterday but doing much better this morning. More alert and cooperative. Denies any significant hip pain. No chest pain or shortness of breath.  Inpatient Medications    Scheduled Meds: . acetaminophen  500 mg Oral Q6H  . amiodarone  200 mg Oral Daily  . aspirin  81 mg Oral Daily  . brimonidine  1 drop Both Eyes BID  . carvedilol  3.125 mg Oral BID WC  . Chlorhexidine Gluconate Cloth  6 each Topical Daily  . cholecalciferol  2,000 Units Oral QHS  . docusate sodium  100 mg Oral BID  . furosemide  20 mg Oral Daily  . sodium chloride flush  3 mL Intravenous Q12H  . sodium chloride flush  3 mL Intravenous Q12H  . vitamin B-12  500 mcg Oral Daily   Continuous Infusions: . sodium chloride     PRN Meds: sodium chloride, acetaminophen **AND** diphenhydrAMINE, acetaminophen, albuterol, haloperidol lactate, HYDROcodone-acetaminophen, HYDROcodone-acetaminophen, HYDROmorphone (DILAUDID) injection, menthol-cetylpyridinium **OR** phenol, metoCLOPramide **OR** metoCLOPramide (REGLAN) injection, ondansetron **OR** ondansetron (ZOFRAN) IV, polyethylene glycol, sodium chloride flush, traMADol   Vital Signs    Vitals:   06/19/19 1415 06/19/19 1439 06/19/19 2237 06/20/19 0500  BP: (!) 101/44 (!) 109/46 (!) 106/42 (!) 127/41  Pulse: 60 60 60 60  Resp: 13   16  Temp: 97.8 F (36.6 C)  97.7 F (36.5 C) 98 F (36.7 C)  TempSrc:   Oral Oral  SpO2: 97%  90% 98%  Weight:    40.5 kg  Height:        Intake/Output Summary (Last 24 hours) at 06/20/2019 0812 Last data filed at 06/20/2019 0600 Gross per 24 hour  Intake 1240 ml  Output 140 ml  Net 1100 ml   Last 3 Weights 06/20/2019 06/19/2019 06/18/2019  Weight (lbs) 89 lb 4.8 oz 85 lb 1.6 oz 85 lb 6.4 oz  Weight (kg) 40.506 kg 38.6 kg 38.737 kg      Telemetry    AV paced with  rates in the 60's. - Personally Reviewed  ECG    No new ECG tracing today. - Personally Reviewed  Physical Exam   GEN: Elderly frail female in no acute distress.   Neck: Supple. No JVD Cardiac: RRR. No III/VI sytolic murmur noted at lower sternal border. No rubs or gallops. Radial pulses 2+ and equal bilaterally. Respiratory: No increased work of breathing. Decreased breath sounds in bases (left > right). Possible faint crackles noted in right base. GI: Soft, non-distended, and non-tender. MS: No lower extremity edema. No deformity. Skin: Warm and dry. Neuro:  No focal deficits. Psych: Normal affect. Responds appropriately.  Labs    High Sensitivity Troponin:  No results for input(s): TROPONINIHS in the last 720 hours.    Chemistry Recent Labs  Lab 06/18/19 1048 06/19/19 0508 06/20/19 0442  NA 141 143 137  K 4.4 4.7 4.3  CL 104 103 101  CO2 27 28 26   GLUCOSE 129* 119* 110*  BUN 45* 48* 58*  CREATININE 2.01* 2.01* 2.63*  CALCIUM 8.7* 8.5* 8.4*  PROT 6.5 6.4*  --   ALBUMIN 3.3* 3.2*  --   AST 23 20  --   ALT 20 22  --   ALKPHOS 102 102  --   BILITOT 0.9 1.2  --   GFRNONAA 21* 21* 15*  GFRAA 24* 24* 17*  ANIONGAP 10 12 10      Hematology Recent Labs  Lab 06/18/19 1048 06/19/19 0508 06/20/19 0442  WBC 7.4 10.4 14.1*  RBC 3.15* 3.10* 2.68*  HGB 10.4* 10.3* 9.0*  HCT 33.1* 32.9* 29.0*  MCV 105.1* 106.1* 108.2*  MCH 33.0 33.2 33.6  MCHC 31.4 31.3 31.0  RDW 17.2* 17.2* 17.2*  PLT 221 208 189    BNP Recent Labs  Lab 06/18/19 1048  BNP 941.2*     DDimer No results for input(s): DDIMER in the last 168 hours.   Radiology    CT Head Wo Contrast  Result Date: 06/18/2019 CLINICAL DATA:  Poly trauma, critical, head/cervical spine injury suspected. Ataxia, head trauma. Additional history provided: Patient has fallen multiple times in the past week. EXAM: CT HEAD WITHOUT CONTRAST CT CERVICAL SPINE WITHOUT CONTRAST TECHNIQUE: Multidetector CT imaging of the  head and cervical spine was performed following the standard protocol without intravenous contrast. Multiplanar CT image reconstructions of the cervical spine were also generated. COMPARISON:  Head CT 06/15/2019, report from CT cervical spine 07/17/2011 (images unavailable) FINDINGS: CT HEAD FINDINGS Brain: The examination is mildly motion degraded. Redemonstrated small focus of cortical/subcortical encephalomalacia within the right frontal lobe which may reflect a remote infarct. Background mild ill-defined hypoattenuation within the cerebral white matter is nonspecific, but consistent with chronic small vessel ischemic disease. Stable, moderate generalized parenchymal atrophy. There is no acute intracranial hemorrhage. No extra-axial fluid collection. No evidence of intracranial mass. No midline shift. Vascular: No hyperdense vessel.  Atherosclerotic calcifications. Skull: Normal. Negative for fracture or focal lesion. Sinuses/Orbits: Visualized orbits show no acute finding. No significant paranasal sinus disease or mastoid effusion at the imaged levels. CT CERVICAL SPINE FINDINGS Alignment: C3-C4 and C4-C5 grade 1 anterolisthesis. Skull base and vertebrae: The basion-dental and atlanto-dental intervals are maintained.No evidence of acute fracture to the cervical spine. Congenital nonunion of the posterior arch of C1. Soft tissues and spinal canal: No prevertebral fluid or swelling. No visible canal hematoma. Disc levels: Cervical spondylosis. Most notably at C5-C6 there is moderate/advanced disc height loss with posterior disc osteophyte complexes and uncovertebral hypertrophy. Upper chest: Separately reported. There is a small right apical pneumothorax. Partially imaged left pleural effusion. Other: Subcentimeter thyroid nodules not meeting consensus criteria for ultrasound follow-up. Findings of right apical pneumothorax called by telephone at the time of interpretation on 06/18/2019 at 3:30 pm to provider PA  [place], who verbally acknowledged these results. IMPRESSION: CT head: 1. Mildly motion degraded examination. 2. No evidence of acute intracranial abnormality. 3. Redemonstrated small focus of encephalomalacia within the right frontal lobe which may reflect a remote infarct. 4. Stable generalized parenchymal atrophy and chronic small vessel ischemic disease. CT cervical spine: 1. No evidence of acute fracture to the cervical spine. 2. C3-C4 and C4-C5 grade 1 anterolisthesis, possibly degenerative. 3. Cervical spondylosis, greatest at C5-C6 and C6-C7. 4. Right apical pneumothorax. Please refer to separately reported chest CT for additional intrathoracic findings. Electronically Signed   By: Kellie Simmering DO   On: 06/18/2019 15:32   CT Chest Wo Contrast  Result Date: 06/18/2019 CLINICAL DATA:  Chest pain and shortness of breath. Bilateral pleural effusions. EXAM: CT CHEST WITHOUT CONTRAST TECHNIQUE: Multidetector CT imaging of the chest was performed following the standard protocol without IV contrast. COMPARISON:  Chest x-rays dated 06/18/2019 and 06/10/2019 and chest CT dated 06/15/2019 FINDINGS: Cardiovascular: Chronic cardiomegaly. Previous aortic root repair. Unchanged dilatation of the aortic arch to a diameter of 4 cm, stable. No pericardial effusion.  Pacemaker in place. CABG. Mediastinum/Nodes: Multinodular goiter, unchanged. Trachea is normal. Esophagus appears normal. No appreciable hilar or mediastinal or axillary adenopathy. Lungs/Pleura: Persistent large left effusion and small right effusion, essentially unchanged. Minimal compressive atelectasis at the lung bases, stable. No acute infiltrates. Stable nodule in the right middle lobe, 5 x 7 mm. Upper Abdomen: No acute abnormality. Musculoskeletal: Stable mild compression fracture of T3, unchanged. Marked chronic accentuation of the thoracic kyphosis. No acute bone abnormality. IMPRESSION: 1. Stable large left effusion and small right effusion. 2. Stable  cardiomegaly. 3. Stable dilatation of the aortic arch to a diameter of 4 cm. 4. Stable right middle lobe nodule. 5. Stable mild compression fracture of T3. 6. Marked chronic accentuation of the thoracic kyphosis. 7. Aortic atherosclerosis. Aortic Atherosclerosis (ICD10-I70.0). Electronically Signed   By: Lorriane Shire M.D.   On: 06/18/2019 15:32   CT Cervical Spine Wo Contrast  Result Date: 06/18/2019 CLINICAL DATA:  Poly trauma, critical, head/cervical spine injury suspected. Ataxia, head trauma. Additional history provided: Patient has fallen multiple times in the past week. EXAM: CT HEAD WITHOUT CONTRAST CT CERVICAL SPINE WITHOUT CONTRAST TECHNIQUE: Multidetector CT imaging of the head and cervical spine was performed following the standard protocol without intravenous contrast. Multiplanar CT image reconstructions of the cervical spine were also generated. COMPARISON:  Head CT 06/15/2019, report from CT cervical spine 07/17/2011 (images unavailable) FINDINGS: CT HEAD FINDINGS Brain: The examination is mildly motion degraded. Redemonstrated small focus of cortical/subcortical encephalomalacia within the right frontal lobe which may reflect a remote infarct. Background mild ill-defined hypoattenuation within the cerebral white matter is nonspecific, but consistent with chronic small vessel ischemic disease. Stable, moderate generalized parenchymal atrophy. There is no acute intracranial hemorrhage. No extra-axial fluid collection. No evidence of intracranial mass. No midline shift. Vascular: No hyperdense vessel.  Atherosclerotic calcifications. Skull: Normal. Negative for fracture or focal lesion. Sinuses/Orbits: Visualized orbits show no acute finding. No significant paranasal sinus disease or mastoid effusion at the imaged levels. CT CERVICAL SPINE FINDINGS Alignment: C3-C4 and C4-C5 grade 1 anterolisthesis. Skull base and vertebrae: The basion-dental and atlanto-dental intervals are maintained.No evidence  of acute fracture to the cervical spine. Congenital nonunion of the posterior arch of C1. Soft tissues and spinal canal: No prevertebral fluid or swelling. No visible canal hematoma. Disc levels: Cervical spondylosis. Most notably at C5-C6 there is moderate/advanced disc height loss with posterior disc osteophyte complexes and uncovertebral hypertrophy. Upper chest: Separately reported. There is a small right apical pneumothorax. Partially imaged left pleural effusion. Other: Subcentimeter thyroid nodules not meeting consensus criteria for ultrasound follow-up. Findings of right apical pneumothorax called by telephone at the time of interpretation on 06/18/2019 at 3:30 pm to provider PA [place], who verbally acknowledged these results. IMPRESSION: CT head: 1. Mildly motion degraded examination. 2. No evidence of acute intracranial abnormality. 3. Redemonstrated small focus of encephalomalacia within the right frontal lobe which may reflect a remote infarct. 4. Stable generalized parenchymal atrophy and chronic small vessel ischemic disease. CT cervical spine: 1. No evidence of acute fracture to the cervical spine. 2. C3-C4 and C4-C5 grade 1 anterolisthesis, possibly degenerative. 3. Cervical spondylosis, greatest at C5-C6 and C6-C7. 4. Right apical pneumothorax. Please refer to separately reported chest CT for additional intrathoracic findings. Electronically Signed   By: Kellie Simmering DO   On: 06/18/2019 15:32   CT Hip Right Wo Contrast  Result Date: 06/18/2019 CLINICAL DATA:  Severe right hip pain. Multiple recent falls. Negative radiographs on 06/18/2019 EXAM: CT OF THE  RIGHT HIP WITHOUT CONTRAST TECHNIQUE: Multidetector CT imaging of the right hip was performed according to the standard protocol. Multiplanar CT image reconstructions were also generated. COMPARISON:  Radiographs dated 06/18/2019 and CT scan of the abdomen dated 10/17/2016 FINDINGS: Bones/Joint/Cartilage There is a slightly angulated fracture of  the intertrochanteric region of the proximal right femur. Moderate osteoarthritis of the right hip joint. No dislocation. The visualized pelvic bones are intact. Muscles and Tendons Negative. Soft tissues Slight subcutaneous edema and a tiny subcutaneous hematoma lateral to the right greater trochanter. This is consistent with soft tissue contusion. IMPRESSION: 1. Slightly angulated fracture of the intertrochanteric region of the proximal right femur. 2. Moderate osteoarthritis of the right hip joint. 3. Soft tissue contusion lateral to the right greater trochanter. Electronically Signed   By: Lorriane Shire M.D.   On: 06/18/2019 15:24   DG CHEST PORT 1 VIEW  Result Date: 06/19/2019 CLINICAL DATA:  Pneumothorax. EXAM: PORTABLE CHEST 1 VIEW COMPARISON:  CT cervical spine and chest 06/18/2019. Chest x-ray 06/18/2019. FINDINGS: Cardiac pacer stable position. Prior CABG. Cardiomegaly. Persistent left lower lobe atelectatic changes. Bilateral pulmonary infiltrates/edema. Bilateral pleural effusions. CHF cannot be excluded. Tiny previously identified right apical pneumothorax not identified by this chest x-ray. Plate and screw fixation left humerus again noted. IMPRESSION: 1. Cardiac pacer stable position. Prior CABG. Cardiomegaly. Bilateral pulmonary infiltrates/edema. Bilateral pleural effusions. CHF cannot be excluded. 2.  Persistent left lower lobe atelectatic changes. 3. Tiny previously identified right apical pneumothorax not identified by this chest x-ray. Electronically Signed   By: Marcello Moores  Register   On: 06/19/2019 08:01   DG Chest Port 1 View  Result Date: 06/18/2019 CLINICAL DATA:  Shortness of breath and chest pain EXAM: PORTABLE CHEST 1 VIEW COMPARISON:  June 10, 2019 chest radiograph and chest CT Jun 15, 2019 FINDINGS: There are persistent pleural effusions bilaterally, larger on the left than on the right, stable. There is compressive atelectasis in the left lower lobe with questionable superimposed  pneumonia, stable. No new opacity evident. Heart is upper normal in size with pacemaker leads attached to right atrium and right ventricle. No adenopathy. Status post internal mammary bypass grafting. There is aortic atherosclerosis. Postoperative changes noted in the proximal left humerus. Bones are osteoporotic. IMPRESSION: Pleural effusions bilaterally, larger on the left than on the right, stable. Probable compressive atelectasis with questionable superimposed pneumonia left base, stable. No new opacity evident. Stable cardiac silhouette. Pacemaker leads attached to right atrium and right ventricle. Aortic Atherosclerosis (ICD10-I70.0).  Bones osteoporotic. Electronically Signed   By: Lowella Grip III M.D.   On: 06/18/2019 11:27   DG C-Arm 1-60 Min-No Report  Result Date: 06/19/2019 Fluoroscopy was utilized by the requesting physician.  No radiographic interpretation.   DG HIP UNILAT WITH PELVIS 2-3 VIEWS RIGHT  Result Date: 06/18/2019 CLINICAL DATA:  Multiple recent falls, moderate to severe RIGHT hip pain. EXAM: DG HIP (WITH OR WITHOUT PELVIS) 2-3V RIGHT COMPARISON:  Plain film of the pelvis and LEFT hip dated 06/15/2019. Plain film of the pelvis dated 08/14/2017. FINDINGS: Osseous alignment is normal. Diffuse osteopenia limits characterization of osseous detail, however, there is no fracture line or displaced fracture fragment identified. Mild degenerative change at each hip. Atherosclerotic vascular calcifications within the bilateral upper thighs. Soft tissues about the pelvis and RIGHT hip are otherwise unremarkable IMPRESSION: No acute findings. No osseous fracture or dislocation. Osteopenia. Electronically Signed   By: Franki Cabot M.D.   On: 06/18/2019 11:30   DG FEMUR 1V RIGHT  Result Date: 06/19/2019 CLINICAL DATA:  Right hip ORIF EXAM: RIGHT FEMUR 1 VIEW; DG C-ARM 1-60 MIN-NO REPORT COMPARISON:  06/18/2019 FINDINGS: 5 C-arm fluoroscopic images were obtained intraoperatively and  submitted for post operative interpretation. Images demonstrate interval placement of anterograde short intramedullary rod with single distal interlocking screw and proximal lag screw fixating a nondisplaced intertrochanteric fracture of the proximal right femur. Alignment is anatomic. Osteoarthritis of the right hip. 28 seconds of fluoroscopy time was utilized. Please see the performing provider's procedural report for further detail. IMPRESSION: As above. Electronically Signed   By: Davina Poke D.O.   On: 06/19/2019 12:46    Cardiac Studies   Echocardiogram 05/30/2019: Impressions: 1. There is profound pacing-induced dyssynchrony.. Left ventricular  ejection fraction, by estimation, is 45 to 50%. The left ventricle has  mildly decreased function. The left ventricle has no regional wall motion  abnormalities. There is mild concentric  left ventricular hypertrophy. Left ventricular diastolic parameters are  consistent with Grade II diastolic dysfunction (pseudonormalization).  Elevated left atrial pressure.  2. Large pleural effusion in the left lateral region.  3. The mitral valve is normal in structure. Moderate mitral valve  regurgitation.  4. Tricuspid valve regurgitation is moderate to severe.  5. Medtronic Freestyle root replacement 1999. The aortic valve has been  repaired/replaced. Aortic valve regurgitation is mild. Echo findings are  consistent with normal structure and function of the aortic valve  prosthesis.  6. There is borderline dilatation of the aortic root measuring 40 mm.  7. There is moderately elevated pulmonary artery systolic pressure.  8. The inferior vena cava is normal in size with <50% respiratory  variability, suggesting right atrial pressure of 8 mmHg.   Comparison(s): No significant change from prior study. Prior images  reviewed side by side. LVEF was also 45-50% on the previous echo. _______________  Pacemaker Check 06/02/2019: Not pacemaker  dependent. Battery status is good. Lead measurements are stable. Heart rate histogram is favorable. No clinically significant episodes of high ventricular rate or atrial mode switch noted. No atrial flutter or fibrillation since overdrive pacing one month ago.  Patient Profile     86 y.o. female with a history of CAD s/p single vessel CABG, biscuspid aortic valve s/p AVR (with Medtronic Freestyle root) and ascending aortic aneurysm repair at time of CABG, chronic combined CHF, persistent atrial fibrillation/flutter, sick sinus syndrome and 2nd degree AV blocker s/p PPM, PAD, hypertension, hyperlipidemia prefers not to be on statin, hypothyroidism, and CKD stage III who is being seen for pre-op evaluation for right intertrochanteric hip fracture at the request of Dr. Algis Liming.   Assessment & Plan    Right Intertrochanteric Hip Fracture s/p Repair - Underwent right intramedullary nail on 5/6. Tolerated procedure well.  - Management per Ortho.   Tiny Pneumothorax/ Large Left Pleural Effusion - PCCM following.  - Tiny right pneumothorax felt to be post-traumatic. Not identified on repeat chest x-ray on 5/6.  - Chronic bilateral pleural effusion (left > right) felt to likely be due to chronic CHF but have been present for months to years. PCCM recommend continuing to monitor them for now.  CAD s/p CABG  - Patient had coronary reimplantation with LIMA to LAD at time of aortic valve/root surgery.  - Patient initially complained of a little chest discomfort but could not elaborate. She then said she just hurts all over. Denies any chest pain this morning.Do not suspect angina/ACS.   Chronic Combined CHF - Recent Echo on 05/30/2019 showed LVEF of  45-50% with mild LVH, grade 2 diastolic dysfunction, moderate MR, moderate to severe TR, moderately elevated PASP. The aortic valve prosthesis is functioning well with only mild regurgitation and mild dilation of the aortic root. - BNP elevated at 941  but this is down from 1700's in 01/2019. - Does not appear volume overloaded on exam but does have large left pleural effusion.  - Home Lasix 20mg  daily restarted today.  - Continue to monitor volume status and renal function closely following surgery.  Persistent Atrial Fibrillation/Flutter - Per most recent device check on 06/02/2019, maintaining atrial paced, ventricular paced rhythm with no evidence of atrial fibrillation or flutter over the prior month.  - Currently AV paced on telemetry. - Continue Coreg 3.125mg  twice daily.  - Continue PO Amiodarone 200mg  daily. - Patient previously on Eliquis 2.5mg  twice daily but this was discontinued at office visit last month due to spontaneous bruising and history of falls.   Bicuspid Aortic Valve with Severe Stenosis s/p Ascending Aorta Repair and AVR (Medtronic Freestyle) - Most recent Echo showed normal gradient across prosthetic valve with only mild aortic insufficiency.   Sick Sinus Syndrome/2nd Degree AV Block s/p PPM  - Per Dr. Victorino December last office visit note, she had normal device function with 100% atrial paced and ventricular paced rhythm. He did not think we could avoid the high frequency of RV pacing at that time as this happed when she was in atrial fibrillation and is now felt to be occurring due to Amiodarone therapy. She was not felt to be a good CRT upgrade candidate due to her frailty.   Hypertension - BP soft at times but stable. - Continue current medications.  Hyperlipidemia - Prefers not to be on a statin. Given advanced age, will not push this.  Hypothyroidism - Management per primary team.  Acute on Chronic Kidney Disease Stage IV - Creatinine 2.63 today, up from 2.01 yesterday. Baseline around 1.4 to 1.7. - Does not appear volume overloaded on exam so don't think this is renal congestion. Question whether she needs gentle fluids but would be cautious with this given CHF. - Avoid nephrotoxic agents. -  Continue to monitor closely.  Otherwise, per primary team.   For questions or updates, please contact [place] Please consult [email or web address] for contact info under        Signed, Darreld Mclean, PA-C  06/20/2019, 8:12 AM

## 2019-06-20 NOTE — Evaluation (Signed)
Physical Therapy Evaluation Patient Details Name: Holly Bolton MRN: [id number] DOB: 10-08-1922 Today's Date: 06/20/2019   History of Present Illness  86 year old female, son lives with her, ambulates with the help of a cane, extensive PMH including atrial flutter/persistent atrial fibrillation on amiodarone but Eliquis stopped during cardiology visit 06/02/2019 due to fall risk, chronic systolic CHF, PAD, CAD s/p CABG, s/p bioprosthetic AVR and ascending aortic aneurysm repair, pacemaker for SSS and second-degree AV block, orthostatic hypotension, recurrent falls, hypothyroidism and stage IV CKD, presented to the [facility] ED on 06/18/2019 due to fall and right hip pain and s/p R femur IM nail 06/19/19.  Clinical Impression  Pt admitted with above diagnosis. Pt s/p R femur IM nail due to recent fall. Pt and pt's son endorse 4 falls in the last 30 days, prior to that never really fell. Pt's son previously assisting pt as needed with bathing and dressing and completed household chores. Pt very limited due to R hip pain with movement, once sitting still pt is pleasant and joking with therapy staff. Pt with minimal RLE weightbearing due to pain, requiring max assist x2 with transfers. Son present during eval and agrees that rehab would be best before home due to assistance she is currently requiring. Pt tolerates remaining up in chair with nurse tech in room for hygeine care. Pt currently with functional limitations due to the deficits listed below (see PT Problem List). Pt will benefit from skilled PT to increase their independence and safety with mobility to allow discharge to the venue listed below.       Follow Up Recommendations SNF    Equipment Recommendations  None recommended by PT    Recommendations for Other Services       Precautions / Restrictions Precautions Precautions: Fall Restrictions Weight Bearing Restrictions: No Other Position/Activity Restrictions: RLE WBAT       Mobility  Bed Mobility Overal bed mobility: Needs Assistance Bed Mobility: Supine to Sit  Supine to sit: Max assist;+2 for physical assistance  General bed mobility comments: pt attempts to abd RLE to EOB, but too painful, max assist to bring BLE and to upright trunk  Transfers Overall transfer level: Needs assistance Equipment used: 2 person hand held assist Transfers: Sit to/from [employer] Sit to Stand: Max assist;+2 physical assistance  Squat pivot transfers: Max assist;+2 physical assistance  General transfer comment: STS with therapist and tech on either side of pt requiring max assist x2 with blocking LLE; squat pivot with therapist in front of pt; pt avoiding RLE WB due to increased pain  Ambulation/Gait  General Gait Details: unable  Stairs            Wheelchair Mobility    Modified Rankin (Stroke Patients Only)       Balance Overall balance assessment: Needs assistance Sitting-balance support: Feet supported;Single extremity supported Sitting balance-Leahy Scale: Fair Sitting balance - Comments: seated EOB, propped on L forearm to avoid R hip weightbearing in sitting Postural control: Left lateral lean Standing balance support: During functional activity;Bilateral upper extremity supported Standing balance-Leahy Scale: Zero Standing balance comment: max assist, avoiding RLE WB            Pertinent Vitals/Pain Pain Assessment: Faces Faces Pain Scale: Hurts worst Pain Location: R hip Pain Descriptors / Indicators: Grimacing;Guarding;Moaning Pain Intervention(s): Limited activity within patient's tolerance;Monitored during session;Premedicated before session;Repositioned    Home Living Family/patient expects to be discharged to:: Private residence Living Arrangements: Children(son) Available Help at Discharge: Family;Available 24  hours/day Type of Home: House Home Access: Stairs to enter Entrance Stairs-Rails: Right;Left;Can reach  both Entrance Stairs-Number of Steps: 3 Home Layout: One level Home Equipment: Cane - single point;Walker - 2 wheels;Walker - 4 wheels(rollator)      Prior Function Level of Independence: Needs assistance   Gait / Transfers Assistance Needed: son reports pt ambulates around home with SPC, 4 falls in last 30 days  ADL's / Homemaking Assistance Needed: son reports he assists pt with 30% of dressing/bathing and son completes cooking, cleaning, driving        Hand Dominance        Extremity/Trunk Assessment   Upper Extremity Assessment Upper Extremity Assessment: Overall WFL for tasks assessed    Lower Extremity Assessment Lower Extremity Assessment: Generalized weakness;RLE deficits/detail RLE Deficits / Details: ankle AROM WNL RLE: Unable to fully assess due to pain    Cervical / Trunk Assessment Cervical / Trunk Assessment: Kyphotic  Communication      Cognition Arousal/Alertness: Awake/alert Behavior During Therapy: WFL for tasks assessed/performed Overall Cognitive Status: Within Functional Limits for tasks assessed          General Comments General comments (skin integrity, edema, etc.): on RA upon arrival with SpO2 87% so returned 2L O2 and SpO2 96%    Exercises     Assessment/Plan    PT Assessment Patient needs continued PT services  PT Problem List Decreased strength;Decreased range of motion;Decreased activity tolerance;Decreased balance;Decreased mobility;Decreased coordination;Decreased knowledge of use of DME;Impaired sensation;Pain       PT Treatment Interventions DME instruction;Gait training;Functional mobility training;Therapeutic activities;Therapeutic exercise;Balance training;Neuromuscular re-education;Patient/family education;Modalities    PT Goals (Current goals can be found in the Care Plan section)  Acute Rehab PT Goals Patient Stated Goal: rehab then home PT Goal Formulation: With family Time For Goal Achievement: 07/04/19 Potential to  Achieve Goals: Good    Frequency Min 2X/week   Barriers to discharge        Co-evaluation               AM-PAC PT "6 Clicks" Mobility  Outcome Measure Help needed turning from your back to your side while in a flat bed without using bedrails?: Total Help needed moving from lying on your back to sitting on the side of a flat bed without using bedrails?: Total Help needed moving to and from a bed to a chair (including a wheelchair)?: Total Help needed standing up from a chair using your arms (e.g., wheelchair or bedside chair)?: Total Help needed to walk in hospital room?: Total Help needed climbing 3-5 steps with a railing? : Total 6 Click Score: 6    End of Session   Activity Tolerance: Patient limited by pain Patient left: in chair;with call bell/phone within reach;with chair alarm set;with nursing/sitter in room;with family/visitor present Nurse Communication: Mobility status PT Visit Diagnosis: Unsteadiness on feet (R26.81);Other abnormalities of gait and mobility (R26.89);Repeated falls (R29.6);Muscle weakness (generalized) (M62.81);Pain Pain - Right/Left: Right Pain - part of body: Hip    Time: 2130-8657 PT Time Calculation (min) (ACUTE ONLY): 18 min   Charges:   PT Evaluation $PT Eval Moderate Complexity: 1 Mod         Tori Kaylor Maiers PT, DPT 06/20/19, 12:41 PM

## 2019-06-20 NOTE — TOC Initial Note (Signed)
Transition of Care [facility]) - Initial/Assessment Note    Patient Details  Name: Holly Bolton MRN: [id number] Date of Birth: 1922/10/02  Transition of Care [facility] CM/SW Contact:    Dessa Phi, RN Phone Number: 06/20/2019, 3:39 PM  Clinical Narrative: Patient A x1-self-spoke to son [name]-informed of PT recc SNF-agreed to fax out to SNF-has gone to [facility] in past.Await bed offers.                  Expected Discharge Plan: Skilled Nursing Facility Barriers to Discharge: Continued Medical Work up   Patient Goals and CMS Choice Patient states their goals for this hospitalization and ongoing recovery are:: go to rehab CMS Medicare.gov Compare Post Acute Care list provided to:: Patient Represenative (must comment)([name] (son)) Choice offered to / list presented to : Adult Children  Expected Discharge Plan and Services Expected Discharge Plan: [place]   Discharge Planning Services: CM Consult   Living arrangements for the past 2 months: Single Family Home                                      Prior Living Arrangements/Services Living arrangements for the past 2 months: Single Family Home Lives with:: Adult Children Patient language and need for interpreter reviewed:: Yes Do you feel safe going back to the place where you live?: Yes      Need for Family Participation in Patient Care: No (Comment) Care giver support system in place?: Yes (comment) Current home services: DME(cane, rw) Criminal Activity/Legal Involvement Pertinent to Current Situation/Hospitalization: No - Comment as needed  Activities of Daily Living Home Assistive Devices/Equipment: Cane (specify quad or straight), Eyeglasses, Shower chair with back ADL Screening (condition at time of admission) Patient's cognitive ability adequate to safely complete daily activities?: No Is the patient deaf or have difficulty hearing?: No Does the patient have difficulty seeing, even when  wearing glasses/contacts?: No Does the patient have difficulty concentrating, remembering, or making decisions?: Yes(confused at times) Patient able to express need for assistance with ADLs?: Yes Does the patient have difficulty dressing or bathing?: Yes Independently performs ADLs?: No Communication: Independent Dressing (OT): Needs assistance Is this a change from baseline?: Pre-admission baseline Grooming: Needs assistance Is this a change from baseline?: Pre-admission baseline Feeding: Independent Bathing: Needs assistance Is this a change from baseline?: Pre-admission baseline Toileting: Needs assistance Is this a change from baseline?: Pre-admission baseline In/Out Bed: Needs assistance Is this a change from baseline?: Pre-admission baseline Walks in Home: Needs assistance(uses a cane) Is this a change from baseline?: Pre-admission baseline Does the patient have difficulty walking or climbing stairs?: Yes Weakness of Legs: Both Weakness of Arms/Hands: Both  Permission Sought/Granted Permission sought to share information with : Case Manager Permission granted to share information with : Yes, Verbal Permission Granted  Share Information with NAME: Case Manager  Permission granted to share info w AGENCY: SNF  Permission granted to share info w Relationship: Cheron Coryell son [phone]     Emotional Assessment Appearance:: Appears stated age Attitude/Demeanor/Rapport: Gracious Affect (typically observed): Accepting Orientation: : Oriented to Self Alcohol / Substance Use: Not Applicable Psych Involvement: No (comment)  Admission diagnosis:  Pain [R52] Hypoxia [R09.02] Pneumothorax on right [J93.9] Closed intertrochanteric fracture of right hip ([place]) [S72.141A] Traumatic pneumothorax, initial encounter [S27.0XXA] Closed fracture of right hip, initial encounter [facility]) [S72.001A] Patient Active Problem List   Diagnosis Date  Noted  . Closed fracture of right hip ([place])    . Preoperative clearance   . Closed intertrochanteric fracture of right hip ([place]) 06/18/2019  . Chronic systolic CHF (congestive heart failure) ([place]) 06/18/2019  . Acute kidney injury superimposed on CKD ([place]) 06/18/2019  . Adult failure to thrive 06/18/2019  . CHB (complete heart block) ([place])   . Secondary hypercoagulable state ([place]) 04/24/2019  . Atypical atrial flutter ([place])   . Acute on chronic congestive heart failure ([place]) 01/22/2019  . Chronic kidney disease, stage 3   . Persistent atrial fibrillation ([place])   . Macrocytic anemia   . Acute on chronic diastolic CHF (congestive heart failure) ([place]) 10/11/2018  . Chronic kidney disease, stage 4 (severe) ([place]) 09/26/2018  . Intermittent claudication ([place]) 06/27/2018  . Mild intermittent asthma without complication 78/41/2820  . Senile osteoporosis 06/27/2018  . Bilateral bunions 03/28/2018  . Hyperthyroidism 03/20/2018  . Weight loss, unintentional 02/11/2018  . H/O hyperthyroidism 02/11/2018  . Memory loss, short term 02/11/2018  . Frailty syndrome in geriatric patient 02/11/2018  . Urge incontinence of urine 02/11/2018  . S/p nephrectomy 02/11/2018  . B12 deficiency 02/11/2018  . Elevated troponin   . Asthma 08/15/2017  . Open left humeral fracture 08/15/2017  . Fracture 08/15/2017  . Pacemaker battery depletion 05/17/2017  . Leg cramps 06/12/2016  . Peripheral arterial disease ([place]) 12/08/2014  . Status post ascending aortic aneurysm repair/AVR -  Medtronic Freestyle root 09/28/2014  . CAD s/p CABG (LIMA to LAD) 07/08/2013  . History of aortic root repairand bioprosthetic AVR 07/08/2013  . Hyperlipidemia 07/08/2013  . SSS (sick sinus syndrome) ([place]) 07/08/2013  . Second degree AV block 07/08/2013  . Pacemaker, dual chamber St. Jude 2003/2010/2019 07/08/2013  . Essential hypertension 07/08/2013   PCP:  Gayland Curry, DO Pharmacy:   [facility], [place] [address] Phone: [phone] Fax: [phone]     Social Determinants of Health (SDOH) Interventions    Readmission Risk Interventions No flowsheet data found.

## 2019-06-20 NOTE — NC FL2 (Signed)
[place] LEVEL OF CARE SCREENING TOOL     IDENTIFICATION  Patient Name: Holly Bolton Birthdate: 09/08/1922 Sex: female Admission Date (Current Location): 06/18/2019  [facility] and [place] Number:  Herbalist and Address:  [facility],  [place] [address] [place]      Provider Number: [id number]  Attending Physician Name and Address:  Mercy Riding, MD  Relative Name and Phone Number:  [name] Clodfelter(son)[phone]    Current Level of Care: Hospital Recommended Level of Care: [place] Prior Approval Number:    Date Approved/Denied:   PASRR Number: [id number]  Discharge Plan: SNF    Current Diagnoses: Patient Active Problem List   Diagnosis Date Noted  . Closed fracture of right hip ([place])   . Preoperative clearance   . Closed intertrochanteric fracture of right hip ([place]) 06/18/2019  . Chronic systolic CHF (congestive heart failure) ([place]) 06/18/2019  . Acute kidney injury superimposed on CKD ([place]) 06/18/2019  . Adult failure to thrive 06/18/2019  . CHB (complete heart block) ([place])   . Secondary hypercoagulable state ([place]) 04/24/2019  . Atypical atrial flutter ([place])   . Acute on chronic congestive heart failure ([place]) 01/22/2019  . Chronic kidney disease, stage 3   . Persistent atrial fibrillation ([place])   . Macrocytic anemia   . Acute on chronic diastolic CHF (congestive heart failure) ([place]) 10/11/2018  . Chronic kidney disease, stage 4 (severe) ([place]) 09/26/2018  . Intermittent claudication ([place]) 06/27/2018  . Mild intermittent asthma without complication 82/50/5397  . Senile osteoporosis 06/27/2018  . Bilateral bunions 03/28/2018  . Hyperthyroidism 03/20/2018  . Weight loss, unintentional 02/11/2018  . H/O hyperthyroidism 02/11/2018  . Memory loss, short term 02/11/2018  . Frailty syndrome in geriatric patient 02/11/2018  . Urge incontinence of urine 02/11/2018  . S/p nephrectomy 02/11/2018  . B12  deficiency 02/11/2018  . Elevated troponin   . Asthma 08/15/2017  . Open left humeral fracture 08/15/2017  . Fracture 08/15/2017  . Pacemaker battery depletion 05/17/2017  . Leg cramps 06/12/2016  . Peripheral arterial disease ([place]) 12/08/2014  . Status post ascending aortic aneurysm repair/AVR -  Medtronic Freestyle root 09/28/2014  . CAD s/p CABG (LIMA to LAD) 07/08/2013  . History of aortic root repairand bioprosthetic AVR 07/08/2013  . Hyperlipidemia 07/08/2013  . SSS (sick sinus syndrome) ([place]) 07/08/2013  . Second degree AV block 07/08/2013  . Pacemaker, dual chamber St. Jude 2003/2010/2019 07/08/2013  . Essential hypertension 07/08/2013    Orientation RESPIRATION BLADDER Height & Weight     Self  Normal Incontinent Weight: 40.5 kg Height:  5\' 3"  (160 cm)  BEHAVIORAL SYMPTOMS/MOOD NEUROLOGICAL BOWEL NUTRITION STATUS      Incontinent Diet(Regular)  AMBULATORY STATUS COMMUNICATION OF NEEDS Skin   Limited Assist Verbally Normal                       Personal Care Assistance Level of Assistance  Bathing, Feeding, Dressing Bathing Assistance: Limited assistance Feeding assistance: Limited assistance Dressing Assistance: Limited assistance     Functional Limitations Info  Sight, Hearing, Speech Sight Info: Impaired(eyeglasses) Hearing Info: Adequate Speech Info: Adequate    SPECIAL CARE FACTORS FREQUENCY  PT (By licensed PT), OT (By licensed OT)     PT Frequency: 5x week OT Frequency: 5x week            Contractures Contractures Info: Not present    Additional Factors Info  Code Status, Allergies  Code Status Info: DNR Allergies Info: Accupril Quinapril Hcl, Amlodipine, Benadryl Diphenhydramine Hcl, Biaxin Clarithromycin, Ciprofloxacin, Codeine, Diovan Valsartan, Medrol Methylprednisolone, Morphine And Related, Neurontin Gabapentin, Penicillins           Current Medications (06/20/2019):  This is the current hospital active medication list Current  Facility-Administered Medications  Medication Dose Route Frequency Provider Last Rate Last Admin  . 0.9 %  sodium chloride infusion  250 mL Intravenous PRN Grier Mitts, PA-C      . acetaminophen (TYLENOL) tablet 325 mg  325 mg Oral QHS PRN Grier Mitts, PA-C       And  . diphenhydrAMINE (BENADRYL) 12.5 MG/5ML elixir 12.5 mg  12.5 mg Oral QHS PRN Grier Mitts, PA-C      . acetaminophen (TYLENOL) tablet 325-650 mg  325-650 mg Oral Q6H PRN Grier Mitts, PA-C      . albuterol (PROVENTIL) (2.5 MG/3ML) 0.083% nebulizer solution 2.5 mg  2.5 mg Nebulization Q4H PRN Grier Mitts, PA-C      . amiodarone (PACERONE) tablet 200 mg  200 mg Oral Daily Maryanne, [place], PA-C   200 mg at 06/20/19 0903  . aspirin chewable tablet 81 mg  81 mg Oral Daily Krysta, [place], PA-C   81 mg at 06/20/19 0903  . brimonidine (ALPHAGAN) 0.2 % ophthalmic solution 1 drop  1 drop Both Eyes BID Grier Mitts, PA-C   1 drop at 06/20/19 0905  . carvedilol (COREG) tablet 3.125 mg  3.125 mg Oral BID WC Grier Mitts, PA-C   3.125 mg at 06/19/19 0829  . cholecalciferol (VITAMIN D) tablet 2,000 Units  2,000 Units Oral QHS Grier Mitts, PA-C   2,000 Units at 06/19/19 2251  . docusate sodium (COLACE) capsule 100 mg  100 mg Oral BID Analaura, [place], PA-C   100 mg at 06/20/19 1914  . haloperidol lactate (HALDOL) injection 1 mg  1 mg Intravenous Once PRN Amin, Ankit Chirag, MD      . heparin injection 5,000 Units  5,000 Units Subcutaneous Q8H Mercy Riding, MD   5,000 Units at 06/20/19 1408  . HYDROcodone-acetaminophen (NORCO) 7.5-325 MG per tablet 1-2 tablet  1-2 tablet Oral Q4H PRN Grier Mitts, PA-C      . HYDROcodone-acetaminophen (NORCO/VICODIN) 5-325 MG per tablet 1-2 tablet  1-2 tablet Oral Q4H PRN Grier Mitts, PA-C      . HYDROmorphone (DILAUDID) injection 0.5 mg  0.5 mg Intravenous Q8H PRN Grier Mitts, PA-C   0.5 mg at 06/19/19 0608  .  menthol-cetylpyridinium (CEPACOL) lozenge 3 mg  1 lozenge Oral PRN Grier Mitts, PA-C       Or  . phenol (CHLORASEPTIC) mouth spray 1 spray  1 spray Mouth/Throat PRN Grier Mitts, PA-C      . metoCLOPramide (REGLAN) tablet 5 mg  5 mg Oral Q8H PRN Grier Mitts, PA-C       Or  . metoCLOPramide (REGLAN) injection 5 mg  5 mg Intravenous Q8H PRN Laliberte, Danielle, PA-C      . ondansetron (ZOFRAN) tablet 4 mg  4 mg Oral Q6H PRN Grier Mitts, PA-C       Or  . ondansetron (ZOFRAN) injection 4 mg  4 mg Intravenous Q6H PRN Laliberte, Danielle, PA-C      . polyethylene glycol (MIRALAX / GLYCOLAX) packet 17 g  17 g Oral Daily PRN Grier Mitts, PA-C      . sodium chloride flush (NS) 0.9 % injection 3 mL  3 mL Intravenous Q12H Grier Mitts, PA-C      .  sodium chloride flush (NS) 0.9 % injection 3 mL  3 mL Intravenous Q12H Grier Mitts, PA-C   3 mL at 06/19/19 2257  . sodium chloride flush (NS) 0.9 % injection 3 mL  3 mL Intravenous PRN Grier Mitts, PA-C      . traMADol Veatrice Bourbon) tablet 50 mg  50 mg Oral Q6H PRN Grier Mitts, PA-C      . vitamin B-12 (CYANOCOBALAMIN) tablet 500 mcg  500 mcg Oral Daily Jaymie, Mckiddy, PA-C   500 mcg at 06/20/19 6440     Discharge Medications: Please see discharge summary for a list of discharge medications.  Relevant Imaging Results:  Relevant Lab Results:   Additional Information SS#: [id number]  Dessa Phi, RN

## 2019-06-20 NOTE — Progress Notes (Signed)
Visited with patient who staff was concerned about as she was not eating or drinking anything.  She asked where she was-I explained Hospital and with great caregivers-nurses and doctors.  She expressed she wants to go home. She seemed confused as to what a chaplain does-I expressed some people like to have the chaplain pray for them and some like to just talk with the chaplain.  We talked and she asked for prayer after a short visit. Conard Novak, Chaplain'   06/20/19 1400  Clinical Encounter Type  Visited With Patient  Visit Type Spiritual support  Referral From Other (Comment)  Spiritual Encounters  Spiritual Needs Prayer (prayer)  Stress Factors  Patient Stress Factors Other (Comment) (wants to go home-confused about where she is)

## 2019-06-20 NOTE — Progress Notes (Signed)
Palliative care progress note  Palliative care consult received.  Chart reviewed.  I met today with Holly Bolton and her son.  On arrival, her son and I discussed that they have already established goals moving forward and plan is for transition to skilled facility for trial of rehab.  We discussed the fact that Holly Bolton has multiple comorbidities and a new hip fracture which may be an event from which she does not recover to her prior baseline.  We discussed continuing to evaluate her nutrition, cognition, and functional status moving forward as guides on how she continues to progress following this injury.  Holly Bolton follows with Dr. Mariea Clonts as an outpatient.  Holly Bolton son expressed that be very comfortable with Dr. Mariea Clonts and her recommendations for his mother's care moving forward.  I have recommended that following trial of rehab, they follow-up with Dr. Mariea Clonts to further discuss long-term goals of care moving forward based upon her clinical course over the next several weeks at rehab facility.  Micheline Rough, MD [place] Palliative Medicine Team [phone]  NO CHARGE NOTE

## 2019-06-20 NOTE — Progress Notes (Signed)
When receiving report from dayshift RN was informed that patient had foley pulled in the morning time and has had no urine output since. Was bladder scanned at 1700 per charting and had 52 mL. Patient has had very poor PO intake. Received order to give patient 500 mL bolus however upon assessment patient found to have no IV. Patient refusing to let RN touch patient. Patient refusing an IV, bladder scan or assessment at this time. Patient provided with fluids and encouraged PO intake, patient refusing this as well. Dr. Humphrey Rolls, on-call hospitalist made aware. Will continue to encourage fluids and attempt to assess patient.

## 2019-06-20 NOTE — Progress Notes (Signed)
   PATIENT ID: Holly Bolton   1 Day Post-Op Procedure(s) (LRB): INTRAMEDULLARY (IM) NAIL INTERTROCHANTRIC (Right)  Subjective: Per son is doing well after initially irritable after surgery. Slept some overnight, no complaints of pain this am.   Objective:  Vitals:   06/20/19 0500 06/20/19 0820  BP: (!) 127/41 (!) 127/45  Pulse: 60 60  Resp: 16 16  Temp: 98 F (36.7 C) 98 F (36.7 C)  SpO2: 98% 97%     R hip dressing c/d/i Wiggles toes, distally NVI  Labs:  Recent Labs    06/18/19 1048 06/19/19 0508 06/20/19 0442  HGB 10.4* 10.3* 9.0*   Recent Labs    06/19/19 0508 06/20/19 0442  WBC 10.4 14.1*  RBC 3.10* 2.68*  HCT 32.9* 29.0*  PLT 208 189   Recent Labs    06/19/19 0508 06/20/19 0442  NA 143 137  K 4.7 4.3  CL 103 101  CO2 28 26  BUN 48* 58*  CREATININE 2.01* 2.63*  GLUCOSE 119* 110*  CALCIUM 8.5* 8.4*    Assessment and Plan: 1 day s/p right hip IM nail WBAT  Up with PT today Minimize narcotics/pain meds to tylenol/tramadol D/c to snf  ABLA- expected PO, asymptomatic Follow up w Dr. Tamera Punt in 2 weeks  VTE proph: asa, scds

## 2019-06-20 NOTE — Care Management Important Message (Signed)
Important Message  Patient Details IM Letter given to Dessa Phi RN Case Manager to present to the Patient Name: Holly Bolton MRN: [id number] Date of Birth: Jan 26, 1923   Medicare Important Message Given:  Yes     Kerin Salen 06/20/2019, 11:51 AM

## 2019-06-21 ENCOUNTER — Inpatient Hospital Stay (HOSPITAL_COMMUNITY): Payer: Medicare Other

## 2019-06-21 DIAGNOSIS — I5043 Acute on chronic combined systolic (congestive) and diastolic (congestive) heart failure: Secondary | ICD-10-CM

## 2019-06-21 DIAGNOSIS — S72144G Nondisplaced intertrochanteric fracture of right femur, subsequent encounter for closed fracture with delayed healing: Secondary | ICD-10-CM

## 2019-06-21 DIAGNOSIS — I2510 Atherosclerotic heart disease of native coronary artery without angina pectoris: Secondary | ICD-10-CM | POA: Diagnosis not present

## 2019-06-21 DIAGNOSIS — R627 Adult failure to thrive: Secondary | ICD-10-CM | POA: Diagnosis not present

## 2019-06-21 DIAGNOSIS — I4819 Other persistent atrial fibrillation: Secondary | ICD-10-CM | POA: Diagnosis not present

## 2019-06-21 LAB — RENAL FUNCTION PANEL
Albumin: 3.1 g/dL — ABNORMAL LOW (ref 3.5–5.0)
Anion gap: 11 (ref 5–15)
BUN: 69 mg/dL — ABNORMAL HIGH (ref 8–23)
CO2: 25 mmol/L (ref 22–32)
Calcium: 8.4 mg/dL — ABNORMAL LOW (ref 8.9–10.3)
Chloride: 98 mmol/L (ref 98–111)
Creatinine, Ser: 3.04 mg/dL — ABNORMAL HIGH (ref 0.44–1.00)
GFR calc Af Amer: 15 mL/min — ABNORMAL LOW (ref >60)
GFR calc non Af Amer: 13 mL/min — ABNORMAL LOW (ref >60)
Glucose, Bld: 121 mg/dL — ABNORMAL HIGH (ref 70–99)
Phosphorus: 4.7 mg/dL — ABNORMAL HIGH (ref 2.5–4.6)
Potassium: 4.5 mmol/L (ref 3.5–5.1)
Sodium: 134 mmol/L — ABNORMAL LOW (ref 135–145)

## 2019-06-21 LAB — CBC
HCT: 27 % — ABNORMAL LOW (ref 36.0–46.0)
Hemoglobin: 8.7 g/dL — ABNORMAL LOW (ref 12.0–15.0)
MCH: 32.7 pg (ref 26.0–34.0)
MCHC: 32.2 g/dL (ref 30.0–36.0)
MCV: 101.5 fL — ABNORMAL HIGH (ref 80.0–100.0)
Platelets: 198 10*3/uL (ref 150–400)
RBC: 2.66 MIL/uL — ABNORMAL LOW (ref 3.87–5.11)
RDW: 17 % — ABNORMAL HIGH (ref 11.5–15.5)
WBC: 11.1 10*3/uL — ABNORMAL HIGH (ref 4.0–10.5)
nRBC: 0.7 % — ABNORMAL HIGH (ref 0.0–0.2)

## 2019-06-21 LAB — MAGNESIUM: Magnesium: 2.1 mg/dL (ref 1.7–2.4)

## 2019-06-21 LAB — SODIUM, URINE, RANDOM: Sodium, Ur: <10 mmol/L

## 2019-06-21 LAB — CREATININE, URINE, RANDOM: Creatinine, Urine: 111.8 mg/dL

## 2019-06-21 MED ORDER — HALOPERIDOL LACTATE 5 MG/ML IJ SOLN: 1.0000 mg | INTRAMUSCULAR | Status: DC | PRN

## 2019-06-21 MED ORDER — CLONAZEPAM 0.125 MG PO TBDP
0.2500 mg | ORAL_TABLET | Freq: Two times a day (BID) | ORAL | Status: DC | PRN
  Administered 2019-06-21 (×2): 0.25 mg via ORAL
  Filled 2019-06-21 (×2): qty 2

## 2019-06-21 MED ORDER — SODIUM CHLORIDE 0.9 % IV SOLN: INTRAVENOUS | Status: DC

## 2019-06-21 MED ORDER — SODIUM CHLORIDE 0.9 % IV BOLUS
500.0000 mL | Freq: Once | INTRAVENOUS | Status: AC
  Administered 2019-06-21: 500 mL via INTRAVENOUS

## 2019-06-21 MED ORDER — OXYCODONE HCL 5 MG PO TABS: 5.0000 mg | ORAL_TABLET | Freq: Three times a day (TID) | ORAL | Status: DC | PRN

## 2019-06-21 MED ORDER — ACETAMINOPHEN 500 MG PO TABS
1000.0000 mg | ORAL_TABLET | Freq: Three times a day (TID) | ORAL | Status: DC
  Administered 2019-06-21 – 2019-06-24 (×8): 1000 mg via ORAL
  Filled 2019-06-21 (×8): qty 2

## 2019-06-21 NOTE — Progress Notes (Signed)
    Patient doing well PO day 2 S/P R IM nail, pain well controlled, has been up to toilet, WBAT, slow progress in PT, awaiting SNF placement. Pt mildly demented and irritable. NL B/B function.   Physical Exam: Vitals:   06/20/19 2038 06/21/19 0417  BP: 124/61 (!) 131/51  Pulse: 61 86  Resp:  18  Temp: 98 F (36.7 C) 98.2 F (36.8 C)  SpO2:  92%   CBC Latest Ref Rng & Units 06/21/2019 06/20/2019 06/19/2019  WBC 4.0 - 10.5 K/uL 11.1(H) 14.1(H) 10.4  Hemoglobin 12.0 - 15.0 g/dL 8.7(L) 9.0(L) 10.3(L)  Hematocrit 36.0 - 46.0 % 27.0(L) 29.0(L) 32.9(L)  Platelets 150 - 400 K/uL 198 189 208   BMP Latest Ref Rng & Units 06/21/2019 06/20/2019 06/19/2019  Glucose 70 - 99 mg/dL 121(H) 110(H) 119(H)  BUN 8 - 23 mg/dL 69(H) 58(H) 48(H)  Creatinine 0.44 - 1.00 mg/dL 3.04(H) 2.63(H) 2.01(H)  BUN/Creat Ratio 6 - 22 (calc) - - -  Sodium 135 - 145 mmol/L 134(L) 137 143  Potassium 3.5 - 5.1 mmol/L 4.5 4.3 4.7  Chloride 98 - 111 mmol/L 98 101 103  CO2 22 - 32 mmol/L 25 26 28   Calcium 8.9 - 10.3 mg/dL 8.4(L) 8.4(L) 8.5(L)    Dressing in place, CDI NVI  POD #2 s/p Right hip IM nail   - up with PT/OT, encourage ambulation  -WBAT Minimize narcotics/pain meds to tylenol/tramadol D/c to snf pending ABLA- expected PO, asymptomatic Follow up w Dr. Tamera Punt in 2 weeks  VTE proph: asa, scds

## 2019-06-21 NOTE — Progress Notes (Signed)
PROGRESS NOTE  Holly Bolton IHK:[id number] DOB: [birth year]   PCP: Gayland Curry, DO  Patient is from: Home.  Lives with his son.  Uses cane at baseline.  DOA: 06/18/2019 LOS: 3  Brief Narrative / Interim history: 86 year old with history of atrial flutter/fibrillation on amiodarone not on anticoagulation, chronic systolic CHF, CAD status post CABG, status post AVR, PAD, aortic aneurysm repair, pacemaker for sick sinus syndrome, second-degree AV block, hypothyroidism, CKD stage IV admitted after a fall and right hip pain.  Trauma work-up including CT head and CT cervical spine negative.  Showed right apical pneumothorax.  CT chest showed large left-sided and small right-sided pleural effusion, mild T3 compression fracture.  CT of the right hip showed right femur fracture.  Orthopedic consulted. She underwent intramedullary nailing on 5/6.   Hospital course complicated by acute respiratory failure thought to be due to CHF exacerbation after surgery.  Cardiology consulted.  Attempted to diurese with p.o. Lasix but did not tolerate due to renal function.  Lasix discontinued.  Started on IV fluid.  Subjective: Seen and examined earlier this morning.  Had some confusion and agitation overnight.  Also no urine output overnight.  No complaint this morning but does not feel like talking much.  Patient son at bedside.  Objective: Vitals:   06/20/19 1254 06/20/19 2038 06/21/19 0417 06/21/19 1340  BP: (!) 123/47 124/61 (!) 131/51 (!) 159/115  Pulse: 64 61 86 92  Resp:   18 18  Temp: 98.2 F (36.8 C) 98 F (36.7 C) 98.2 F (36.8 C) 97.6 F (36.4 C)  TempSrc: Oral Oral Oral Oral  SpO2: 97%  92% 90%  Weight:   40.6 kg   Height:        Intake/Output Summary (Last 24 hours) at 06/21/2019 1357 Last data filed at 06/21/2019 0600 Gross per 24 hour  Intake 50 ml  Output 0 ml  Net 50 ml   Filed Weights   06/19/19 0500 06/20/19 0500 06/21/19 0417  Weight: 38.6 kg 40.5 kg 40.6 kg     Examination:  GENERAL: Frail looking elderly female.  No apparent distress. HEENT: MMM.  Vision and hearing grossly intact.  NECK: Supple.  No apparent JVD.  RESP: On room air.  No IWOB.  Fair aeration bilaterally. CVS:  RRR. Heart sounds normal.  ABD/GI/GU: Bowel sounds present. Soft. Non tender.  MSK/EXT:  Moves extremities.  Significant muscle mass and subcu fat loss.  No edema. SKIN: no apparent skin lesion or wound NEURO: Awake and alert.  Could not assess orientation. No apparent focal neuro deficit. PSYCH: Calm but does not feel like talking.  No distress or agitation.  Procedures:  5/6-intramedullary nailing of right femoral fracture  Microbiology summarized: Influenza PCR negative. COVID-19 PCR negative.  Assessment & Plan: Mechanical fall leading to closed right intertrochanteric fracture -IM nailing of right femoral fracture by orthopedic surgery on 5/6 -PT/OT and  WBAT.  -VTE PPx and pain control per Ortho. -Bowel regimen  Acute respiratory failure with hypoxia: Seems to have resolved. She was on room air with nasal cannula and tachypneic this morning -Encourage incentive spirometry -OOB/PT/OT -Minimal oxygen to maintain saturation above 90%.  Small right apical pneumothorax-resolved on subsequent chest x-ray. -Conservative care per pulmonology  Chronic bilateral left greater than right pleural effusion-unclear etiology. No signs of fluid overload. -May need diagnostic+/-therapeutic thoracocentesis although respiratory failure seems to have resolved.  Chronic combined CHF: Echo 4/16-EF 45-50%, grade 2 DD. Appears euvolemic. On p.o. Lasix 20 mg daily  at home. BNP 941 but lower than previous values. Now worsening renal function even of p.o. Lasix. -Continue holding Lasix -Gentle IV fluid. -Monitor fluid status and renal function -Liberate fluid intake.  Chronic CAD/PAD: No anginal symptoms. -Continue home meds.  AKI on CKD-4/azotemia: Baseline  Cr~1.5>2.01 (admit)> 2.63> 3.04. BUN 45> 58> 69 -Continue holding Lasix. -NS at 60 cc an hour for the next 24 hours -Urine sodium and creatinine -Renal ultrasound  Anemia of renal disease: Baseline Hgb 9-10> 10.3 (admit)> 9.0> 8.7 -Continue monitoring -Check anemia panel  Persistent atrial fibrillation: Rate controlled. -Continue home amiodarone -Not on anticoagulation due to fall risk  Debility/physical deconditioning -PT/OT  SSS s/p PPM  Aortic aneurysm status post repair  Goal of care/DNR/DNI-appreciate palliative care input. -Patient to follow-up outpatient.   Severe protein calorie malnutrition/Adult DJS:HFWY mass index is 15.84 kg/m. Significant muscle mass and subcu fat loss. -Consult dietitian         DVT prophylaxis: Start subcu heparin Code Status: DNR/DNI Family Communication: Updated patient's son at bedside. Status is: Inpatient  Remains inpatient appropriate because:Ongoing diagnostic testing needed not appropriate for outpatient work up, Unsafe d/c plan, IV treatments appropriate due to intensity of illness or inability to take PO and Inpatient level of care appropriate due to severity of illness   Dispo: The patient is from: Home              Anticipated d/c is to: SNF              Anticipated d/c date is: 2 days              Patient currently is not medically stable to d/c.  due to progressive AKI requiring IV fluid and further evaluation.     Consultants:  Orthopedic surgery Palliative care   Sch Meds:  Scheduled Meds: . amiodarone  200 mg Oral Daily  . aspirin  81 mg Oral Daily  . brimonidine  1 drop Both Eyes BID  . carvedilol  3.125 mg Oral BID WC  . cholecalciferol  2,000 Units Oral QHS  . docusate sodium  100 mg Oral BID  . heparin injection (subcutaneous)  5,000 Units Subcutaneous Q8H  . sodium chloride flush  3 mL Intravenous Q12H  . sodium chloride flush  3 mL Intravenous Q12H  . vitamin B-12  500 mcg Oral Daily    Continuous Infusions: . sodium chloride    . sodium chloride 60 mL/hr at 06/21/19 0949  . sodium chloride     PRN Meds:.sodium chloride, acetaminophen **AND** diphenhydrAMINE, acetaminophen, albuterol, haloperidol lactate, HYDROcodone-acetaminophen, HYDROcodone-acetaminophen, HYDROmorphone (DILAUDID) injection, menthol-cetylpyridinium **OR** phenol, metoCLOPramide **OR** metoCLOPramide (REGLAN) injection, ondansetron **OR** ondansetron (ZOFRAN) IV, polyethylene glycol, sodium chloride flush, traMADol  Antimicrobials: Anti-infectives (From admission, onward)   Start     Dose/Rate Route Frequency Ordered Stop   06/19/19 1043  clindamycin (CLEOCIN) 900 MG/50ML IVPB    Note to Pharmacy: Bridget Hartshorn   : cabinet override      06/19/19 1043 06/19/19 1049   06/19/19 0845  clindamycin (CLEOCIN) IVPB 900 mg     900 mg 100 mL/hr over 30 Minutes Intravenous On call to O.R. 06/19/19 0748 06/19/19 1103       I have personally reviewed the following labs and images: CBC: Recent Labs  Lab 06/18/19 1048 06/19/19 0508 06/20/19 0442 06/21/19 0603  WBC 7.4 10.4 14.1* 11.1*  NEUTROABS 5.4  --   --   --   HGB 10.4* 10.3* 9.0* 8.7*  HCT 33.1*  32.9* 29.0* 27.0*  MCV 105.1* 106.1* 108.2* 101.5*  PLT 221 208 189 198   BMP &GFR Recent Labs  Lab 06/18/19 1048 06/19/19 0508 06/20/19 0442 06/21/19 0603  NA 141 143 137 134*  K 4.4 4.7 4.3 4.5  CL 104 103 101 98  CO2 27 28 26 25   GLUCOSE 129* 119* 110* 121*  BUN 45* 48* 58* 69*  CREATININE 2.01* 2.01* 2.63* 3.04*  CALCIUM 8.7* 8.5* 8.4* 8.4*  MG  --   --   --  2.1  PHOS  --   --   --  4.7*   Estimated Creatinine Clearance: 7.3 mL/min (A) (by C-G formula based on SCr of 3.04 mg/dL (H)). Liver & Pancreas: Recent Labs  Lab 06/18/19 1048 06/19/19 0508 06/21/19 0603  AST 23 20  --   ALT 20 22  --   ALKPHOS 102 102  --   BILITOT 0.9 1.2  --   PROT 6.5 6.4*  --   ALBUMIN 3.3* 3.2* 3.1*   No results for input(s): LIPASE, AMYLASE in  the last 168 hours. No results for input(s): AMMONIA in the last 168 hours. Diabetic: No results for input(s): HGBA1C in the last 72 hours. No results for input(s): GLUCAP in the last 168 hours. Cardiac Enzymes: No results for input(s): CKTOTAL, CKMB, CKMBINDEX, TROPONINI in the last 168 hours. No results for input(s): PROBNP in the last 8760 hours. Coagulation Profile: No results for input(s): INR, PROTIME in the last 168 hours. Thyroid Function Tests: No results for input(s): TSH, T4TOTAL, FREET4, T3FREE, THYROIDAB in the last 72 hours. Lipid Profile: No results for input(s): CHOL, HDL, LDLCALC, TRIG, CHOLHDL, LDLDIRECT in the last 72 hours. Anemia Panel: Recent Labs    06/20/19 1310  VITAMINB12 1,217*  FOLATE 10.1  FERRITIN 535*  TIBC 209*  IRON 25*  RETICCTPCT 1.7   Urine analysis:    Component Value Date/Time   COLORURINE STRAW (A) 01/22/2019 1930   APPEARANCEUR CLEAR 01/22/2019 1930   LABSPEC 1.006 01/22/2019 1930   PHURINE 7.0 01/22/2019 1930   GLUCOSEU NEGATIVE 01/22/2019 1930   HGBUR NEGATIVE 01/22/2019 1930   [place] NEGATIVE 01/22/2019 1930   [place] NEGATIVE 01/22/2019 1930   PROTEINUR NEGATIVE 01/22/2019 1930   NITRITE NEGATIVE 01/22/2019 1930   LEUKOCYTESUR NEGATIVE 01/22/2019 1930   Sepsis Labs: Invalid input(s): PROCALCITONIN, Cyril  Microbiology: Recent Results (from the past 240 hour(s))  Respiratory Panel by RT PCR (Flu A&B, Covid) - Nasopharyngeal Swab     Status: None   Collection Time: 06/18/19  3:21 PM   Specimen: Nasopharyngeal Swab  Result Value Ref Range Status   SARS Coronavirus 2 by RT PCR NEGATIVE NEGATIVE Final    Comment: (NOTE) SARS-CoV-2 target nucleic acids are NOT DETECTED. The SARS-CoV-2 RNA is generally detectable in upper respiratoy specimens during the acute phase of infection. The lowest concentration of SARS-CoV-2 viral copies this assay can detect is 131 copies/mL. A negative result does not preclude  SARS-Cov-2 infection and should not be used as the sole basis for treatment or other patient management decisions. A negative result may occur with  improper specimen collection/handling, submission of specimen other than nasopharyngeal swab, presence of viral mutation(s) within the areas targeted by this assay, and inadequate number of viral copies (<131 copies/mL). A negative result must be combined with clinical observations, patient history, and epidemiological information. The expected result is Negative. Fact Sheet for Patients:  PinkCheek.be Fact Sheet for Healthcare Providers:  GravelBags.it This test is not yet  ap proved or cleared by the [place] and  has been authorized for detection and/or diagnosis of SARS-CoV-2 by FDA under an Emergency Use Authorization (EUA). This EUA will remain  in effect (meaning this test can be used) for the duration of the COVID-19 declaration under Section 564(b)(1) of the Act, 21 U.S.C. section 360bbb-3(b)(1), unless the authorization is terminated or revoked sooner.    Influenza A by PCR NEGATIVE NEGATIVE Final   Influenza B by PCR NEGATIVE NEGATIVE Final    Comment: (NOTE) The Xpert Xpress SARS-CoV-2/FLU/RSV assay is intended as an aid in  the diagnosis of influenza from Nasopharyngeal swab specimens and  should not be used as a sole basis for treatment. Nasal washings and  aspirates are unacceptable for Xpert Xpress SARS-CoV-2/FLU/RSV  testing. Fact Sheet for Patients: PinkCheek.be Fact Sheet for Healthcare Providers: GravelBags.it This test is not yet approved or cleared by the [place] FDA and  has been authorized for detection and/or diagnosis of SARS-CoV-2 by  FDA under an Emergency Use Authorization (EUA). This EUA will remain  in effect (meaning this test can be used) for the duration of the  Covid-19  declaration under Section 564(b)(1) of the Act, 21  U.S.C. section 360bbb-3(b)(1), unless the authorization is  terminated or revoked. Performed at [facility], [place] [address], [place], [place] [address]     Radiology Studies: US RENAL  Result Date: 06/21/2019 CLINICAL DATA:  Acute kidney injury EXAM: RENAL / URINARY TRACT ULTRASOUND COMPLETE COMPARISON:  None. FINDINGS: Right Kidney: Surgically absent.  No lesions seen in right pararenal fossa region. Left Kidney: Renal measurements: 9.4 x 3.7 x 4.5 cm = volume: 83.0 mL. Echogenicity and renal cortical thickness are within normal limits. No mass, perinephric fluid, or hydronephrosis visualized. No sonographically demonstrable calculus or ureterectasis. Bladder: Appears normal for degree of bladder distention. Other: None. IMPRESSION: Normal appearing left kidney.  Right kidney absent. Electronically Signed   By: Lowella Grip III M.D.   On: 06/21/2019 10:38      Holly Bolton  If 7PM-7AM, please contact night-coverage [email or web address] Password [facility] 06/21/2019, 1:57 PM

## 2019-06-21 NOTE — Progress Notes (Signed)
Progress Note  Patient Name: Holly Bolton Date of Encounter: 06/21/2019  Primary Cardiologist: Sanda Klein, MD   Subjective   86 year old female with a history of coronary artery disease-status post single-vessel CABG in 1999.  He has a history of bicuspid aortic valve and is status post aortic valve replacement with ascending aortic aneurysm repair.  She has chronic combined systolic and diastolic congestive heart failure with a current EF of 45 to 50%.  She has grade 2 diastolic dysfunction.  She has a pacemaker, hypertension, hyperlipidemia, CKD stage III.  She has moderate pulmonary hypertension with an estimated PA pressure of 67 mmHg.  She is admitted with a right hip fracture.  She is developed some acute renal insufficiency.  Her creatinine is up to 3.04 with a baseline of around 1.5.  Normal saline at 60 cc/h has been ordered as of this morning.  Inpatient Medications    Scheduled Meds: . amiodarone  200 mg Oral Daily  . aspirin  81 mg Oral Daily  . brimonidine  1 drop Both Eyes BID  . carvedilol  3.125 mg Oral BID WC  . cholecalciferol  2,000 Units Oral QHS  . docusate sodium  100 mg Oral BID  . heparin injection (subcutaneous)  5,000 Units Subcutaneous Q8H  . sodium chloride flush  3 mL Intravenous Q12H  . sodium chloride flush  3 mL Intravenous Q12H  . vitamin B-12  500 mcg Oral Daily   Continuous Infusions: . sodium chloride    . sodium chloride    . sodium chloride     PRN Meds: sodium chloride, acetaminophen **AND** diphenhydrAMINE, acetaminophen, albuterol, haloperidol lactate, HYDROcodone-acetaminophen, HYDROcodone-acetaminophen, HYDROmorphone (DILAUDID) injection, menthol-cetylpyridinium **OR** phenol, metoCLOPramide **OR** metoCLOPramide (REGLAN) injection, ondansetron **OR** ondansetron (ZOFRAN) IV, polyethylene glycol, sodium chloride flush, traMADol   Vital Signs    Vitals:   06/20/19 1247 06/20/19 1254 06/20/19 2038 06/21/19 0417  BP: (!) 109/48 (!)  123/47 124/61 (!) 131/51  Pulse: 62 64 61 86  Resp: 17   18  Temp: 97.8 F (36.6 C) 98.2 F (36.8 C) 98 F (36.7 C) 98.2 F (36.8 C)  TempSrc: Oral Oral Oral Oral  SpO2: 95% 97%  92%  Weight:    40.6 kg  Height:        Intake/Output Summary (Last 24 hours) at 06/21/2019 0240 Last data filed at 06/21/2019 0600 Gross per 24 hour  Intake 350 ml  Output 0 ml  Net 350 ml   Last 3 Weights 06/21/2019 06/20/2019 06/19/2019  Weight (lbs) 89 lb 6.4 oz 89 lb 4.8 oz 85 lb 1.6 oz  Weight (kg) 40.552 kg 40.506 kg 38.6 kg      Telemetry    V paced - Personally Reviewed  ECG     - Personally Reviewed  Physical Exam   GEN:  frail, chroniclly ill appearing female,  Elderly , did not speak this am .   Discussed with her son    Neck: No JVD Cardiac: RRR, no murmurs, rubs, or gallops.  Respiratory: Clear to auscultation bilaterally. GI: Soft, nontender, non-distended  MS: No edema;  Reduced skin turgur  Neuro:   did not speak to me this am .  Very lethargic   Psych: Normal affect   Labs    High Sensitivity Troponin:  No results for input(s): TROPONINIHS in the last 720 hours.    Chemistry Recent Labs  Lab 06/18/19 1048 06/18/19 1048 06/19/19 0508 06/20/19 0442 06/21/19 0603  NA 141   < >  143 137 134*  K 4.4   < > 4.7 4.3 4.5  CL 104   < > 103 101 98  CO2 27   < > 28 26 25   GLUCOSE 129*   < > 119* 110* 121*  BUN 45*   < > 48* 58* 69*  CREATININE 2.01*   < > 2.01* 2.63* 3.04*  CALCIUM 8.7*   < > 8.5* 8.4* 8.4*  PROT 6.5  --  6.4*  --   --   ALBUMIN 3.3*  --  3.2*  --  3.1*  AST 23  --  20  --   --   ALT 20  --  22  --   --   ALKPHOS 102  --  102  --   --   BILITOT 0.9  --  1.2  --   --   GFRNONAA 21*   < > 21* 15* 13*  GFRAA 24*   < > 24* 17* 15*  ANIONGAP 10   < > 12 10 11    < > = values in this interval not displayed.     Hematology Recent Labs  Lab 06/19/19 0508 06/19/19 0508 06/20/19 0442 06/20/19 1310 06/21/19 0603  WBC 10.4  --  14.1*  --  11.1*  RBC 3.10*    < > 2.68* 2.56* 2.66*  HGB 10.3*  --  9.0*  --  8.7*  HCT 32.9*  --  29.0*  --  27.0*  MCV 106.1*  --  108.2*  --  101.5*  MCH 33.2  --  33.6  --  32.7  MCHC 31.3  --  31.0  --  32.2  RDW 17.2*  --  17.2*  --  17.0*  PLT 208  --  189  --  198   < > = values in this interval not displayed.    BNP Recent Labs  Lab 06/18/19 1048  BNP 941.2*     DDimer No results for input(s): DDIMER in the last 168 hours.   Radiology    DG C-Arm 1-60 Min-No Report  Result Date: 06/19/2019 Fluoroscopy was utilized by the requesting physician.  No radiographic interpretation.   DG FEMUR 1V RIGHT  Result Date: 06/19/2019 CLINICAL DATA:  Right hip ORIF EXAM: RIGHT FEMUR 1 VIEW; DG C-ARM 1-60 MIN-NO REPORT COMPARISON:  06/18/2019 FINDINGS: 5 C-arm fluoroscopic images were obtained intraoperatively and submitted for post operative interpretation. Images demonstrate interval placement of anterograde short intramedullary rod with single distal interlocking screw and proximal lag screw fixating a nondisplaced intertrochanteric fracture of the proximal right femur. Alignment is anatomic. Osteoarthritis of the right hip. 28 seconds of fluoroscopy time was utilized. Please see the performing provider's procedural report for further detail. IMPRESSION: As above. Electronically Signed   By: Davina Poke D.O.   On: 06/19/2019 12:46    Cardiac Studies      Patient Profile     86 y.o. female  With multiple cardiology issues - CAD, AVR, pacer,  Admitted with hip fracture. Has developed acute on chronic renal insufficiency following surgery   Assessment & Plan    1.  Volume depletion: Her main issue this morning is on depletion.  She has acute renal failure.  She is very lethargic.  She has been ordered to receive IV normal saline but she needs to have an IV placed.  We will continue to follow her.  I think that she would definitely benefit with some IV fluids.  2.  History  of congestive heart failure: Her  ejection fraction is 45 to 50%.  She does have some diastolic dysfunction.  I think I think she will tolerate the IV normal saline well.  We will watch her closely.  3.  History of coronary artery disease.  She denies any angina.  4.  Pulmonary hypertension: Continue to follow.  5.  Acute on chronic renal insufficiency: Her creatinine today is above 3.  Agree with giving her normal saline.  We will need to follow this closely.    She is at risk of developing CHF with IV normal saline but we will watch her closely.      For questions or updates, please contact [place] Please consult [email or web address] for contact info under        Signed, Mertie Moores, MD  06/21/2019, 9:09 AM

## 2019-06-22 ENCOUNTER — Inpatient Hospital Stay (HOSPITAL_COMMUNITY): Payer: Medicare Other

## 2019-06-22 ENCOUNTER — Encounter: Payer: Self-pay | Admitting: *Deleted

## 2019-06-22 DIAGNOSIS — Z7189 Other specified counseling: Secondary | ICD-10-CM

## 2019-06-22 DIAGNOSIS — I5022 Chronic systolic (congestive) heart failure: Secondary | ICD-10-CM | POA: Diagnosis not present

## 2019-06-22 LAB — RENAL FUNCTION PANEL
Albumin: 2.9 g/dL — ABNORMAL LOW (ref 3.5–5.0)
Anion gap: 10 (ref 5–15)
BUN: 72 mg/dL — ABNORMAL HIGH (ref 8–23)
CO2: 24 mmol/L (ref 22–32)
Calcium: 8.2 mg/dL — ABNORMAL LOW (ref 8.9–10.3)
Chloride: 103 mmol/L (ref 98–111)
Creatinine, Ser: 2.68 mg/dL — ABNORMAL HIGH (ref 0.44–1.00)
GFR calc Af Amer: 17 mL/min — ABNORMAL LOW (ref >60)
GFR calc non Af Amer: 15 mL/min — ABNORMAL LOW (ref >60)
Glucose, Bld: 111 mg/dL — ABNORMAL HIGH (ref 70–99)
Phosphorus: 4.6 mg/dL (ref 2.5–4.6)
Potassium: 4.7 mmol/L (ref 3.5–5.1)
Sodium: 137 mmol/L (ref 135–145)

## 2019-06-22 LAB — CBC
HCT: 26.7 % — ABNORMAL LOW (ref 36.0–46.0)
Hemoglobin: 8.7 g/dL — ABNORMAL LOW (ref 12.0–15.0)
MCH: 33.1 pg (ref 26.0–34.0)
MCHC: 32.6 g/dL (ref 30.0–36.0)
MCV: 101.5 fL — ABNORMAL HIGH (ref 80.0–100.0)
Platelets: 223 10*3/uL (ref 150–400)
RBC: 2.63 MIL/uL — ABNORMAL LOW (ref 3.87–5.11)
RDW: 17.2 % — ABNORMAL HIGH (ref 11.5–15.5)
WBC: 9.4 10*3/uL (ref 4.0–10.5)
nRBC: 0.6 % — ABNORMAL HIGH (ref 0.0–0.2)

## 2019-06-22 LAB — BRAIN NATRIURETIC PEPTIDE: B Natriuretic Peptide: 1694.1 pg/mL — ABNORMAL HIGH (ref 0.0–100.0)

## 2019-06-22 LAB — TSH: TSH: 0.544 u[IU]/mL (ref 0.350–4.500)

## 2019-06-22 LAB — MAGNESIUM: Magnesium: 2.3 mg/dL (ref 1.7–2.4)

## 2019-06-22 LAB — AMMONIA: Ammonia: 18 umol/L (ref 9–35)

## 2019-06-22 MED ORDER — LORAZEPAM 2 MG/ML IJ SOLN
0.5000 mg | Freq: Two times a day (BID) | INTRAMUSCULAR | Status: DC | PRN
  Administered 2019-06-22 – 2019-06-23 (×2): 0.5 mg via INTRAVENOUS
  Filled 2019-06-22 (×2): qty 1

## 2019-06-22 MED ORDER — SODIUM CHLORIDE 0.9 % IV SOLN: INTRAVENOUS | Status: DC

## 2019-06-22 MED ORDER — OXYCODONE HCL 5 MG PO TABS
2.5000 mg | ORAL_TABLET | Freq: Four times a day (QID) | ORAL | Status: DC | PRN
  Administered 2019-06-22: 2.5 mg via ORAL
  Filled 2019-06-22: qty 1

## 2019-06-22 MED ORDER — OXYCODONE HCL 5 MG PO TABS
2.5000 mg | ORAL_TABLET | Freq: Two times a day (BID) | ORAL | Status: DC | PRN
  Administered 2019-06-23 (×2): 2.5 mg via ORAL
  Filled 2019-06-22 (×2): qty 1

## 2019-06-22 NOTE — Progress Notes (Signed)
PROGRESS NOTE  Holly Bolton UYQ:[id number] DOB: 1922-03-11   PCP: Gayland Curry, DO  Patient is from: Home.  Lives with his son.  Uses cane at baseline.  DOA: 06/18/2019 LOS: 4  Brief Narrative / Interim history: 86 year old with history of atrial flutter/fibrillation on amiodarone not on anticoagulation, chronic systolic CHF, CAD status post CABG, status post AVR, PAD, aortic aneurysm repair, pacemaker for sick sinus syndrome, second-degree AV block, hypothyroidism, CKD stage IV admitted after a fall and right hip pain.  Trauma work-up including CT head and CT cervical spine negative.  Showed right apical pneumothorax.  CT chest showed large left-sided and small right-sided pleural effusion, mild T3 compression fracture.  CT of the right hip showed right femur fracture.  Orthopedic consulted. She underwent intramedullary nailing on 5/6.   Hospital course complicated by acute respiratory failure thought to be due to CHF exacerbation after surgery.  Cardiology consulted.  Attempted to diurese with p.o. Lasix but did not tolerate due to renal function.  Lasix discontinued.  Started on IV fluid.  Cardiology following.  Subjective: Seen and examined earlier this morning.  She is a sleepy but does not seem to be in distress.  Patient son at bedside.  Still on IV normal saline.  Minimal urine output.  Objective: Vitals:   06/21/19 2046 06/22/19 0631 06/22/19 0810 06/22/19 0813  BP: (!) 129/57 129/65 131/61   Pulse: 84 87  82  Resp: 20 (!) 24    Temp: (!) 97.5 F (36.4 C) 97.6 F (36.4 C)    TempSrc: Axillary Oral    SpO2: 91% 92% 100%   Weight:      Height:        Intake/Output Summary (Last 24 hours) at 06/22/2019 1025 Last data filed at 06/22/2019 0500 Gross per 24 hour  Intake 1808.5 ml  Output 110 ml  Net 1698.5 ml   Filed Weights   06/19/19 0500 06/20/19 0500 06/21/19 0417  Weight: 38.6 kg 40.5 kg 40.6 kg    Examination:  GENERAL: Frail elderly female.  No apparent distress.   Nontoxic. NECK: Supple.  No apparent JVD.  RESP: 92% on 2 L.  No IWOB.  Fair aeration bilaterally. CVS:  RRR. Heart sounds normal.  ABD/GI/GU: Bowel sounds present. Soft. Non tender.  MSK/EXT:  Moves extremities.  Global muscle mass and subcu fat loss.  No edema. SKIN: no apparent skin lesion or wound NEURO: Sleepy. No apparent focal neuro deficit. PSYCH: Calm and sleepy.  Procedures:  5/6-intramedullary nailing of right femoral fracture  Microbiology summarized: Influenza PCR negative. COVID-19 PCR negative.  Assessment & Plan: Mechanical fall leading to closed right intertrochanteric fracture -IM nailing of right femoral fracture by orthopedic surgery on 5/6 -PT/OT and  WBAT.  -VTE PPx and pain control per Ortho. -Bowel regimen  Acute respiratory failure with hypoxia: Improved. -Wean oxygen as able.  Minimal oxygen to maintain saturation above 90%. -Encourage incentive spirometry -OOB/PT/OT  Small right apical pneumothorax-resolved on subsequent chest x-ray. -Conservative care per pulmonology  Chronic bilateral left greater than right pleural effusion-unclear etiology. No signs of fluid overload. -Repeat CXR today. -May need diagnostic+/-therapeutic thoracocentesis  Chronic combined CHF: Echo 4/16-EF 45-50%, grade 2 DD. Appears euvolemic. On p.o. Lasix 20 mg daily at home. BNP 941 but lower than previous values. Now worsening renal function even of p.o. Lasix. -Continue holding Lasix -Continue NS at 60 cc/hour for the next 24 hours -Monitor fluid status and renal function -Liberate fluid intake. -Appreciate guidance by cardiology  Chronic CAD/PAD: No anginal symptoms. -Continue home meds.  AKI on CKD-4/azotemia: Baseline Cr~1.5>2.01 (admit)>> 3.04> 2.68. BUN 45>>> 72. FENa 0.2% suggesting prerenal etilogy.  Renal ultrasound without significant finding. -Continue holding Lasix. -Continue IV fluid as above  Anemia of renal disease: Baseline Hgb 9-10> 10.3  (admit)> 9.0> 8.7.  Anemia panel consistent with ACD. -Continue monitoring  Persistent atrial fibrillation: Rate controlled. -Continue home amiodarone -Not on anticoagulation due to fall risk  Prolonged QTc-exaggerated by wide QRS/LBBB. -Optimize electrolytes  Debility/physical deconditioning -PT/OT  SSS s/p PPM  Aortic aneurysm status post repair  Goal of care/DNR/DNI-appreciate palliative care input. -Patient to follow-up outpatient.  Severe protein calorie malnutrition/FTT:Body mass index is 15.84 kg/m. Significant muscle mass and subcu fat loss. -Dietitian consulted.         DVT prophylaxis: Start subcu heparin Code Status: DNR/DNI Family Communication: Updated patient's son at bedside.  Status is: Inpatient  Remains inpatient appropriate because: AKI/dehydration in the setting of poor p.o. intake requiring IV fluid, and respiratory failure requiring supplemental oxygen   Dispo: The patient is from: Home              Anticipated d/c is to: SNF              Anticipated d/c date is: 2 days              Patient currently is not medically stable to d/c.       Consultants:  Orthopedic surgery Palliative care Cardiology   Sch Meds:  Scheduled Meds: . acetaminophen  1,000 mg Oral Q8H  . amiodarone  200 mg Oral Daily  . aspirin  81 mg Oral Daily  . brimonidine  1 drop Both Eyes BID  . carvedilol  3.125 mg Oral BID WC  . cholecalciferol  2,000 Units Oral QHS  . docusate sodium  100 mg Oral BID  . heparin injection (subcutaneous)  5,000 Units Subcutaneous Q8H  . sodium chloride flush  3 mL Intravenous Q12H  . sodium chloride flush  3 mL Intravenous Q12H  . vitamin B-12  500 mcg Oral Daily   Continuous Infusions: . sodium chloride    . sodium chloride 60 mL/hr at 06/22/19 0808  . sodium chloride     PRN Meds:.sodium chloride, albuterol, clonazepam, haloperidol lactate, HYDROmorphone (DILAUDID) injection, menthol-cetylpyridinium **OR** phenol,  ondansetron **OR** ondansetron (ZOFRAN) IV, oxyCODONE, polyethylene glycol, sodium chloride flush  Antimicrobials: Anti-infectives (From admission, onward)   Start     Dose/Rate Route Frequency Ordered Stop   06/19/19 1043  clindamycin (CLEOCIN) 900 MG/50ML IVPB    Note to Pharmacy: Bridget Hartshorn   : cabinet override      06/19/19 1043 06/19/19 1049   06/19/19 0845  clindamycin (CLEOCIN) IVPB 900 mg     900 mg 100 mL/hr over 30 Minutes Intravenous On call to O.R. 06/19/19 0748 06/19/19 1103       I have personally reviewed the following labs and images: CBC: Recent Labs  Lab 06/18/19 1048 06/19/19 0508 06/20/19 0442 06/21/19 0603 06/22/19 0502  WBC 7.4 10.4 14.1* 11.1* 9.4  NEUTROABS 5.4  --   --   --   --   HGB 10.4* 10.3* 9.0* 8.7* 8.7*  HCT 33.1* 32.9* 29.0* 27.0* 26.7*  MCV 105.1* 106.1* 108.2* 101.5* 101.5*  PLT 221 208 189 198 223   BMP &GFR Recent Labs  Lab 06/18/19 1048 06/19/19 0508 06/20/19 0442 06/21/19 0603 06/22/19 0502  NA 141 143 137 134* 137  K 4.4 4.7  4.3 4.5 4.7  CL 104 103 101 98 103  CO2 27 28 26 25 24   GLUCOSE 129* 119* 110* 121* 111*  BUN 45* 48* 58* 69* 72*  CREATININE 2.01* 2.01* 2.63* 3.04* 2.68*  CALCIUM 8.7* 8.5* 8.4* 8.4* 8.2*  MG  --   --   --  2.1 2.3  PHOS  --   --   --  4.7* 4.6   Estimated Creatinine Clearance: 8.2 mL/min (A) (by C-G formula based on SCr of 2.68 mg/dL (H)). Liver & Pancreas: Recent Labs  Lab 06/18/19 1048 06/19/19 0508 06/21/19 0603 06/22/19 0502  AST 23 20  --   --   ALT 20 22  --   --   ALKPHOS 102 102  --   --   BILITOT 0.9 1.2  --   --   PROT 6.5 6.4*  --   --   ALBUMIN 3.3* 3.2* 3.1* 2.9*   No results for input(s): LIPASE, AMYLASE in the last 168 hours. No results for input(s): AMMONIA in the last 168 hours. Diabetic: No results for input(s): HGBA1C in the last 72 hours. No results for input(s): GLUCAP in the last 168 hours. Cardiac Enzymes: No results for input(s): CKTOTAL, CKMB,  CKMBINDEX, TROPONINI in the last 168 hours. No results for input(s): PROBNP in the last 8760 hours. Coagulation Profile: No results for input(s): INR, PROTIME in the last 168 hours. Thyroid Function Tests: No results for input(s): TSH, T4TOTAL, FREET4, T3FREE, THYROIDAB in the last 72 hours. Lipid Profile: No results for input(s): CHOL, HDL, LDLCALC, TRIG, CHOLHDL, LDLDIRECT in the last 72 hours. Anemia Panel: Recent Labs    06/20/19 1310  VITAMINB12 1,217*  FOLATE 10.1  FERRITIN 535*  TIBC 209*  IRON 25*  RETICCTPCT 1.7   Urine analysis:    Component Value Date/Time   COLORURINE STRAW (A) 01/22/2019 1930   APPEARANCEUR CLEAR 01/22/2019 1930   LABSPEC 1.006 01/22/2019 1930   PHURINE 7.0 01/22/2019 1930   GLUCOSEU NEGATIVE 01/22/2019 1930   HGBUR NEGATIVE 01/22/2019 1930   [place] NEGATIVE 01/22/2019 1930   [place] NEGATIVE 01/22/2019 1930   PROTEINUR NEGATIVE 01/22/2019 1930   NITRITE NEGATIVE 01/22/2019 1930   LEUKOCYTESUR NEGATIVE 01/22/2019 1930   Sepsis Labs: Invalid input(s): PROCALCITONIN, [place]  Microbiology: Recent Results (from the past 240 hour(s))  Respiratory Panel by RT PCR (Flu A&B, Covid) - Nasopharyngeal Swab     Status: None   Collection Time: 06/18/19  3:21 PM   Specimen: Nasopharyngeal Swab  Result Value Ref Range Status   SARS Coronavirus 2 by RT PCR NEGATIVE NEGATIVE Final    Comment: (NOTE) SARS-CoV-2 target nucleic acids are NOT DETECTED. The SARS-CoV-2 RNA is generally detectable in upper respiratoy specimens during the acute phase of infection. The lowest concentration of SARS-CoV-2 viral copies this assay can detect is 131 copies/mL. A negative result does not preclude SARS-Cov-2 infection and should not be used as the sole basis for treatment or other patient management decisions. A negative result may occur with  improper specimen collection/handling, submission of specimen other than nasopharyngeal swab, presence of viral  mutation(s) within the areas targeted by this assay, and inadequate number of viral copies (<131 copies/mL). A negative result must be combined with clinical observations, patient history, and epidemiological information. The expected result is Negative. Fact Sheet for Patients:  PinkCheek.be Fact Sheet for Healthcare Providers:  GravelBags.it This test is not yet ap proved or cleared by the [place] and  has been authorized  for detection and/or diagnosis of SARS-CoV-2 by FDA under an Emergency Use Authorization (EUA). This EUA will remain  in effect (meaning this test can be used) for the duration of the COVID-19 declaration under Section 564(b)(1) of the Act, 21 U.S.C. section 360bbb-3(b)(1), unless the authorization is terminated or revoked sooner.    Influenza A by PCR NEGATIVE NEGATIVE Final   Influenza B by PCR NEGATIVE NEGATIVE Final    Comment: (NOTE) The Xpert Xpress SARS-CoV-2/FLU/RSV assay is intended as an aid in  the diagnosis of influenza from Nasopharyngeal swab specimens and  should not be used as a sole basis for treatment. Nasal washings and  aspirates are unacceptable for Xpert Xpress SARS-CoV-2/FLU/RSV  testing. Fact Sheet for Patients: PinkCheek.be Fact Sheet for Healthcare Providers: GravelBags.it This test is not yet approved or cleared by the [place] FDA and  has been authorized for detection and/or diagnosis of SARS-CoV-2 by  FDA under an Emergency Use Authorization (EUA). This EUA will remain  in effect (meaning this test can be used) for the duration of the  Covid-19 declaration under Section 564(b)(1) of the Act, 21  U.S.C. section 360bbb-3(b)(1), unless the authorization is  terminated or revoked. Performed at [facility], [place] [address], [place], [place] [address]     Radiology Studies: No results  found.    Vershawn Westrup T. [place]  If 7PM-7AM, please contact night-coverage [email or web address] Password [facility] 06/22/2019, 10:25 AM

## 2019-06-22 NOTE — Progress Notes (Signed)
Patient more agitated; yelling out for help at this time; Vital Signs are stable; PRN medication administered for agitation per [facility].

## 2019-06-22 NOTE — Progress Notes (Signed)
Assumed care of the patient. Patient is resting comfortably in bed. No signs or symptoms of acute distress. Will continue to monitor.

## 2019-06-22 NOTE — Progress Notes (Signed)
Progress Note  Patient Name: Holly Bolton Date of Encounter: 06/22/2019  Primary Cardiologist: Sanda Klein, MD   Subjective   86 year old female with a history of coronary artery disease-status post single-vessel CABG in 1999.  He has a history of bicuspid aortic valve and is status post aortic valve replacement with ascending aortic aneurysm repair.  She has chronic combined systolic and diastolic congestive heart failure with a current EF of 45 to 50%.  She has grade 2 diastolic dysfunction.  She has a pacemaker, hypertension, hyperlipidemia, CKD stage III.  She has moderate pulmonary hypertension with an estimated PA pressure of 67 mmHg.  She is admitted with a right hip fracture. Marland Kitchen She was volume depleted on exam yesterday.  She is received IV normal saline.  Her creatinine has improved slightly to 2.68.   Inpatient Medications    Scheduled Meds: . acetaminophen  1,000 mg Oral Q8H  . amiodarone  200 mg Oral Daily  . aspirin  81 mg Oral Daily  . brimonidine  1 drop Both Eyes BID  . carvedilol  3.125 mg Oral BID WC  . cholecalciferol  2,000 Units Oral QHS  . docusate sodium  100 mg Oral BID  . heparin injection (subcutaneous)  5,000 Units Subcutaneous Q8H  . sodium chloride flush  3 mL Intravenous Q12H  . sodium chloride flush  3 mL Intravenous Q12H  . vitamin B-12  500 mcg Oral Daily   Continuous Infusions: . sodium chloride    . sodium chloride 60 mL/hr at 06/22/19 0808  . sodium chloride     PRN Meds: sodium chloride, albuterol, clonazepam, haloperidol lactate, HYDROmorphone (DILAUDID) injection, menthol-cetylpyridinium **OR** phenol, ondansetron **OR** ondansetron (ZOFRAN) IV, oxyCODONE, polyethylene glycol, sodium chloride flush   Vital Signs    Vitals:   06/21/19 2046 06/22/19 0631 06/22/19 0810 06/22/19 0813  BP: (!) 129/57 129/65 131/61   Pulse: 84 87  82  Resp: 20 (!) 24    Temp: (!) 97.5 F (36.4 C) 97.6 F (36.4 C)    TempSrc: Axillary Oral    SpO2:  91% 92% 100%   Weight:      Height:        Intake/Output Summary (Last 24 hours) at 06/22/2019 0903 Last data filed at 06/21/2019 1700 Gross per 24 hour  Intake 50 ml  Output 110 ml  Net -60 ml   Last 3 Weights 06/21/2019 06/20/2019 06/19/2019  Weight (lbs) 89 lb 6.4 oz 89 lb 4.8 oz 85 lb 1.6 oz  Weight (kg) 40.552 kg 40.506 kg 38.6 kg      Telemetry    AV pacing -  Personally Reviewed  ECG     - Personally Reviewed  Physical Exam   Physical Exam: Blood pressure 131/61, pulse 82, temperature 97.6 F (36.4 C), temperature source Oral, resp. rate (!) 24, height 5\' 3"  (1.6 m), weight 40.6 kg, SpO2 100 %.  GEN:   Elderly , frail female  HEENT: Normal NECK: No JVD; No carotid bruits LYMPHATICS: No lymphadenopathy CARDIAC:  RR  RESPIRATORY:  Clear to auscultation without rales, wheezing or rhonchi  ABDOMEN: Soft, non-tender, non-distended MUSCULOSKELETAL:  No edema; No deformity  SKIN: Warm and dry NEUROLOGIC:  Alert and oriented x 3   Labs    High Sensitivity Troponin:  No results for input(s): TROPONINIHS in the last 720 hours.    Chemistry Recent Labs  Lab 06/18/19 1048 06/18/19 1048 06/19/19 0508 06/19/19 0508 06/20/19 0442 06/21/19 0603 06/22/19 0502  NA 141   < >  143   < > 137 134* 137  K 4.4   < > 4.7   < > 4.3 4.5 4.7  CL 104   < > 103   < > 101 98 103  CO2 27   < > 28   < > 26 25 24   GLUCOSE 129*   < > 119*   < > 110* 121* 111*  BUN 45*   < > 48*   < > 58* 69* 72*  CREATININE 2.01*   < > 2.01*   < > 2.63* 3.04* 2.68*  CALCIUM 8.7*   < > 8.5*   < > 8.4* 8.4* 8.2*  PROT 6.5  --  6.4*  --   --   --   --   ALBUMIN 3.3*   < > 3.2*  --   --  3.1* 2.9*  AST 23  --  20  --   --   --   --   ALT 20  --  22  --   --   --   --   ALKPHOS 102  --  102  --   --   --   --   BILITOT 0.9  --  1.2  --   --   --   --   GFRNONAA 21*   < > 21*   < > 15* 13* 15*  GFRAA 24*   < > 24*   < > 17* 15* 17*  ANIONGAP 10   < > 12   < > 10 11 10    < > = values in this interval not  displayed.     Hematology Recent Labs  Lab 06/20/19 0442 06/20/19 1310 06/21/19 0603 06/22/19 0502  WBC 14.1*  --  11.1* 9.4  RBC 2.68* 2.56* 2.66* 2.63*  HGB 9.0*  --  8.7* 8.7*  HCT 29.0*  --  27.0* 26.7*  MCV 108.2*  --  101.5* 101.5*  MCH 33.6  --  32.7 33.1  MCHC 31.0  --  32.2 32.6  RDW 17.2*  --  17.0* 17.2*  PLT 189  --  198 223    BNP Recent Labs  Lab 06/18/19 1048  BNP 941.2*     DDimer No results for input(s): DDIMER in the last 168 hours.   Radiology    US RENAL  Result Date: 06/21/2019 CLINICAL DATA:  Acute kidney injury EXAM: RENAL / URINARY TRACT ULTRASOUND COMPLETE COMPARISON:  None. FINDINGS: Right Kidney: Surgically absent.  No lesions seen in right pararenal fossa region. Left Kidney: Renal measurements: 9.4 x 3.7 x 4.5 cm = volume: 83.0 mL. Echogenicity and renal cortical thickness are within normal limits. No mass, perinephric fluid, or hydronephrosis visualized. No sonographically demonstrable calculus or ureterectasis. Bladder: Appears normal for degree of bladder distention. Other: None. IMPRESSION: Normal appearing left kidney.  Right kidney absent. Electronically Signed   By: Lowella Grip III M.D.   On: 06/21/2019 10:38    Cardiac Studies      Patient Profile     86 y.o. female  With multiple cardiology issues - CAD, AVR, pacer,  Admitted with hip fracture. Has developed acute on chronic renal insufficiency following surgery   Assessment & Plan    1.  Volume depletion: she seems to be better.  Creatinine has improved.   Urine is still very dark and concentrated.   Cont IV NS for now .   2.  History of congestive heart failure:  She has  mildly reduced left ventricular systolic function.  We will continue to follow her.  She is tolerating the IV fluid well so far.  3.  History of coronary artery disease.  She denies having any angina.  4.  Pulmonary hypertension: Continue current medications.  5.  Acute on chronic renal  insufficiency: Creatinine has improved slightly.  Further plans per the [facility] hospitalist team.      For questions or updates, please contact [place] Please consult [email or web address] for contact info under        Signed, Mertie Moores, MD  06/22/2019, 9:03 AM

## 2019-06-22 NOTE — Progress Notes (Signed)
    Patient doing well PO day 3 S/P R IM nail, pain well controlled, very sedated on exam today and drowsy, has been up to toilet, WBAT, slow progress in PT, awaiting SNF placement. Pt continues to be mildly demented and irritable. Son is not at bedside today.   Physical Exam: Vitals:   06/22/19 0810 06/22/19 0813  BP: 131/61   Pulse:  82  Resp:    Temp:    SpO2: 100%    CBC Latest Ref Rng & Units 06/22/2019 06/21/2019 06/20/2019  WBC 4.0 - 10.5 K/uL 9.4 11.1(H) 14.1(H)  Hemoglobin 12.0 - 15.0 g/dL 8.7(L) 8.7(L) 9.0(L)  Hematocrit 36.0 - 46.0 % 26.7(L) 27.0(L) 29.0(L)  Platelets 150 - 400 K/uL 223 198 189   CMP Latest Ref Rng & Units 06/22/2019 06/21/2019 06/20/2019  Glucose 70 - 99 mg/dL 111(H) 121(H) 110(H)  BUN 8 - 23 mg/dL 72(H) 69(H) 58(H)  Creatinine 0.44 - 1.00 mg/dL 2.68(H) 3.04(H) 2.63(H)  Sodium 135 - 145 mmol/L 137 134(L) 137  Potassium 3.5 - 5.1 mmol/L 4.7 4.5 4.3  Chloride 98 - 111 mmol/L 103 98 101  CO2 22 - 32 mmol/L 24 25 26   Calcium 8.9 - 10.3 mg/dL 8.2(L) 8.4(L) 8.4(L)  Total Protein 6.5 - 8.1 g/dL - - -  Total Bilirubin 0.3 - 1.2 mg/dL - - -  Alkaline Phos 38 - 126 U/L - - -  AST 15 - 41 U/L - - -  ALT 0 - 44 U/L - - -   Hg stable   Dressing in place, CDI, pt resting comfortably and irritable when aroused  NVI  POD #3 s/p Right hip IM nail per Dr Tamera Punt  - up with PT/OT, encourage ambulation             -WBAT Minimize narcotics/pain meds to tylenol/tramadol, avoid oxy, pt very drowsy D/C to snf pending ABLA- expected PO, asymptomatic, stable Follow up w Dr. Tamera Punt in 2 weeks  VTE proph:asa, scds

## 2019-06-22 NOTE — Progress Notes (Signed)
Dr. Cyndia Skeeters made aware of BP 112/44 HR 70's.

## 2019-06-23 ENCOUNTER — Encounter (HOSPITAL_COMMUNITY): Payer: Self-pay | Admitting: Internal Medicine

## 2019-06-23 LAB — SARS CORONAVIRUS 2 (TAT 6-24 HRS): SARS Coronavirus 2: NEGATIVE

## 2019-06-23 LAB — RENAL FUNCTION PANEL
Albumin: 2.9 g/dL — ABNORMAL LOW (ref 3.5–5.0)
Anion gap: 9 (ref 5–15)
BUN: 73 mg/dL — ABNORMAL HIGH (ref 8–23)
CO2: 23 mmol/L (ref 22–32)
Calcium: 8.2 mg/dL — ABNORMAL LOW (ref 8.9–10.3)
Chloride: 106 mmol/L (ref 98–111)
Creatinine, Ser: 2.55 mg/dL — ABNORMAL HIGH (ref 0.44–1.00)
GFR calc Af Amer: 18 mL/min — ABNORMAL LOW (ref >60)
GFR calc non Af Amer: 16 mL/min — ABNORMAL LOW (ref >60)
Glucose, Bld: 115 mg/dL — ABNORMAL HIGH (ref 70–99)
Phosphorus: 4.2 mg/dL (ref 2.5–4.6)
Potassium: 4.4 mmol/L (ref 3.5–5.1)
Sodium: 138 mmol/L (ref 135–145)

## 2019-06-23 LAB — MAGNESIUM: Magnesium: 2.1 mg/dL (ref 1.7–2.4)

## 2019-06-23 MED ORDER — SODIUM CHLORIDE 0.9 % IV SOLN: INTRAVENOUS | Status: AC

## 2019-06-23 MED ORDER — BOOST PLUS PO LIQD
237.0000 mL | Freq: Two times a day (BID) | ORAL | Status: DC
  Administered 2019-06-24: 237 mL via ORAL
  Filled 2019-06-23 (×3): qty 237

## 2019-06-23 MED ORDER — ADULT MULTIVITAMIN W/MINERALS CH
1.0000 | ORAL_TABLET | Freq: Every day | ORAL | Status: DC
  Administered 2019-06-24: 1 via ORAL
  Filled 2019-06-23: qty 1

## 2019-06-23 NOTE — Progress Notes (Signed)
Initial Nutrition Assessment  DOCUMENTATION CODES:   Non-severe (moderate) malnutrition in context of chronic illness, Underweight  INTERVENTION:   No BM documented x 5 days    Boost Plus chocolate BID- Each supplement provides 360kcal and 14g protein.   MVI daily   NUTRITION DIAGNOSIS:   Moderate Malnutrition related to chronic illness(CHF, CKD) as evidenced by moderate fat depletion, severe muscle depletion.  GOAL:   Patient will meet greater than or equal to 90% of their needs  MONITOR:   PO intake, Supplement acceptance, Weight trends, Labs, I & O's, Skin  REASON FOR ASSESSMENT:   Consult Assessment of nutrition requirement/status  ASSESSMENT:   Patient with PMH significant for a.fib, CHF, PAD, CAD s/p CABG, s/p AVR, s/p AAA repair, s/p PPM, stage IV CKD, and recurrent falls. Presents this admission after mechanical fall resulting in R hip fracture.   5/6- s/p IM nailing R hip  Spoke with son at bedside. Reports pt's appetite was fair at home (typically not a "big eater"). Daily intake consisted of B-two slices of bacon, eggs, orange slices L- soup, sandwich D- meat with vegetable. She consumed Boost inconsistently. Appetite has declined this admission. Meal completions charted as 0-100% for her last eight meals (15% average). RD observed meal tray untouched at bedside. Discussed the importance of protein intake to promote post op healing. Okay to try Boost Plus this admission.   Son endorses pt's UBW stays around 85 lb. Records indicate pt weighed 90 lb on 1/25 and 85 kg this admission (5.6% wt loss in 5 months, insignificant for time frame).   Drips: NS @ 75 ml/hr  Medications: Vit D, colace, Vit b12 Labs: Cr 2.55- trending down   Diet Order:   Diet Order            Diet regular Room service appropriate? No; Fluid consistency: Thin  Diet effective now              EDUCATION NEEDS:   Not appropriate for education at this time  Skin:  Skin Assessment:  Skin Integrity Issues: Skin Integrity Issues:: Incisions Incisions: R hip  Last BM:  PTA  Height:   Ht Readings from Last 1 Encounters:  06/18/19 5\' 3"  (1.6 m)    Weight:   Wt Readings from Last 1 Encounters:  06/23/19 46.3 kg    BMI:  Body mass index is 18.08 kg/m.  Estimated Nutritional Needs:   Kcal:  1400-1600 kcal  Protein:  70-95 grams  Fluid:  >/= 1.4 L/day   Holly Bolton RD, LDN Clinical Nutrition Pager listed in [place]

## 2019-06-23 NOTE — Progress Notes (Signed)
PROGRESS NOTE    Holly Bolton  MOQ:[id number] DOB: 04-08-1922 DOA: 06/18/2019 PCP: Gayland Curry, DO   Brief Narrative:  86 year old with history of atrial flutter/fibrillation on amiodarone not on anticoagulation, chronic systolic CHF, CAD status post CABG, status post AVR, PAD, aortic aneurysm repair, pacemaker for sick sinus syndrome, second-degree AV block, hypothyroidism, CKD stage IV admitted after a fall and right hip pain.  Trauma work-up including CT head and CT cervical spine negative.  Showed right apical pneumothorax.  CT chest showed large left-sided and small right-sided pleural effusion, mild T3 compression fracture.  CT of the right hip showed right femur fracture.  Orthopedic consulted.  Underwent IM nailing of the right hip.  Seen by cardiology.  Previous echo showed moderate pulmonary hypertension, PA pressure 67.  Hospital course complicated by acute kidney injury requiring hydration.   Assessment & Plan:   Principal Problem:   Closed intertrochanteric fracture of right hip (HCC) Active Problems:   CAD s/p CABG (LIMA to LAD)   History of aortic root repairand bioprosthetic AVR   Hyperlipidemia   Pacemaker, dual chamber [employer] 2003/2010/2019   Essential hypertension   Persistent atrial fibrillation (HCC)   Chronic systolic CHF (congestive heart failure) (HCC)   Acute kidney injury superimposed on CKD ([place])   Adult failure to thrive   Closed fracture of right hip (HCC)   Preoperative clearance  Mechanical fall leading to closed right intertrochanteric fracture Status post right hip IM nailing.  Weightbearing as tolerated.  Pain control, bowel regimen.  Discharge to SNF, follow-up outpatient Dr. Tamera Punt in 2 weeks Aspirin and SCDs for DVT prophylaxis Pain medications upon discharge per orthopedic.  Acute kidney injury on CKD stage IIIb, slowly improving. Baseline creatinine 1.5.  Most likely prerenal, gentle hydration today.  Creatinine today 2.55.  Acute  hypoxic respiratory failure requiring 2-3 L nasal cannula Improved, combination of pleural effusion, atelectasis and some pneumothorax.  Supplemental oxygen.  Incentive spirometer.  Tiny right apical pneumothorax Resolved.  Conservative management.  Chronic bilateral left greater than right pleural effusion Mostly chronic, overall euvolemic.  Chronic congestive heart failure with reduced ejection fraction -Echo 4/16-EF 45-50%, grade 2 DD.  Mostly appears euvolemic.  Cardiology following.  CAD/PAD -Denies any chest pain at the moment.  Anemia of chronic disease -Monitor CBC  History of atrial fibrillation/persistent atrial fibrillation Sick sinus syndrome status post pacemaker in place. -Continue amiodarone.  Not on anticoagulation due to falls risk  Moderate to severe protein calorie malnutrition Adult failure to thrive -Encourage p.o. intake.  Seen by palliative care service.  Appreciate their input.   DVT prophylaxis: SCDs preoperatively Code Status: DNR per son at bedside.  Confirmed by palliative. Family Communication: Son at bedside  Status is: Inpatient  Remains inpatient appropriate because:Unsafe d/c plan.  Discharge to SNF once renal function stabilized.   Dispo: The patient is from: Home              Anticipated d/c is to: SNF              Anticipated d/c date is: 1-2 days              Patient currently is not medically stable to d/c.  Awaiting renal function to improve thereafter SNF.  Getting gentle hydration today.  Subjective: Resting well no complaints, I did not wake the patient up as she was awake most of the night.  No complaints.  Discussed with patient's son at bedside.  Review of Systems  Otherwise negative except as per HPI, including: Difficult to obtain full review of systems patient is sleeping but appears very comfortable.  Examination: Constitutional: Resting comfortably, arousable, elderly cachectic frail. Respiratory: Minimal bibasilar  crackles Cardiovascular: Normal sinus rhythm, no rubs Abdomen: Nontender nondistended good bowel sounds Musculoskeletal: No edema noted Skin: Surgical site noted without any evidence of infection or bleeding. Neurologic: Unable to assess Psychiatric: Unable to assess  Objective: Vitals:   06/22/19 1516 06/22/19 2047 06/23/19 0500 06/23/19 0529  BP: (!) 118/54 (!) 129/50  132/66  Pulse: 77 81  60  Resp:    18  Temp:  97.9 F (36.6 C)  98.3 F (36.8 C)  TempSrc:  Oral  Oral  SpO2: 93% 91%  100%  Weight:   46.3 kg   Height:        Intake/Output Summary (Last 24 hours) at 06/23/2019 0847 Last data filed at 06/23/2019 0552 Gross per 24 hour  Intake 1632.55 ml  Output 50 ml  Net 1582.55 ml   Filed Weights   06/20/19 0500 06/21/19 0417 06/23/19 0500  Weight: 40.5 kg 40.6 kg 46.3 kg     Data Reviewed:   CBC: Recent Labs  Lab 06/18/19 1048 06/19/19 0508 06/20/19 0442 06/21/19 0603 06/22/19 0502  WBC 7.4 10.4 14.1* 11.1* 9.4  NEUTROABS 5.4  --   --   --   --   HGB 10.4* 10.3* 9.0* 8.7* 8.7*  HCT 33.1* 32.9* 29.0* 27.0* 26.7*  MCV 105.1* 106.1* 108.2* 101.5* 101.5*  PLT 221 208 189 198 989   Basic Metabolic Panel: Recent Labs  Lab 06/19/19 0508 06/20/19 0442 06/21/19 0603 06/22/19 0502 06/23/19 0258  NA 143 137 134* 137 138  K 4.7 4.3 4.5 4.7 4.4  CL 103 101 98 103 106  CO2 28 26 25 24 23   GLUCOSE 119* 110* 121* 111* 115*  BUN 48* 58* 69* 72* 73*  CREATININE 2.01* 2.63* 3.04* 2.68* 2.55*  CALCIUM 8.5* 8.4* 8.4* 8.2* 8.2*  MG  --   --  2.1 2.3 2.1  PHOS  --   --  4.7* 4.6 4.2   GFR: Estimated Creatinine Clearance: 9.9 mL/min (A) (by C-G formula based on SCr of 2.55 mg/dL (H)). Liver Function Tests: Recent Labs  Lab 06/18/19 1048 06/19/19 0508 06/21/19 0603 06/22/19 0502 06/23/19 0258  AST 23 20  --   --   --   ALT 20 22  --   --   --   ALKPHOS 102 102  --   --   --   BILITOT 0.9 1.2  --   --   --   PROT 6.5 6.4*  --   --   --   ALBUMIN 3.3*  3.2* 3.1* 2.9* 2.9*   No results for input(s): LIPASE, AMYLASE in the last 168 hours. Recent Labs  Lab 06/22/19 1506  AMMONIA 18   Coagulation Profile: No results for input(s): INR, PROTIME in the last 168 hours. Cardiac Enzymes: No results for input(s): CKTOTAL, CKMB, CKMBINDEX, TROPONINI in the last 168 hours. BNP (last 3 results) No results for input(s): PROBNP in the last 8760 hours. HbA1C: No results for input(s): HGBA1C in the last 72 hours. CBG: No results for input(s): GLUCAP in the last 168 hours. Lipid Profile: No results for input(s): CHOL, HDL, LDLCALC, TRIG, CHOLHDL, LDLDIRECT in the last 72 hours. Thyroid Function Tests: Recent Labs    06/22/19 1506  TSH 0.544   Anemia Panel: Recent Labs    06/20/19  1310  VITAMINB12 1,217*  FOLATE 10.1  FERRITIN 535*  TIBC 209*  IRON 25*  RETICCTPCT 1.7   Sepsis Labs: No results for input(s): PROCALCITON, LATICACIDVEN in the last 168 hours.  Recent Results (from the past 240 hour(s))  Respiratory Panel by RT PCR (Flu A&B, Covid) - Nasopharyngeal Swab     Status: None   Collection Time: 06/18/19  3:21 PM   Specimen: Nasopharyngeal Swab  Result Value Ref Range Status   SARS Coronavirus 2 by RT PCR NEGATIVE NEGATIVE Final    Comment: (NOTE) SARS-CoV-2 target nucleic acids are NOT DETECTED. The SARS-CoV-2 RNA is generally detectable in upper respiratoy specimens during the acute phase of infection. The lowest concentration of SARS-CoV-2 viral copies this assay can detect is 131 copies/mL. A negative result does not preclude SARS-Cov-2 infection and should not be used as the sole basis for treatment or other patient management decisions. A negative result may occur with  improper specimen collection/handling, submission of specimen other than nasopharyngeal swab, presence of viral mutation(s) within the areas targeted by this assay, and inadequate number of viral copies (<131 copies/mL). A negative result must be  combined with clinical observations, patient history, and epidemiological information. The expected result is Negative. Fact Sheet for Patients:  PinkCheek.be Fact Sheet for Healthcare Providers:  GravelBags.it This test is not yet ap proved or cleared by the [place] FDA and  has been authorized for detection and/or diagnosis of SARS-CoV-2 by FDA under an Emergency Use Authorization (EUA). This EUA will remain  in effect (meaning this test can be used) for the duration of the COVID-19 declaration under Section 564(b)(1) of the Act, 21 U.S.C. section 360bbb-3(b)(1), unless the authorization is terminated or revoked sooner.    Influenza A by PCR NEGATIVE NEGATIVE Final   Influenza B by PCR NEGATIVE NEGATIVE Final    Comment: (NOTE) The Xpert Xpress SARS-CoV-2/FLU/RSV assay is intended as an aid in  the diagnosis of influenza from Nasopharyngeal swab specimens and  should not be used as a sole basis for treatment. Nasal washings and  aspirates are unacceptable for Xpert Xpress SARS-CoV-2/FLU/RSV  testing. Fact Sheet for Patients: PinkCheek.be Fact Sheet for Healthcare Providers: GravelBags.it This test is not yet approved or cleared by the [place] FDA and  has been authorized for detection and/or diagnosis of SARS-CoV-2 by  FDA under an Emergency Use Authorization (EUA). This EUA will remain  in effect (meaning this test can be used) for the duration of the  Covid-19 declaration under Section 564(b)(1) of the Act, 21  U.S.C. section 360bbb-3(b)(1), unless the authorization is  terminated or revoked. Performed at [facility], [place] [address] [place], [place] [address]          Radiology Studies: US RENAL  Result Date: 06/21/2019 CLINICAL DATA:  Acute kidney injury EXAM: RENAL / URINARY TRACT ULTRASOUND COMPLETE COMPARISON:  None.  FINDINGS: Right Kidney: Surgically absent.  No lesions seen in right pararenal fossa region. Left Kidney: Renal measurements: 9.4 x 3.7 x 4.5 cm = volume: 83.0 mL. Echogenicity and renal cortical thickness are within normal limits. No mass, perinephric fluid, or hydronephrosis visualized. No sonographically demonstrable calculus or ureterectasis. Bladder: Appears normal for degree of bladder distention. Other: None. IMPRESSION: Normal appearing left kidney.  Right kidney absent. Electronically Signed   By: Lowella Grip III M.D.   On: 06/21/2019 10:38   DG Chest Port 1 View  Result Date: 06/22/2019 CLINICAL DATA:  Hypoxia. Postop from repair of right  hip fracture. EXAM: PORTABLE CHEST 1 VIEW COMPARISON:  06/19/2019 FINDINGS: Patient is rotated to the left. Cardiomegaly remains stable. Aortic valve replacement noted. Dual lead transvenous pacemaker remains in appropriate position Diffuse interstitial infiltrates with central perihilar predominance show no significant, suspicious for pulmonary edema. Left lower lobe atelectasis or consolidation remains stable. IMPRESSION: 1. Stable cardiomegaly and diffuse interstitial infiltrates/edema. 2. Stable left lower lobe atelectasis or consolidation. Electronically Signed   By: Marlaine Hind M.D.   On: 06/22/2019 13:04        Scheduled Meds: . acetaminophen  1,000 mg Oral Q8H  . amiodarone  200 mg Oral Daily  . aspirin  81 mg Oral Daily  . brimonidine  1 drop Both Eyes BID  . cholecalciferol  2,000 Units Oral QHS  . docusate sodium  100 mg Oral BID  . heparin injection (subcutaneous)  5,000 Units Subcutaneous Q8H  . sodium chloride flush  3 mL Intravenous Q12H  . sodium chloride flush  3 mL Intravenous Q12H  . vitamin B-12  500 mcg Oral Daily   Continuous Infusions: . sodium chloride    . sodium chloride    . sodium chloride       LOS: 5 days   Time spent= 35 mins    Katina Remick Arsenio Loader, MD [facility]  If 7PM-7AM, please contact  night-coverage  06/23/2019, 8:47 AM

## 2019-06-23 NOTE — Care Management Important Message (Signed)
Important Message  Patient Details IM Letter given to Dessa Phi RN Case Manager to present to the Patient Name: Holly Bolton MRN: [id number] Date of Birth: 08/20/1922   Medicare Important Message Given:  Yes     Kerin Salen 06/23/2019, 12:46 PM

## 2019-06-23 NOTE — Progress Notes (Addendum)
Patient having periods of urinary retention. Bladder scanned twice with high volumes and In & Out cathed once with 350 ml returned and pt incontinent large amount on pad. Continue to monier. She has become more alert as shift has gone on. Eulas Post, RN

## 2019-06-23 NOTE — TOC Progression Note (Signed)
Transition of Care [facility]) - Progression Note    Patient Details  Name: Holly Bolton MRN: [id number] Date of Birth: 1922-07-26  Transition of Care [facility]) CM/SW Contact  Blayn Whetsell, Juliann Pulse, RN Phone Number: 06/23/2019, 10:32 AM  Clinical Narrative:   1. 1.5 mi [place] and [facility] [address] [place], [place] [address] [phone] Overall rating Much above average 2. 1.5 mi [place] at [place], [place] [address] [phone] Overall rating Below average 3. 2.2 mi [place], [place] [address] [phone] Overall rating Average 4. 2.4 mi [facility] [place], [place] [address] [phone] Overall rating Below average 5. 2.7 mi [facility] at the [place], [address] [address] [phone] Overall rating Below average 6. 2.9 mi [facility] [place] and [facility] [address] [place], [place] [address] [phone] Overall rating Much below average 7. 3.1 mi [facility] [place], [place] [address] [phone] Overall rating Above average 8. 3.6 [place], [place] [address] [phone] Overall rating Below average 9. 3.7 mi [facility] [place] [address] [place], [place] [address] [phone] Overall rating Below average 10. 3.8 mi [facility] [place], [place] [address] [phone] Overall rating Average 11. 4.2 mi [facility] at [place], [place] [address] [phone] Overall rating Much above average 12. 4.7 mi [facility] [address] [place], [place] [address] [phone] Overall rating Much above  average 13. 5.5 mi [facility] [address] [place], [place] [address] [phone] Overall rating Average 14. [place], [place] [address] [phone] Overall rating Above average 15. 8.8 mi [facility] [place], [place] [address] [phone] Overall rating Much below average <Previous 1 2 3  16. 9 mi [facility] [address], [place] [address] [phone] Overall rating Below average 17. 9.3 mi [facility] [address] [place], [place] [address] [phone] Overall rating Much above average 18. 10.8 mi [place] at [facility] [address] [place], [place] [address] [phone] Overall rating Much above average 19. 12.8 [facility] [address] [place], [place] [address] [phone] Overall rating Much below average 20. 12.8 mi [facility] [place], [place] [address] [phone] Overall rating Much below average 21. 13.1 mi [address] [place], [place] [address] [phone] Overall rating Much below average 22. 14.2 mi [place] [address] [place], [place] [address] [phone] Overall rating Much below average 23. 14.3 mi The [place] [address] [place], [place] [address] [phone] Overall rating Much below average 24. 14.4 mi [facility] [place], [place] [address] [phone] Overall rating Below average 25. 14.8 mi [place], [place] [address] [phone] Overall rating Above average 26. 16.5 mi [place] [address] [place], [place] [address] [phone] Overall  rating Below average 27. 16.7 mi [facility] [place],  [place] [address] [phone] Overall rating Much above average 28. 16.9 mi [facility] [place], [place] [address] [phone] Overall rating Much above average 29. 17.8 mi [facility] [place], [place] [address] [phone] Overall rating Below average 30. 22.0 [facility] [address] [place], [place] [address] [phone] Overall rating Much below average <Previous 1 2 3  16. 9 mi [facility] [address] [place], [place] [address] [phone] Overall rating Below average 17. 9.3 mi [facility] [address] [place], [place] [address] [phone] Overall rating Much above average 18. 10.8 mi [place] at [facility] [address] [place], [place] [address] [phone] Overall rating Much above average 19. 12.8 [facility] [address] [place], [place] [address] [phone] Overall rating Much below average 20. 12.8 mi [facility] [address] [place], [place] [address] [phone] Overall rating Much below average 21. 13.1 [address] [place], [place] [address] [phone] Overall rating Much below average 22. 14.2 mi [place] [address] [place], [place] [address] [phone] Overall rating Much below average 23. 14.3 mi [address] [place], [place] [address] [phone] Overall rating Much below average 24. 14.4 mi [facility] at [place], [place] [address] [phone] Overall rating Below average 25. 14.8 mi [place], [place] [address] [phone] Overall rating Above average 26. 16.5 mi [place] [address] [place], [place] [address] [phone] Overall rating Below average 27. 16.7 [place], [place] [address] [phone] Overall rating Much above average 28. 16.9 mi [facility] [place], [place] [address] [phone] Overall rating Much above average 29. 17.8 mi [facility] [place], [place] [address] [phone] Overall rating Below average 30. 94.7 [facility] [address] [place], [place] [address] [phone] Overall rating Much below average <Previous 1 2 3   31. 19.8 mi [place] [address] [place], [place] [address] [phone] Overall rating Below average 32. 20.1 mi [address] at [employer] at [facility], [place] [address] [phone] Overall rating Much above average 33. 20.9 [place] and [facility] [place], [place] [address] [phone] Overall rating Average 34. 00.7 [facility] [address] [place], [place] [address] [phone] Overall rating Below average 35. 21.7 [place], [place] [address] [phone] Overall rating Much above average 36. 21.9 mi [address] [place], [place] [address] [phone] Overall rating Below average 37. 22.1 mi [employer] [address] [place], [place] [address] [phone] Overall rating Much above average 38. 22.7 mi [facility] [address] [place], [place] [address] [phone] Overall rating Below average 39. 22.7 mi [facility] [place], [place] [address] [phone] Overall  rating Much below average 40. 23.4 mi [facility] [place] [address] [place], [place] [address] [phone] Overall rating Above average 41. 23.6 [place], [place] [address] [phone]  Overall rating Average 42. 24.2 mi [place] [address] [place], [place] [address] [phone] Overall rating Much below average 43. 24.2 [facility] [place], [place] [address] [phone] Overall rating Much below average 44. 24.5 [facility] [address] [place], [place] [address] [phone] Overall rating Much below average 45. 24.6 mi [facility] [place], [place] [address] [phone] Overall rating Much below average <Previous 1 2 3     Expected Discharge Plan: [place] Barriers to Discharge: Continued Medical Work up  Expected Discharge Plan and Services Expected Discharge Plan: [place]   Discharge Planning Services: CM Consult   Living arrangements for the past 2 months: Single Family Home                                       Social Determinants of Health (SDOH) Interventions    Readmission Risk Interventions Readmission Risk Prevention Plan 06/20/2019  Transportation Screening Complete  PCP or Specialist Appt within 3-5 Days Complete  HRI or [place] Complete  Social Work Consult for [place] Planning/Counseling Complete  Palliative Care Screening Complete  Medication Review Press photographer) Complete  Some recent data might be hidden

## 2019-06-23 NOTE — TOC Progression Note (Signed)
Transition of Care [facility]) - Progression Note    Patient Details  Name: Holly Bolton MRN: [id number] Date of Birth: 11/01/1922  Transition [facility]) CM/SW Contact  Fields Oros, Juliann Pulse, RN Phone Number: 06/23/2019, 11:10 AM  Clinical Narrative:  Bed offers givent o Son [name]-await choice.     Expected Discharge Plan: Skilled Nursing Facility Barriers to Discharge: Continued Medical Work up  Expected Discharge Plan and Services Expected Discharge Plan: [place]   Discharge Planning Services: CM Consult   Living arrangements for the past 2 months: Single Family Home                                       Social Determinants of Health (SDOH) Interventions    Readmission Risk Interventions Readmission Risk Prevention Plan 06/20/2019  Transportation Screening Complete  PCP or Specialist Appt within 3-5 Days Complete  HRI or [place] Complete  Social Work Consult for [place] Planning/Counseling Complete  Palliative Care Screening Complete  Medication Review Press photographer) Complete  Some recent data might be hidden

## 2019-06-23 NOTE — Progress Notes (Signed)
PT Cancellation Note  Patient Details Name: MAITE BURLISON MRN: [id number] DOB: 1922-05-04   Cancelled Treatment:     NT reported pt was + 2 Total Assist (pt 0%) just to reposition and perform hygiene this morning.  Unable to respond enough to participate in Physical Therapy.  Pt has been evaluated with rec for SNF.    Rica Koyanagi  PTA Acute  Rehabilitation Services Pager      [phone] Office      [phone]

## 2019-06-23 NOTE — Progress Notes (Signed)
   06/23/19 1306  Assess: MEWS Score  Temp (!) 97.3 F (36.3 C)  BP (!) 143/43  Pulse Rate 61  Resp (!) 28  SpO2 91 %  Assess: MEWS Score  MEWS Temp 0  MEWS Systolic 0  MEWS Pulse 0  MEWS RR 2  MEWS LOC 0  MEWS Score 2  MEWS Score Color Yellow  Assess: if the MEWS score is Yellow or Red  Were vital signs taken at a resting state? Yes  Focused Assessment Documented focused assessment  Early Detection of Sepsis Score *See Row Information* Low  MEWS guidelines implemented *See Row Information* Yes  Treat  MEWS Interventions Other (Comment) (reassessed pt)  Take Vital Signs  Increase Vital Sign Frequency  Yellow: Q 2hr X 2 then Q 4hr X 2, if remains yellow, continue Q 4hrs  Escalate  MEWS: Escalate Yellow: discuss with charge nurse/RN and consider discussing with provider and RRT  Notify: Charge Nurse/RN  Name of Charge Nurse/RN Notified Sirena Riddle, RN  Date Charge Nurse/RN Notified 06/23/19  Time Charge Nurse/RN Notified 1355  Notify: Provider  Provider Name/Title A. AMmin  Date Provider Notified 06/23/19  Time Provider Notified 1355  Notification Type Page    Pt mews turned yellow. RR elevated and BP diastolic low. Yellow MEWS initiated. Will continue to closely monitor pt.

## 2019-06-24 DIAGNOSIS — Z950 Presence of cardiac pacemaker: Secondary | ICD-10-CM | POA: Diagnosis not present

## 2019-06-24 DIAGNOSIS — M2550 Pain in unspecified joint: Secondary | ICD-10-CM | POA: Diagnosis not present

## 2019-06-24 DIAGNOSIS — I2510 Atherosclerotic heart disease of native coronary artery without angina pectoris: Secondary | ICD-10-CM | POA: Diagnosis not present

## 2019-06-24 DIAGNOSIS — S72141D Displaced intertrochanteric fracture of right femur, subsequent encounter for closed fracture with routine healing: Secondary | ICD-10-CM | POA: Diagnosis not present

## 2019-06-24 DIAGNOSIS — I25810 Atherosclerosis of coronary artery bypass graft(s) without angina pectoris: Secondary | ICD-10-CM | POA: Diagnosis not present

## 2019-06-24 DIAGNOSIS — S72001A Fracture of unspecified part of neck of right femur, initial encounter for closed fracture: Secondary | ICD-10-CM | POA: Diagnosis not present

## 2019-06-24 DIAGNOSIS — T148XXA Other injury of unspecified body region, initial encounter: Secondary | ICD-10-CM | POA: Diagnosis not present

## 2019-06-24 DIAGNOSIS — Z9889 Other specified postprocedural states: Secondary | ICD-10-CM | POA: Diagnosis not present

## 2019-06-24 DIAGNOSIS — I4891 Unspecified atrial fibrillation: Secondary | ICD-10-CM | POA: Diagnosis not present

## 2019-06-24 DIAGNOSIS — R410 Disorientation, unspecified: Secondary | ICD-10-CM | POA: Diagnosis not present

## 2019-06-24 DIAGNOSIS — S80812D Abrasion, left lower leg, subsequent encounter: Secondary | ICD-10-CM | POA: Diagnosis not present

## 2019-06-24 DIAGNOSIS — Z7401 Bed confinement status: Secondary | ICD-10-CM | POA: Diagnosis not present

## 2019-06-24 DIAGNOSIS — R52 Pain, unspecified: Secondary | ICD-10-CM | POA: Diagnosis not present

## 2019-06-24 DIAGNOSIS — R1312 Dysphagia, oropharyngeal phase: Secondary | ICD-10-CM | POA: Diagnosis not present

## 2019-06-24 DIAGNOSIS — N189 Chronic kidney disease, unspecified: Secondary | ICD-10-CM | POA: Diagnosis not present

## 2019-06-24 DIAGNOSIS — E039 Hypothyroidism, unspecified: Secondary | ICD-10-CM | POA: Diagnosis not present

## 2019-06-24 DIAGNOSIS — S72144D Nondisplaced intertrochanteric fracture of right femur, subsequent encounter for closed fracture with routine healing: Secondary | ICD-10-CM | POA: Diagnosis not present

## 2019-06-24 DIAGNOSIS — S72144G Nondisplaced intertrochanteric fracture of right femur, subsequent encounter for closed fracture with delayed healing: Secondary | ICD-10-CM | POA: Diagnosis not present

## 2019-06-24 DIAGNOSIS — R278 Other lack of coordination: Secondary | ICD-10-CM | POA: Diagnosis not present

## 2019-06-24 DIAGNOSIS — S72091D Other fracture of head and neck of right femur, subsequent encounter for closed fracture with routine healing: Secondary | ICD-10-CM | POA: Diagnosis not present

## 2019-06-24 DIAGNOSIS — I509 Heart failure, unspecified: Secondary | ICD-10-CM | POA: Diagnosis not present

## 2019-06-24 DIAGNOSIS — N179 Acute kidney failure, unspecified: Secondary | ICD-10-CM | POA: Diagnosis not present

## 2019-06-24 DIAGNOSIS — E440 Moderate protein-calorie malnutrition: Secondary | ICD-10-CM | POA: Diagnosis not present

## 2019-06-24 DIAGNOSIS — R5381 Other malaise: Secondary | ICD-10-CM | POA: Diagnosis not present

## 2019-06-24 DIAGNOSIS — I10 Essential (primary) hypertension: Secondary | ICD-10-CM | POA: Diagnosis not present

## 2019-06-24 DIAGNOSIS — D539 Nutritional anemia, unspecified: Secondary | ICD-10-CM | POA: Diagnosis not present

## 2019-06-24 DIAGNOSIS — R2681 Unsteadiness on feet: Secondary | ICD-10-CM | POA: Diagnosis not present

## 2019-06-24 DIAGNOSIS — J9601 Acute respiratory failure with hypoxia: Secondary | ICD-10-CM | POA: Diagnosis not present

## 2019-06-24 DIAGNOSIS — I739 Peripheral vascular disease, unspecified: Secondary | ICD-10-CM | POA: Diagnosis not present

## 2019-06-24 DIAGNOSIS — J45909 Unspecified asthma, uncomplicated: Secondary | ICD-10-CM | POA: Diagnosis not present

## 2019-06-24 DIAGNOSIS — M6281 Muscle weakness (generalized): Secondary | ICD-10-CM | POA: Diagnosis not present

## 2019-06-24 DIAGNOSIS — R627 Adult failure to thrive: Secondary | ICD-10-CM | POA: Diagnosis not present

## 2019-06-24 DIAGNOSIS — L89152 Pressure ulcer of sacral region, stage 2: Secondary | ICD-10-CM | POA: Diagnosis not present

## 2019-06-24 DIAGNOSIS — R41841 Cognitive communication deficit: Secondary | ICD-10-CM | POA: Diagnosis not present

## 2019-06-24 DIAGNOSIS — N184 Chronic kidney disease, stage 4 (severe): Secondary | ICD-10-CM | POA: Diagnosis not present

## 2019-06-24 DIAGNOSIS — M810 Age-related osteoporosis without current pathological fracture: Secondary | ICD-10-CM | POA: Diagnosis not present

## 2019-06-24 DIAGNOSIS — I5022 Chronic systolic (congestive) heart failure: Secondary | ICD-10-CM | POA: Diagnosis not present

## 2019-06-24 DIAGNOSIS — R2689 Other abnormalities of gait and mobility: Secondary | ICD-10-CM | POA: Diagnosis not present

## 2019-06-24 DIAGNOSIS — N183 Chronic kidney disease, stage 3 unspecified: Secondary | ICD-10-CM | POA: Diagnosis not present

## 2019-06-24 LAB — BASIC METABOLIC PANEL
Anion gap: 7 (ref 5–15)
BUN: 65 mg/dL — ABNORMAL HIGH (ref 8–23)
CO2: 23 mmol/L (ref 22–32)
Calcium: 8.4 mg/dL — ABNORMAL LOW (ref 8.9–10.3)
Chloride: 110 mmol/L (ref 98–111)
Creatinine, Ser: 2.07 mg/dL — ABNORMAL HIGH (ref 0.44–1.00)
GFR calc Af Amer: 23 mL/min — ABNORMAL LOW (ref >60)
GFR calc non Af Amer: 20 mL/min — ABNORMAL LOW (ref >60)
Glucose, Bld: 111 mg/dL — ABNORMAL HIGH (ref 70–99)
Potassium: 4.8 mmol/L (ref 3.5–5.1)
Sodium: 140 mmol/L (ref 135–145)

## 2019-06-24 LAB — RENAL FUNCTION PANEL
Albumin: 2.8 g/dL — ABNORMAL LOW (ref 3.5–5.0)
Anion gap: 9 (ref 5–15)
BUN: 64 mg/dL — ABNORMAL HIGH (ref 8–23)
CO2: 22 mmol/L (ref 22–32)
Calcium: 8.4 mg/dL — ABNORMAL LOW (ref 8.9–10.3)
Chloride: 109 mmol/L (ref 98–111)
Creatinine, Ser: 1.98 mg/dL — ABNORMAL HIGH (ref 0.44–1.00)
GFR calc Af Amer: 24 mL/min — ABNORMAL LOW (ref >60)
GFR calc non Af Amer: 21 mL/min — ABNORMAL LOW (ref >60)
Glucose, Bld: 111 mg/dL — ABNORMAL HIGH (ref 70–99)
Phosphorus: 3.7 mg/dL (ref 2.5–4.6)
Potassium: 4.8 mmol/L (ref 3.5–5.1)
Sodium: 140 mmol/L (ref 135–145)

## 2019-06-24 LAB — MAGNESIUM: Magnesium: 2.2 mg/dL (ref 1.7–2.4)

## 2019-06-24 MED ORDER — POLYETHYLENE GLYCOL 3350 17 G PO PACK: 17.0000 g | PACK | Freq: Every day | ORAL | 0 refills | Status: AC | PRN

## 2019-06-24 MED ORDER — DOCUSATE SODIUM 100 MG PO CAPS: 100.0000 mg | ORAL_CAPSULE | Freq: Two times a day (BID) | ORAL | 0 refills | Status: AC

## 2019-06-24 MED ORDER — ASPIRIN 325 MG PO TABS: 325.0000 mg | ORAL_TABLET | Freq: Every day | ORAL | Status: AC

## 2019-06-24 MED ORDER — OXYCODONE HCL 5 MG PO TABS: 2.5000 mg | ORAL_TABLET | Freq: Three times a day (TID) | ORAL | 0 refills | Status: DC | PRN

## 2019-06-24 MED ORDER — ACETAMINOPHEN 500 MG PO TABS: 1000.0000 mg | ORAL_TABLET | Freq: Three times a day (TID) | ORAL | 0 refills | Status: AC

## 2019-06-24 MED ORDER — ACETAMINOPHEN 500 MG PO TABS: 1000.0000 mg | ORAL_TABLET | Freq: Three times a day (TID) | ORAL | 0 refills | Status: DC

## 2019-06-24 MED ORDER — TRAMADOL HCL 50 MG PO TABS: 50.0000 mg | ORAL_TABLET | Freq: Four times a day (QID) | ORAL | 0 refills | Status: DC | PRN

## 2019-06-24 NOTE — Consult Note (Signed)
   [facility] CM Inpatient Consult   06/24/2019  GRESIA ISIDORO Apr 17, 1922 [id number]   Patient chart reviewed for [place] Management services due to high unplanned readmission risk score on this admission. Per review, disposition is for skilled nursing facility. No THN CM needs.  Netta Cedars, MSN, [facility] Liaison Nurse Mobile Phone [phone]  Toll free office [phone]

## 2019-06-24 NOTE — Progress Notes (Signed)
Patient's   last bladder scanned @ 0550.Bladder Scan yielded 270ml. The patient did have an incontinence episode earlier thonight.

## 2019-06-24 NOTE — Progress Notes (Signed)
   PATIENT ID: Holly Bolton   5 Days Post-Op Procedure(s) (LRB): INTRAMEDULLARY (IM) NAIL INTERTROCHANTRIC (Right)  Subjective: Doing well, appropriate responsive and denies pain at rest.   Objective:  Vitals:   06/23/19 2200 06/24/19 0521  BP:  (!) 152/67  Pulse:  89  Resp:  19  Temp:  98 F (36.7 C)  SpO2: 96% 100%     R hip dressings changed today Incisions benign  Wiggles toes, distally NVI No calf tenderness/swelling  Labs:  Recent Labs    06/22/19 0502  HGB 8.7*   Recent Labs    06/22/19 0502  WBC 9.4  RBC 2.63*  HCT 26.7*  PLT 223   Recent Labs    06/23/19 0258 06/24/19 0531  NA 138 140  140  K 4.4 4.8  4.8  CL 106 109  110  CO2 23 22  23   BUN 73* 64*  65*  CREATININE 2.55* 1.98*  2.07*  GLUCOSE 115* 111*  111*  CALCIUM 8.2* 8.4*  8.4*    Assessment and Plan: 5 days s/p R IM nail WBAT D/c to SNF today, prescribed tramadol for pain control, asa 325 daily for DVT prophylaxis Follow up with Dr. Tamera Punt in 2 weeks

## 2019-06-24 NOTE — Progress Notes (Signed)
Report given to RN at [facility]. Awaiting [facility] for transport. Will continue to monitor.

## 2019-06-24 NOTE — Discharge Summary (Signed)
Physician Discharge Summary  NESTA SCATURRO HOZ:[id number] DOB: 06/09/1922 DOA: 06/18/2019  PCP: Gayland Curry, DO  Admit date: 06/18/2019 Discharge date: 06/24/2019  Admitted From: Home Home Disposition: SNF  Recommendations for Outpatient Follow-up:  1. Follow up with PCP in 1-2 weeks 2. Please obtain BMP/CBC in one week your next doctors visit.  3. 2 L nasal cannula, wean as appropriate 4. Aspirin 325 mg daily for DVT prophylaxis until follow-up with orthopedic in 2 weeks thereafter can transition to 81 mg. 5. Follow-up with outpatient orthopedic, Dr. Tamera Punt in 2 weeks   Discharge Condition: Stable CODE STATUS: DNR Diet recommendation: Cardiac  Brief/Interim Summary: 86 year old with history of atrial flutter/fibrillation on amiodarone not on anticoagulation, chronic systolic CHF, CAD status post CABG, status post AVR, PAD, aortic aneurysm repair, pacemaker for sick sinus syndrome, second-degree AV block, hypothyroidism, CKD stage IV admitted after a fall and right hip pain.  Trauma work-up including CT head and CT cervical spine negative.  Showed right apical pneumothorax.  CT chest showed large left-sided and small right-sided pleural effusion, mild T3 compression fracture.  CT of the right hip showed right femur fracture.  Orthopedic consulted.  Underwent IM nailing of the right hip.  Seen by cardiology.  Previous echo showed moderate pulmonary hypertension, PA pressure 67.  Hospital course complicated by acute kidney injury requiring hydration.  This improved with hydration, will repeat lab work in 1 week. Stable for discharge today.   Assessment & Plan:    Mechanical fall leading to closed right intertrochanteric fracture Status post right hip IM nailing.  Weightbearing as tolerated.  Pain control, bowel regimen.  Discharge to SNF, follow-up outpatient Dr. Tamera Punt in 2 weeks Aspirin 1025 mg for 2 weeks for DVT prophylaxis.  Once followed up with outpatient orthopedic  transition to 81 mg daily Pain medications upon discharge per orthopedic.  Acute kidney injury on CKD stage IIIb, slowly improving. Baseline creatinine 1.5.  Most likely prerenal, gentle hydration today.  Creatinine today 1.98.  Repeat lab work in 1 week.  Acute hypoxic respiratory failure requiring 2-3 L nasal cannula 2 L nasal cannula-suspect from small pneumothorax and bilateral effusion complicated by some atelectasis.  Effusion should improve with mobilization.  Tiny right apical pneumothorax Resolved.  Conservative management.  Chronic bilateral left greater than right pleural effusion Mostly chronic, overall euvolemic.  Chronic congestive heart failure with reduced ejection fraction -Echo 4/16-EF 45-50%, grade 2 DD.  Mostly appears euvolemic.  Cardiology following.  CAD/PAD -Denies any chest pain at the moment.  Anemia of chronic disease -Monitor CBC  History of atrial fibrillation/persistent atrial fibrillation Sick sinus syndrome status post pacemaker in place. -Continue amiodarone.  Not on anticoagulation due to falls risk  Moderate to severe protein calorie malnutrition Adult failure to thrive -Encourage p.o. intake.  Seen by palliative care service.  Appreciate their input.    Discharge Diagnoses:  Principal Problem:   Closed intertrochanteric fracture of right hip ([place]) Active Problems:   CAD s/p CABG (LIMA to LAD)   History of aortic root repairand bioprosthetic AVR   Hyperlipidemia   Pacemaker, dual chamber [employer] 2003/2010/2019   Essential hypertension   Persistent atrial fibrillation (H[facility])   Chronic systolic CHF (congestive heart failure) (HCC)   Acute kidney injury superimposed on CKD ([place])   Adult failure to thrive   Closed fracture of right hip (HCC)   Preoperative clearance   Malnutrition of moderate degree    Consultations:  Orthopedic  Subjective: No complaints this morning.  Eating breakfast with help of nurse  tech.  Discharge Exam: Vitals:   06/23/19 2200 06/24/19 0521  BP:  (!) 152/67  Pulse:  89  Resp:  19  Temp:  98 F (36.7 C)  SpO2: 96% 100%   Vitals:   06/23/19 1632 06/23/19 2100 06/23/19 2200 06/24/19 0521  BP:  136/71  (!) 152/67  Pulse: 62 (!) 59  89  Resp:  20  19  Temp:  97.8 F (36.6 C)  98 F (36.7 C)  TempSrc:  Oral  Oral  SpO2: 95% 96% 96% 100%  Weight:    45.6 kg  Height:        General: Pt is alert, awake, not in acute distress, elderly frail Cardiovascular: RRR, S1/S2 +, no rubs, no gallops Respiratory: CTA bilaterally, no wheezing, no rhonchi Abdominal: Soft, NT, ND, bowel sounds + Extremities: no edema, no cyanosis Hip dressing noted without any evidence of bleeding, on the right  Discharge Instructions   Allergies as of 06/24/2019      Reactions   Accupril [quinapril Hcl] Other (See Comments)   Unknown reaction    Amlodipine Swelling   Benadryl [diphenhydramine Hcl] Other (See Comments)   Unknown reaction    Biaxin [clarithromycin] Other (See Comments)   Unknown reaction    Ciprofloxacin Nausea And Vomiting   Codeine Other (See Comments)   Unknown reaction    Diovan [valsartan] Other (See Comments)   Unknown reaction    Medrol [methylprednisolone] Other (See Comments)   Unknown   Morphine And Related Other (See Comments)   Unknown reaction    Neurontin [gabapentin] Other (See Comments)   Unknown reaction    Penicillins    Unknown reaction  Did it involve swelling of the face/tongue/throat, SOB, or low BP? Unknown Did it involve sudden or severe rash/hives, skin peeling, or any reaction on the inside of your mouth or nose? Unknown Did you need to seek medical attention at a hospital or doctor's office? Unknown When did it last happen?unknown If all above answers are "NO", may proceed with cephalosporin use.      Medication List    TAKE these medications   acetaminophen 500 MG tablet Commonly known as: TYLENOL Take 2 tablets  (1,000 mg total) by mouth every 8 (eight) hours.   amiodarone 200 MG tablet Commonly known as: PACERONE TAKE ONE TABLET BY MOUTH TWICE A DAY FOR 1 WEEK THEN REDUCE TO 1 TABLET DAILY THEREAFTER What changed:   how much to take  how to take this  when to take this   aspirin 81 MG chewable tablet Chew 81 mg by mouth daily. What changed: Another medication with the same name was changed. Make sure you understand how and when to take each.   aspirin 325 MG tablet Take 1 tablet (325 mg total) by mouth daily. What changed:   when to take this  reasons to take this   brimonidine 0.2 % ophthalmic solution Commonly known as: ALPHAGAN Place 1 drop into both eyes 2 (two) times daily.   carvedilol 3.125 MG tablet Commonly known as: COREG TAKE ONE TABLET BY MOUTH TWICE A DAY WITH FOOD What changed: when to take this   CoQ10 200 MG Caps Take 200 mg by mouth daily.   diphenhydramine-acetaminophen 25-500 MG Tabs tablet Commonly known as: TYLENOL PM Take 0.5 tablets by mouth at bedtime as needed (sleep).   docusate sodium 100 MG capsule Commonly known as: COLACE Take 1 capsule (100 mg total) by mouth  2 (two) times daily.   furosemide 20 MG tablet Commonly known as: LASIX Take 1 tablet (20 mg total) by mouth daily.   lidocaine 4 % cream Commonly known as: LMX Apply 1 application topically daily as needed (pain).   lidocaine 5 % Commonly known as: LIDODERM Place 1 patch onto the skin daily as needed (pain). Remove & Discard patch within 12 hours or as directed by MD   polyethylene glycol 17 g packet Commonly known as: MIRALAX / GLYCOLAX Take 17 g by mouth daily as needed for mild constipation.   traMADol 50 MG tablet Commonly known as: Ultram Take 1 tablet (50 mg total) by mouth every 6 (six) hours as needed.   vitamin B-12 500 MCG tablet Commonly known as: CYANOCOBALAMIN Take 500 mcg by mouth daily.   Vitamin D 50 MCG (2000 UT) Caps Take 2,000 Units by mouth at  bedtime.       Contact information for follow-up providers    Tania Ade, MD. Schedule an appointment as soon as possible for a visit in 2 weeks.   Specialty: Orthopedic Surgery Contact information: [place] [address] [phone]        Gayland Curry, DO. Schedule an appointment as soon as possible for a visit in 1 week(s).   Specialty: Geriatric Medicine Contact information: [place]. [place] [address] [phone]        Sanda Klein, MD .   Specialty: Cardiology Contact information: [address] [place] [address] [phone]            Contact information for after-discharge care    Destination    [facility] .   Service: Skilled Nursing Contact information: [place] [phone]                 Allergies  Allergen Reactions  . Accupril [Quinapril Hcl] Other (See Comments)    Unknown reaction   . Amlodipine Swelling  . Benadryl [Diphenhydramine Hcl] Other (See Comments)    Unknown reaction   . Biaxin [Clarithromycin] Other (See Comments)    Unknown reaction   . Ciprofloxacin Nausea And Vomiting  . Codeine Other (See Comments)    Unknown reaction   . Diovan [Valsartan] Other (See Comments)    Unknown reaction   . Medrol [Methylprednisolone] Other (See Comments)    Unknown  . Morphine And Related Other (See Comments)    Unknown reaction   . Neurontin [Gabapentin] Other (See Comments)    Unknown reaction   . Penicillins     Unknown reaction  Did it involve swelling of the face/tongue/throat, SOB, or low BP? Unknown Did it involve sudden or severe rash/hives, skin peeling, or any reaction on the inside of your mouth or nose? Unknown Did you need to seek medical attention at a hospital or doctor's office? Unknown When did it last happen?unknown If all above answers are "NO", may proceed  with cephalosporin use.      You were cared for by a hospitalist during your hospital stay. If you have any questions about your discharge medications or the care you received while you were in the hospital after you are discharged, you can call the unit and asked to speak with the hospitalist on call if the hospitalist that took care of you is not available. Once you are discharged, your primary care physician will handle any further medical issues. Please note that no  refills for any discharge medications will be authorized once you are discharged, as it is imperative that you return to your primary care physician (or establish a relationship with a primary care physician if you do not have one) for your aftercare needs so that they can reassess your need for medications and monitor your lab values.   Procedures/Studies: DG Chest 2 View  Result Date: 06/11/2019 CLINICAL DATA:  Pleural effusion. Shortness of breath and congestion 3 days. EXAM: CHEST - 2 VIEW COMPARISON:  01/22/2019 FINDINGS: Sternotomy wires and dual lead right pacemaker unchanged. Lungs are adequately inflated and demonstrate stable moderate size left pleural effusion likely with associated basilar atelectasis. Cardiomediastinal silhouette and remainder of the exam is unchanged. IMPRESSION: Stable moderate size left pleural effusion likely with associated basilar atelectasis. Electronically Signed   By: Marin Olp M.D.   On: 06/11/2019 09:00   CT Head Wo Contrast  Result Date: 06/18/2019 CLINICAL DATA:  Poly trauma, critical, head/cervical spine injury suspected. Ataxia, head trauma. Additional history provided: Patient has fallen multiple times in the past week. EXAM: CT HEAD WITHOUT CONTRAST CT CERVICAL SPINE WITHOUT CONTRAST TECHNIQUE: Multidetector CT imaging of the head and cervical spine was performed following the standard protocol without intravenous contrast. Multiplanar CT image reconstructions of the cervical spine were  also generated. COMPARISON:  Head CT 06/15/2019, report from CT cervical spine 07/17/2011 (images unavailable) FINDINGS: CT HEAD FINDINGS Brain: The examination is mildly motion degraded. Redemonstrated small focus of cortical/subcortical encephalomalacia within the right frontal lobe which may reflect a remote infarct. Background mild ill-defined hypoattenuation within the cerebral white matter is nonspecific, but consistent with chronic small vessel ischemic disease. Stable, moderate generalized parenchymal atrophy. There is no acute intracranial hemorrhage. No extra-axial fluid collection. No evidence of intracranial mass. No midline shift. Vascular: No hyperdense vessel.  Atherosclerotic calcifications. Skull: Normal. Negative for fracture or focal lesion. Sinuses/Orbits: Visualized orbits show no acute finding. No significant paranasal sinus disease or mastoid effusion at the imaged levels. CT CERVICAL SPINE FINDINGS Alignment: C3-C4 and C4-C5 grade 1 anterolisthesis. Skull base and vertebrae: The basion-dental and atlanto-dental intervals are maintained.No evidence of acute fracture to the cervical spine. Congenital nonunion of the posterior arch of C1. Soft tissues and spinal canal: No prevertebral fluid or swelling. No visible canal hematoma. Disc levels: Cervical spondylosis. Most notably at C5-C6 there is moderate/advanced disc height loss with posterior disc osteophyte complexes and uncovertebral hypertrophy. Upper chest: Separately reported. There is a small right apical pneumothorax. Partially imaged left pleural effusion. Other: Subcentimeter thyroid nodules not meeting consensus criteria for ultrasound follow-up. Findings of right apical pneumothorax called by telephone at the time of interpretation on 06/18/2019 at 3:30 pm to provider PA [place], who verbally acknowledged these results. IMPRESSION: CT head: 1. Mildly motion degraded examination. 2. No evidence of acute intracranial abnormality. 3.  Redemonstrated small focus of encephalomalacia within the right frontal lobe which may reflect a remote infarct. 4. Stable generalized parenchymal atrophy and chronic small vessel ischemic disease. CT cervical spine: 1. No evidence of acute fracture to the cervical spine. 2. C3-C4 and C4-C5 grade 1 anterolisthesis, possibly degenerative. 3. Cervical spondylosis, greatest at C5-C6 and C6-C7. 4. Right apical pneumothorax. Please refer to separately reported chest CT for additional intrathoracic findings. Electronically Signed   By: Kellie Simmering DO   On: 06/18/2019 15:32   CT Head Wo Contrast  Result Date: 06/15/2019 CLINICAL DATA:  Fall, head trauma EXAM: CT HEAD WITHOUT CONTRAST TECHNIQUE: Contiguous axial images were obtained  from the base of the skull through the vertex without intravenous contrast. COMPARISON:  06/01/2011 FINDINGS: Brain: No evidence of acute infarction, hemorrhage, hydrocephalus, extra-axial collection or mass lesion/mass effect. Periventricular and deep white matter hypodensity with redemonstrated encephalomalacia of the right frontal lobe. Global volume loss. Vascular: No hyperdense vessel or unexpected calcification. Skull: Normal. Negative for fracture or focal lesion. Sinuses/Orbits: No acute finding. Other: None. IMPRESSION: 1.  No acute intracranial pathology. 2. Advanced small-vessel white matter disease and global volume loss. Unchanged encephalomalacia of the right frontal lobe. Electronically Signed   By: Eddie Candle M.D.   On: 06/15/2019 14:03   CT Chest Wo Contrast  Result Date: 06/18/2019 CLINICAL DATA:  Chest pain and shortness of breath. Bilateral pleural effusions. EXAM: CT CHEST WITHOUT CONTRAST TECHNIQUE: Multidetector CT imaging of the chest was performed following the standard protocol without IV contrast. COMPARISON:  Chest x-rays dated 06/18/2019 and 06/10/2019 and chest CT dated 06/15/2019 FINDINGS: Cardiovascular: Chronic cardiomegaly. Previous aortic root repair.  Unchanged dilatation of the aortic arch to a diameter of 4 cm, stable. No pericardial effusion. Pacemaker in place. CABG. Mediastinum/Nodes: Multinodular goiter, unchanged. Trachea is normal. Esophagus appears normal. No appreciable hilar or mediastinal or axillary adenopathy. Lungs/Pleura: Persistent large left effusion and small right effusion, essentially unchanged. Minimal compressive atelectasis at the lung bases, stable. No acute infiltrates. Stable nodule in the right middle lobe, 5 x 7 mm. Upper Abdomen: No acute abnormality. Musculoskeletal: Stable mild compression fracture of T3, unchanged. Marked chronic accentuation of the thoracic kyphosis. No acute bone abnormality. IMPRESSION: 1. Stable large left effusion and small right effusion. 2. Stable cardiomegaly. 3. Stable dilatation of the aortic arch to a diameter of 4 cm. 4. Stable right middle lobe nodule. 5. Stable mild compression fracture of T3. 6. Marked chronic accentuation of the thoracic kyphosis. 7. Aortic atherosclerosis. Aortic Atherosclerosis (ICD10-I70.0). Electronically Signed   By: Lorriane Shire M.D.   On: 06/18/2019 15:32   CT Chest Wo Contrast  Result Date: 06/15/2019 CLINICAL DATA:  Post trauma with left anterior chest wall tenderness after fall last night. EXAM: CT CHEST WITHOUT CONTRAST TECHNIQUE: Multidetector CT imaging of the chest was performed following the standard protocol without IV contrast. COMPARISON:  None. FINDINGS: Cardiovascular: Prior median sternotomy. Patient is moderately kyphotic mild cardiomegaly. Calcified plaque over the coronary arteries. Mild aneurysmal dilatation of the proximal aspect of the aortic arch measuring 4.1 cm in AP diameter. Descending thoracic aorta is normal in caliber. Evidence of previous aortic root repair. Moderate calcified plaque throughout the thoracic aorta. Common takeoff of the right brachiocephalic and left common carotid arteries. Remaining vascular structures are unremarkable.  Mediastinum/Nodes: 1.2 cm precarinal lymph node likely reactive. No other significant mediastinal or hilar adenopathy. Remaining mediastinal structures are unremarkable. Lungs/Pleura: Lungs are adequately inflated demonstrate a moderate size left effusion and small right effusion with associated dependent atelectasis. There is an oval 8 x 5 mm nodule over the right middle lobe. Airways are unremarkable. Upper Abdomen: Prior cholecystectomy. Calcified plaque over the abdominal aorta. Musculoskeletal: No definite rib fracture. Moderate kyphosis of the thoracic spine with degenerative changes throughout the spine. Mild compression fracture of T3 age indeterminate. IMPRESSION: 1.  No acute fracture. 2. Moderate left effusion and small right effusion with associated dependent atelectasis. 3. Postsurgical change compatible previous aortic root repair. Mild aneurysmal dilatation of the proximal arch measuring 4.1 cm in diameter. Recommend annual imaging followup by CTA or MRA. This recommendation follows 2010 ACCF/AHA/AATS/ACR/ASA/SCA/SCAI/SIR/STS/SVM Guidelines for the Diagnosis and Management  of Patients with Thoracic Aortic Disease. Circulation. 2010; 121: Z601-U932. Aortic aneurysm NOS (ICD10-I71.9). 4. Aortic Atherosclerosis (ICD10-I70.0). Aortic aneurysm NOS (ICD10-I71.9). Atherosclerotic coronary artery disease. 5. 6.5 mm nodule over the right middle lobe. 1.2 cm precarinal lymph node likely reactive. Non-contrast chest CT at 6-12 months is recommended. If the nodule is stable at time of repeat CT, then future CT at 18-24 months (from today's scan) is considered optional for low-risk patients, but is recommended for high-risk patients. This recommendation follows the consensus statement: Guidelines for Management of Incidental Pulmonary Nodules Detected on CT Images: From the Fleischner Society 2017; Radiology 2017; 284:228-243. 6. Mild T3 compression fracture age indeterminate. Moderate kyphosis of the thoracic  spine. Electronically Signed   By: Marin Olp M.D.   On: 06/15/2019 14:22   CT Cervical Spine Wo Contrast  Result Date: 06/18/2019 CLINICAL DATA:  Poly trauma, critical, head/cervical spine injury suspected. Ataxia, head trauma. Additional history provided: Patient has fallen multiple times in the past week. EXAM: CT HEAD WITHOUT CONTRAST CT CERVICAL SPINE WITHOUT CONTRAST TECHNIQUE: Multidetector CT imaging of the head and cervical spine was performed following the standard protocol without intravenous contrast. Multiplanar CT image reconstructions of the cervical spine were also generated. COMPARISON:  Head CT 06/15/2019, report from CT cervical spine 07/17/2011 (images unavailable) FINDINGS: CT HEAD FINDINGS Brain: The examination is mildly motion degraded. Redemonstrated small focus of cortical/subcortical encephalomalacia within the right frontal lobe which may reflect a remote infarct. Background mild ill-defined hypoattenuation within the cerebral white matter is nonspecific, but consistent with chronic small vessel ischemic disease. Stable, moderate generalized parenchymal atrophy. There is no acute intracranial hemorrhage. No extra-axial fluid collection. No evidence of intracranial mass. No midline shift. Vascular: No hyperdense vessel.  Atherosclerotic calcifications. Skull: Normal. Negative for fracture or focal lesion. Sinuses/Orbits: Visualized orbits show no acute finding. No significant paranasal sinus disease or mastoid effusion at the imaged levels. CT CERVICAL SPINE FINDINGS Alignment: C3-C4 and C4-C5 grade 1 anterolisthesis. Skull base and vertebrae: The basion-dental and atlanto-dental intervals are maintained.No evidence of acute fracture to the cervical spine. Congenital nonunion of the posterior arch of C1. Soft tissues and spinal canal: No prevertebral fluid or swelling. No visible canal hematoma. Disc levels: Cervical spondylosis. Most notably at C5-C6 there is moderate/advanced disc  height loss with posterior disc osteophyte complexes and uncovertebral hypertrophy. Upper chest: Separately reported. There is a small right apical pneumothorax. Partially imaged left pleural effusion. Other: Subcentimeter thyroid nodules not meeting consensus criteria for ultrasound follow-up. Findings of right apical pneumothorax called by telephone at the time of interpretation on 06/18/2019 at 3:30 pm to provider PA [place], who verbally acknowledged these results. IMPRESSION: CT head: 1. Mildly motion degraded examination. 2. No evidence of acute intracranial abnormality. 3. Redemonstrated small focus of encephalomalacia within the right frontal lobe which may reflect a remote infarct. 4. Stable generalized parenchymal atrophy and chronic small vessel ischemic disease. CT cervical spine: 1. No evidence of acute fracture to the cervical spine. 2. C3-C4 and C4-C5 grade 1 anterolisthesis, possibly degenerative. 3. Cervical spondylosis, greatest at C5-C6 and C6-C7. 4. Right apical pneumothorax. Please refer to separately reported chest CT for additional intrathoracic findings. Electronically Signed   By: Kellie Simmering DO   On: 06/18/2019 15:32   US RENAL  Result Date: 06/21/2019 CLINICAL DATA:  Acute kidney injury EXAM: RENAL / URINARY TRACT ULTRASOUND COMPLETE COMPARISON:  None. FINDINGS: Right Kidney: Surgically absent.  No lesions seen in right pararenal fossa region. Left Kidney: Renal measurements:  9.4 x 3.7 x 4.5 cm = volume: 83.0 mL. Echogenicity and renal cortical thickness are within normal limits. No mass, perinephric fluid, or hydronephrosis visualized. No sonographically demonstrable calculus or ureterectasis. Bladder: Appears normal for degree of bladder distention. Other: None. IMPRESSION: Normal appearing left kidney.  Right kidney absent. Electronically Signed   By: Lowella Grip III M.D.   On: 06/21/2019 10:38   CT Hip Right Wo Contrast  Result Date: 06/18/2019 CLINICAL DATA:  Severe right  hip pain. Multiple recent falls. Negative radiographs on 06/18/2019 EXAM: CT OF THE RIGHT HIP WITHOUT CONTRAST TECHNIQUE: Multidetector CT imaging of the right hip was performed according to the standard protocol. Multiplanar CT image reconstructions were also generated. COMPARISON:  Radiographs dated 06/18/2019 and CT scan of the abdomen dated 10/17/2016 FINDINGS: Bones/Joint/Cartilage There is a slightly angulated fracture of the intertrochanteric region of the proximal right femur. Moderate osteoarthritis of the right hip joint. No dislocation. The visualized pelvic bones are intact. Muscles and Tendons Negative. Soft tissues Slight subcutaneous edema and a tiny subcutaneous hematoma lateral to the right greater trochanter. This is consistent with soft tissue contusion. IMPRESSION: 1. Slightly angulated fracture of the intertrochanteric region of the proximal right femur. 2. Moderate osteoarthritis of the right hip joint. 3. Soft tissue contusion lateral to the right greater trochanter. Electronically Signed   By: Lorriane Shire M.D.   On: 06/18/2019 15:24   DG Chest Port 1 View  Result Date: 06/22/2019 CLINICAL DATA:  Hypoxia. Postop from repair of right hip fracture. EXAM: PORTABLE CHEST 1 VIEW COMPARISON:  06/19/2019 FINDINGS: Patient is rotated to the left. Cardiomegaly remains stable. Aortic valve replacement noted. Dual lead transvenous pacemaker remains in appropriate position Diffuse interstitial infiltrates with central perihilar predominance show no significant, suspicious for pulmonary edema. Left lower lobe atelectasis or consolidation remains stable. IMPRESSION: 1. Stable cardiomegaly and diffuse interstitial infiltrates/edema. 2. Stable left lower lobe atelectasis or consolidation. Electronically Signed   By: Marlaine Hind M.D.   On: 06/22/2019 13:04   DG CHEST PORT 1 VIEW  Result Date: 06/19/2019 CLINICAL DATA:  Pneumothorax. EXAM: PORTABLE CHEST 1 VIEW COMPARISON:  CT cervical spine and chest  06/18/2019. Chest x-ray 06/18/2019. FINDINGS: Cardiac pacer stable position. Prior CABG. Cardiomegaly. Persistent left lower lobe atelectatic changes. Bilateral pulmonary infiltrates/edema. Bilateral pleural effusions. CHF cannot be excluded. Tiny previously identified right apical pneumothorax not identified by this chest x-ray. Plate and screw fixation left humerus again noted. IMPRESSION: 1. Cardiac pacer stable position. Prior CABG. Cardiomegaly. Bilateral pulmonary infiltrates/edema. Bilateral pleural effusions. CHF cannot be excluded. 2.  Persistent left lower lobe atelectatic changes. 3. Tiny previously identified right apical pneumothorax not identified by this chest x-ray. Electronically Signed   By: Marcello Moores  Register   On: 06/19/2019 08:01   DG Chest Port 1 View  Result Date: 06/18/2019 CLINICAL DATA:  Shortness of breath and chest pain EXAM: PORTABLE CHEST 1 VIEW COMPARISON:  June 10, 2019 chest radiograph and chest CT Jun 15, 2019 FINDINGS: There are persistent pleural effusions bilaterally, larger on the left than on the right, stable. There is compressive atelectasis in the left lower lobe with questionable superimposed pneumonia, stable. No new opacity evident. Heart is upper normal in size with pacemaker leads attached to right atrium and right ventricle. No adenopathy. Status post internal mammary bypass grafting. There is aortic atherosclerosis. Postoperative changes noted in the proximal left humerus. Bones are osteoporotic. IMPRESSION: Pleural effusions bilaterally, larger on the left than on the right, stable. Probable compressive atelectasis  with questionable superimposed pneumonia left base, stable. No new opacity evident. Stable cardiac silhouette. Pacemaker leads attached to right atrium and right ventricle. Aortic Atherosclerosis (ICD10-I70.0).  Bones osteoporotic. Electronically Signed   By: Lowella Grip III M.D.   On: 06/18/2019 11:27   DG C-Arm 1-60 Min-No Report  Result Date:  06/19/2019 Fluoroscopy was utilized by the requesting physician.  No radiographic interpretation.   CUP PACEART REMOTE DEVICE CHECK  Result Date: 06/02/2019 Scheduled remote reviewed. Normal device function.  Next remote 91 days. Felisa Bonier, RN, MSN  DG HIP UNILAT WITH PELVIS 2-3 VIEWS LEFT  Result Date: 06/15/2019 CLINICAL DATA:  Left knee pain, fall EXAM: DG HIP (WITH OR WITHOUT PELVIS) 2-3V LEFT COMPARISON:  None. FINDINGS: There is no acute fracture or dislocation. Degenerative changes are present. Vascular calcification is noted. IMPRESSION: No acute fracture. Electronically Signed   By: Macy Mis M.D.   On: 06/15/2019 13:20   DG HIP UNILAT WITH PELVIS 2-3 VIEWS RIGHT  Result Date: 06/18/2019 CLINICAL DATA:  Multiple recent falls, moderate to severe RIGHT hip pain. EXAM: DG HIP (WITH OR WITHOUT PELVIS) 2-3V RIGHT COMPARISON:  Plain film of the pelvis and LEFT hip dated 06/15/2019. Plain film of the pelvis dated 08/14/2017. FINDINGS: Osseous alignment is normal. Diffuse osteopenia limits characterization of osseous detail, however, there is no fracture line or displaced fracture fragment identified. Mild degenerative change at each hip. Atherosclerotic vascular calcifications within the bilateral upper thighs. Soft tissues about the pelvis and RIGHT hip are otherwise unremarkable IMPRESSION: No acute findings. No osseous fracture or dislocation. Osteopenia. Electronically Signed   By: Franki Cabot M.D.   On: 06/18/2019 11:30   DG FEMUR 1V RIGHT  Result Date: 06/19/2019 CLINICAL DATA:  Right hip ORIF EXAM: RIGHT FEMUR 1 VIEW; DG C-ARM 1-60 MIN-NO REPORT COMPARISON:  06/18/2019 FINDINGS: 5 C-arm fluoroscopic images were obtained intraoperatively and submitted for post operative interpretation. Images demonstrate interval placement of anterograde short intramedullary rod with single distal interlocking screw and proximal lag screw fixating a nondisplaced intertrochanteric fracture of the proximal  right femur. Alignment is anatomic. Osteoarthritis of the right hip. 28 seconds of fluoroscopy time was utilized. Please see the performing provider's procedural report for further detail. IMPRESSION: As above. Electronically Signed   By: Davina Poke D.O.   On: 06/19/2019 12:46   ECHOCARDIOGRAM LIMITED  Result Date: 06/02/2019    ECHOCARDIOGRAM LIMITED REPORT   Patient Name:   TANEAH MASRI Date of Exam: 05/30/2019 Medical Rec #:  [id number]     Height:       63.0 in Accession #:    [id number]    Weight:       88.0 lb Date of Birth:  January 07, 1923      BSA:          1.364 m Patient Age:    9 years      BP:           149/68 mmHg Patient Gender: F             HR:           66 bpm. Exam Location:  Inpatient Procedure: Limited Echo, Cardiac Doppler and Color Doppler Indications:    Congestive Heart Failure 428.0/I50.9  History:        Patient has prior history of Echocardiogram examinations, most                 recent 01/14/2019. CHF, CAD, Pacemaker, Arrythmias:Atrial  Fibrillation and Atrial Flutter; Risk Factors:Hypertension.                 Second degree AV block. Complete heart block. S/p ascending                 aortic aneurysm.  Sonographer:    Clayton Lefort RDCS (AE) Referring Phys: [id number] Villarin R BENSIMHON IMPRESSIONS  1. There is profound pacing-induced dyssynchrony.. Left ventricular ejection fraction, by estimation, is 45 to 50%. The left ventricle has mildly decreased function. The left ventricle has no regional wall motion abnormalities. There is mild concentric left ventricular hypertrophy. Left ventricular diastolic parameters are consistent with Grade II diastolic dysfunction (pseudonormalization). Elevated left atrial pressure.  2. Large pleural effusion in the left lateral region.  3. The mitral valve is normal in structure. Moderate mitral valve regurgitation.  4. Tricuspid valve regurgitation is moderate to severe.  5. Medtronic Freestyle root replacement 1999. The aortic valve has  been repaired/replaced. Aortic valve regurgitation is mild. Echo findings are consistent with normal structure and function of the aortic valve prosthesis.  6. There is borderline dilatation of the aortic root measuring 40 mm.  7. There is moderately elevated pulmonary artery systolic pressure.  8. The inferior vena cava is normal in size with <50% respiratory variability, suggesting right atrial pressure of 8 mmHg. Comparison(s): No significant change from prior study. Prior images reviewed side by side. LVEF was also 45-50% on the previous echo. FINDINGS  Left Ventricle: There is profound pacing-induced dyssynchrony. Left ventricular ejection fraction, by estimation, is 45 to 50%. The left ventricle has mildly decreased function. The left ventricle has no regional wall motion abnormalities. The left ventricular internal cavity size was normal in size. There is mild concentric left ventricular hypertrophy. Abnormal (paradoxical) septal motion, consistent with RV pacemaker. Elevated left atrial pressure. Right Ventricle: There is moderately elevated pulmonary artery systolic pressure. The tricuspid regurgitant velocity is 3.83 m/s, and with an assumed right atrial pressure of 8 mmHg, the estimated right ventricular systolic pressure is 68.3 mmHg. Pericardium: There is no evidence of pericardial effusion. Mitral Valve: The mitral valve is normal in structure. There is mild thickening of the mitral valve leaflet(s). Mild mitral annular calcification. Moderate mitral valve regurgitation, with centrally-directed jet. MV peak gradient, 6.1 mmHg. The mean mitral valve gradient is 2.0 mmHg. Tricuspid Valve: The tricuspid valve is normal in structure. Tricuspid valve regurgitation is moderate to severe. Aortic Valve: Medtronic Freestyle root replacement 1999. The aortic valve has been repaired/replaced. Aortic valve regurgitation is mild. Aortic regurgitation PHT measures 437 msec. Aortic valve mean gradient measures 5.9  mmHg. Aortic valve peak gradient  measures 10.8 mmHg. Aortic valve area, by VTI measures 1.41 cm. Echo findings are consistent with normal structure and function of the aortic valve prosthesis. Pulmonic Valve: The pulmonic valve was normal in structure. Pulmonic valve regurgitation is mild. Aorta: There is borderline dilatation of the aortic root measuring 40 mm. Venous: The inferior vena cava is normal in size with less than 50% respiratory variability, suggesting right atrial pressure of 8 mmHg. Additional Comments: A pacer wire is visualized. There is a large pleural effusion in the left lateral region.  LEFT VENTRICLE PLAX 2D LVIDd:         3.90 cm      Diastology LVIDs:         2.60 cm      LV e' lateral:   4.45 cm/s LV PW:  1.30 cm      LV E/e' lateral: 24.0 LV IVS:        1.30 cm      LV e' medial:    3.43 cm/s LVOT diam:     2.00 cm      LV E/e' medial:  31.2 LV SV:         55 LV SV Index:   40 LVOT Area:     3.14 cm  LV Volumes (MOD) LV vol d, MOD A2C: 101.0 ml LV vol d, MOD A4C: 105.0 ml LV vol s, MOD A2C: 56.6 ml LV vol s, MOD A4C: 54.9 ml LV SV MOD A2C:     44.4 ml LV SV MOD A4C:     105.0 ml LV SV MOD BP:      47.4 ml IVC IVC diam: 1.40 cm LEFT ATRIUM         Index LA diam:    4.50 cm 3.30 cm/m  AORTIC VALVE AV Area (Vmax):    1.50 cm AV Area (Vmean):   1.33 cm AV Area (VTI):     1.41 cm AV Vmax:           164.45 cm/s AV Vmean:          113.810 cm/s AV VTI:            0.391 m AV Peak Grad:      10.8 mmHg AV Mean Grad:      5.9 mmHg LVOT Vmax:         78.30 cm/s LVOT Vmean:        48.100 cm/s LVOT VTI:          0.175 m LVOT/AV VTI ratio: 0.45 AI PHT:            437 msec  AORTA Ao Root diam: 4.00 cm Ao Asc diam:  3.90 cm MITRAL VALVE                 TRICUSPID VALVE MV Area (PHT): 4.39 cm      TR Peak grad:   58.7 mmHg MV Peak grad:  6.1 mmHg      TR Vmax:        383.00 cm/s MV Mean grad:  2.0 mmHg MV Vmax:       1.23 m/s      SHUNTS MV Vmean:      70.5 cm/s     Systemic VTI:  0.18 m MV Decel  Time: 173 msec      Systemic Diam: 2.00 cm MR Peak grad:    145.4 mmHg MR Mean grad:    84.0 mmHg MR Vmax:         603.00 cm/s MR Vmean:        421.0 cm/s MR PISA:         3.08 cm MR PISA Eff ROA: 16 mm MR PISA Radius:  0.70 cm MV E velocity: 107.00 cm/s MV A velocity: 67.30 cm/s MV E/A ratio:  1.59 Mihai Croitoru MD Electronically signed by Sanda Klein MD Signature Date/Time: 06/02/2019/2:05:38 PM    Final       The results of significant diagnostics from this hospitalization (including imaging, microbiology, ancillary and laboratory) are listed below for reference.     Microbiology: Recent Results (from the past 240 hour(s))  Respiratory Panel by RT PCR (Flu A&B, Covid) - Nasopharyngeal Swab     Status: None   Collection Time: 06/18/19  3:21 PM   Specimen: Nasopharyngeal Swab  Result Value Ref Range Status   SARS Coronavirus 2 by RT PCR NEGATIVE NEGATIVE Final    Comment: (NOTE) SARS-CoV-2 target nucleic acids are NOT DETECTED. The SARS-CoV-2 RNA is generally detectable in upper respiratoy specimens during the acute phase of infection. The lowest concentration of SARS-CoV-2 viral copies this assay can detect is 131 copies/mL. A negative result does not preclude SARS-Cov-2 infection and should not be used as the sole basis for treatment or other patient management decisions. A negative result may occur with  improper specimen collection/handling, submission of specimen other than nasopharyngeal swab, presence of viral mutation(s) within the areas targeted by this assay, and inadequate number of viral copies (<131 copies/mL). A negative result must be combined with clinical observations, patient history, and epidemiological information. The expected result is Negative. Fact Sheet for Patients:  PinkCheek.be Fact Sheet for Healthcare Providers:  GravelBags.it This test is not yet ap proved or cleared by the [place] FDA  and  has been authorized for detection and/or diagnosis of SARS-CoV-2 by FDA under an Emergency Use Authorization (EUA). This EUA will remain  in effect (meaning this test can be used) for the duration of the COVID-19 declaration under Section 564(b)(1) of the Act, 21 U.S.C. section 360bbb-3(b)(1), unless the authorization is terminated or revoked sooner.    Influenza A by PCR NEGATIVE NEGATIVE Final   Influenza B by PCR NEGATIVE NEGATIVE Final    Comment: (NOTE) The Xpert Xpress SARS-CoV-2/FLU/RSV assay is intended as an aid in  the diagnosis of influenza from Nasopharyngeal swab specimens and  should not be used as a sole basis for treatment. Nasal washings and  aspirates are unacceptable for Xpert Xpress SARS-CoV-2/FLU/RSV  testing. Fact Sheet for Patients: PinkCheek.be Fact Sheet for Healthcare Providers: GravelBags.it This test is not yet approved or cleared by the [place] FDA and  has been authorized for detection and/or diagnosis of SARS-CoV-2 by  FDA under an Emergency Use Authorization (EUA). This EUA will remain  in effect (meaning this test can be used) for the duration of the  Covid-19 declaration under Section 564(b)(1) of the Act, 21  U.S.C. section 360bbb-3(b)(1), unless the authorization is  terminated or revoked. Performed at [facility], [place] [address] [place], [place] [address]   SARS CORONAVIRUS 2 (TAT 6-24 HRS) Nasopharyngeal Nasopharyngeal Swab     Status: None   Collection Time: 06/23/19  3:57 PM   Specimen: Nasopharyngeal Swab  Result Value Ref Range Status   SARS Coronavirus 2 NEGATIVE NEGATIVE Final    Comment: (NOTE) SARS-CoV-2 target nucleic acids are NOT DETECTED. The SARS-CoV-2 RNA is generally detectable in upper and lower respiratory specimens during the acute phase of infection. Negative results do not preclude SARS-CoV-2 infection, do not rule out co-infections  with other pathogens, and should not be used as the sole basis for treatment or other patient management decisions. Negative results must be combined with clinical observations, patient history, and epidemiological information. The expected result is Negative. Fact Sheet for Patients: SugarRoll.be Fact Sheet for Healthcare Providers: https://www.woods-mathews.com/ This test is not yet approved or cleared by the [place] FDA and  has been authorized for detection and/or diagnosis of SARS-CoV-2 by FDA under an Emergency Use Authorization (EUA). This EUA will remain  in effect (meaning this test can be used) for the duration of the COVID-19 declaration under Section 56 4(b)(1) of the Act, 21 U.S.C. section 360bbb-3(b)(1), unless the authorization is terminated or revoked sooner. Performed at [facility],  [address] [place], [place] [address]      Labs: BNP (last 3 results) Recent Labs    04/14/19 1054 06/18/19 1048 06/22/19 1506  BNP 552* 941.2* 9,211.9*   Basic Metabolic Panel: Recent Labs  Lab 06/20/19 0442 06/21/19 0603 06/22/19 0502 06/23/19 0258 06/24/19 0531  NA 137 134* 137 138 140  140  K 4.3 4.5 4.7 4.4 4.8  4.8  CL 101 98 103 106 109  110  CO2 26 25 24 23 22  23   GLUCOSE 110* 121* 111* 115* 111*  111*  BUN 58* 69* 72* 73* 64*  65*  CREATININE 2.63* 3.04* 2.68* 2.55* 1.98*  2.07*  CALCIUM 8.4* 8.4* 8.2* 8.2* 8.4*  8.4*  MG  --  2.1 2.3 2.1 2.2  PHOS  --  4.7* 4.6 4.2 3.7   Liver Function Tests: Recent Labs  Lab 06/18/19 1048 06/18/19 1048 06/19/19 0508 06/21/19 0603 06/22/19 0502 06/23/19 0258 06/24/19 0531  AST 23  --  20  --   --   --   --   ALT 20  --  22  --   --   --   --   ALKPHOS 102  --  102  --   --   --   --   BILITOT 0.9  --  1.2  --   --   --   --   PROT 6.5  --  6.4*  --   --   --   --   ALBUMIN 3.3*   < > 3.2* 3.1* 2.9* 2.9* 2.8*   < > = values in this interval not  displayed.   No results for input(s): LIPASE, AMYLASE in the last 168 hours. Recent Labs  Lab 06/22/19 1506  AMMONIA 18   CBC: Recent Labs  Lab 06/18/19 1048 06/19/19 0508 06/20/19 0442 06/21/19 0603 06/22/19 0502  WBC 7.4 10.4 14.1* 11.1* 9.4  NEUTROABS 5.4  --   --   --   --   HGB 10.4* 10.3* 9.0* 8.7* 8.7*  HCT 33.1* 32.9* 29.0* 27.0* 26.7*  MCV 105.1* 106.1* 108.2* 101.5* 101.5*  PLT 221 208 189 198 223   Cardiac Enzymes: No results for input(s): CKTOTAL, CKMB, CKMBINDEX, TROPONINI in the last 168 hours. BNP: Invalid input(s): POCBNP CBG: No results for input(s): GLUCAP in the last 168 hours. D-Dimer No results for input(s): DDIMER in the last 72 hours. Hgb A1c No results for input(s): HGBA1C in the last 72 hours. Lipid Profile No results for input(s): CHOL, HDL, LDLCALC, TRIG, CHOLHDL, LDLDIRECT in the last 72 hours. Thyroid function studies Recent Labs    06/22/19 1506  TSH 0.544   Anemia work up No results for input(s): VITAMINB12, FOLATE, FERRITIN, TIBC, IRON, RETICCTPCT in the last 72 hours. Urinalysis    Component Value Date/Time   COLORURINE STRAW (A) 01/22/2019 1930   APPEARANCEUR CLEAR 01/22/2019 1930   LABSPEC 1.006 01/22/2019 1930   PHURINE 7.0 01/22/2019 1930   GLUCOSEU NEGATIVE 01/22/2019 1930   HGBUR NEGATIVE 01/22/2019 1930   [place] NEGATIVE 01/22/2019 [place] NEGATIVE 01/22/2019 1930   PROTEINUR NEGATIVE 01/22/2019 1930   NITRITE NEGATIVE 01/22/2019 1930   LEUKOCYTESUR NEGATIVE 01/22/2019 1930   Sepsis Labs Invalid input(s): PROCALCITONIN,  WBC,  LACTICIDVEN Microbiology Recent Results (from the past 240 hour(s))  Respiratory Panel by RT PCR (Flu A&B, Covid) - Nasopharyngeal Swab     Status: None   Collection Time: 06/18/19  3:21 PM   Specimen:  Nasopharyngeal Swab  Result Value Ref Range Status   SARS Coronavirus 2 by RT PCR NEGATIVE NEGATIVE Final    Comment: (NOTE) SARS-CoV-2 target nucleic acids are NOT  DETECTED. The SARS-CoV-2 RNA is generally detectable in upper respiratoy specimens during the acute phase of infection. The lowest concentration of SARS-CoV-2 viral copies this assay can detect is 131 copies/mL. A negative result does not preclude SARS-Cov-2 infection and should not be used as the sole basis for treatment or other patient management decisions. A negative result may occur with  improper specimen collection/handling, submission of specimen other than nasopharyngeal swab, presence of viral mutation(s) within the areas targeted by this assay, and inadequate number of viral copies (<131 copies/mL). A negative result must be combined with clinical observations, patient history, and epidemiological information. The expected result is Negative. Fact Sheet for Patients:  PinkCheek.be Fact Sheet for Healthcare Providers:  GravelBags.it This test is not yet ap proved or cleared by the [place] FDA and  has been authorized for detection and/or diagnosis of SARS-CoV-2 by FDA under an Emergency Use Authorization (EUA). This EUA will remain  in effect (meaning this test can be used) for the duration of the COVID-19 declaration under Section 564(b)(1) of the Act, 21 U.S.C. section 360bbb-3(b)(1), unless the authorization is terminated or revoked sooner.    Influenza A by PCR NEGATIVE NEGATIVE Final   Influenza B by PCR NEGATIVE NEGATIVE Final    Comment: (NOTE) The Xpert Xpress SARS-CoV-2/FLU/RSV assay is intended as an aid in  the diagnosis of influenza from Nasopharyngeal swab specimens and  should not be used as a sole basis for treatment. Nasal washings and  aspirates are unacceptable for Xpert Xpress SARS-CoV-2/FLU/RSV  testing. Fact Sheet for Patients: PinkCheek.be Fact Sheet for Healthcare Providers: GravelBags.it This test is not yet approved or cleared  by the [place] FDA and  has been authorized for detection and/or diagnosis of SARS-CoV-2 by  FDA under an Emergency Use Authorization (EUA). This EUA will remain  in effect (meaning this test can be used) for the duration of the  Covid-19 declaration under Section 564(b)(1) of the Act, 21  U.S.C. section 360bbb-3(b)(1), unless the authorization is  terminated or revoked. Performed at [facility], [place] [address] [place], [place] [address]   SARS CORONAVIRUS 2 (TAT 6-24 HRS) Nasopharyngeal Nasopharyngeal Swab     Status: None   Collection Time: 06/23/19  3:57 PM   Specimen: Nasopharyngeal Swab  Result Value Ref Range Status   SARS Coronavirus 2 NEGATIVE NEGATIVE Final    Comment: (NOTE) SARS-CoV-2 target nucleic acids are NOT DETECTED. The SARS-CoV-2 RNA is generally detectable in upper and lower respiratory specimens during the acute phase of infection. Negative results do not preclude SARS-CoV-2 infection, do not rule out co-infections with other pathogens, and should not be used as the sole basis for treatment or other patient management decisions. Negative results must be combined with clinical observations, patient history, and epidemiological information. The expected result is Negative. Fact Sheet for Patients: SugarRoll.be Fact Sheet for Healthcare Providers: https://www.woods-mathews.com/ This test is not yet approved or cleared by the [place] FDA and  has been authorized for detection and/or diagnosis of SARS-CoV-2 by FDA under an Emergency Use Authorization (EUA). This EUA will remain  in effect (meaning this test can be used) for the duration of the COVID-19 declaration under Section 56 4(b)(1) of the Act, 21 U.S.C. section 360bbb-3(b)(1), unless the authorization is terminated or revoked sooner. Performed at [facility], [place] [address], [place],  [address]      Time coordinating  discharge:  I have spent 35 minutes face to face with the patient and on the ward discussing the patients care, assessment, plan and disposition with other care givers. >50% of the time was devoted counseling the patient about the risks and benefits of treatment/Discharge disposition and coordinating care.   SIGNED:   Damita Lack, MD  [facility] 06/24/2019, 10:27 AM   If 7PM-7AM, please contact night-coverage

## 2019-06-24 NOTE — TOC Transition Note (Signed)
Transition of Care [facility]) - CM/SW Discharge Note   Patient Details  Name: Holly Bolton MRN: [id number] Date of Birth: 06-02-1922  [facility] CM/SW Contact:  Dessa Phi, RN Phone Number: 06/24/2019, 11:29 AM   Clinical Narrative: [facility] received d/c summary-goping to rm [phone],tel# for nsg report [phone]. [facility] called. No further CM needs.      Final next level of care: Skilled Nursing Facility Barriers to Discharge: No Barriers Identified   Patient Goals and CMS Choice Patient states their goals for this hospitalization and ongoing recovery are:: go to rehab CMS Medicare.gov Compare Post Acute Care list provided to:: Patient Represenative (must comment)([name] (son)) Choice offered to / list presented to : Adult Children  Discharge Placement   Existing PASRR number confirmed : 06/24/19          Patient chooses bed at: [facility] Patient to be transferred to facility by: [facility] Name of family member notified: [name] Patient and family notified of of transfer: 06/24/19  Discharge Plan and Services   Discharge Planning Services: CM Consult                                 Social Determinants of Health ([place]) Interventions     Readmission Risk Interventions Readmission Risk Prevention Plan 06/20/2019  Transportation Screening Complete  PCP or Specialist Appt within 3-5 Days Complete  HRI or [place] Complete  Social Work Consult for [employer] Planning/Counseling Complete  Palliative Care Screening Complete  Medication Review Press photographer) Complete  Some recent data might be hidden

## 2019-06-24 NOTE — Progress Notes (Signed)
Attempted to call report x2 with no answer at [facility]. Will reattempt shortly.

## 2019-06-24 NOTE — TOC Transition Note (Signed)
Transition of Care [facility]) - CM/SW Discharge Note   Patient Details  Name: Holly Bolton MRN: [id number] Date of Birth: [birth year]  Transition of Care [facility]) CM/SW Contact:  Dessa Phi, RN Phone Number: 06/24/2019, 9:03 AM   Clinical Narrative: d/c order,await d/c summary,Son chose [facility] SNF-rep Janie aware & has accepted,has a bed available,covid neg 5/10. PTAR once have a rm#,tel# for nsg report.      Final next level of care: Skilled Nursing Facility Barriers to Discharge: No Barriers Identified   Patient Goals and CMS Choice Patient states their goals for this hospitalization and ongoing recovery are:: go to rehab CMS Medicare.gov Compare Post Acute Care list provided to:: Patient Represenative (must comment)(Holly (son)) Choice offered to / list presented to : Adult Children  Discharge Placement   Existing PASRR number confirmed : 06/24/19          Patient chooses bed at: [facility] Patient to be transferred to facility by: [place] Name of family member notified: Holly Bolton Patient and family notified of of transfer: 06/24/19  Discharge Plan and Services   Discharge Planning Services: CM Consult                                 Social Determinants of Health ([name]) Interventions     Readmission Risk Interventions Readmission Risk Prevention Plan 06/20/2019  Transportation Screening Complete  PCP or Specialist Appt within 3-5 Days Complete  HRI or [place] Complete  Social Work Consult for [place] Planning/Counseling Complete  Palliative Care Screening Complete  Medication Review Press photographer) Complete  Some recent data might be hidden

## 2019-06-25 ENCOUNTER — Other Ambulatory Visit: Payer: Self-pay | Admitting: *Deleted

## 2019-06-25 DIAGNOSIS — S72091D Other fracture of head and neck of right femur, subsequent encounter for closed fracture with routine healing: Secondary | ICD-10-CM | POA: Diagnosis not present

## 2019-06-25 DIAGNOSIS — N184 Chronic kidney disease, stage 4 (severe): Secondary | ICD-10-CM | POA: Diagnosis not present

## 2019-06-25 DIAGNOSIS — I10 Essential (primary) hypertension: Secondary | ICD-10-CM | POA: Diagnosis not present

## 2019-06-25 DIAGNOSIS — I739 Peripheral vascular disease, unspecified: Secondary | ICD-10-CM | POA: Diagnosis not present

## 2019-06-25 DIAGNOSIS — T148XXA Other injury of unspecified body region, initial encounter: Secondary | ICD-10-CM | POA: Diagnosis not present

## 2019-06-25 DIAGNOSIS — I509 Heart failure, unspecified: Secondary | ICD-10-CM | POA: Diagnosis not present

## 2019-06-25 DIAGNOSIS — S80812D Abrasion, left lower leg, subsequent encounter: Secondary | ICD-10-CM | POA: Diagnosis not present

## 2019-06-25 DIAGNOSIS — I25810 Atherosclerosis of coronary artery bypass graft(s) without angina pectoris: Secondary | ICD-10-CM | POA: Diagnosis not present

## 2019-06-25 DIAGNOSIS — E039 Hypothyroidism, unspecified: Secondary | ICD-10-CM | POA: Diagnosis not present

## 2019-06-25 NOTE — Patient Outreach (Signed)
Member screened for potential [facility] Care Management needs as a benefit of [place] Medicare.  Mrs. Flett is receiving skilled therapy at [facility] SNF. Lived with family prior.   Will continue to follow for transition plans and for potential [facility] Care Management needs.    Marthenia Rolling, MSN-Ed, RN,BSN [place] Acute Care Coordinator [phone] [facility]) [phone]  (Toll free office)

## 2019-06-27 DIAGNOSIS — N179 Acute kidney failure, unspecified: Secondary | ICD-10-CM | POA: Diagnosis not present

## 2019-06-27 DIAGNOSIS — J9601 Acute respiratory failure with hypoxia: Secondary | ICD-10-CM | POA: Diagnosis not present

## 2019-06-27 DIAGNOSIS — N183 Chronic kidney disease, stage 3 unspecified: Secondary | ICD-10-CM | POA: Diagnosis not present

## 2019-06-27 DIAGNOSIS — S72001A Fracture of unspecified part of neck of right femur, initial encounter for closed fracture: Secondary | ICD-10-CM | POA: Diagnosis not present

## 2019-07-04 DIAGNOSIS — S72001A Fracture of unspecified part of neck of right femur, initial encounter for closed fracture: Secondary | ICD-10-CM | POA: Diagnosis not present

## 2019-07-04 DIAGNOSIS — N183 Chronic kidney disease, stage 3 unspecified: Secondary | ICD-10-CM | POA: Diagnosis not present

## 2019-07-04 DIAGNOSIS — N179 Acute kidney failure, unspecified: Secondary | ICD-10-CM | POA: Diagnosis not present

## 2019-07-04 DIAGNOSIS — J9601 Acute respiratory failure with hypoxia: Secondary | ICD-10-CM | POA: Diagnosis not present

## 2019-07-09 ENCOUNTER — Other Ambulatory Visit: Payer: Self-pay | Admitting: *Deleted

## 2019-07-09 DIAGNOSIS — S72144D Nondisplaced intertrochanteric fracture of right femur, subsequent encounter for closed fracture with routine healing: Secondary | ICD-10-CM | POA: Diagnosis not present

## 2019-07-09 DIAGNOSIS — Z9889 Other specified postprocedural states: Secondary | ICD-10-CM | POA: Diagnosis not present

## 2019-07-09 NOTE — Patient Outreach (Signed)
Screened for potential [facility] Care Management needs as a benefit of  [employer].  Mrs. Sidle is receiving skilled therapy at [facility] SN[facility].   Writer attended telephonic interdisciplinary team meeting to assess for disposition needs and transition plan for resident.   Facility reports member is not progressing with therapy. Son wants to bring member home at [facility] discharge. Facility SW to speak with son regarding transition plans. Suggested member could benefit from palliative care referral.   Will follow up with facility SW after her discussion with member's son.    Marthenia Rolling, MSN-Ed, RN,BSN [place] Acute Care Coordinator [phone] [facility]) [phone]  (Toll free office)

## 2019-07-10 ENCOUNTER — Other Ambulatory Visit: Payer: Self-pay | Admitting: *Deleted

## 2019-07-10 DIAGNOSIS — I509 Heart failure, unspecified: Secondary | ICD-10-CM | POA: Diagnosis not present

## 2019-07-10 DIAGNOSIS — I4891 Unspecified atrial fibrillation: Secondary | ICD-10-CM | POA: Diagnosis not present

## 2019-07-10 DIAGNOSIS — S72091D Other fracture of head and neck of right femur, subsequent encounter for closed fracture with routine healing: Secondary | ICD-10-CM | POA: Diagnosis not present

## 2019-07-10 DIAGNOSIS — I10 Essential (primary) hypertension: Secondary | ICD-10-CM | POA: Diagnosis not present

## 2019-07-10 DIAGNOSIS — N184 Chronic kidney disease, stage 4 (severe): Secondary | ICD-10-CM | POA: Diagnosis not present

## 2019-07-10 DIAGNOSIS — L89152 Pressure ulcer of sacral region, stage 2: Secondary | ICD-10-CM | POA: Diagnosis not present

## 2019-07-10 NOTE — Patient Outreach (Signed)
[facility] Post- Acute Care Coordinator follow up  Holly Bolton is receiving skilled therapy at [facility] SNF.   Communication sent to [facility] SW to follow up on her meeting with son regarding transition plans.   Will continue to follow for transition plans and potential [facility] needs.    Marthenia Rolling, MSN-Ed, RN,BSN [place] Acute Care Coordinator [phone] [facility]) [phone]  (Toll free office)

## 2019-07-15 ENCOUNTER — Encounter: Payer: Self-pay | Admitting: Internal Medicine

## 2019-07-16 ENCOUNTER — Other Ambulatory Visit: Payer: Medicare Other

## 2019-07-16 ENCOUNTER — Other Ambulatory Visit: Payer: Self-pay | Admitting: *Deleted

## 2019-07-16 ENCOUNTER — Telehealth: Payer: Self-pay

## 2019-07-16 NOTE — Telephone Encounter (Signed)
Nurse from nursing facility called to confirm that our office was patients PCP and of her up coming appointment

## 2019-07-16 NOTE — Patient Outreach (Signed)
Screened for potential [facility] Care Management needs as a benefit of  [employer].  Mrs. Luckenbaugh is receiving skilled therapy at [facility] SNF.  Writer attended telephonic interdisciplinary team meeting to assess for disposition needs and transition plan for resident.   Facility reports member is unable to ambulate and has poor intake. Suggested palliative care referral again.   Will plan outreach to member's son to discuss transition plans.   Marthenia Rolling, MSN-Ed, RN,BSN [place] Acute Care Coordinator [phone] [facility]) [phone]  (Toll free office)

## 2019-07-19 DIAGNOSIS — Z03818 Encounter for observation for suspected exposure to other biological agents ruled out: Secondary | ICD-10-CM | POA: Diagnosis not present

## 2019-07-21 ENCOUNTER — Encounter: Payer: Self-pay | Admitting: Internal Medicine

## 2019-07-21 ENCOUNTER — Other Ambulatory Visit: Payer: Self-pay

## 2019-07-21 ENCOUNTER — Ambulatory Visit (INDEPENDENT_AMBULATORY_CARE_PROVIDER_SITE_OTHER): Payer: Medicare Other | Admitting: Internal Medicine

## 2019-07-21 VITALS — BP 110/59 | HR 66 | Temp 97.0°F | Ht 63.0 in | Wt 84.0 lb

## 2019-07-21 DIAGNOSIS — I4821 Permanent atrial fibrillation: Secondary | ICD-10-CM

## 2019-07-21 DIAGNOSIS — E0590 Thyrotoxicosis, unspecified without thyrotoxic crisis or storm: Secondary | ICD-10-CM | POA: Diagnosis not present

## 2019-07-21 DIAGNOSIS — Z7189 Other specified counseling: Secondary | ICD-10-CM

## 2019-07-21 DIAGNOSIS — F05 Delirium due to known physiological condition: Secondary | ICD-10-CM | POA: Diagnosis not present

## 2019-07-21 DIAGNOSIS — K5904 Chronic idiopathic constipation: Secondary | ICD-10-CM

## 2019-07-21 DIAGNOSIS — L89152 Pressure ulcer of sacral region, stage 2: Secondary | ICD-10-CM

## 2019-07-21 DIAGNOSIS — S72144G Nondisplaced intertrochanteric fracture of right femur, subsequent encounter for closed fracture with delayed healing: Secondary | ICD-10-CM

## 2019-07-21 DIAGNOSIS — Z681 Body mass index (BMI) 19 or less, adult: Secondary | ICD-10-CM | POA: Diagnosis not present

## 2019-07-21 DIAGNOSIS — R54 Age-related physical debility: Secondary | ICD-10-CM | POA: Diagnosis not present

## 2019-07-21 DIAGNOSIS — R636 Underweight: Secondary | ICD-10-CM | POA: Diagnosis not present

## 2019-07-21 DIAGNOSIS — R638 Other symptoms and signs concerning food and fluid intake: Secondary | ICD-10-CM

## 2019-07-21 DIAGNOSIS — I2510 Atherosclerotic heart disease of native coronary artery without angina pectoris: Secondary | ICD-10-CM

## 2019-07-21 DIAGNOSIS — I5042 Chronic combined systolic (congestive) and diastolic (congestive) heart failure: Secondary | ICD-10-CM | POA: Diagnosis not present

## 2019-07-21 DIAGNOSIS — N184 Chronic kidney disease, stage 4 (severe): Secondary | ICD-10-CM | POA: Diagnosis not present

## 2019-07-21 LAB — BASIC METABOLIC PANEL
BUN/Creatinine Ratio: 32 (calc) — ABNORMAL HIGH (ref 6–22)
BUN: 76 mg/dL — ABNORMAL HIGH (ref 7–25)
CO2: 25 mmol/L (ref 20–32)
Calcium: 8.3 mg/dL — ABNORMAL LOW (ref 8.6–10.4)
Chloride: 100 mmol/L (ref 98–110)
Creat: 2.35 mg/dL — ABNORMAL HIGH (ref 0.60–0.88)
Glucose, Bld: 78 mg/dL (ref 65–139)
Potassium: 4.6 mmol/L (ref 3.5–5.3)
Sodium: 139 mmol/L (ref 135–146)

## 2019-07-21 LAB — CBC WITH DIFFERENTIAL/PLATELET
Absolute Monocytes: 306 cells/uL (ref 200–950)
Basophils Absolute: 9 cells/uL (ref 0–200)
Basophils Relative: 0.1 %
Eosinophils Absolute: 9 cells/uL — ABNORMAL LOW (ref 15–500)
Eosinophils Relative: 0.1 %
HCT: 29.5 % — ABNORMAL LOW (ref 35.0–45.0)
Hemoglobin: 9.7 g/dL — ABNORMAL LOW (ref 11.7–15.5)
Lymphs Abs: 225 cells/uL — ABNORMAL LOW (ref 850–3900)
MCH: 32.4 pg (ref 27.0–33.0)
MCHC: 32.9 g/dL (ref 32.0–36.0)
MCV: 98.7 fL (ref 80.0–100.0)
MPV: 12.6 fL — ABNORMAL HIGH (ref 7.5–12.5)
Monocytes Relative: 3.4 %
Neutro Abs: 8451 cells/uL — ABNORMAL HIGH (ref 1500–7800)
Neutrophils Relative %: 93.9 %
Platelets: 151 10*3/uL (ref 140–400)
RBC: 2.99 10*6/uL — ABNORMAL LOW (ref 3.80–5.10)
RDW: 16.2 % — ABNORMAL HIGH (ref 11.0–15.0)
Total Lymphocyte: 2.5 %
WBC: 9 10*3/uL (ref 3.8–10.8)

## 2019-07-21 NOTE — Progress Notes (Signed)
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 C-Arm 1-60 Min-No Report  Result Date: 06/19/2019 CLINICAL DATA:  Right hip ORIF EXAM: RIGHT FEMUR 1 VIEW; DG C-ARM 1-60 MIN-NO REPORT COMPARISON:  06/18/2019 FINDINGS: 5 C-arm fluoroscopic images were obtained intraoperatively and submitted for post operative interpretation. Images demonstrate interval placement of anterograde short intramedullary rod with single distal interlocking screw and proximal lag screw fixating a nondisplaced intertrochanteric fracture of the proximal right femur. Alignment is anatomic. Osteoarthritis of the right hip. 28 seconds of fluoroscopy time was utilized. Please see the performing provider's procedural report for further detail. IMPRESSION: As above. Electronically Signed   By: Davina Poke D.O.   On: 06/19/2019 12:46   DG HIP UNILAT WITH PELVIS 2-3 VIEWS RIGHT  Result Date: 06/18/2019 CLINICAL DATA:  Multiple recent falls, moderate to severe RIGHT hip pain. EXAM: DG HIP (WITH OR WITHOUT PELVIS) 2-3V RIGHT COMPARISON:  Plain film of the pelvis and LEFT hip dated 06/15/2019. Plain film of the pelvis dated 08/14/2017. FINDINGS: Osseous alignment is  normal. Diffuse osteopenia limits characterization of osseous detail, however, there is no fracture line or displaced fracture fragment identified. Mild degenerative change at each hip. Atherosclerotic vascular calcifications within the bilateral upper thighs. Soft tissues about the pelvis and RIGHT hip are otherwise unremarkable IMPRESSION: No acute findings. No osseous fracture or dislocation. Osteopenia. Electronically Signed   By: Franki Cabot M.D.   On: 06/18/2019 11:30   DG FEMUR 1V RIGHT  Result Date: 06/19/2019 CLINICAL DATA:  Right hip ORIF EXAM: RIGHT FEMUR 1 VIEW; DG C-ARM 1-60 MIN-NO REPORT COMPARISON:  06/18/2019 FINDINGS: 5 C-arm fluoroscopic images were obtained intraoperatively and submitted for post operative interpretation. Images demonstrate interval placement of anterograde short intramedullary rod with single distal interlocking screw and proximal lag screw fixating a nondisplaced intertrochanteric fracture of the proximal right femur. Alignment is anatomic. Osteoarthritis of the right hip. 28 seconds of fluoroscopy time was utilized. Please see the performing provider's procedural report for further detail. IMPRESSION: As above. Electronically Signed   By: Davina Poke D.O.   On: 06/19/2019 12:46    Assessment/Plan 1. Delirium superimposed on dementia - sounds like this has been waxing and waning since her fracture  - explained that this along with her pressure injuries, poor intake, chf, ckd, and hip fx itself all indicate a poor prognosis for her of less than 6 mos -recommended hospice care -Ed wants to "give her a chance" - labs ordered and may need fluids attempted at facility, but would not tolerate this long given her chf and afib so only a trial small volume as slow rate -he wants to bring her home and care for her there since she's not progressing with therapy (no surprise with her delirium) - CBC with Differential/Platelet - Basic metabolic panel - Do not attempt  resuscitation (DNR)  2. Pressure injury of sacral region, stage 2 ([place]) - has progressed recently with isolation in facility  due to covid unfortunately so her son has been unable to visit and reports he was getting her up -cont dressings per wound care nurse and Dr. Deforest Hoyles at [facility], discussed specialized mattress and frequent repositioning needed, improved nutrition--Ed had also been helping to feed her - CBC with Differential/Platelet  3. Frailty syndrome in geriatric patient -has advanced considerably -recommend hospice for her quality of life and comfort - CBC with Differential/Platelet - Basic metabolic panel - Do not attempt resuscitation (DNR)  4. Chronic combined systolic and diastolic heart failure (HCC) - appears very dry at present but will not tolerate much fluids and it will only be a short-term solution for her if she is making a decision not to eat (rather than it simply being she's not able to get enough intake), encourage supplement shakes - CBC with Differential/Platelet - Basic metabolic panel   5. Poor fluid intake - due to delirium and nearing end of life, may have some temporary improvement at home with continuous 1:1 assistance and being in home environment, but prognosis still weeks to months  - CBC with Differential/Platelet - Basic metabolic panel  6. Chronic idiopathic constipation - continue current regimen which has been effective  7. Chronic renal disease, stage IV (HCC) -suspect acute on chronic prerenal renal failure at present - Basic metabolic panel -her son is interested in a fluids trial   8. Permanent atrial fibrillation (HCC) - currently stable on her regimen from Dr. Loletha Grayer - CBC with Differential/Platelet - Basic metabolic panel  9. Hyperthyroidism - wanted to check on this b/c it's been hard to regulate and she'd recently been hypo; however, not enough blood could be obtained due to severe hypovolemia - TSH - T3, free - T4,  free  10. Closed nondisplaced intertrochanteric fracture of right femur with delayed healing, subsequent encounter -incision sites look great, but she's not currently at all ambulatory, not even standing and pivoting today--had to be lifted to exam table -had discomfort only during brief change, it appeared  11. Underweight -has been to begin with and this is I'm sure worse though weight could not be obtained as not able to stand   12. Body mass index (BMI) of 19 or less in adult -remains frail and cachectic -cont supplement shakes and see if she has any success eating and drinking when she goes home with her son  11. ACP (advance care planning) -spent 30 mins on ACP -discussed benefits of hospice care for [place] -conclusion was that Ed would like to take her home and care for her there with additional CNA help (suggested [facility]) and possibly a trial of fluids at [facility] first so labs were drawn as best phlebotomist could get them -explained prognosis poor even when she does return home and that delirium may or may not clear -I'm concerned about her comfort and qol  -Ed was not ready for hospice -discussed avoiding hospitalizations and calling here for visits and treatments when she does go home, mgt per snf physician while there  Labs/tests ordered:   Lab Orders     CBC with Differential/Platelet     Basic metabolic panel     TSH     T3, free     T4, free Thyroid labs could not be done--barely enough blood for the cbc, bmp  Next appt:  F/u 1 wk after d/c from [place]. Hiroshi Krummel, D.O. [facility] [address], [place] [address] Cell Phone (  Mon-Fri 8am-5pm):  [phone] On Call:  [phone] & follow prompts after 5pm & weekends Office Phone:  [phone] Office Fax:  [phone]

## 2019-07-22 ENCOUNTER — Encounter: Payer: Self-pay | Admitting: Internal Medicine

## 2019-07-22 ENCOUNTER — Telehealth: Payer: Self-pay | Admitting: *Deleted

## 2019-07-22 ENCOUNTER — Other Ambulatory Visit: Payer: Self-pay

## 2019-07-22 ENCOUNTER — Other Ambulatory Visit: Payer: Self-pay | Admitting: Internal Medicine

## 2019-07-22 ENCOUNTER — Emergency Department (HOSPITAL_COMMUNITY)
Admission: EM | Admit: 2019-07-22 | Discharge: 2019-08-14 | Disposition: E | Payer: Medicare Other | Attending: Emergency Medicine | Admitting: Emergency Medicine

## 2019-07-22 DIAGNOSIS — R0902 Hypoxemia: Secondary | ICD-10-CM | POA: Diagnosis not present

## 2019-07-22 DIAGNOSIS — Z79899 Other long term (current) drug therapy: Secondary | ICD-10-CM | POA: Diagnosis not present

## 2019-07-22 DIAGNOSIS — R404 Transient alteration of awareness: Secondary | ICD-10-CM | POA: Diagnosis not present

## 2019-07-22 DIAGNOSIS — L89152 Pressure ulcer of sacral region, stage 2: Secondary | ICD-10-CM

## 2019-07-22 DIAGNOSIS — R636 Underweight: Secondary | ICD-10-CM

## 2019-07-22 DIAGNOSIS — Z7189 Other specified counseling: Secondary | ICD-10-CM

## 2019-07-22 DIAGNOSIS — R001 Bradycardia, unspecified: Secondary | ICD-10-CM | POA: Diagnosis not present

## 2019-07-22 DIAGNOSIS — D696 Thrombocytopenia, unspecified: Secondary | ICD-10-CM | POA: Diagnosis not present

## 2019-07-22 DIAGNOSIS — Y999 Unspecified external cause status: Secondary | ICD-10-CM | POA: Insufficient documentation

## 2019-07-22 DIAGNOSIS — N179 Acute kidney failure, unspecified: Secondary | ICD-10-CM | POA: Diagnosis not present

## 2019-07-22 DIAGNOSIS — Z7982 Long term (current) use of aspirin: Secondary | ICD-10-CM | POA: Diagnosis not present

## 2019-07-22 DIAGNOSIS — Y92129 Unspecified place in nursing home as the place of occurrence of the external cause: Secondary | ICD-10-CM | POA: Diagnosis not present

## 2019-07-22 DIAGNOSIS — W19XXXA Unspecified fall, initial encounter: Secondary | ICD-10-CM | POA: Diagnosis not present

## 2019-07-22 DIAGNOSIS — Z951 Presence of aortocoronary bypass graft: Secondary | ICD-10-CM | POA: Insufficient documentation

## 2019-07-22 DIAGNOSIS — I2510 Atherosclerotic heart disease of native coronary artery without angina pectoris: Secondary | ICD-10-CM | POA: Diagnosis not present

## 2019-07-22 DIAGNOSIS — I5042 Chronic combined systolic (congestive) and diastolic (congestive) heart failure: Secondary | ICD-10-CM | POA: Insufficient documentation

## 2019-07-22 DIAGNOSIS — J45909 Unspecified asthma, uncomplicated: Secondary | ICD-10-CM | POA: Insufficient documentation

## 2019-07-22 DIAGNOSIS — Y939 Activity, unspecified: Secondary | ICD-10-CM | POA: Diagnosis not present

## 2019-07-22 DIAGNOSIS — S0990XA Unspecified injury of head, initial encounter: Secondary | ICD-10-CM | POA: Diagnosis present

## 2019-07-22 DIAGNOSIS — F05 Delirium due to known physiological condition: Secondary | ICD-10-CM

## 2019-07-22 DIAGNOSIS — N184 Chronic kidney disease, stage 4 (severe): Secondary | ICD-10-CM | POA: Diagnosis not present

## 2019-07-22 DIAGNOSIS — I959 Hypotension, unspecified: Secondary | ICD-10-CM

## 2019-07-22 DIAGNOSIS — F0391 Unspecified dementia with behavioral disturbance: Secondary | ICD-10-CM | POA: Diagnosis not present

## 2019-07-22 DIAGNOSIS — I130 Hypertensive heart and chronic kidney disease with heart failure and stage 1 through stage 4 chronic kidney disease, or unspecified chronic kidney disease: Secondary | ICD-10-CM | POA: Insufficient documentation

## 2019-07-22 DIAGNOSIS — F064 Anxiety disorder due to known physiological condition: Secondary | ICD-10-CM | POA: Diagnosis not present

## 2019-07-22 DIAGNOSIS — R638 Other symptoms and signs concerning food and fluid intake: Secondary | ICD-10-CM

## 2019-07-22 DIAGNOSIS — F339 Major depressive disorder, recurrent, unspecified: Secondary | ICD-10-CM | POA: Diagnosis not present

## 2019-07-22 DIAGNOSIS — S0083XA Contusion of other part of head, initial encounter: Secondary | ICD-10-CM | POA: Diagnosis not present

## 2019-07-22 DIAGNOSIS — Z950 Presence of cardiac pacemaker: Secondary | ICD-10-CM | POA: Insufficient documentation

## 2019-07-22 DIAGNOSIS — R220 Localized swelling, mass and lump, head: Secondary | ICD-10-CM | POA: Diagnosis not present

## 2019-07-22 DIAGNOSIS — E0590 Thyrotoxicosis, unspecified without thyrotoxic crisis or storm: Secondary | ICD-10-CM

## 2019-07-22 DIAGNOSIS — D539 Nutritional anemia, unspecified: Secondary | ICD-10-CM | POA: Diagnosis not present

## 2019-07-22 DIAGNOSIS — Z66 Do not resuscitate: Secondary | ICD-10-CM | POA: Diagnosis not present

## 2019-07-22 DIAGNOSIS — S72144G Nondisplaced intertrochanteric fracture of right femur, subsequent encounter for closed fracture with delayed healing: Secondary | ICD-10-CM

## 2019-07-22 DIAGNOSIS — R54 Age-related physical debility: Secondary | ICD-10-CM

## 2019-07-22 LAB — BASIC METABOLIC PANEL
Anion gap: 13 (ref 5–15)
BUN: 86 mg/dL — ABNORMAL HIGH (ref 8–23)
CO2: 22 mmol/L (ref 22–32)
Calcium: 7.6 mg/dL — ABNORMAL LOW (ref 8.9–10.3)
Chloride: 106 mmol/L (ref 98–111)
Creatinine, Ser: 2.88 mg/dL — ABNORMAL HIGH (ref 0.44–1.00)
GFR calc Af Amer: 16 mL/min — ABNORMAL LOW (ref >60)
GFR calc non Af Amer: 13 mL/min — ABNORMAL LOW (ref >60)
Glucose, Bld: 77 mg/dL (ref 70–99)
Potassium: 4.7 mmol/L (ref 3.5–5.1)
Sodium: 141 mmol/L (ref 135–145)

## 2019-07-22 LAB — CBC WITH DIFFERENTIAL/PLATELET
Abs Immature Granulocytes: 0.05 10*3/uL (ref 0.00–0.07)
Basophils Absolute: 0 10*3/uL (ref 0.0–0.1)
Basophils Relative: 0 %
Eosinophils Absolute: 0 10*3/uL (ref 0.0–0.5)
Eosinophils Relative: 0 %
HCT: 26.2 % — ABNORMAL LOW (ref 36.0–46.0)
Hemoglobin: 8.3 g/dL — ABNORMAL LOW (ref 12.0–15.0)
Immature Granulocytes: 0 %
Lymphocytes Relative: 2 %
Lymphs Abs: 0.3 10*3/uL — ABNORMAL LOW (ref 0.7–4.0)
MCH: 33.6 pg (ref 26.0–34.0)
MCHC: 31.7 g/dL (ref 30.0–36.0)
MCV: 106.1 fL — ABNORMAL HIGH (ref 80.0–100.0)
Monocytes Absolute: 0.2 10*3/uL (ref 0.1–1.0)
Monocytes Relative: 2 %
Neutro Abs: 13.5 10*3/uL — ABNORMAL HIGH (ref 1.7–7.7)
Neutrophils Relative %: 96 %
Platelets: 125 10*3/uL — ABNORMAL LOW (ref 150–400)
RBC: 2.47 MIL/uL — ABNORMAL LOW (ref 3.87–5.11)
RDW: 19.5 % — ABNORMAL HIGH (ref 11.5–15.5)
WBC: 14.1 10*3/uL — ABNORMAL HIGH (ref 4.0–10.5)
nRBC: 0.7 % — ABNORMAL HIGH (ref 0.0–0.2)

## 2019-07-22 MED ORDER — SODIUM CHLORIDE 0.9 % IV BOLUS
1000.0000 mL | Freq: Once | INTRAVENOUS | Status: AC
  Administered 2019-07-22: 1000 mL via INTRAVENOUS

## 2019-07-22 NOTE — Telephone Encounter (Signed)
Patient son, [name], called and stated that patient was seen yesterday. Stated he feels she is going down fast and he is requesting to speak with you directly. Stated that she is severely dehydrated.  Son is urgently requesting to speak with you directly and wants you to call him.  H: [id number] C: [phone]

## 2019-07-22 NOTE — ED Triage Notes (Signed)
Pt arrived via [facility] from [place] [facility] [place], down for unknown time, loc unknown. Pt denies blood thinners. Family denied pt to go to [facility] per EMS. Pt on 3 L humidified at baseline. Per EMS depression on forehead.   20G left FA 200 ml NS given in route   DNR pink and yellow form at bedside  Hx Cog comm deficit, Afib, Pacemaker, CHF

## 2019-07-22 NOTE — Progress Notes (Signed)
Anemia has actually improved; however, her renal function has taken a further decline from her poor intake and delirium.  I recommend hospice care as Ed and I discussed yesterday.  He was wanting to "give her a chance" so she may need fluids at [facility]--I will leave this to the discretion of her current overseeing physician (typically we don't see folks in the office until they are discharged from rehab).  Please fax this result with my comments on it to [facility], Attention, Dr. Deforest Hoyles.  Thanks.

## 2019-07-22 NOTE — Telephone Encounter (Signed)
Spoke with Ed.  He feels she needs the fluids urgently.  He also is agreeable to hospice for her after that attempt.  I attempted to contact Dr. Sherilyn Cooter office--Ed had given me the number ([facility]).  Unfortunately, he is out of town and the office does not have access to records related to [place] due to her being at [facility].  The lady with whom I spoke said that a colleague is covering him, but may be at a loss without the records at hand.  I will now call [facility]--called the main number [phone] which rang for at least one minute.    At this point, I will put in the hospice referral urgently through epic.

## 2019-07-22 NOTE — Progress Notes (Signed)
Hospice referral entered urgently.

## 2019-07-22 NOTE — ED Provider Notes (Addendum)
[facility] DEPT Provider Note   CSN: [id number] Arrival date & time: 08/05/2019  2231   History Chief complaint: Fall  Holly Bolton is a 86 y.o. female.  The history is provided by the EMS personnel. The history is limited by the condition of the patient (Dementia).  She has history of hypertension, chronic kidney disease, atrial fibrillation, dementia and was transferred from a skilled nursing facility after an unwitnessed fall.  It is not known how long she had been on the floor, unknown if she had loss of consciousness.  Patient is nonverbal and I am not able to get any history from her.  Past Medical History:  Diagnosis Date   Aneurysm (arteriovenous) of coronary vessels    ascending aorta requiring bypass of the LAD   Carotid stenosis 04/28/08   Doppler: <40% stenosis bilateral   Chronic kidney disease, stage 3    Hyperlipemia    Hypertension    Hypertensive chronic kidney disease    Hypothyroidism, unspecified 02/13/2018   Symptoms well controlled with current therapy   Mild cognitive impairment, so stated 03/28/2018   Symptoms poorly controlled, needs frequent adjustments in treatment and dose monitoring   Mild intermittent asthma    Symptoms controlled with difficulty, affecting daily functioning, needs ongoing monitoring   Occlusion and stenosis of bilateral carotid arteries    Pacemaker generator end of life 11/18/08   Intermittent high-grade atrioventricular block   Peripheral arterial disease (HCC)    left ABI of 0.78   Presence of permanent cardiac pacemaker 08/26/01   Sinus node dysfunction-St.Jude   Presence of xenogenic heart valve    PVD (peripheral vascular disease) (HCC)    SSS (sick sinus syndrome) (HCC)    Status post ascending aortic aneurysm repair/AVR -  Medtronic Freestyle root 09/28/2014   ascending aorta requiring bypass of the LAD     Patient Active Problem List   Diagnosis Date Noted   Malnutrition  of moderate degree 06/24/2019   Closed fracture of right hip (HCC)    Preoperative clearance    Closed intertrochanteric fracture of right hip (HCC) 40/98/1191   Chronic systolic CHF (congestive heart failure) ([place]) 06/18/2019   Acute kidney injury superimposed on CKD ([place]) 06/18/2019   Adult failure to thrive 06/18/2019   CHB (complete heart block) ([place])    Secondary hypercoagulable state ([place]) 04/24/2019   Atypical atrial flutter (HCC)    Acute on chronic congestive heart failure ([place]) 01/22/2019   Chronic kidney disease, stage 3    Persistent atrial fibrillation (HCC)    Macrocytic anemia    Acute on chronic diastolic CHF (congestive heart failure) ([place]) 10/11/2018   Chronic kidney disease, stage 4 (severe) ([place]) 09/26/2018   Intermittent claudication ([place]) 06/27/2018   Mild intermittent asthma without complication 47/82/9562   Senile osteoporosis 06/27/2018   Bilateral bunions 03/28/2018   Hyperthyroidism 03/20/2018   Weight loss, unintentional 02/11/2018   H/O hyperthyroidism 02/11/2018   Memory loss, short term 02/11/2018   Frailty syndrome in geriatric patient 02/11/2018   Urge incontinence of urine 02/11/2018   S/p nephrectomy 02/11/2018   B12 deficiency 02/11/2018   Elevated troponin    Asthma 08/15/2017   Open left humeral fracture 08/15/2017   Fracture 08/15/2017   Pacemaker battery depletion 05/17/2017   Leg cramps 06/12/2016   Peripheral arterial disease ([place]) 12/08/2014   Status post ascending aortic aneurysm repair/AVR -  Medtronic Freestyle root 09/28/2014   CAD s/p CABG (LIMA to LAD) 07/08/2013   History  of aortic root repairand bioprosthetic AVR 07/08/2013   Hyperlipidemia 07/08/2013   SSS (sick sinus syndrome) ([place]) 07/08/2013   Second degree AV block 07/08/2013   Pacemaker, dual chamber St. Jude 2003/2010/2019 07/08/2013   Essential hypertension 07/08/2013    Past Surgical History:  Procedure Laterality Date     ABDOMINAL HYSTERECTOMY  1966   APPENDECTOMY  1943   ASCENDING AORTIC ANEURYSM REPAIR  09/22/97   porcine aortic root   BREAST FIBROADENOMA SURGERY  11/89   CARDIAC SURGERY  09/22/1997   aortic valve root repair with porcine at time of CABG   CARDIOVERSION N/A 02/10/2019   Procedure: CARDIOVERSION;  Surgeon: Skeet Latch, MD;  Location: [facility];  Service: Cardiovascular;  Laterality: N/A;   CORONARY ARTERY BYPASS GRAFT  09/22/97   LIMA to the LAD   I & D EXTREMITY Left 08/15/2017   Procedure: IRRIGATION AND DEBRIDEMENT EXTREMITY;  Surgeon: Nicholes Stairs, MD;  Location: [facility];  Service: Orthopedics;  Laterality: Left;   INTRAMEDULLARY (IM) NAIL INTERTROCHANTERIC Right 06/19/2019   Procedure: INTRAMEDULLARY (IM) NAIL INTERTROCHANTRIC;  Surgeon: Tania Ade, MD;  Location: [facility];  Service: Orthopedics;  Laterality: Right;   [place]   left   NEPHRECTOMY  1958   right   ORIF HUMERUS FRACTURE Left 08/15/2017   Procedure: OPEN REDUCTION INTERNAL FIXATION (ORIF) PROXIMAL HUMERUS FRACTURE;  Surgeon: Nicholes Stairs, MD;  Location: [facility];  Service: Orthopedics;  Laterality: Left;   OVARIAN CYST SURGERY  1948   PACEMAKER GENERATOR CHANGE  11/18/08   St.Jude   PACEMAKER INSERTION  08/26/01   St.Jude   PPM GENERATOR CHANGEOUT N/A 05/16/2017   Procedure: PPM GENERATOR CHANGEOUT;  Surgeon: Sanda Klein, MD;  Location: [facility];  Service: Cardiovascular;  Laterality: N/A;     OB History   No obstetric history on file.     Family History  Problem Relation Age of Onset   Heart attack Mother    Heart attack Father    Heart attack Brother    Hyperlipidemia Sister    Hypertension Sister     Social History   Tobacco Use   Smoking status: Never Smoker   Smokeless tobacco: Never Used  Substance Use Topics   Alcohol use: No   Drug use: No    Home Medications Prior to Admission medications   Medication Sig  Start Date End Date Taking? Authorizing Provider  acetaminophen (TYLENOL) 500 MG tablet Take 2 tablets (1,000 mg total) by mouth every 8 (eight) hours. 06/24/19   Grier Mitts, PA-C  amiodarone (PACERONE) 200 MG tablet TAKE ONE TABLET BY MOUTH TWICE A DAY FOR 1 WEEK THEN REDUCE TO 1 TABLET DAILY THEREAFTER 06/12/19   Croitoru, Mihai, MD  aspirin 325 MG tablet Take 1 tablet (325 mg total) by mouth daily. 06/24/19   Amin, Jeanella Flattery, MD  aspirin 81 MG chewable tablet Chew 81 mg by mouth daily.    [provider]  brimonidine (ALPHAGAN) 0.2 % ophthalmic solution Place 1 drop into both eyes 2 (two) times daily.  03/31/16   [provider]  carvedilol (COREG) 3.125 MG tablet TAKE ONE TABLET BY MOUTH TWICE A DAY WITH FOOD 03/24/19   Reed, Tiffany L, DO  Cholecalciferol (VITAMIN D) 50 MCG (2000 UT) CAPS Take 2,000 Units by mouth at bedtime.     [provider]  Coenzyme Q10 (COQ10) 200 MG CAPS Take 200 mg by mouth daily.    [provider]  diphenhydramine-acetaminophen (TYLENOL PM) 25-500 MG TABS tablet Take 0.5 tablets by mouth at bedtime as needed (sleep).    [provider]  docusate sodium (COLACE) 100 MG capsule Take 1 capsule (100 mg total) by mouth 2 (two) times daily. 06/24/19   Amin, Jeanella Flattery, MD  furosemide (LASIX) 20 MG tablet Take 1 tablet (20 mg total) by mouth daily. 03/25/19   Reed, Tiffany L, DO  lidocaine (LIDODERM) 5 % Place 1 patch onto the skin daily as needed (pain). Remove & Discard patch within 12 hours or as directed by MD    [provider]  lidocaine (LMX) 4 % cream Apply 1 application topically daily as needed (pain).    [provider]  polyethylene glycol (MIRALAX / GLYCOLAX) 17 g packet Take 17 g by mouth daily as needed for mild constipation. 06/24/19   Amin, Jeanella Flattery, MD  vitamin B-12 (CYANOCOBALAMIN) 500 MCG tablet Take 500 mcg by mouth daily.    [provider]    Allergies    Accupril  [quinapril hcl], Amlodipine, Benadryl [diphenhydramine hcl], Biaxin [clarithromycin], Ciprofloxacin, Codeine, Diovan [valsartan], Medrol [methylprednisolone], Morphine and related, Neurontin [gabapentin], and Penicillins  Review of Systems   Review of Systems  Unable to perform ROS: Dementia    Physical Exam Updated Vital Signs BP (!) 85/32    Pulse 60    SpO2 100%   Physical Exam Vitals and nursing note reviewed.   86 year old female, resting comfortably and in no acute distress. Vital signs are significant for low blood pressure. Oxygen saturation is 100%, which is normal. Head is normocephalic.  Ecchymosis and minor skin tear noted of the forehead. PERRLA, EOMI. Neck is nontender without adenopathy or JVD. Back is nontender and there is no CVA tenderness. Lungs are clear without rales, wheezes, or rhonchi. Chest is nontender. Heart has regular rate and rhythm without murmur. Abdomen is soft, flat, nontender without masses or hepatosplenomegaly and peristalsis is normoactive. Extremities have flexion contractures. Skin is warm and dry without rash. Neurologic: Somnolent but responds to painful stimuli.  Minimal movement in all extremities.    ED Results / Procedures / Treatments   Labs (all labs ordered are listed, but only abnormal results are displayed) Labs Reviewed  CBC WITH DIFFERENTIAL/PLATELET - Abnormal; Notable for the following components:      Result Value   WBC 14.1 (*)    RBC 2.47 (*)    Hemoglobin 8.3 (*)    HCT 26.2 (*)    MCV 106.1 (*)    RDW 19.5 (*)    Platelets 125 (*)    nRBC 0.7 (*)    Neutro Abs 13.5 (*)    Lymphs Abs 0.3 (*)    All other components within normal limits  BASIC METABOLIC PANEL - Abnormal; Notable for the following components:   BUN 86 (*)    Creatinine, Ser 2.88 (*)    Calcium 7.6 (*)    GFR calc non Af Amer 13 (*)    GFR calc Af Amer 16 (*)    All other components within normal limits    Radiology CT Head Wo  Contrast  Result Date: 2019-07-29 CLINICAL DATA:  Unwitnessed fall EXAM: CT HEAD WITHOUT CONTRAST CT CERVICAL SPINE WITHOUT CONTRAST TECHNIQUE: Multidetector CT imaging of the head and cervical spine was performed following the standard protocol without intravenous contrast. Multiplanar CT image reconstructions of the cervical spine were also generated. COMPARISON:  CT head and cervical spine 06/18/2019 FINDINGS: CT HEAD FINDINGS  Brain: Stable foci of cortical and subcortical encephalomalacia in the right frontal lobe and left parietal lobe compatible with remote infarct. Additional remote lacunar infarcts seen in the left cerebellum, right caudate, and anterior left internal capsule. No evidence of acute infarction, hemorrhage, hydrocephalus, extra-axial collection or mass lesion/mass effect. Symmetric prominence of the ventricles, cisterns and sulci compatible with moderate parenchymal volume loss. Patchy areas of white matter hypoattenuation are most compatible with chronic microvascular angiopathy. Vascular: Atherosclerotic calcification of the carotid siphons and intradural vertebral arteries. No hyperdense vessel. Skull: Frontal scalp thickening and swelling with possible laceration. No subjacent calvarial fracture. No worrisome osseous lesions. Sinuses/Orbits: Paranasal sinuses and mastoid air cells are predominantly clear. Orbital structures are unremarkable aside from prior lens extractions. Other: Bilateral TMJ arthrosis. CT CERVICAL SPINE FINDINGS Alignment: Stabilization collar absent at the time of examination. Exaggerated thoracic kyphosis with slight extension of the upper cervical spine seen on scout imaging. There is straightening of normal cervical lordosis. Stepwise anterolisthesis C2-C5 is unchanged from comparison and likely on a degenerative basis. No evidence of traumatic listhesis. No abnormally widened, perched or jumped facets. Normal alignment of the craniocervical and atlantoaxial  articulations. Skull base and vertebrae: Arthrosis about the C1-2 articulation. Calcific pannus posterior to the dens. Multilevel intervertebral disc height loss with spondylitic endplate changes. Additional multilevel uncinate spurring and facet hypertrophic changes. No acute skull base or vertebral fracture, height loss or suspicious osseous lesion. The osseous structures appear diffusely demineralized which may limit detection of small or nondisplaced fractures. Soft tissues and spinal canal: No pre or paravertebral fluid or swelling. No visible canal hematoma. Disc levels: Mild to moderate canal stenosis C5-6, C6-7 secondary to disc osteophyte complexes in ligamentum flavum redundancy. Additional mild narrowing at the C3-4 level as well. Uncinate spurring and facet changes result in mild-to-moderate multilevel neural foraminal narrowing with more severe narrowing bilaterally C5-6 and on the right at C6-7. Upper chest: Bilateral pleural effusions, left greater than right. Biapical pleuroparenchymal scarring. Extensive calcification of the aortic arch and proximal great vessels. Postsurgical changes from prior sternotomy. Pacer pack in the soft tissues of the right chest wall. Other: Heterogeneous, multinodular thyroid gland. Decision to pursue further imaging evaluation with thyroid ultrasound should be based upon clinical decision making and consideration of patient's comorbidities in advanced age. IMPRESSION: 1. Frontal scalp thickening and swelling with possible laceration. Correlate with exam findings. No subjacent calvarial fracture or acute intracranial abnormality. 2. Sequela of multiple prior cortically based and lacunar infarcts, similar to comparison. 3. Chronic microvascular angiopathy and parenchymal loss. 4. No evidence of acute fracture or traumatic listhesis of the cervical spine. 5. Multilevel degenerative disc disease and facet hypertrophic changes of the cervical spine, as described. 6.  Bilateral pleural effusions, left greater than right. 7. Heterogeneous, multinodular thyroid gland. Decision to pursue further imaging evaluation with thyroid ultrasound should be based upon clinical to pursue further imaging evaluation with clinical further imaging evaluation with thyroid ultrasound should be based upon clinical and consideration of patient's comorbidities in advanced age. This follows consensus guidelines: Managing Incidental Thyroid Nodules Detected on Imaging: White Paper of the ACR Incidental Thyroid Findings Committee. J Am Coll Radiol 2015; 12:143-150. and [facility] 3-tiered system for managing ITNs: J Am Coll Radiol. 2015; Feb;12(2): 143-50 8. Aortic Atherosclerosis (ICD10-I70.0). Electronically Signed   By: Lovena Le M.D.   On: July 30, 2019 01:02   CT Cervical Spine Wo Contrast  Result Date: 2019-07-30 CLINICAL DATA:  Unwitnessed fall EXAM: CT HEAD WITHOUT CONTRAST CT CERVICAL SPINE  WITHOUT CONTRAST TECHNIQUE: Multidetector CT imaging of the head and cervical spine was performed following the standard protocol without intravenous contrast. Multiplanar CT image reconstructions of the cervical spine were also generated. COMPARISON:  CT head and cervical spine 06/18/2019 FINDINGS: CT HEAD FINDINGS Brain: Stable foci of cortical and subcortical encephalomalacia in the right frontal lobe and left parietal lobe compatible with remote infarct. Additional remote lacunar infarcts seen in the left cerebellum, right caudate, and anterior left internal capsule. No evidence of acute infarction, hemorrhage, hydrocephalus, extra-axial collection or mass lesion/mass effect. Symmetric prominence of the ventricles, cisterns and sulci compatible with moderate parenchymal volume loss. Patchy areas of white matter hypoattenuation are most compatible with chronic microvascular angiopathy. Vascular: Atherosclerotic calcification of the carotid siphons and intradural vertebral arteries. No hyperdense vessel. Skull:  Frontal scalp thickening and swelling with possible laceration. No subjacent calvarial fracture. No worrisome osseous lesions. Sinuses/Orbits: Paranasal sinuses and mastoid air cells are predominantly clear. Orbital structures are unremarkable aside from prior lens extractions. Other: Bilateral TMJ arthrosis. CT CERVICAL SPINE FINDINGS Alignment: Stabilization collar absent at the time of examination. Exaggerated thoracic kyphosis with slight extension of the upper cervical spine seen on scout imaging. There is straightening of normal cervical lordosis. Stepwise anterolisthesis C2-C5 is unchanged from comparison and likely on a degenerative basis. No evidence of traumatic listhesis. No abnormally widened, perched or jumped facets. Normal alignment of the craniocervical and atlantoaxial articulations. Skull base and vertebrae: Arthrosis about the C1-2 articulation. Calcific pannus posterior to the dens. Multilevel intervertebral disc height loss with spondylitic endplate changes. Additional multilevel uncinate spurring and facet hypertrophic changes. No acute skull base or vertebral fracture, height loss or suspicious osseous lesion. The osseous structures appear diffusely demineralized which may limit detection of small or nondisplaced fractures. Soft tissues and spinal canal: No pre or paravertebral fluid or swelling. No visible canal hematoma. Disc levels: Mild to moderate canal stenosis C5-6, C6-7 secondary to disc osteophyte complexes in ligamentum flavum redundancy. Additional mild narrowing at the C3-4 level as well. Uncinate spurring and facet changes result in mild-to-moderate multilevel neural foraminal narrowing with more severe narrowing bilaterally C5-6 and on the right at C6-7. Upper chest: Bilateral pleural effusions, left greater than right. Biapical pleuroparenchymal scarring. Extensive calcification of the aortic arch and proximal great vessels. Postsurgical changes from prior sternotomy. Pacer pack  in the soft tissues of the right chest wall. Other: Heterogeneous, multinodular thyroid gland. Decision to pursue further imaging evaluation with thyroid ultrasound should be based upon clinical decision making and consideration of patient's comorbidities in advanced age. IMPRESSION: 1. Frontal scalp thickening and swelling with possible laceration. Correlate with exam findings. No subjacent calvarial fracture or acute intracranial abnormality. 2. Sequela of multiple prior cortically based and lacunar infarcts, similar to comparison. 3. Chronic microvascular angiopathy and parenchymal loss. 4. No evidence of acute fracture or traumatic listhesis of the cervical spine. 5. Multilevel degenerative disc disease and facet hypertrophic changes of the cervical spine, as described. 6. Bilateral pleural effusions, left greater than right. 7. Heterogeneous, multinodular thyroid gland. Decision to pursue further imaging evaluation with thyroid ultrasound should be based upon clinical to pursue further imaging evaluation with clinical further imaging evaluation with thyroid ultrasound should be based upon clinical and consideration of patient's comorbidities in advanced age. This follows consensus guidelines: Managing Incidental Thyroid Nodules Detected on Imaging: White Paper of the ACR Incidental Thyroid Findings Committee. J Am Coll Radiol 2015; 12:143-150. and [facility] 3-tiered system for managing ITNs: J Am Coll Radiol. 2015;  Feb;12(2): 143-50 8. Aortic Atherosclerosis (ICD10-I70.0). Electronically Signed   By: Lovena Le M.D.   On: 07-29-2019 01:02    Procedures Procedures  CRITICAL CARE Performed by: Delora Fuel Total critical care time: 60 minutes Critical care time was exclusive of separately billable procedures and treating other patients. Critical care was necessary to treat or prevent imminent or life-threatening deterioration. Critical care was time spent personally by me on the following activities:  development of treatment plan with patient and/or surrogate as well as nursing, discussions with consultants, evaluation of patient's response to treatment, examination of patient, obtaining history from patient or surrogate, ordering and performing treatments and interventions, ordering and review of laboratory studies, ordering and review of radiographic studies, pulse oximetry and re-evaluation of patient's condition.  Medications Ordered in ED Medications  sodium chloride 0.9 % bolus 1,000 mL (1,000 mLs Intravenous New Bag/Given 07/22/2019 2337)  sodium chloride 0.9 % bolus 1,000 mL (1,000 mLs Intravenous New Bag/Given 2019/07/29 0109)    ED Course  I have reviewed the triage vital signs and the nursing notes.  Pertinent labs & imaging results that were available during my care of the patient were reviewed by me and considered in my medical decision making (see chart for details).  MDM Rules/Calculators/A&P Fall with forehead contusion.  Hypotension.  Old records are reviewed, and it is noted that she was placed on hospice today and had been noted to be in declining health recently, and there had been concerns about dehydration.  She will be sent for CT of head and cervical spine, will give IV fluids and check screening labs.  She does have DNR paperwork with her.  1:04 AM Labs show a drop in hemoglobin from yesterday, but hemoglobin is essentially unchanged from 1 month ago.  Creatinine has risen significantly.  In spite of IV fluids, blood pressure is actually dropped.  She will be given additional IV fluids.  She is now showing preagonal respirations.  Her son is at bedside and I have explained the seriousness of her condition and the likelihood that she is actively dying.  Given DNR status with general decline recently, no additional work-up will be done.  1:15 AM CT of head and cervical spine showed no intracranial injury, no C-spine fracture.  Case is discussed with Dr. Hal Hope of [facility], who agrees to admit the patient.  Final Clinical Impression(s) / ED Diagnoses Final diagnoses:  Hypotension, unspecified hypotension type  Fall at nursing home, initial encounter  Forehead contusion, initial encounter  Acute kidney injury (nontraumatic) ([place])  Macrocytic anemia  Thrombocytopenia ([place])  DNR (do not resuscitate)    Rx / DC Orders ED Discharge Orders    None       Delora Fuel, MD 41/32/44 0122  Blood pressure failed to respond to IV fluids.  Respirations became gradually slower and agonal.  She was then noted to have stopped breathing.  There were no heart tones.  She was pronounced dead at 0235.  Patient's son was at the bedside.   Delora Fuel, MD 02/14/70 [id number]  Death certificate filled out.   Delora Fuel, MD 44/[birth year]47 [phone]

## 2019-07-23 ENCOUNTER — Emergency Department (HOSPITAL_COMMUNITY): Payer: Medicare Other

## 2019-07-23 DIAGNOSIS — S0083XA Contusion of other part of head, initial encounter: Secondary | ICD-10-CM | POA: Diagnosis not present

## 2019-07-23 DIAGNOSIS — R220 Localized swelling, mass and lump, head: Secondary | ICD-10-CM | POA: Diagnosis not present

## 2019-07-23 MED ORDER — SODIUM CHLORIDE 0.9 % IV BOLUS
1000.0000 mL | Freq: Once | INTRAVENOUS | Status: AC
  Administered 2019-07-23: 1000 mL via INTRAVENOUS

## 2019-08-14 DEATH — deceased

## 2019-08-25 ENCOUNTER — Encounter: Payer: Medicare Other | Admitting: Cardiovascular Disease

## 2019-12-18 ENCOUNTER — Telehealth: Payer: Self-pay | Admitting: Internal Medicine

## 2019-12-18 NOTE — Telephone Encounter (Signed)
Pt would like to schedule consultation on some closure to his moms sudden death.  Please advise Kathyrn Lass

## 2019-12-26 ENCOUNTER — Telehealth: Payer: Self-pay | Admitting: Internal Medicine

## 2019-12-26 NOTE — Telephone Encounter (Signed)
Holly Bolton called to follow up on possible phone call or consultation with Dr Mariea Clonts for closure on mothers death.  Thanks, [name]

## 2020-12-28 IMAGING — CT CT HEAD W/O CM
3 series · 14 of 47 positions shown, 16 images · non-contrast
Comparison: CT head and cervical spine 06/18/2019

CLINICAL DATA: Unwitnessed fall

EXAM:
CT HEAD WITHOUT CONTRAST
CT CERVICAL SPINE WITHOUT CONTRAST
TECHNIQUE: Multidetector CT imaging of the head and cervical spine was
performed following the standard protocol without intravenous
contrast. Multiplanar CT image reconstructions of the cervical spine
were also generated.

[Series 3: head wo · axial · 0.47mm/px · z∈[-134,-9]mm · 8 of 30 slices shown, 10 images]
[im 3/30  brain]
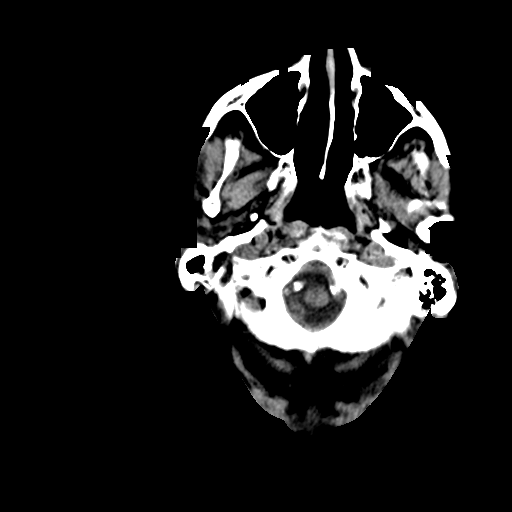
[im 3/30  bone]
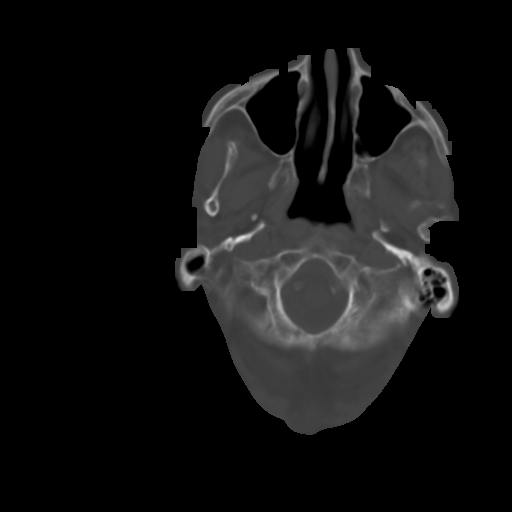
[im 7/30  brain]
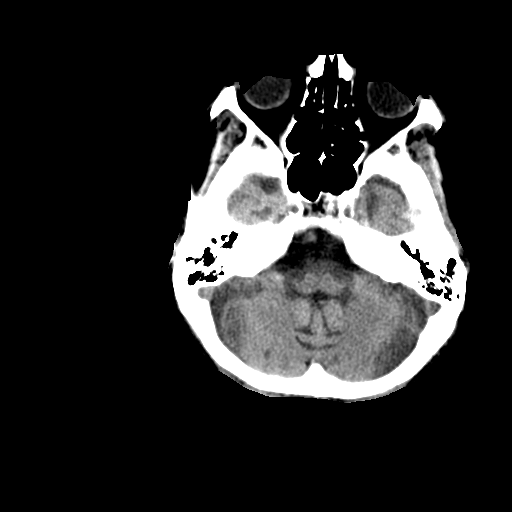
[im 10/30  brain]
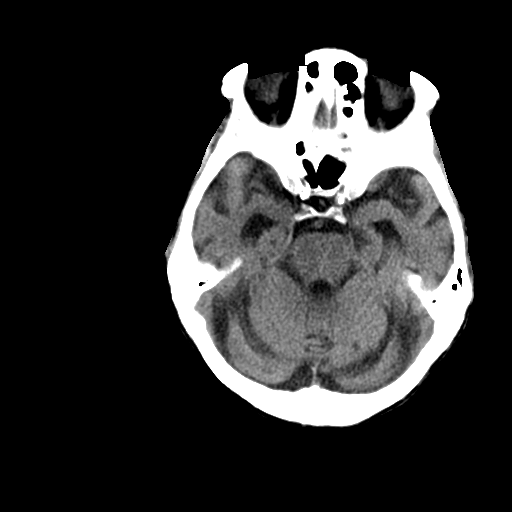
[im 14/30  brain]
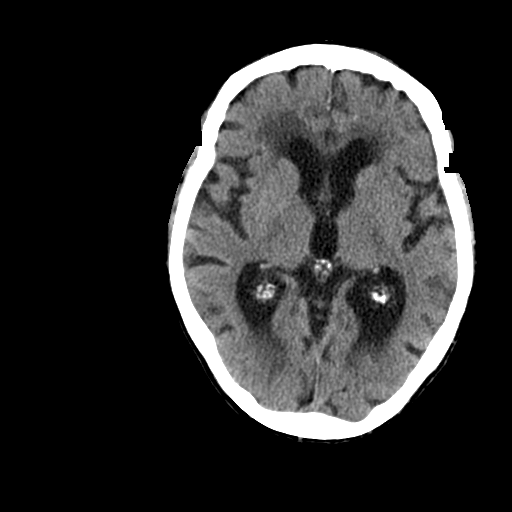
[im 17/30  brain]
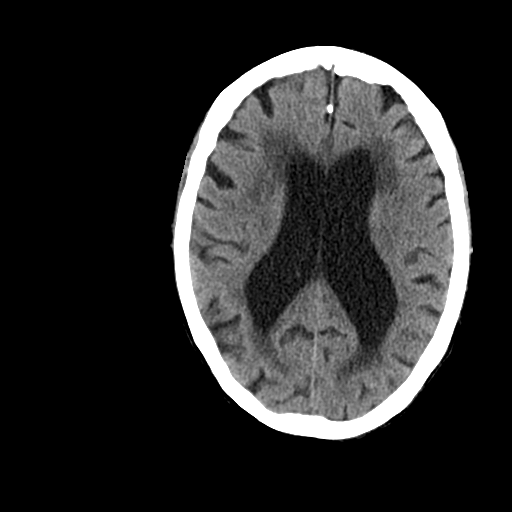
[im 17/30  bone]
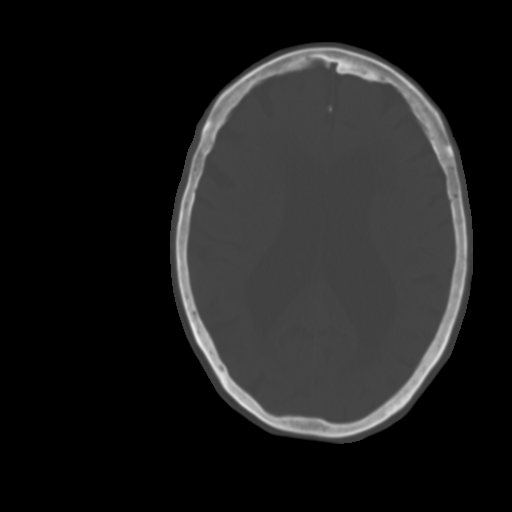
[im 21/30  brain]
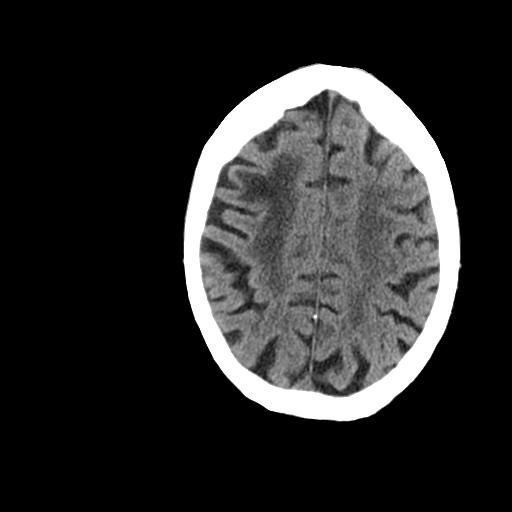
[im 24/30  brain]
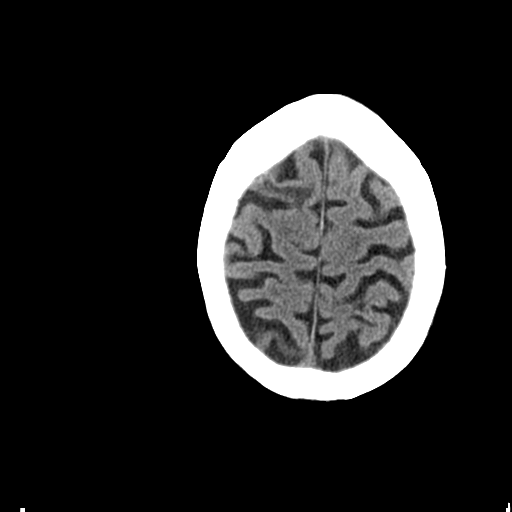
[im 28/30  brain]
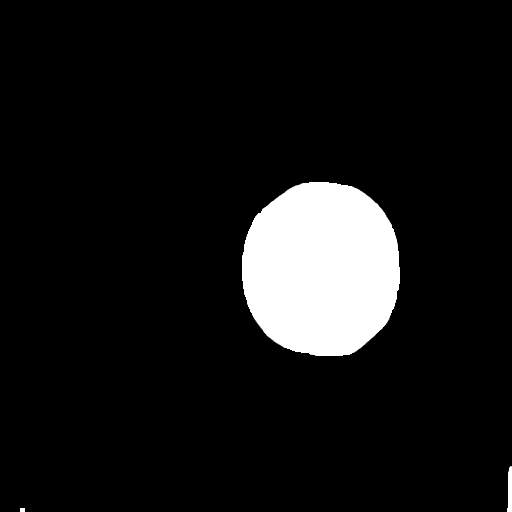

[Series 5: coronal soft tissue · coronal · 0.29mm/px · 3 of 65 slices shown]
[im 22/65  brain]
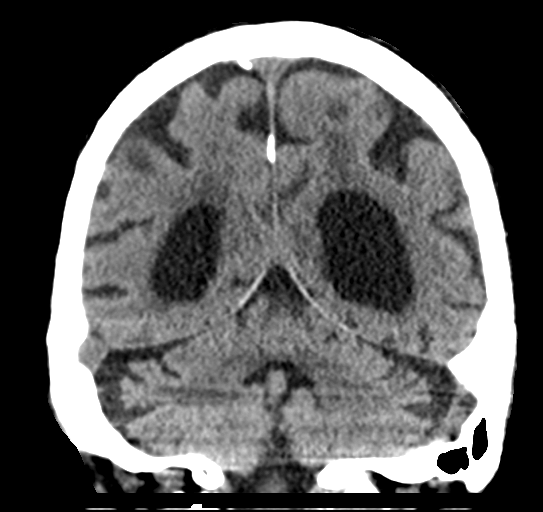
[im 29/65  brain]
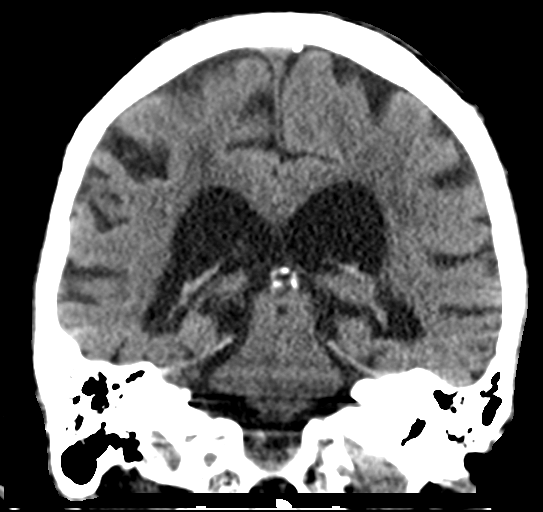
[im 36/65  brain]
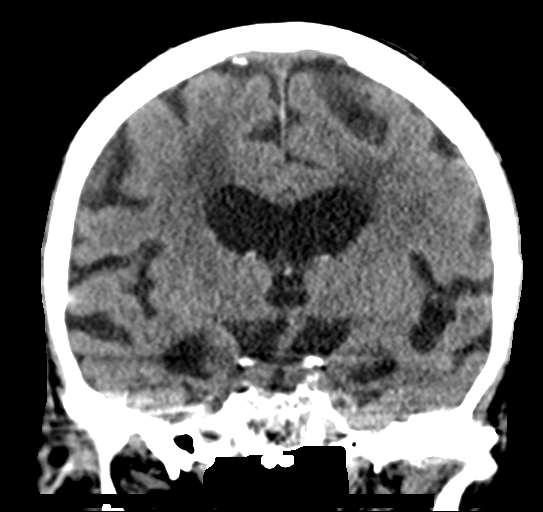

[Series 6: sagittal soft tissue · sagittal · 0.31mm/px · 3 of 50 slices shown]
[im 17/50  brain]
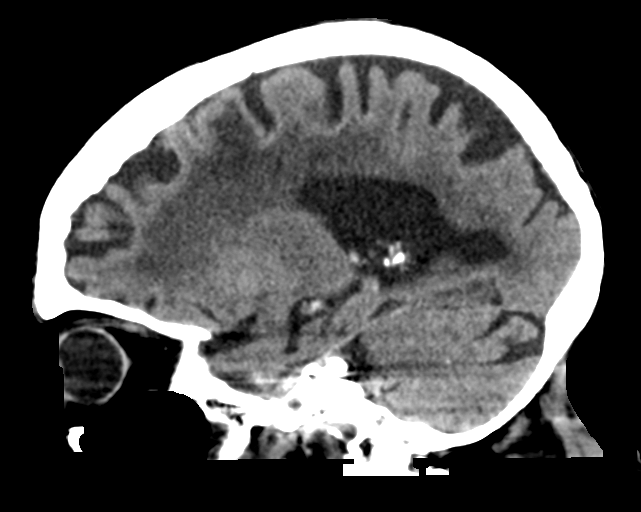
[im 25/50  brain]
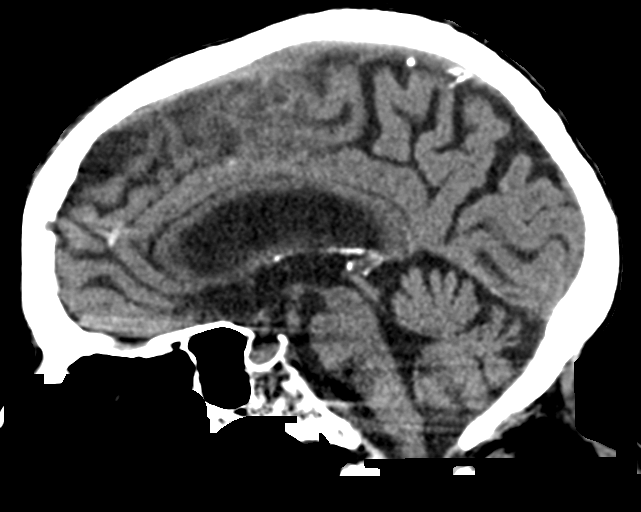
[im 33/50  brain]
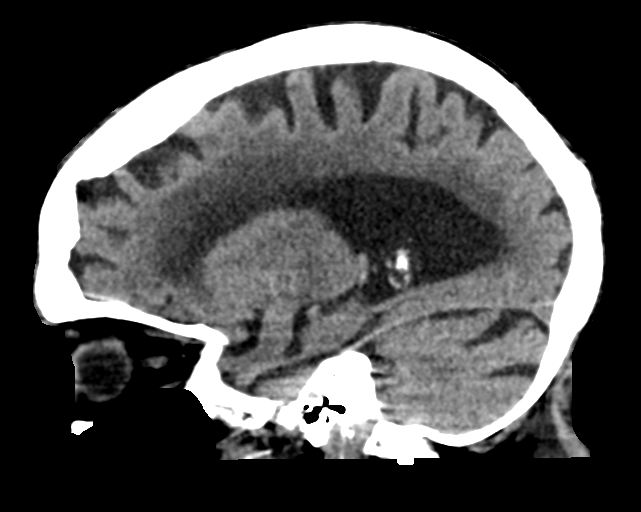

[14 of 47 positions shown; findings below may reference images not displayed]

FINDINGS: CT HEAD FINDINGS

Brain: Stable foci of cortical and subcortical encephalomalacia in
the right frontal lobe and left parietal lobe compatible with remote
infarct. Additional remote lacunar infarcts seen in the left
cerebellum, right caudate, and anterior left internal capsule. No
evidence of acute infarction, hemorrhage, hydrocephalus, extra-axial
collection or mass lesion/mass effect. Symmetric prominence of the
ventricles, cisterns and sulci compatible with moderate parenchymal
volume loss. Patchy areas of white matter hypoattenuation are most
compatible with chronic microvascular angiopathy.

Vascular: Atherosclerotic calcification of the carotid siphons and
intradural vertebral arteries. No hyperdense vessel.

Skull: Frontal scalp thickening and swelling with possible
laceration. No subjacent calvarial fracture. No worrisome osseous
lesions.

Sinuses/Orbits: Paranasal sinuses and mastoid air cells are
predominantly clear. Orbital structures are unremarkable aside from
prior lens extractions.

Other: Bilateral TMJ arthrosis.

CT CERVICAL SPINE FINDINGS

Alignment: Stabilization collar absent at the time of examination.
Exaggerated thoracic kyphosis with slight extension of the upper
cervical spine seen on [facility] imaging. There is straightening of
normal cervical lordosis. Stepwise anterolisthesis C2-C5 is
unchanged from comparison and likely on a degenerative basis. No
evidence of traumatic listhesis. No abnormally widened, perched or
jumped facets. Normal alignment of the craniocervical and
atlantoaxial articulations.

Skull base and vertebrae: Arthrosis about the C1-2 articulation.
Calcific pannus posterior to the dens. Multilevel intervertebral
disc height loss with spondylitic endplate changes. Additional
multilevel uncinate spurring and facet hypertrophic changes. No
acute skull base or vertebral fracture, height loss or suspicious
osseous lesion. The osseous structures appear diffusely
demineralized which may limit detection of small or nondisplaced
fractures.

Soft tissues and spinal canal: No pre or paravertebral fluid or
swelling. No visible canal hematoma.

Disc levels: Mild to moderate canal stenosis C5-6, C6-7 secondary to
disc osteophyte complexes in ligamentum flavum redundancy.
Additional mild narrowing at the C3-4 level as well. Uncinate
spurring and facet changes result in mild-to-moderate multilevel
neural foraminal narrowing with more severe narrowing bilaterally
C5-6 and on the right at C6-7.

Upper chest: Bilateral pleural effusions, left greater than right.
Biapical pleuroparenchymal scarring. Extensive calcification of the
aortic arch and proximal great vessels. Postsurgical changes from
prior sternotomy. Pacer pack in the soft tissues of the right chest
wall.

Other: Heterogeneous, multinodular thyroid gland. Decision to pursue
further imaging evaluation with thyroid ultrasound should be based
upon clinical decision making and consideration of patient's
comorbidities in advanced age.
IMPRESSION: 1. Frontal scalp thickening and swelling with possible laceration.
Correlate with exam findings. No subjacent calvarial fracture or
acute intracranial abnormality.
2. Sequela of multiple prior cortically based and lacunar infarcts,
similar to comparison.
3. Chronic microvascular angiopathy and parenchymal loss.
4. No evidence of acute fracture or traumatic listhesis of the
cervical spine.
5. Multilevel degenerative disc disease and facet hypertrophic
changes of the cervical spine, as described.
6. Bilateral pleural effusions, left greater than right.
7. Heterogeneous, multinodular thyroid gland. Decision to pursue
further imaging evaluation with thyroid ultrasound should be based
upon clinical to pursue further imaging evaluation with clinical
further imaging evaluation with thyroid ultrasound should be based
upon clinical and consideration of patient's comorbidities in
advanced age. This follows consensus guidelines: Managing Incidental
Thyroid Nodules Detected on Imaging: White Paper of [REDACTED]. [HOSPITAL] 3375;
[DATE]. and Dianne 3-tiered system for managing ITNs: [HOSPITAL]. [DATE]): 143-50
8. Aortic Atherosclerosis ([id number]).
